# Patient Record
Sex: Female | Born: 1955
Health system: Southern US, Community
[De-identification: ages and names within clinical notes are randomized; demographics above are authoritative.]

## PROBLEM LIST (undated history)

## (undated) DIAGNOSIS — I341 Nonrheumatic mitral (valve) prolapse: Secondary | ICD-10-CM

## (undated) DIAGNOSIS — E079 Disorder of thyroid, unspecified: Secondary | ICD-10-CM

## (undated) DIAGNOSIS — M1990 Unspecified osteoarthritis, unspecified site: Secondary | ICD-10-CM

## (undated) DIAGNOSIS — Z298 Encounter for other specified prophylactic measures: Secondary | ICD-10-CM

## (undated) DIAGNOSIS — I351 Nonrheumatic aortic (valve) insufficiency: Secondary | ICD-10-CM

## (undated) DIAGNOSIS — G4730 Sleep apnea, unspecified: Secondary | ICD-10-CM

## (undated) DIAGNOSIS — R110 Nausea: Secondary | ICD-10-CM

## (undated) DIAGNOSIS — R112 Nausea with vomiting, unspecified: Secondary | ICD-10-CM

## (undated) DIAGNOSIS — G5600 Carpal tunnel syndrome, unspecified upper limb: Secondary | ICD-10-CM

## (undated) DIAGNOSIS — I340 Nonrheumatic mitral (valve) insufficiency: Secondary | ICD-10-CM

## (undated) DIAGNOSIS — L0591 Pilonidal cyst without abscess: Secondary | ICD-10-CM

## (undated) DIAGNOSIS — N189 Chronic kidney disease, unspecified: Secondary | ICD-10-CM

## (undated) DIAGNOSIS — H269 Unspecified cataract: Secondary | ICD-10-CM

## (undated) DIAGNOSIS — Z9885 Transplanted organ removal status: Secondary | ICD-10-CM

## (undated) DIAGNOSIS — G43909 Migraine, unspecified, not intractable, without status migrainosus: Secondary | ICD-10-CM

## (undated) DIAGNOSIS — C801 Malignant (primary) neoplasm, unspecified: Secondary | ICD-10-CM

## (undated) DIAGNOSIS — E785 Hyperlipidemia, unspecified: Secondary | ICD-10-CM

## (undated) DIAGNOSIS — F419 Anxiety disorder, unspecified: Secondary | ICD-10-CM

## (undated) DIAGNOSIS — T7840XA Allergy, unspecified, initial encounter: Secondary | ICD-10-CM

## (undated) DIAGNOSIS — R011 Cardiac murmur, unspecified: Secondary | ICD-10-CM

## (undated) DIAGNOSIS — S83249A Other tear of medial meniscus, current injury, unspecified knee, initial encounter: Secondary | ICD-10-CM

## (undated) DIAGNOSIS — K219 Gastro-esophageal reflux disease without esophagitis: Secondary | ICD-10-CM

## (undated) DIAGNOSIS — Z2989 Encounter for other specified prophylactic measures: Secondary | ICD-10-CM

## (undated) DIAGNOSIS — J45909 Unspecified asthma, uncomplicated: Secondary | ICD-10-CM

## (undated) DIAGNOSIS — E039 Hypothyroidism, unspecified: Secondary | ICD-10-CM

## (undated) DIAGNOSIS — I499 Cardiac arrhythmia, unspecified: Secondary | ICD-10-CM

## (undated) DIAGNOSIS — Z9889 Other specified postprocedural states: Secondary | ICD-10-CM

## (undated) DIAGNOSIS — I10 Essential (primary) hypertension: Secondary | ICD-10-CM

## (undated) DIAGNOSIS — J189 Pneumonia, unspecified organism: Secondary | ICD-10-CM

## (undated) HISTORY — DX: Malignant (primary) neoplasm, unspecified: C80.1

## (undated) HISTORY — DX: Essential (primary) hypertension: I10

## (undated) HISTORY — DX: Unspecified asthma, uncomplicated: J45.909

## (undated) HISTORY — DX: Disorder of thyroid, unspecified: E07.9

## (undated) HISTORY — DX: Pilonidal cyst without abscess: L05.91

## (undated) HISTORY — PX: ABDOMINAL HYSTERECTOMY: SHX81

## (undated) HISTORY — DX: Gastro-esophageal reflux disease without esophagitis: K21.9

## (undated) HISTORY — PX: KNEE SURGERY: SHX244

## (undated) HISTORY — PX: JOINT REPLACEMENT: SHX530

## (undated) HISTORY — DX: Hyperlipidemia, unspecified: E78.5

## (undated) HISTORY — DX: Migraine, unspecified, not intractable, without status migrainosus: G43.909

## (undated) HISTORY — DX: Encounter for other specified prophylactic measures: Z29.8

## (undated) HISTORY — DX: Nonrheumatic mitral (valve) prolapse: I34.1

## (undated) HISTORY — PX: SPINE SURGERY: SHX786

## (undated) HISTORY — PX: CARPAL TUNNEL RELEASE: SHX101

## (undated) HISTORY — DX: Allergy, unspecified, initial encounter: T78.40XA

## (undated) HISTORY — PX: TONSILLECTOMY: SUR1361

## (undated) HISTORY — DX: Unspecified cataract: H26.9

## (undated) HISTORY — DX: Nausea: R11.0

## (undated) HISTORY — DX: Cardiac murmur, unspecified: R01.1

## (undated) HISTORY — PX: NEPHRECTOMY: SHX65

## (undated) HISTORY — DX: Nonrheumatic mitral (valve) insufficiency: I34.0

## (undated) HISTORY — PX: TUBAL LIGATION: SHX77

## (undated) HISTORY — DX: Nonrheumatic aortic (valve) insufficiency: I35.1

## (undated) HISTORY — DX: Encounter for other specified prophylactic measures: Z29.89

## (undated) HISTORY — PX: COLONOSCOPY: SHX174

## (undated) HISTORY — PX: WISDOM TOOTH EXTRACTION: SHX21

---

## 1996-08-10 HISTORY — PX: CARPAL TUNNEL RELEASE: SHX101

## 2005-04-11 ENCOUNTER — Emergency Department (HOSPITAL_COMMUNITY): Admission: EM | Admit: 2005-04-11 | Discharge: 2005-04-11 | Payer: Self-pay | Admitting: Emergency Medicine

## 2005-04-12 ENCOUNTER — Ambulatory Visit (HOSPITAL_COMMUNITY): Admission: RE | Admit: 2005-04-12 | Discharge: 2005-04-12 | Payer: Self-pay | Admitting: Emergency Medicine

## 2005-04-12 ENCOUNTER — Emergency Department (HOSPITAL_COMMUNITY): Admission: EM | Admit: 2005-04-12 | Discharge: 2005-04-12 | Payer: Self-pay | Admitting: Emergency Medicine

## 2005-04-27 ENCOUNTER — Encounter: Admission: RE | Admit: 2005-04-27 | Discharge: 2005-06-23 | Payer: Self-pay | Admitting: Orthopedic Surgery

## 2006-12-23 ENCOUNTER — Emergency Department (HOSPITAL_COMMUNITY): Admission: EM | Admit: 2006-12-23 | Discharge: 2006-12-23 | Payer: Self-pay | Admitting: Emergency Medicine

## 2010-08-10 LAB — HM DEXA SCAN: HM Dexa Scan: NORMAL

## 2011-10-20 ENCOUNTER — Other Ambulatory Visit (HOSPITAL_BASED_OUTPATIENT_CLINIC_OR_DEPARTMENT_OTHER): Payer: Self-pay | Admitting: Family Medicine

## 2011-10-20 ENCOUNTER — Ambulatory Visit (HOSPITAL_BASED_OUTPATIENT_CLINIC_OR_DEPARTMENT_OTHER)
Admission: RE | Admit: 2011-10-20 | Discharge: 2011-10-20 | Disposition: A | Discharge status: Home / Self Care | Source: Ambulatory Visit | Attending: Family Medicine | Admitting: Family Medicine

## 2011-10-20 DIAGNOSIS — M25469 Effusion, unspecified knee: Secondary | ICD-10-CM | POA: Insufficient documentation

## 2011-10-20 DIAGNOSIS — S838X9A Sprain of other specified parts of unspecified knee, initial encounter: Secondary | ICD-10-CM

## 2011-10-20 DIAGNOSIS — M25569 Pain in unspecified knee: Secondary | ICD-10-CM | POA: Insufficient documentation

## 2011-10-20 DIAGNOSIS — M1710 Unilateral primary osteoarthritis, unspecified knee: Secondary | ICD-10-CM

## 2011-10-20 DIAGNOSIS — IMO0002 Reserved for concepts with insufficient information to code with codable children: Secondary | ICD-10-CM

## 2011-10-20 DIAGNOSIS — S86819A Strain of other muscle(s) and tendon(s) at lower leg level, unspecified leg, initial encounter: Secondary | ICD-10-CM

## 2011-10-20 DIAGNOSIS — R52 Pain, unspecified: Secondary | ICD-10-CM

## 2011-10-20 DIAGNOSIS — R937 Abnormal findings on diagnostic imaging of other parts of musculoskeletal system: Secondary | ICD-10-CM | POA: Insufficient documentation

## 2011-10-20 DIAGNOSIS — M25669 Stiffness of unspecified knee, not elsewhere classified: Secondary | ICD-10-CM | POA: Insufficient documentation

## 2012-07-14 ENCOUNTER — Ambulatory Visit (INDEPENDENT_AMBULATORY_CARE_PROVIDER_SITE_OTHER): Payer: 59 | Admitting: Internal Medicine

## 2012-07-14 ENCOUNTER — Encounter: Payer: Self-pay | Admitting: Internal Medicine

## 2012-07-14 VITALS — BP 112/67 | HR 68 | Temp 97.0°F | Resp 18 | Wt 198.0 lb

## 2012-07-14 DIAGNOSIS — J309 Allergic rhinitis, unspecified: Secondary | ICD-10-CM

## 2012-07-14 DIAGNOSIS — I059 Rheumatic mitral valve disease, unspecified: Secondary | ICD-10-CM

## 2012-07-14 DIAGNOSIS — N390 Urinary tract infection, site not specified: Secondary | ICD-10-CM

## 2012-07-14 DIAGNOSIS — Z90710 Acquired absence of both cervix and uterus: Secondary | ICD-10-CM

## 2012-07-14 DIAGNOSIS — I340 Nonrheumatic mitral (valve) insufficiency: Secondary | ICD-10-CM

## 2012-07-14 DIAGNOSIS — K219 Gastro-esophageal reflux disease without esophagitis: Secondary | ICD-10-CM

## 2012-07-14 DIAGNOSIS — E039 Hypothyroidism, unspecified: Secondary | ICD-10-CM

## 2012-07-14 DIAGNOSIS — E785 Hyperlipidemia, unspecified: Secondary | ICD-10-CM

## 2012-07-14 DIAGNOSIS — I341 Nonrheumatic mitral (valve) prolapse: Secondary | ICD-10-CM | POA: Insufficient documentation

## 2012-07-14 LAB — POCT URINALYSIS DIPSTICK
Bilirubin, UA: NEGATIVE
Ketones, UA: NEGATIVE
Protein, UA: NEGATIVE
Spec Grav, UA: 1.02
pH, UA: 5

## 2012-07-14 MED ORDER — CIPROFLOXACIN HCL 500 MG PO TABS: 500.0000 mg | ORAL_TABLET | Freq: Two times a day (BID) | ORAL | Status: DC

## 2012-07-14 MED ORDER — CEFTRIAXONE SODIUM 1 G IJ SOLR
1.0000 g | INTRAMUSCULAR | Status: AC
  Administered 2012-07-14: 1 g via INTRAMUSCULAR

## 2012-07-14 MED ORDER — CEFTRIAXONE SODIUM 1 G IJ SOLR: 1.0000 g | Freq: Once | INTRAMUSCULAR | Status: DC

## 2012-07-14 NOTE — Patient Instructions (Addendum)
Call to see Dr. Alberteen Sam when antibiotics complete    Activate my chart

## 2012-07-14 NOTE — Progress Notes (Signed)
  Subjective:    Patient ID: [name], female    DOB: 1955-09-16, 56 y.o.   MRN: [id number]  HPI New pt here for acute visit.  Mimi Is a pt of Dr. Camie Patience  She has recently been treated for an E.Coli UTI sensitive to Cipro.  Denece had a 7 day course. She now has dysuria and urgency again.  No CVA pain, N/V  or fever.  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  Review of Systems    see HPI Objective:   Physical Exam Physical Exam  Nursing note and vitals reviewed.  Constitutional: She is oriented to person, place, and time. She appears well-developed and well-nourished.  HENT:  Head: Normocephalic and atraumatic.  Cardiovascular: Normal rate and regular rhythm. Exam reveals no gallop and no friction rub.  No murmur heard.  Pulmonary/Chest: Breath sounds normal. She has no wheezes. She has no rales.  Abd  No CVA tenderness over R kidney Neurological: She is alert and oriented to person, place, and time.  Skin: Skin is warm and dry.  Psychiatric: She has a normal mood and affect. Her behavior is normal.              Assessment & Plan:  UTI  Will send for culture today.  Rocephin 1 gm given in office and will give Cipro 500 mg for 2 week course  Advised to get chemistries in am that were previously ordered  Pt counseled to see Dr. Alberteen Sam when she finishes antibiotics.  May need to consider urologic eval if UTI's current.

## 2012-07-18 ENCOUNTER — Other Ambulatory Visit: Payer: Self-pay | Admitting: Internal Medicine

## 2012-07-20 LAB — URINE CULTURE

## 2012-07-21 ENCOUNTER — Telehealth: Payer: Self-pay | Admitting: Internal Medicine

## 2012-07-21 NOTE — Telephone Encounter (Signed)
See sensitivities.  Urine culture 50,000 ecoli resistant to cipro.  She had rocephin in office  [name]   Dr. Camie Patience nurse will call pt to have her come in for another urine culture  I spoke with Dr. Alberteen Sam and she is aware of culture and sensitivity results

## 2012-08-14 DIAGNOSIS — J189 Pneumonia, unspecified organism: Secondary | ICD-10-CM

## 2012-08-14 HISTORY — DX: Pneumonia, unspecified organism: J18.9

## 2012-11-18 ENCOUNTER — Other Ambulatory Visit: Payer: Self-pay | Admitting: Family Medicine

## 2012-11-23 ENCOUNTER — Other Ambulatory Visit: Payer: Self-pay | Admitting: *Deleted

## 2012-11-23 MED ORDER — TRIAMTERENE-HCTZ 37.5-25 MG PO TABS: 0.5000 | ORAL_TABLET | Freq: Every day | ORAL | Status: DC

## 2013-01-04 ENCOUNTER — Emergency Department (HOSPITAL_BASED_OUTPATIENT_CLINIC_OR_DEPARTMENT_OTHER)
Admission: EM | Admit: 2013-01-04 | Discharge: 2013-01-04 | Disposition: A | Discharge status: Home / Self Care | Payer: 59 | Attending: Emergency Medicine | Admitting: Emergency Medicine

## 2013-01-04 ENCOUNTER — Encounter (HOSPITAL_BASED_OUTPATIENT_CLINIC_OR_DEPARTMENT_OTHER): Payer: Self-pay | Admitting: *Deleted

## 2013-01-04 ENCOUNTER — Emergency Department (HOSPITAL_BASED_OUTPATIENT_CLINIC_OR_DEPARTMENT_OTHER): Payer: 59

## 2013-01-04 DIAGNOSIS — Z8679 Personal history of other diseases of the circulatory system: Secondary | ICD-10-CM | POA: Insufficient documentation

## 2013-01-04 DIAGNOSIS — K219 Gastro-esophageal reflux disease without esophagitis: Secondary | ICD-10-CM | POA: Insufficient documentation

## 2013-01-04 DIAGNOSIS — Z8639 Personal history of other endocrine, nutritional and metabolic disease: Secondary | ICD-10-CM | POA: Insufficient documentation

## 2013-01-04 DIAGNOSIS — M19079 Primary osteoarthritis, unspecified ankle and foot: Secondary | ICD-10-CM | POA: Insufficient documentation

## 2013-01-04 DIAGNOSIS — E785 Hyperlipidemia, unspecified: Secondary | ICD-10-CM | POA: Insufficient documentation

## 2013-01-04 DIAGNOSIS — Z79899 Other long term (current) drug therapy: Secondary | ICD-10-CM | POA: Insufficient documentation

## 2013-01-04 DIAGNOSIS — M1990 Unspecified osteoarthritis, unspecified site: Secondary | ICD-10-CM

## 2013-01-04 DIAGNOSIS — E079 Disorder of thyroid, unspecified: Secondary | ICD-10-CM | POA: Insufficient documentation

## 2013-01-04 DIAGNOSIS — Z862 Personal history of diseases of the blood and blood-forming organs and certain disorders involving the immune mechanism: Secondary | ICD-10-CM | POA: Insufficient documentation

## 2013-01-04 NOTE — ED Provider Notes (Signed)
Medical screening examination/treatment/procedure(s) were performed by non-physician practitioner and as supervising physician I was immediately available for consultation/collaboration.   Glynn Octave, MD 01/04/13 [phone]

## 2013-01-04 NOTE — ED Notes (Signed)
Pt c/o right foot pain x 4 days denies injury

## 2013-01-04 NOTE — ED Provider Notes (Signed)
History     CSN: [id number]  Arrival date & time 01/04/13  4098   First MD Initiated Contact with Patient 01/04/13 1950      Chief Complaint  Patient presents with  . Foot Pain    (Consider location/radiation/quality/duration/timing/severity/associated sxs/prior treatment) HPI Comments: Pt states that she has been hurting to the top of her foot between her first and second metatarsals:no injury  Patient is a 57 y.o. female presenting with lower extremity pain. The history is provided by the patient. No language interpreter was used.  Foot Pain This is a new problem. The current episode started in the past 7 days. The problem occurs constantly. The problem has been unchanged. The symptoms are aggravated by walking. She has tried nothing for the symptoms.    Past Medical History  Diagnosis Date  . Thyroid disease   . Hyperlipidemia   . Allergy   . GERD (gastroesophageal reflux disease)   . Mitral valve prolapse   . Graves disease     Past Surgical History  Procedure Laterality Date  . Tonsillectomy    . Nephrectomy    . Abdominal hysterectomy      History reviewed. No pertinent family history.  History  Substance Use Topics  . Smoking status: Never Smoker   . Smokeless tobacco: Not on file  . Alcohol Use:     OB History   Grav Para Term Preterm Abortions TAB SAB Ect Mult Living                  Review of Systems  Constitutional: Negative.   Respiratory: Negative.     Allergies  Morphine and related and Percocet  Home Medications   Current Outpatient Rx  Name  Route  Sig  Dispense  Refill  . albuterol (PROVENTIL HFA;VENTOLIN HFA) 108 (90 BASE) MCG/ACT inhaler   Inhalation   Inhale 2 puffs into the lungs every 6 (six) hours as needed.         Marland Kitchen almotriptan (AXERT) 12.5 MG tablet   Oral   Take 12.5 mg by mouth as needed. may repeat in 2 hours if needed         . ciprofloxacin (CIPRO) 500 MG tablet   Oral   Take 1 tablet (500 mg total) by  mouth 2 (two) times daily.   28 tablet   0   . esomeprazole (NEXIUM) 40 MG capsule   Oral   Take 40 mg by mouth daily before breakfast.         . ezetimibe (ZETIA) 10 MG tablet   Oral   Take 10 mg by mouth daily.         Marland Kitchen levothyroxine (SYNTHROID, LEVOTHROID) 150 MCG tablet   Oral   Take 150 mcg by mouth daily.         Marland Kitchen rOPINIRole (REQUIP) 1 MG tablet   Oral   Take 1 mg by mouth 3 (three) times daily.         . rosuvastatin (CRESTOR) 10 MG tablet   Oral   Take 10 mg by mouth daily.         Marland Kitchen triamterene-hydrochlorothiazide (MAXZIDE-25) 37.5-25 MG per tablet   Oral   Take 0.5 each (0.5 tablets total) by mouth daily.   90 tablet   0   . Vitamin D, Ergocalciferol, (DRISDOL) 50000 UNITS CAPS   Oral   Take 50,000 Units by mouth.         . zolpidem (AMBIEN) 5 MG tablet  Oral   Take 5 mg by mouth at bedtime as needed.           BP 124/75  Pulse 84  Temp(Src) 98.2 F (36.8 C) (Oral)  Resp 16  Ht 5\' 4"  (1.626 m)  Wt 195 lb (88.451 kg)  BMI 33.46 kg/m2  SpO2 100%  Physical Exam  Nursing note and vitals reviewed. Constitutional: She is oriented to person, place, and time. She appears well-developed and well-nourished.  Cardiovascular: Normal rate and regular rhythm.   Pulmonary/Chest: Effort normal and breath sounds normal.  Musculoskeletal: Normal range of motion.  No swelling or deformity noted  Neurological: She is oriented to person, place, and time.  Pt tender between first and second metatarsal  Skin: Skin is warm and dry.    ED Course  Procedures (including critical care time)  Labs Reviewed - No data to display Dg Foot Complete Right  01/04/2013   *RADIOLOGY REPORT*  Clinical Data: Right foot pain  RIGHT FOOT COMPLETE - 3+ VIEW  Comparison: None.  Findings: There is osteophytosis at the base of the first and second metatarsals.  No evidence of fracture or dislocation.  There is a degenerative spurring of the calcaneus at the plantar and  Achilles aspect.  IMPRESSION:  1.  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 2.  Enthesopathic change of the calcaneus.   Original Report Authenticated By: Genevive Bi, M.D.     1. Arthritis       MDM  No splinting necessary:pt refusing medications:likely arthritis related        Teressa Lower, NP 01/04/13 2043

## 2013-01-19 ENCOUNTER — Other Ambulatory Visit: Payer: Self-pay | Admitting: Family Medicine

## 2013-01-19 MED ORDER — TRIAMTERENE-HCTZ 37.5-25 MG PO TABS: 0.5000 | ORAL_TABLET | Freq: Every day | ORAL | Status: DC

## 2013-02-02 ENCOUNTER — Other Ambulatory Visit: Payer: Self-pay | Admitting: Family Medicine

## 2013-02-02 NOTE — Telephone Encounter (Signed)
Jasmin, can you call Vara?  She needs to be scheduled for labs and see Dr Herma Carson.  I've been refilling all her meds but she is going to run out again soon.  Thanks PG

## 2013-02-02 NOTE — Telephone Encounter (Signed)
Spoke with patient and she will call back to schedule the appointments.

## 2013-02-21 ENCOUNTER — Other Ambulatory Visit: Payer: Self-pay

## 2013-02-21 ENCOUNTER — Encounter: Payer: Self-pay | Admitting: Internal Medicine

## 2013-02-21 DIAGNOSIS — Z1231 Encounter for screening mammogram for malignant neoplasm of breast: Secondary | ICD-10-CM

## 2013-03-01 ENCOUNTER — Other Ambulatory Visit: Payer: Self-pay | Admitting: *Deleted

## 2013-03-01 DIAGNOSIS — R5383 Other fatigue: Secondary | ICD-10-CM

## 2013-03-01 DIAGNOSIS — R5381 Other malaise: Secondary | ICD-10-CM

## 2013-03-01 DIAGNOSIS — E785 Hyperlipidemia, unspecified: Secondary | ICD-10-CM

## 2013-03-01 DIAGNOSIS — E039 Hypothyroidism, unspecified: Secondary | ICD-10-CM

## 2013-03-02 ENCOUNTER — Other Ambulatory Visit: Payer: 59

## 2013-03-06 ENCOUNTER — Other Ambulatory Visit: Payer: 59

## 2013-03-06 LAB — CBC WITH DIFFERENTIAL/PLATELET
Basophils Absolute: 0.1 10*3/uL (ref 0.0–0.1)
Basophils Relative: 1 % (ref 0–1)
Eosinophils Absolute: 0.3 10*3/uL (ref 0.0–0.7)
Eosinophils Relative: 5 % (ref 0–5)
HCT: 38 % (ref 36.0–46.0)
Hemoglobin: 13 g/dL (ref 12.0–15.0)
Lymphocytes Relative: 33 % (ref 12–46)
Lymphs Abs: 1.6 10*3/uL (ref 0.7–4.0)
MCH: 28.6 pg (ref 26.0–34.0)
MCHC: 34.2 g/dL (ref 30.0–36.0)
MCV: 83.7 fL (ref 78.0–100.0)
Monocytes Absolute: 0.4 10*3/uL (ref 0.1–1.0)
Monocytes Relative: 9 % (ref 3–12)
Neutro Abs: 2.5 10*3/uL (ref 1.7–7.7)
Neutrophils Relative %: 52 % (ref 43–77)
Platelets: 343 10*3/uL (ref 150–400)
RBC: 4.54 MIL/uL (ref 3.87–5.11)
RDW: 14.8 % (ref 11.5–15.5)
WBC: 4.9 10*3/uL (ref 4.0–10.5)

## 2013-03-06 LAB — COMPLETE METABOLIC PANEL WITH GFR
ALT: 23 U/L (ref 0–35)
AST: 18 U/L (ref 0–37)
Albumin: 4.1 g/dL (ref 3.5–5.2)
Alkaline Phosphatase: 64 U/L (ref 39–117)
BUN: 21 mg/dL (ref 6–23)
CO2: 29 mEq/L (ref 19–32)
Calcium: 9.6 mg/dL (ref 8.4–10.5)
Chloride: 105 mEq/L (ref 96–112)
Creat: 1.31 mg/dL — ABNORMAL HIGH (ref 0.50–1.10)
GFR, Est African American: 52 mL/min — ABNORMAL LOW
GFR, Est Non African American: 45 mL/min — ABNORMAL LOW
Glucose, Bld: 91 mg/dL (ref 70–99)
Potassium: 4.6 mEq/L (ref 3.5–5.3)
Sodium: 142 mEq/L (ref 135–145)
Total Bilirubin: 0.6 mg/dL (ref 0.3–1.2)
Total Protein: 6.4 g/dL (ref 6.0–8.3)

## 2013-03-06 LAB — LIPID PANEL
Cholesterol: 235 mg/dL — ABNORMAL HIGH (ref 0–200)
HDL: 49 mg/dL (ref >39)
LDL Cholesterol: 137 mg/dL — ABNORMAL HIGH (ref 0–99)
Total CHOL/HDL Ratio: 4.8 Ratio
Triglycerides: 243 mg/dL — ABNORMAL HIGH (ref <150)
VLDL: 49 mg/dL — ABNORMAL HIGH (ref 0–40)

## 2013-03-06 LAB — TSH: TSH: 0.977 u[IU]/mL (ref 0.350–4.500)

## 2013-03-06 LAB — T4, FREE: Free T4: 1.48 ng/dL (ref 0.80–1.80)

## 2013-03-07 LAB — VITAMIN D 25 HYDROXY (VIT D DEFICIENCY, FRACTURES): Vit D, 25-Hydroxy: 54 ng/mL (ref 30–89)

## 2013-03-10 ENCOUNTER — Ambulatory Visit (INDEPENDENT_AMBULATORY_CARE_PROVIDER_SITE_OTHER): Payer: 59 | Admitting: Family Medicine

## 2013-03-10 ENCOUNTER — Encounter: Payer: Self-pay | Admitting: Family Medicine

## 2013-03-10 VITALS — BP 111/77 | HR 76 | Wt 199.0 lb

## 2013-03-10 DIAGNOSIS — E039 Hypothyroidism, unspecified: Secondary | ICD-10-CM

## 2013-03-10 DIAGNOSIS — G4700 Insomnia, unspecified: Secondary | ICD-10-CM

## 2013-03-10 DIAGNOSIS — F411 Generalized anxiety disorder: Secondary | ICD-10-CM

## 2013-03-10 DIAGNOSIS — R062 Wheezing: Secondary | ICD-10-CM

## 2013-03-10 DIAGNOSIS — E559 Vitamin D deficiency, unspecified: Secondary | ICD-10-CM

## 2013-03-10 DIAGNOSIS — I10 Essential (primary) hypertension: Secondary | ICD-10-CM

## 2013-03-10 DIAGNOSIS — E785 Hyperlipidemia, unspecified: Secondary | ICD-10-CM

## 2013-03-10 DIAGNOSIS — G43909 Migraine, unspecified, not intractable, without status migrainosus: Secondary | ICD-10-CM

## 2013-03-10 MED ORDER — LEVOTHYROXINE SODIUM 150 MCG PO TABS: 150.0000 ug | ORAL_TABLET | Freq: Every day | ORAL | Status: DC

## 2013-03-10 MED ORDER — EZETIMIBE 10 MG PO TABS: 10.0000 mg | ORAL_TABLET | Freq: Every day | ORAL | Status: DC

## 2013-03-10 MED ORDER — ROSUVASTATIN CALCIUM 20 MG PO TABS: ORAL_TABLET | ORAL | Status: DC

## 2013-03-10 MED ORDER — TRIAMTERENE-HCTZ 37.5-25 MG PO TABS: 0.5000 | ORAL_TABLET | Freq: Every day | ORAL | Status: DC

## 2013-03-10 MED ORDER — CITALOPRAM HYDROBROMIDE 20 MG PO TABS: 20.0000 mg | ORAL_TABLET | Freq: Every day | ORAL | Status: DC

## 2013-03-10 MED ORDER — ALBUTEROL SULFATE HFA 108 (90 BASE) MCG/ACT IN AERS: 2.0000 | INHALATION_SPRAY | Freq: Four times a day (QID) | RESPIRATORY_TRACT | Status: DC | PRN

## 2013-03-10 MED ORDER — ALMOTRIPTAN MALATE 12.5 MG PO TABS: 12.5000 mg | ORAL_TABLET | ORAL | Status: DC | PRN

## 2013-03-10 MED ORDER — PROGESTERONE MICRONIZED 200 MG PO CAPS: 200.0000 mg | ORAL_CAPSULE | Freq: Every day | ORAL | Status: DC

## 2013-03-10 MED ORDER — VITAMIN D (ERGOCALCIFEROL) 1.25 MG (50000 UNIT) PO CAPS: ORAL_CAPSULE | ORAL | Status: DC

## 2013-03-10 NOTE — Patient Instructions (Addendum)
1)  Insomnia - Try the Prometrium 200 mg at night, limit your caffeine, increase your exercise, Herbal Tea (Sweet Dream/Sleepy Time), Hot Bath.     Insomnia Insomnia is frequent trouble falling and/or staying asleep. Insomnia can be a long term problem or a short term problem. Both are common. Insomnia can be a short term problem when the wakefulness is related to a certain stress or worry. Long term insomnia is often related to ongoing stress during waking hours and/or poor sleeping habits. Overtime, sleep deprivation itself can make the problem worse. Every little thing feels more severe because you are overtired and your ability to cope is decreased. CAUSES   Stress, anxiety, and depression.  Poor sleeping habits.  Distractions such as TV in the bedroom.  Naps close to bedtime.  Engaging in emotionally charged conversations before bed.  Technical reading before sleep.  Alcohol and other sedatives. They may make the problem worse. They can hurt normal sleep patterns and normal dream activity.  Stimulants such as caffeine for several hours prior to bedtime.  Pain syndromes and shortness of breath can cause insomnia.  Exercise late at night.  Changing time zones may cause sleeping problems (jet lag). It is sometimes helpful to have someone observe your sleeping patterns. They should look for periods of not breathing during the night (sleep apnea). They should also look to see how long those periods last. If you live alone or observers are uncertain, you can also be observed at a sleep clinic where your sleep patterns will be professionally monitored. Sleep apnea requires a checkup and treatment. Give your caregivers your medical history. Give your caregivers observations your family has made about your sleep.  SYMPTOMS   Not feeling rested in the morning.  Anxiety and restlessness at bedtime.  Difficulty falling and staying asleep. TREATMENT   Your caregiver may prescribe  treatment for an underlying medical disorders. Your caregiver can give advice or help if you are using alcohol or other drugs for self-medication. Treatment of underlying problems will usually eliminate insomnia problems.  Medications can be prescribed for short time use. They are generally not recommended for lengthy use.  Over-the-counter sleep medicines are not recommended for lengthy use. They can be habit forming.  You can promote easier sleeping by making lifestyle changes such as:  Using relaxation techniques that help with breathing and reduce muscle tension.  Exercising earlier in the day.  Changing your diet and the time of your last meal. No night time snacks.  Establish a regular time to go to bed.  Counseling can help with stressful problems and worry.  Soothing music and white noise may be helpful if there are background noises you cannot remove.  Stop tedious detailed work at least one hour before bedtime. HOME CARE INSTRUCTIONS   Keep a diary. Inform your caregiver about your progress. This includes any medication side effects. See your caregiver regularly. Take note of:  Times when you are asleep.  Times when you are awake during the night.  The quality of your sleep.  How you feel the next day. This information will help your caregiver care for you.  Get out of bed if you are still awake after 15 minutes. Read or do some quiet activity. Keep the lights down. Wait until you feel sleepy and go back to bed.  Keep regular sleeping and waking hours. Avoid naps.  Exercise regularly.  Avoid distractions at bedtime. Distractions include watching television or engaging in any intense or detailed  activity like attempting to balance the household checkbook.  Develop a bedtime ritual. Keep a familiar routine of bathing, brushing your teeth, climbing into bed at the same time each night, listening to soothing music. Routines increase the success of falling to sleep  faster.  Use relaxation techniques. This can be using breathing and muscle tension release routines. It can also include visualizing peaceful scenes. You can also help control troubling or intruding thoughts by keeping your mind occupied with boring or repetitive thoughts like the old concept of counting sheep. You can make it more creative like imagining planting one beautiful flower after another in your backyard garden.  During your day, work to eliminate stress. When this is not possible use some of the previous suggestions to help reduce the anxiety that accompanies stressful situations. MAKE SURE YOU:   Understand these instructions.  Will watch your condition.  Will get help right away if you are not doing well or get worse. Document Released: 07/24/2000 Document Revised: 10/19/2011 Document Reviewed: 08/24/2007 [facility] Patient Information 2014 [place], [place].

## 2013-03-10 NOTE — Progress Notes (Signed)
Subjective:    Patient ID: [name], female    DOB: Mar 12, 1956, 57 y.o.   MRN: [id number]  HPI  Aylssa is here today to go over her most recent lab results, get medication refill and to discuss the conditions listed below.     1)  Hyperlipidemia:  She has been taking only Crestor for her elevated cholesterol.  She would like to get back to her Zetia and Lovaza.    2)  Hypertension:  Her blood pressure is well controlled on Triamterene/HCTZ.   3)  Insomina:  She continues to struggle with her sleep.  She has trouble with both falling and staying asleep.  She took Ambien for a short time after her mother's death last year.  She says that she wakes up feeling very fatigued and is very tired during the day.     4)  Migraine Headaches - She needs to have her Axert refilled.   5)  Wheezing:  She needs to also have her inhaler refilled.     Review of Systems  Constitutional: Positive for fatigue.  HENT: Negative.   Eyes: Negative.   Respiratory: Negative.   Cardiovascular: Negative.   Gastrointestinal: Negative.   Endocrine: Negative.   Genitourinary: Negative.   Allergic/Immunologic: Negative.   Neurological: Negative.   Hematological: Negative.   Psychiatric/Behavioral: Positive for sleep disturbance.    Past Medical History  Diagnosis Date  . Thyroid disease   . Hyperlipidemia   . Allergy   . GERD (gastroesophageal reflux disease)   . Mitral valve prolapse   . Graves disease   . Aortic insufficiency     Mild  . Mitral regurgitation   . Need for SBE (subacute bacterial endocarditis) prophylaxis   . Graves disease   . Migraine headache   . Pilonidal cyst without mention of abscess   . Nausea alone     Family History  Problem Relation Age of Onset  . Diabetes Mother   . Hypertension Mother   . Heart disease Father   . Hypertension Father   . Thyroid disease Father   . Cancer Maternal Aunt   . Cancer Maternal Aunt     History   Social History Narrative    Marital Status:  Married Merchant navy officer)   Children: 4   Pets:  Dogs (2)   Living Situation: Lives with spouse, son and his family.     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 Constitutional: She is oriented to person, place, and time.  Eyes: Conjunctivae are normal. No scleral icterus.  Neck: Neck supple. No thyromegaly present.  Cardiovascular: Normal rate, regular rhythm and normal heart sounds.   Pulmonary/Chest: Effort normal and breath sounds normal.  Musculoskeletal: She exhibits no edema and no tenderness.  Lymphadenopathy:    She has no cervical adenopathy.  Neurological: She is alert and oriented to person, place, and time.  Skin: Skin is warm and dry.  Psychiatric: She has a normal mood and affect. Her behavior is normal. Judgment and thought content normal.          Assessment &  Plan:

## 2013-03-20 ENCOUNTER — Ambulatory Visit
Admission: RE | Admit: 2013-03-20 | Discharge: 2013-03-20 | Disposition: A | Discharge status: Home / Self Care | Payer: 59 | Source: Ambulatory Visit

## 2013-03-20 DIAGNOSIS — Z1231 Encounter for screening mammogram for malignant neoplasm of breast: Secondary | ICD-10-CM

## 2013-04-25 ENCOUNTER — Other Ambulatory Visit: Payer: Self-pay | Admitting: Family Medicine

## 2013-04-28 ENCOUNTER — Ambulatory Visit (AMBULATORY_SURGERY_CENTER): Payer: Self-pay | Admitting: *Deleted

## 2013-04-28 VITALS — Ht 65.0 in | Wt 201.2 lb

## 2013-04-28 DIAGNOSIS — Z1211 Encounter for screening for malignant neoplasm of colon: Secondary | ICD-10-CM

## 2013-04-28 MED ORDER — NA SULFATE-K SULFATE-MG SULF 17.5-3.13-1.6 GM/177ML PO SOLN: 1.0000 | Freq: Once | ORAL | Status: DC

## 2013-04-28 NOTE — Progress Notes (Signed)
No egg or soy allergies 

## 2013-04-29 DIAGNOSIS — I10 Essential (primary) hypertension: Secondary | ICD-10-CM | POA: Insufficient documentation

## 2013-04-29 DIAGNOSIS — F411 Generalized anxiety disorder: Secondary | ICD-10-CM | POA: Insufficient documentation

## 2013-04-29 DIAGNOSIS — E039 Hypothyroidism, unspecified: Secondary | ICD-10-CM | POA: Insufficient documentation

## 2013-04-29 DIAGNOSIS — E559 Vitamin D deficiency, unspecified: Secondary | ICD-10-CM | POA: Insufficient documentation

## 2013-04-29 DIAGNOSIS — R062 Wheezing: Secondary | ICD-10-CM | POA: Insufficient documentation

## 2013-04-29 DIAGNOSIS — G43909 Migraine, unspecified, not intractable, without status migrainosus: Secondary | ICD-10-CM | POA: Insufficient documentation

## 2013-04-29 DIAGNOSIS — G4700 Insomnia, unspecified: Secondary | ICD-10-CM | POA: Insufficient documentation

## 2013-04-29 NOTE — Assessment & Plan Note (Signed)
She was given refills for Crestor and Zetia.

## 2013-04-29 NOTE — Assessment & Plan Note (Signed)
Refilled her Axert.

## 2013-04-29 NOTE — Assessment & Plan Note (Addendum)
She will continue on Celexa to help with her mood and sleep.

## 2013-04-29 NOTE — Assessment & Plan Note (Addendum)
She is going to start on some Prometrium in addition to the Celexa to see if this will help her sleeping problem.

## 2013-04-29 NOTE — Assessment & Plan Note (Signed)
Refilled her levothyroxine 150 mcg.

## 2013-04-29 NOTE — Assessment & Plan Note (Signed)
Refilled her Vitamin D.   

## 2013-04-29 NOTE — Assessment & Plan Note (Signed)
She was given a prescription for an albuterol inhaler.

## 2013-04-29 NOTE — Assessment & Plan Note (Signed)
Refilled her Maxzide.  

## 2013-05-12 ENCOUNTER — Encounter: Payer: Self-pay | Admitting: Internal Medicine

## 2013-05-12 ENCOUNTER — Ambulatory Visit (AMBULATORY_SURGERY_CENTER): Payer: 59 | Admitting: Internal Medicine

## 2013-05-12 VITALS — BP 100/57 | HR 61 | Temp 97.6°F | Resp 19 | Ht 65.0 in | Wt 201.0 lb

## 2013-05-12 DIAGNOSIS — D126 Benign neoplasm of colon, unspecified: Secondary | ICD-10-CM

## 2013-05-12 DIAGNOSIS — Z1211 Encounter for screening for malignant neoplasm of colon: Secondary | ICD-10-CM

## 2013-05-12 MED ORDER — SODIUM CHLORIDE 0.9 % IV SOLN: 500.0000 mL | INTRAVENOUS | Status: DC

## 2013-05-12 NOTE — Progress Notes (Signed)
Patient did not have preoperative order for IV antibiotic SSI prophylaxis. (G8918)  Patient did not experience any of the following events: a burn prior to discharge; a fall within the facility; wrong site/side/patient/procedure/implant event; or a hospital transfer or hospital admission upon discharge from the facility. (G8907)  

## 2013-05-12 NOTE — Progress Notes (Signed)
Report to pacu rn, vss, bbs=clear 

## 2013-05-12 NOTE — Patient Instructions (Addendum)
I found and removed one possible polyp - everything else looked normal except for some hemorrhoidal tags. Great prep!  I will let you know pathology results and when to have another routine colonoscopy by mail.  I appreciate the opportunity to care for you. Iva Boop, MD, FACG  YOU HAD AN ENDOSCOPIC PROCEDURE TODAY AT THE [facility]: Refer to the procedure report that was given to you for any specific questions about what was found during the examination.  If the procedure report does not answer your questions, please call your gastroenterologist to clarify.  If you requested that your care partner not be given the details of your procedure findings, then the procedure report has been included in a sealed envelope for you to review at your convenience later.  YOU SHOULD EXPECT: Some feelings of bloating in the abdomen. Passage of more gas than usual.  Walking can help get rid of the air that was put into your GI tract during the procedure and reduce the bloating. If you had a lower endoscopy (such as a colonoscopy or flexible sigmoidoscopy) you may notice spotting of blood in your stool or on the toilet paper. If you underwent a bowel prep for your procedure, then you may not have a normal bowel movement for a few days.  DIET: Your first meal following the procedure should be a light meal and then it is ok to progress to your normal diet.  A half-sandwich or bowl of soup is an example of a good first meal.  Heavy or fried foods are harder to digest and may make you feel nauseous or bloated.  Likewise meals heavy in dairy and vegetables can cause extra gas to form and this can also increase the bloating.  Drink plenty of fluids but you should avoid alcoholic beverages for 24 hours.  ACTIVITY: Your care partner should take you home directly after the procedure.  You should plan to take it easy, moving slowly for the rest of the day.  You can resume normal activity the day after the  procedure however you should NOT DRIVE or use heavy machinery for 24 hours (because of the sedation medicines used during the test).    SYMPTOMS TO REPORT IMMEDIATELY: A gastroenterologist can be reached at any hour.  During normal business hours, 8:30 AM to 5:00 PM Monday through Friday, call [phone].  After hours and on weekends, please call the GI answering service at [phone] who will take a message and have the physician on call contact you.   Following lower endoscopy (colonoscopy or flexible sigmoidoscopy):  Excessive amounts of blood in the stool  Significant tenderness or worsening of abdominal pains  Swelling of the abdomen that is new, acute  Fever of 100F or higher   FOLLOW UP: If any biopsies were taken you will be contacted by phone or by letter within the next 1-3 weeks.  Call your gastroenterologist if you have not heard about the biopsies in 3 weeks.  Our staff will call the home number listed on your records the next business day following your procedure to check on you and address any questions or concerns that you may have at that time regarding the information given to you following your procedure. This is a courtesy call and so if there is no answer at the home number and we have not heard from you through the emergency physician on call, we will assume that you have returned to your regular daily activities  without incident.  SIGNATURES/CONFIDENTIALITY: You and/or your care partner have signed paperwork which will be entered into your electronic medical record.  These signatures attest to the fact that that the information above on your After Visit Summary has been reviewed and is understood.  Full responsibility of the confidentiality of this discharge information lies with you and/or your care-partner.

## 2013-05-12 NOTE — Op Note (Signed)
[facility] [address] [place], [address]   COLONOSCOPY PROCEDURE REPORT  PATIENT: Tanya, Buckley  MR#: [id number] BIRTHDATE: 01/09/1956 , 57  yrs. old GENDER: Female ENDOSCOPIST: Iva Boop, MD, [facility] REFERRED XB:JYNWG Zanard, M.D. PROCEDURE DATE:  05/12/2013 PROCEDURE:   Colonoscopy with snare polypectomy First Screening Colonoscopy - Avg.  risk and is 50 yrs.  old or older Yes.  Prior Negative Screening - Now for repeat screening. N/A  History of Adenoma - Now for follow-up colonoscopy & has been > or = to 3 yrs.  N/A  Polyps Removed Today? Yes. ASA CLASS:   Class II INDICATIONS:average risk screening and first colonoscopy. MEDICATIONS: Propofol (Diprivan) 290 mg IV, MAC sedation, administered by CRNA, and These medications were titrated to patient response per physician's verbal order  DESCRIPTION OF PROCEDURE:   After the risks benefits and alternatives of the procedure were thoroughly explained, informed consent was obtained.  A digital rectal exam revealed several skin tags and A digital rectal exam revealed no rectal mass.   The LB [id number]-[id number]  endoscope was introduced through the anus and advanced to the cecum, which was identified by both the appendix and ileocecal valve. No adverse events experienced.   The quality of the prep was excellent using Suprep  The instrument was then slowly withdrawn as the colon was fully examined.      COLON FINDINGS: A sessile polyp measuring 7 mm in size was found in the descending colon.  A polypectomy was performed with a cold snare.  The resection was complete and the polyp tissue was completely retrieved.   The colon mucosa was otherwise normal.   A right colon retroflexion was performed.  Retroflexed views revealed no abnormalities. The time to cecum=1 minutes 18 seconds. Withdrawal time=9 minutes 59 seconds.  The scope was withdrawn and the procedure completed. COMPLICATIONS: There were no  complications.  ENDOSCOPIC IMPRESSION: 1.   Sessile polyp measuring 7 mm in size was found in the descending colon; polypectomy was performed with a cold snare 2.   The colon mucosa was otherwise normal - excellent - first colonoscopy  RECOMMENDATIONS: Timing of repeat colonoscopy will be determined by pathology findings.   eSigned:  Iva Boop, MD, [facility] 05/12/2013 9:32 AM  cc: Birdena Jubilee MD and The Patient

## 2013-05-12 NOTE — Progress Notes (Signed)
Called to room to assist during endoscopic procedure.  Patient ID and intended procedure confirmed with present staff. Received instructions for my participation in the procedure from the performing physician.  

## 2013-05-15 ENCOUNTER — Telehealth: Payer: Self-pay | Admitting: *Deleted

## 2013-05-15 NOTE — Telephone Encounter (Signed)
  Follow up Call-  Call back number 05/12/2013  Post procedure Call Back phone  # [phone]  Permission to leave phone message Yes     No answer,left message.

## 2013-05-16 ENCOUNTER — Encounter: Payer: Self-pay | Admitting: Internal Medicine

## 2013-05-16 NOTE — Progress Notes (Signed)
Quick Note:  Hyperplastic - next colon 2024 ______

## 2013-06-12 ENCOUNTER — Ambulatory Visit (INDEPENDENT_AMBULATORY_CARE_PROVIDER_SITE_OTHER): Payer: 59 | Admitting: Family Medicine

## 2013-06-12 ENCOUNTER — Encounter: Payer: Self-pay | Admitting: Family Medicine

## 2013-06-12 VITALS — BP 114/74 | HR 81 | Resp 16 | Wt 198.0 lb

## 2013-06-12 DIAGNOSIS — B3731 Acute candidiasis of vulva and vagina: Secondary | ICD-10-CM

## 2013-06-12 DIAGNOSIS — R300 Dysuria: Secondary | ICD-10-CM

## 2013-06-12 DIAGNOSIS — N3090 Cystitis, unspecified without hematuria: Secondary | ICD-10-CM

## 2013-06-12 DIAGNOSIS — B373 Candidiasis of vulva and vagina: Secondary | ICD-10-CM

## 2013-06-12 LAB — POCT URINALYSIS DIPSTICK
Bilirubin, UA: NEGATIVE
Glucose, UA: NEGATIVE
Ketones, UA: NEGATIVE
Leukocytes, UA: NEGATIVE
Nitrite, UA: POSITIVE
Protein, UA: NEGATIVE
Spec Grav, UA: 1.02
Urobilinogen, UA: NEGATIVE
pH, UA: 5

## 2013-06-12 MED ORDER — NITROFURANTOIN MONOHYD MACRO 100 MG PO CAPS: 100.0000 mg | ORAL_CAPSULE | Freq: Two times a day (BID) | ORAL | Status: AC

## 2013-06-12 MED ORDER — FLUCONAZOLE 150 MG PO TABS: 150.0000 mg | ORAL_TABLET | Freq: Once | ORAL | Status: DC

## 2013-06-12 MED ORDER — TERCONAZOLE 0.8 % VA CREA: 1.0000 | TOPICAL_CREAM | Freq: Every day | VAGINAL | Status: DC

## 2013-06-12 NOTE — Progress Notes (Signed)
Subjective:    Patient ID: [name], female    DOB: [birth year], 57 y.o.   MRN: [id number]   [name] is here today with UTI symptoms.    Urinary Tract Infection  This is a new problem. The current episode started today. The problem occurs intermittently. The quality of the pain is described as burning. The pain is at a severity of 5/10. The pain is moderate. There has been no fever. She is sexually active. There is no history of pyelonephritis. Associated symptoms include frequency. She has tried nothing for the symptoms.     Review of Systems  Constitutional: Negative.   HENT: Negative.   Eyes: Negative.   Respiratory: Negative.   Cardiovascular: Negative.   Gastrointestinal: Negative.   Genitourinary: Positive for dysuria and frequency.  Musculoskeletal: Negative.   Skin: Negative.   Allergic/Immunologic: Negative.   Neurological: Positive for tremors.  Hematological: Negative.   Psychiatric/Behavioral: Negative.      Past Medical History  Diagnosis Date  . Thyroid disease   . Hyperlipidemia   . Allergy   . GERD (gastroesophageal reflux disease)   . Mitral valve prolapse   . Graves disease   . Aortic insufficiency     Mild  . Mitral regurgitation   . Need for SBE (subacute bacterial endocarditis) prophylaxis   . Graves disease   . Migraine headache   . Pilonidal cyst without mention of abscess   . Nausea alone      Family History  Problem Relation Age of Onset  . Diabetes Mother   . Hypertension Mother   . Heart disease Father   . Hypertension Father   . Thyroid disease Father   . Cancer Maternal Aunt   . Cancer Maternal Aunt   . Colon cancer Neg Hx   . Esophageal cancer Neg Hx   . Rectal cancer Neg Hx   . Stomach cancer Neg Hx      History   Social History Narrative   Marital Status:  Married Merchant navy officer)   Children: 4   Pets:  Dogs (2)   Living Situation: Lives with spouse, son and his family.     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 Constitutional: She appears well-nourished. No distress.  Cardiovascular: Normal rate, regular rhythm and normal heart sounds.   Pulmonary/Chest: Effort normal and breath sounds normal.  Abdominal: She exhibits no mass. There is tenderness. There is no rebound, no guarding and no CVA tenderness.  Neurological: She is alert.  Skin: Skin is warm and dry. No rash noted.  Psychiatric: She has a normal mood and affect.      Assessment & Plan:    Alin was seen today for urinary tract infection.  Diagnoses and associated orders for this visit:  Dysuria Comments: A U/A was checked for infection.   - Urine Culture - POCT urinalysis dipstick  Cystitis Comments: Her urine appears to be infected.  It was sent for a culture.   - nitrofurantoin, macrocrystal-monohydrate, (MACROBID) 100 MG capsule; Take 1 capsule (100 mg total) by mouth 2 (  two) times daily. - ampicillin (PRINCIPEN) 500 MG capsule; Take 1 capsule (500 mg total) by mouth 4 (four) times daily.  Her urine grew >100,000 colonies of E. Coli. Since she has grown several infections over the past year with several resistant bacteria we decided to switch her from Macrobid to ampicillin.  Candidiasis of female genitalia Comments: She was given meds for yeast just in case.   - fluconazole (DIFLUCAN) 150 MG tablet; Take 1 tablet (150 mg total) by mouth once. - terconazole (TERAZOL 3) 0.8 % vaginal cream; Place 1 applicator vaginally at bedtime.

## 2013-06-14 LAB — URINE CULTURE: Colony Count: >=100000

## 2013-06-15 MED ORDER — AMPICILLIN 500 MG PO CAPS: 500.0000 mg | ORAL_CAPSULE | Freq: Four times a day (QID) | ORAL | Status: AC

## 2013-07-14 ENCOUNTER — Other Ambulatory Visit: Payer: Self-pay | Admitting: Family Medicine

## 2013-07-17 DIAGNOSIS — E669 Obesity, unspecified: Secondary | ICD-10-CM | POA: Insufficient documentation

## 2013-07-25 ENCOUNTER — Other Ambulatory Visit: Payer: Self-pay | Admitting: *Deleted

## 2013-07-25 DIAGNOSIS — I10 Essential (primary) hypertension: Secondary | ICD-10-CM

## 2013-07-25 MED ORDER — TRIAMTERENE-HCTZ 37.5-25 MG PO TABS: 0.5000 | ORAL_TABLET | Freq: Every day | ORAL | Status: DC

## 2013-08-15 ENCOUNTER — Ambulatory Visit (HOSPITAL_BASED_OUTPATIENT_CLINIC_OR_DEPARTMENT_OTHER)
Admission: RE | Admit: 2013-08-15 | Discharge: 2013-08-15 | Disposition: A | Discharge status: Home / Self Care | Payer: 59 | Source: Ambulatory Visit | Attending: Family Medicine | Admitting: Family Medicine

## 2013-08-15 ENCOUNTER — Encounter: Payer: Self-pay | Admitting: Family Medicine

## 2013-08-15 ENCOUNTER — Ambulatory Visit (INDEPENDENT_AMBULATORY_CARE_PROVIDER_SITE_OTHER): Payer: 59 | Admitting: Family Medicine

## 2013-08-15 VITALS — BP 117/76 | HR 70 | Temp 98.0°F | Resp 16 | Wt 197.0 lb

## 2013-08-15 DIAGNOSIS — R05 Cough: Secondary | ICD-10-CM | POA: Insufficient documentation

## 2013-08-15 DIAGNOSIS — J189 Pneumonia, unspecified organism: Secondary | ICD-10-CM

## 2013-08-15 DIAGNOSIS — B3731 Acute candidiasis of vulva and vagina: Secondary | ICD-10-CM

## 2013-08-15 DIAGNOSIS — R062 Wheezing: Secondary | ICD-10-CM | POA: Insufficient documentation

## 2013-08-15 DIAGNOSIS — R070 Pain in throat: Secondary | ICD-10-CM

## 2013-08-15 DIAGNOSIS — R059 Cough, unspecified: Secondary | ICD-10-CM

## 2013-08-15 DIAGNOSIS — B373 Candidiasis of vulva and vagina: Secondary | ICD-10-CM

## 2013-08-15 DIAGNOSIS — R053 Chronic cough: Secondary | ICD-10-CM | POA: Insufficient documentation

## 2013-08-15 DIAGNOSIS — R509 Fever, unspecified: Secondary | ICD-10-CM | POA: Insufficient documentation

## 2013-08-15 DIAGNOSIS — R0602 Shortness of breath: Secondary | ICD-10-CM | POA: Insufficient documentation

## 2013-08-15 LAB — POCT RAPID STREP A (OFFICE): Rapid Strep A Screen: NEGATIVE

## 2013-08-15 MED ORDER — AMOXICILLIN-POT CLAVULANATE 875-125 MG PO TABS: 1.0000 | ORAL_TABLET | Freq: Two times a day (BID) | ORAL | Status: AC

## 2013-08-15 MED ORDER — AZITHROMYCIN 500 MG PO TABS: 500.0000 mg | ORAL_TABLET | Freq: Every day | ORAL | Status: AC

## 2013-08-15 MED ORDER — HYDROCOD POLST-CHLORPHEN POLST 10-8 MG/5ML PO LQCR: 5.0000 mL | Freq: Two times a day (BID) | ORAL | Status: DC | PRN

## 2013-08-15 MED ORDER — BENZONATATE 200 MG PO CAPS: 200.0000 mg | ORAL_CAPSULE | Freq: Three times a day (TID) | ORAL | Status: DC | PRN

## 2013-08-15 MED ORDER — FLUCONAZOLE 200 MG PO TABS: ORAL_TABLET | ORAL | Status: DC

## 2013-08-15 MED ORDER — TERCONAZOLE 0.8 % VA CREA: 1.0000 | TOPICAL_CREAM | Freq: Every day | VAGINAL | Status: DC

## 2013-08-15 NOTE — Progress Notes (Signed)
Subjective:    Patient ID: [name], female    DOB: 1955/09/02, 58 y.o.   MRN: [id number]  [name] is here today complaining of chest congestion and cough.  She describes her symptoms as being moderate- severe and she feels that she is worsening after 2 weeks instead of getting better.       Cough The current episode started 1 to 4 weeks ago. The problem has been gradually worsening. The cough is productive of purulent sputum. Associated symptoms include chest pain, ear congestion, a fever, myalgias, nasal congestion, postnasal drip, rhinorrhea and a sore throat. Nothing aggravates the symptoms. She has tried OTC cough suppressant (Mucinex DM, Albuterol inhaler and Robitussin ) for the symptoms. Her past medical history is significant for environmental allergies.    Review of Systems  Constitutional: Positive for fever.  HENT: Positive for postnasal drip, rhinorrhea and sore throat.   Respiratory: Positive for cough.   Cardiovascular: Positive for chest pain.  Musculoskeletal: Positive for myalgias.  Allergic/Immunologic: Positive for environmental allergies.     Past Medical History  Diagnosis Date  . Thyroid disease   . Hyperlipidemia   . Allergy   . GERD (gastroesophageal reflux disease)   . Mitral valve prolapse   . Graves disease   . Aortic insufficiency     Mild  . Mitral regurgitation   . Need for SBE (subacute bacterial endocarditis) prophylaxis   . Graves disease   . Migraine headache   . Pilonidal cyst without mention of abscess   . Nausea alone      Past Surgical History  Procedure Laterality Date  . Tonsillectomy    . Nephrectomy    . Abdominal hysterectomy    . Knee surgery       History   Social History Narrative   Marital Status:  Married Marine scientist)   Children: 4   Pets:  Dogs (2)   Living Situation: Lives with spouse, son and his family.     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 Diabetes Mother   . Hypertension Mother   . Heart disease Father   . Hypertension Father   . Thyroid disease Father   . Cancer Maternal Aunt   . Cancer Maternal Aunt   . Colon cancer Neg Hx   . Esophageal cancer Neg Hx   . Rectal cancer Neg Hx   . Stomach cancer Neg Hx      Current Outpatient Prescriptions on File Prior to Visit  Medication Sig Dispense Refill  . albuterol (PROVENTIL HFA;VENTOLIN HFA) 108 (90 BASE) MCG/ACT inhaler Inhale 2 puffs into the lungs every 6 (six) hours as needed for wheezing.  1 Inhaler  11  . almotriptan (AXERT) 12.5 MG tablet Take 1 tablet (12.5 mg total) by mouth as needed for migraine. may repeat in 2 hours if needed  10 tablet  11  . citalopram (CELEXA) 20 MG tablet Take 1 tablet (20 mg total) by mouth at bedtime.  90 tablet  1  . esomeprazole (NEXIUM) 40 MG capsule Take 40 mg by mouth daily  before breakfast.      . ezetimibe (ZETIA) 10 MG tablet Take 1 tablet (10 mg total) by mouth daily.  90 tablet  3  . fluconazole (DIFLUCAN) 150 MG tablet Take 1 tablet (150 mg total) by mouth once.  1 tablet  0  . levothyroxine (SYNTHROID, LEVOTHROID) 150 MCG tablet Take 1 tablet (150 mcg total) by mouth daily.  90 tablet  3  . ondansetron (ZOFRAN) 8 MG tablet Take by mouth every 8 (eight) hours as needed for nausea.      . progesterone (PROMETRIUM) 200 MG capsule TAKE 1 CAPSULE (200 MG TOTAL) BY MOUTH AT BEDTIME.  30 capsule  5  . rosuvastatin (CRESTOR) 20 MG tablet Take 1/2 - 1 tab po daily  90 tablet  1  . triamterene-hydrochlorothiazide (MAXZIDE-25) 37.5-25 MG per tablet Take 0.5 tablets by mouth daily.  90 tablet  0  . Vitamin D, Ergocalciferol, (DRISDOL) 50000 UNITS CAPS Take 1 capsule po twice a week for 90 days  24 capsule  3   No  current facility-administered medications on file prior to visit.     Allergies  Allergen Reactions  . Morphine And Related Nausea And Vomiting  . Percocet [Oxycodone-Acetaminophen]     Tachycardia, syncope     Immunization History  Administered Date(s) Administered  . Tdap 08/11/2003  . Zoster 05/24/2012      Objective:   Physical Exam  Constitutional: Vital signs are normal. She appears well-nourished. She appears ill. No distress.  HENT:  Head: Normocephalic.  Mouth/Throat: No oropharyngeal exudate.  Eyes: Conjunctivae are normal. Right eye exhibits no discharge. Left eye exhibits no discharge.  Neck: Neck supple.  Cardiovascular: Normal rate, regular rhythm and normal heart sounds.  Exam reveals no gallop and no friction rub.   No murmur heard. Pulmonary/Chest: Effort normal and breath sounds normal. She has no wheezes. She exhibits no tenderness.  Lymphadenopathy:    She has no cervical adenopathy.  Neurological: She is alert.  Skin: Skin is warm and dry. No rash noted.  Psychiatric: She has a normal mood and affect.      Assessment & Plan:    [name] was seen today for cough.  Diagnoses and associated orders for this visit:  Cough Comments: She was given medications for her cough.   - DG Chest 2 View; ([place]) - POCT rapid strep A - benzonatate (TESSALON) 200 MG capsule; Take 1 capsule (200 mg total) by mouth 3 (three) times daily as needed for cough. - chlorpheniramine-HYDROcodone (TUSSIONEX PENNKINETIC ER) 10-8 MG/5ML LQCR; Take 5 mLs by mouth every 12 (twelve) hours as needed.  Throat pain Comments: Rapid strep was negative.   - POCT rapid strep A  CAP (community acquired pneumonia) Comments: X-ray showed pneumonia; Treating with Augmentin and Zithromax.   - amoxicillin-clavulanate (AUGMENTIN) 875-125 MG per tablet; Take 1 tablet by mouth every 12 (twelve) hours. - azithromycin (ZITHROMAX) 500 MG tablet; Take 1 tablet (500 mg total) by mouth daily.  Take 1 tablet daily for 3 days. - DG Chest 2 View; Future  Candidiasis of female genitalia Comments: She was given meds for yeast just in case.   - fluconazole (DIFLUCAN) 200 MG tablet; Take 1 tab po QOD for 7 days - terconazole (TERAZOL 3) 0.8 % vaginal cream; Place 1 applicator vaginally at bedtime.

## 2013-08-15 NOTE — Patient Instructions (Signed)
1)  Pneumonia - Take the Augmentin for 10 days and the Zithromax for 3 days. F/U with a CXR at [facility] in 6 weeks.   2)  Cough - Take Mucinex DM 1200/60 BID plus Tessalon Perles TID plus Tussionex BID    Pneumonia, Adult Pneumonia is an infection of the lungs.  CAUSES Pneumonia may be caused by bacteria or a virus. Usually, these infections are caused by breathing infectious particles into the lungs (respiratory tract). SYMPTOMS   Cough.  Fever.  Chest pain.  Increased rate of breathing.  Wheezing.  Mucus production. DIAGNOSIS  If you have the common symptoms of pneumonia, your caregiver will typically confirm the diagnosis with a chest X-ray. The X-ray will show an abnormality in the lung (pulmonary infiltrate) if you have pneumonia. Other tests of your blood, urine, or sputum may be done to find the specific cause of your pneumonia. Your caregiver may also do tests (blood gases or pulse oximetry) to see how well your lungs are working. TREATMENT  Some forms of pneumonia may be spread to other people when you cough or sneeze. You may be asked to wear a mask before and during your exam. Pneumonia that is caused by bacteria is treated with antibiotic medicine. Pneumonia that is caused by the influenza virus may be treated with an antiviral medicine. Most other viral infections must run their course. These infections will not respond to antibiotics.  PREVENTION A pneumococcal shot (vaccine) is available to prevent a common bacterial cause of pneumonia. This is usually suggested for:  People over 66 years old.  Patients on chemotherapy.  People with chronic lung problems, such as bronchitis or emphysema.  People with immune system problems. If you are over 65 or have a high risk condition, you may receive the pneumococcal vaccine if you have not received it before. In some countries, a routine influenza vaccine is also recommended. This vaccine can help prevent some cases of  pneumonia.You may be offered the influenza vaccine as part of your care. If you smoke, it is time to quit. You may receive instructions on how to stop smoking. Your caregiver can provide medicines and counseling to help you quit. HOME CARE INSTRUCTIONS   Cough suppressants may be used if you are losing too much rest. However, coughing protects you by clearing your lungs. You should avoid using cough suppressants if you can.  Your caregiver may have prescribed medicine if he or she thinks your pneumonia is caused by a bacteria or influenza. Finish your medicine even if you start to feel better.  Your caregiver may also prescribe an expectorant. This loosens the mucus to be coughed up.  Only take over-the-counter or prescription medicines for pain, discomfort, or fever as directed by your caregiver.  Do not smoke. Smoking is a common cause of bronchitis and can contribute to pneumonia. If you are a smoker and continue to smoke, your cough may last several weeks after your pneumonia has cleared.  A cold steam vaporizer or humidifier in your room or home may help loosen mucus.  Coughing is often worse at night. Sleeping in a semi-upright position in a recliner or using a couple pillows under your head will help with this.  Get rest as you feel it is needed. Your body will usually let you know when you need to rest. SEEK IMMEDIATE MEDICAL CARE IF:   Your illness becomes worse. This is especially true if you are elderly or weakened from any other disease.  You cannot control your cough with suppressants and are losing sleep.  You begin coughing up blood.  You develop pain which is getting worse or is uncontrolled with medicines.  You have a fever.  Any of the symptoms which initially brought you in for treatment are getting worse rather than better.  You develop shortness of breath or chest pain. MAKE SURE YOU:   Understand these instructions.  Will watch your condition.  Will get  help right away if you are not doing well or get worse. Document Released: 07/27/2005 Document Revised: 10/19/2011 Document Reviewed: 10/16/2010 [facility] Patient Information 2014 [place], [place].

## 2013-09-04 ENCOUNTER — Other Ambulatory Visit: Payer: Self-pay | Admitting: Family Medicine

## 2013-09-04 ENCOUNTER — Other Ambulatory Visit: Payer: Self-pay | Admitting: *Deleted

## 2013-09-04 DIAGNOSIS — R5383 Other fatigue: Secondary | ICD-10-CM

## 2013-09-04 DIAGNOSIS — E785 Hyperlipidemia, unspecified: Secondary | ICD-10-CM

## 2013-09-04 DIAGNOSIS — R5381 Other malaise: Secondary | ICD-10-CM

## 2013-09-05 ENCOUNTER — Other Ambulatory Visit: Payer: 59

## 2013-09-06 ENCOUNTER — Other Ambulatory Visit (INDEPENDENT_AMBULATORY_CARE_PROVIDER_SITE_OTHER): Payer: 59

## 2013-09-06 ENCOUNTER — Encounter (HOSPITAL_COMMUNITY): Payer: Self-pay | Admitting: Pharmacy Technician

## 2013-09-06 LAB — COMPLETE METABOLIC PANEL WITH GFR
ALT: 257 U/L — ABNORMAL HIGH (ref 0–35)
AST: 68 U/L — ABNORMAL HIGH (ref 0–37)
Albumin: 4.5 g/dL (ref 3.5–5.2)
Alkaline Phosphatase: 104 U/L (ref 39–117)
BUN: 24 mg/dL — ABNORMAL HIGH (ref 6–23)
CO2: 29 mEq/L (ref 19–32)
Calcium: 10.3 mg/dL (ref 8.4–10.5)
Chloride: 102 mEq/L (ref 96–112)
Creat: 1.24 mg/dL — ABNORMAL HIGH (ref 0.50–1.10)
GFR, Est African American: 55 mL/min — ABNORMAL LOW
GFR, Est Non African American: 48 mL/min — ABNORMAL LOW
Glucose, Bld: 111 mg/dL — ABNORMAL HIGH (ref 70–99)
Potassium: 4.6 mEq/L (ref 3.5–5.3)
Sodium: 138 mEq/L (ref 135–145)
Total Bilirubin: 0.7 mg/dL (ref 0.2–1.2)
Total Protein: 7 g/dL (ref 6.0–8.3)

## 2013-09-06 LAB — CBC WITH DIFFERENTIAL/PLATELET
Basophils Absolute: 0 10*3/uL (ref 0.0–0.1)
Basophils Relative: 0 % (ref 0–1)
Eosinophils Absolute: 0 10*3/uL (ref 0.0–0.7)
Eosinophils Relative: 0 % (ref 0–5)
HCT: 44 % (ref 36.0–46.0)
Hemoglobin: 14.9 g/dL (ref 12.0–15.0)
Lymphocytes Relative: 10 % — ABNORMAL LOW (ref 12–46)
Lymphs Abs: 1.2 10*3/uL (ref 0.7–4.0)
MCH: 27.8 pg (ref 26.0–34.0)
MCHC: 33.9 g/dL (ref 30.0–36.0)
MCV: 82.1 fL (ref 78.0–100.0)
Monocytes Absolute: 0.9 10*3/uL (ref 0.1–1.0)
Monocytes Relative: 7 % (ref 3–12)
Neutro Abs: 10.2 10*3/uL — ABNORMAL HIGH (ref 1.7–7.7)
Neutrophils Relative %: 83 % — ABNORMAL HIGH (ref 43–77)
Platelets: 478 10*3/uL — ABNORMAL HIGH (ref 150–400)
RBC: 5.36 MIL/uL — ABNORMAL HIGH (ref 3.87–5.11)
RDW: 15.9 % — ABNORMAL HIGH (ref 11.5–15.5)
WBC: 12.3 10*3/uL — ABNORMAL HIGH (ref 4.0–10.5)

## 2013-09-06 LAB — LIPID PANEL
Cholesterol: 246 mg/dL — ABNORMAL HIGH (ref 0–200)
HDL: 79 mg/dL (ref >39)
LDL Cholesterol: 146 mg/dL — ABNORMAL HIGH (ref 0–99)
Total CHOL/HDL Ratio: 3.1 Ratio
Triglycerides: 104 mg/dL (ref <150)
VLDL: 21 mg/dL (ref 0–40)

## 2013-09-06 LAB — TSH: TSH: 0.082 u[IU]/mL — ABNORMAL LOW (ref 0.350–4.500)

## 2013-09-07 ENCOUNTER — Ambulatory Visit: Payer: 59 | Admitting: Family Medicine

## 2013-09-07 ENCOUNTER — Encounter (HOSPITAL_COMMUNITY): Payer: Self-pay

## 2013-09-07 ENCOUNTER — Encounter (HOSPITAL_COMMUNITY)
Admission: RE | Admit: 2013-09-07 | Discharge: 2013-09-07 | Disposition: A | Discharge status: Home / Self Care | Payer: 59 | Source: Ambulatory Visit | Attending: Orthopaedic Surgery | Admitting: Orthopaedic Surgery

## 2013-09-07 ENCOUNTER — Encounter (HOSPITAL_COMMUNITY)
Admission: RE | Admit: 2013-09-07 | Discharge: 2013-09-07 | Disposition: A | Discharge status: Home / Self Care | Payer: 59 | Source: Ambulatory Visit | Attending: Anesthesiology | Admitting: Anesthesiology

## 2013-09-07 ENCOUNTER — Other Ambulatory Visit (HOSPITAL_COMMUNITY): Payer: Self-pay | Admitting: Orthopaedic Surgery

## 2013-09-07 ENCOUNTER — Other Ambulatory Visit (HOSPITAL_COMMUNITY): Payer: 59

## 2013-09-07 DIAGNOSIS — Z01812 Encounter for preprocedural laboratory examination: Secondary | ICD-10-CM | POA: Insufficient documentation

## 2013-09-07 DIAGNOSIS — Z01811 Encounter for preprocedural respiratory examination: Secondary | ICD-10-CM | POA: Insufficient documentation

## 2013-09-07 DIAGNOSIS — Z01818 Encounter for other preprocedural examination: Secondary | ICD-10-CM | POA: Insufficient documentation

## 2013-09-07 DIAGNOSIS — Z01810 Encounter for preprocedural cardiovascular examination: Secondary | ICD-10-CM | POA: Insufficient documentation

## 2013-09-07 HISTORY — DX: Pneumonia, unspecified organism: J18.9

## 2013-09-07 HISTORY — DX: Hypothyroidism, unspecified: E03.9

## 2013-09-07 HISTORY — DX: Other specified postprocedural states: Z98.890

## 2013-09-07 HISTORY — DX: Cardiac arrhythmia, unspecified: I49.9

## 2013-09-07 HISTORY — DX: Nausea with vomiting, unspecified: R11.2

## 2013-09-07 HISTORY — DX: Transplanted organ removal status: Z98.85

## 2013-09-07 HISTORY — DX: Anxiety disorder, unspecified: F41.9

## 2013-09-07 HISTORY — DX: Other specified postprocedural states: R11.2

## 2013-09-07 LAB — T4, FREE: Free T4: 1.97 ng/dL — ABNORMAL HIGH (ref 0.80–1.80)

## 2013-09-07 LAB — PROTIME-INR
INR: 0.99 (ref 0.00–1.49)
PROTHROMBIN TIME: 12.9 s (ref 11.6–15.2)

## 2013-09-07 LAB — URINALYSIS, ROUTINE W REFLEX MICROSCOPIC
Bilirubin Urine: NEGATIVE
Glucose, UA: NEGATIVE mg/dL
Hgb urine dipstick: NEGATIVE
Ketones, ur: NEGATIVE mg/dL
Leukocytes, UA: NEGATIVE
NITRITE: NEGATIVE
PROTEIN: NEGATIVE mg/dL
Specific Gravity, Urine: 1.028 (ref 1.005–1.030)
UROBILINOGEN UA: 0.2 mg/dL (ref 0.0–1.0)
pH: 5.5 (ref 5.0–8.0)

## 2013-09-07 LAB — TYPE AND SCREEN
ABO/RH(D): A POS
ANTIBODY SCREEN: NEGATIVE

## 2013-09-07 LAB — CBC
HCT: 44 % (ref 36.0–46.0)
Hemoglobin: 14.6 g/dL (ref 12.0–15.0)
MCH: 28.3 pg (ref 26.0–34.0)
MCHC: 33.2 g/dL (ref 30.0–36.0)
MCV: 85.3 fL (ref 78.0–100.0)
PLATELETS: 450 10*3/uL — AB (ref 150–400)
RBC: 5.16 MIL/uL — ABNORMAL HIGH (ref 3.87–5.11)
RDW: 15.2 % (ref 11.5–15.5)
WBC: 9.3 10*3/uL (ref 4.0–10.5)

## 2013-09-07 LAB — BASIC METABOLIC PANEL
BUN: 27 mg/dL — AB (ref 6–23)
CALCIUM: 9.6 mg/dL (ref 8.4–10.5)
CO2: 26 mEq/L (ref 19–32)
CREATININE: 1.29 mg/dL — AB (ref 0.50–1.10)
Chloride: 101 mEq/L (ref 96–112)
GFR, EST AFRICAN AMERICAN: 52 mL/min — AB (ref >90)
GFR, EST NON AFRICAN AMERICAN: 45 mL/min — AB (ref >90)
Glucose, Bld: 77 mg/dL (ref 70–99)
Potassium: 3.6 mEq/L — ABNORMAL LOW (ref 3.7–5.3)
Sodium: 142 mEq/L (ref 137–147)

## 2013-09-07 LAB — SURGICAL PCR SCREEN
MRSA, PCR: NEGATIVE
Staphylococcus aureus: NEGATIVE

## 2013-09-07 LAB — APTT: APTT: 33 s (ref 24–37)

## 2013-09-07 LAB — ABO/RH: ABO/RH(D): A POS

## 2013-09-07 LAB — T3, FREE: T3, Free: 2.6 pg/mL (ref 2.3–4.2)

## 2013-09-07 NOTE — Progress Notes (Signed)
PCP is Dr Bernadene Bell. [name] seeing a cardiologist. CXR noted in epic repeat today to follow up.

## 2013-09-07 NOTE — Pre-Procedure Instructions (Signed)
[name]  09/07/2013   Your procedure is scheduled on:  Feb 5 at 0730  Report to [facility]  2 * 3 at 0530AM.  Call this number if you have problems the morning of surgery: [phone]   Remember:   Do not eat food or drink liquids after midnight.   Take these medicines the morning of surgery with A SIP OF WATER:  Proventil (albuterol) if needed,  Nexium (Esomeprazole), Norco (hydrocodone) if needed, synthroid (levothyroxine), Zofran if needed  Stop taking Aspirin, Aleve,Ibuprofen,BC's, Goody's, Fish Oil, and Herbal medications   Do not wear jewelry, make-up or nail polish.  Do not wear lotions, powders, or perfumes. You may wear deodorant.  Do not shave 48 hours prior to surgery. Men may shave face and neck.  Do not bring valuables to the hospital.  [facility] is not responsible                  for any belongings or valuables.               Contacts, dentures or bridgework may not be worn into surgery.  Leave suitcase in the car. After surgery it may be brought to your room.  For patients admitted to the hospital, discharge time is determined by your                treatment team.               Patients discharged the day of surgery will not be allowed to drive  home.    Special Instructions: Shower using CHG 2 nights before surgery and the night before surgery.  If you shower the day of surgery use CHG.  Use special wash - you have one bottle of CHG for all showers.  You should use approximately 1/3 of the bottle for each shower.   Please read over the following fact sheets that you were given: Pain Booklet, Coughing and Deep Breathing, Blood Transfusion Information, MRSA Information and Surgical Site Infection Prevention

## 2013-09-08 ENCOUNTER — Encounter (HOSPITAL_COMMUNITY): Payer: Self-pay

## 2013-09-08 ENCOUNTER — Encounter: Payer: Self-pay | Admitting: Family Medicine

## 2013-09-08 ENCOUNTER — Ambulatory Visit (INDEPENDENT_AMBULATORY_CARE_PROVIDER_SITE_OTHER): Payer: 59 | Admitting: Family Medicine

## 2013-09-08 DIAGNOSIS — F411 Generalized anxiety disorder: Secondary | ICD-10-CM

## 2013-09-08 DIAGNOSIS — I10 Essential (primary) hypertension: Secondary | ICD-10-CM

## 2013-09-08 DIAGNOSIS — R748 Abnormal levels of other serum enzymes: Secondary | ICD-10-CM

## 2013-09-08 DIAGNOSIS — K219 Gastro-esophageal reflux disease without esophagitis: Secondary | ICD-10-CM

## 2013-09-08 DIAGNOSIS — E785 Hyperlipidemia, unspecified: Secondary | ICD-10-CM

## 2013-09-08 DIAGNOSIS — R740 Nonspecific elevation of levels of transaminase and lactic acid dehydrogenase [LDH]: Secondary | ICD-10-CM

## 2013-09-08 DIAGNOSIS — R7401 Elevation of levels of liver transaminase levels: Secondary | ICD-10-CM

## 2013-09-08 DIAGNOSIS — E039 Hypothyroidism, unspecified: Secondary | ICD-10-CM

## 2013-09-08 DIAGNOSIS — R110 Nausea: Secondary | ICD-10-CM

## 2013-09-08 MED ORDER — TRIAMTERENE-HCTZ 37.5-25 MG PO TABS: 0.5000 | ORAL_TABLET | Freq: Every day | ORAL | Status: DC

## 2013-09-08 MED ORDER — ESOMEPRAZOLE MAGNESIUM 40 MG PO CPDR: 40.0000 mg | DELAYED_RELEASE_CAPSULE | Freq: Every day | ORAL | Status: DC

## 2013-09-08 MED ORDER — ONDANSETRON HCL 8 MG PO TABS
8.0000 mg | ORAL_TABLET | Freq: Three times a day (TID) | ORAL | Status: DC | PRN
Start: 2013-09-08 — End: 2014-01-25

## 2013-09-08 MED ORDER — CITALOPRAM HYDROBROMIDE 20 MG PO TABS: 20.0000 mg | ORAL_TABLET | Freq: Every day | ORAL | Status: DC

## 2013-09-08 MED ORDER — LEVOTHYROXINE SODIUM 112 MCG PO TABS: 112.0000 ug | ORAL_TABLET | Freq: Every day | ORAL | Status: DC

## 2013-09-08 MED ORDER — ROSUVASTATIN CALCIUM 10 MG PO TABS: 10.0000 mg | ORAL_TABLET | Freq: Every day | ORAL | Status: DC

## 2013-09-08 MED ORDER — LIOTHYRONINE SODIUM 5 MCG PO TABS: ORAL_TABLET | ORAL | Status: DC

## 2013-09-08 NOTE — Progress Notes (Addendum)
Anesthesia Chart Review:  Patient is a 58 year old female scheduled for left TKA by Dr. Jean Rosenthal on 09/14/13.    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 History listed also shows MVP, mild AI, MR, and need for SBE prophylaxis. 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  PCP is Dr. Ival Bible.  Preoperative EKG, CXR and labs noted.  I anticipate that she can proceed as planned.  [facility] Stay [facility]/Anesthesiology Phone [phone] 09/08/2013 4:38 PM

## 2013-09-08 NOTE — Progress Notes (Signed)
Subjective:    Patient ID: [name], female    DOB: 22-Jun-1956, 58 y.o.   MRN: [id number]  HPI  [name] is here today to go over her most recent lab results and to discuss the conditions listed below:  1)  Mood -  Her mood is stable on Celexa.  She needs a refill on it.    2)  Hyperlipidemia - She does fine on the combination of Zetia and Crestor but does not take them as consistently as she should.  She needs both medications refilled.   3)  Hypertension - Her blood pressure is controlled with her Triam/HCTZ.  She needs a refill on it.    4)  GERD - Her reflux symptoms are controlled best with Nexium and she would like to remain on it.    5)  Nausea - She needs to also have her Zofran refilled.     Review of Systems  Constitutional: Negative for activity change, fatigue and unexpected weight change.  HENT: Negative.   Eyes: Negative.   Respiratory: Negative for shortness of breath.   Cardiovascular: Negative for chest pain, palpitations and leg swelling.  Gastrointestinal: Negative for diarrhea and constipation.  Endocrine: Negative.   Genitourinary: Negative for difficulty urinating.  Musculoskeletal: Negative.   Skin: Negative.   Neurological: Negative.   Hematological: Negative for adenopathy. Does not bruise/bleed easily.  Psychiatric/Behavioral: Negative for sleep disturbance and dysphoric mood. The patient is not nervous/anxious.       Past Medical History  Diagnosis Date  . Thyroid disease   . Hyperlipidemia   . Allergy   . GERD (gastroesophageal reflux disease)   . Mitral valve prolapse   . Aortic insufficiency     Mild  . Mitral regurgitation   . Need for SBE (subacute bacterial endocarditis) prophylaxis   . Migraine headache   . Pilonidal cyst without mention of abscess   . Nausea alone   . PONV (postoperative nausea and vomiting)   . Hypothyroidism   . Dysrhythmia     pvc's at times  . Anxiety   . Pneumonia Aug 14 2012  . Transplanted kidney  removed     donated kidney     Past Surgical History  Procedure Laterality Date  . Tonsillectomy    . Nephrectomy    . Abdominal hysterectomy    . Knee surgery    . Carpal tunnel release Right   . Total knee arthroplasty Left 09/14/2013    Procedure: LEFT TOTAL KNEE ARTHROPLASTY;  Surgeon: [name], MD;  Location: [facility];  Service: Orthopedics;  Laterality: Left;     History   Social History Narrative   Marital Status:  Married Marine scientist)   Children: 4   Pets:  Dogs (2)   Living Situation: Lives with spouse, son and his family.     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 Diabetes Mother   . Hypertension Mother   . Heart disease Father   . Hypertension Father   . Thyroid disease Father   . Cancer Maternal Aunt   .  Cancer Maternal Aunt   . Colon cancer Neg Hx   . Esophageal cancer Neg Hx   . Rectal cancer Neg Hx   . Stomach cancer Neg Hx      Current Outpatient Prescriptions on File Prior to Visit  Medication Sig Dispense Refill  . albuterol (PROVENTIL HFA;VENTOLIN HFA) 108 (90 BASE) MCG/ACT inhaler Inhale 2 puffs into the lungs every 6 (six) hours as needed for wheezing.  1 Inhaler  11  . ezetimibe (ZETIA) 10 MG tablet Take 1 tablet (10 mg total) by mouth daily.  90 tablet  3  . methylcellulose (ARTIFICIAL TEARS) 1 % ophthalmic solution Place 2 drops into both eyes daily as needed (dry eyes).      . Vitamin D, Ergocalciferol, (DRISDOL) 50000 UNITS CAPS Take 1 capsule po twice a week for 90 days  24 capsule  3  . almotriptan (AXERT) 12.5 MG tablet Take 1 tablet (12.5 mg total) by mouth as needed for migraine. may repeat in 2 hours if needed  10 tablet  11  . aspirin EC 325 MG EC  tablet Take 1 tablet (325 mg total) by mouth 2 (two) times daily after a meal.  30 tablet  0  . Nutritional Supplements (JUICE PLUS FIBRE PO) Take 2 tablets by mouth daily.      . traMADol (ULTRAM) 50 MG tablet Take 2 tablets (100 mg total) by mouth every 6 (six) hours as needed for moderate pain.  60 tablet  0   No current facility-administered medications on file prior to visit.     Allergies  Allergen Reactions  . Morphine And Related Nausea And Vomiting  . Percocet [Oxycodone-Acetaminophen]     Tachycardia, syncope     Immunization History  Administered Date(s) Administered  . Tdap 08/11/2003  . Zoster 05/24/2012       Objective:   Physical Exam  Vitals reviewed. Constitutional: She is oriented to person, place, and time.  Eyes: Conjunctivae are normal. No scleral icterus.  Neck: Neck supple. No thyromegaly present.  Cardiovascular: Normal rate, regular rhythm and normal heart sounds.   Pulmonary/Chest: Effort normal and breath sounds normal.  Musculoskeletal: She exhibits no edema and no tenderness.  Lymphadenopathy:    She has no cervical adenopathy.  Neurological: She is alert and oriented to person, place, and time.  Skin: Skin is warm and dry.  Psychiatric: She has a normal mood and affect. Her behavior is normal. Judgment and thought content normal.      Assessment & Plan:    [name] was seen today for medication management.  Diagnoses and associated orders for this visit:  Anxiety state, unspecified Comments: Her mood remains good on citalopram so she will continue on her current dosage.   - citalopram (CELEXA) 20 MG tablet; Take 1 tablet (20 mg total) by mouth at bedtime.  Unspecified hypothyroidism Comments: Her TSH is low, her Free T4 is high and her Free T3 is not optimal so we will lower her levothyroxine to 112 mcg and add Cytomel 5-10 mcg.   - levothyroxine (SYNTHROID, LEVOTHROID) 112 MCG tablet; Take 1 tablet (112 mcg total) by mouth  daily. - liothyronine (CYTOMEL) 5 MCG tablet; Take 1-2 tabs po q am  Essential hypertension, benign - triamterene-hydrochlorothiazide (MAXZIDE-25) 37.5-25 MG per tablet; Take 0.5 tablets by mouth daily.  Other and unspecified hyperlipidemia Comments: She is to hold Crestor until her LFTs are back to normal.   - rosuvastatin (CRESTOR) 10 MG tablet; Take 1 tablet (10 mg total)  by mouth daily.  GERD (gastroesophageal reflux disease) - esomeprazole (NEXIUM) 40 MG capsule; Take 1 capsule (40 mg total) by mouth daily before breakfast.  Nausea alone - ondansetron (ZOFRAN) 8 MG tablet; Take 1 tablet (8 mg total) by mouth every 8 (eight) hours as needed for nausea.  Abnormal transaminases Comments: Her LFTs are high.  She is to decrease her intake of fatty foods and take Vitamin E.     TIME SPENT "FACE TO FACE" WITH PATIENT -  45 MINS

## 2013-09-13 MED ORDER — CHLORHEXIDINE GLUCONATE 4 % EX LIQD: 60.0000 mL | Freq: Once | CUTANEOUS | Status: DC

## 2013-09-13 MED ORDER — CEFAZOLIN SODIUM-DEXTROSE 2-3 GM-% IV SOLR
2.0000 g | INTRAVENOUS | Status: AC
  Administered 2013-09-14: 2 g via INTRAVENOUS
  Filled 2013-09-13: qty 50

## 2013-09-14 ENCOUNTER — Encounter (HOSPITAL_COMMUNITY): Payer: 59 | Admitting: Vascular Surgery

## 2013-09-14 ENCOUNTER — Inpatient Hospital Stay (HOSPITAL_COMMUNITY): Payer: 59

## 2013-09-14 ENCOUNTER — Encounter (HOSPITAL_COMMUNITY): Payer: Self-pay | Admitting: Surgery

## 2013-09-14 ENCOUNTER — Encounter (HOSPITAL_COMMUNITY)
Admission: RE | Disposition: A | Discharge status: Home / Self Care | Payer: Self-pay | Source: Ambulatory Visit | Attending: Orthopaedic Surgery

## 2013-09-14 ENCOUNTER — Inpatient Hospital Stay (HOSPITAL_COMMUNITY)
Admission: RE | Admit: 2013-09-14 | Discharge: 2013-09-15 | DRG: 470 | Disposition: A | Discharge status: Home / Self Care | Payer: 59 | Source: Ambulatory Visit | Attending: Orthopaedic Surgery | Admitting: Orthopaedic Surgery

## 2013-09-14 ENCOUNTER — Inpatient Hospital Stay (HOSPITAL_COMMUNITY): Payer: 59 | Admitting: Anesthesiology

## 2013-09-14 DIAGNOSIS — I059 Rheumatic mitral valve disease, unspecified: Secondary | ICD-10-CM | POA: Diagnosis present

## 2013-09-14 DIAGNOSIS — Z87891 Personal history of nicotine dependence: Secondary | ICD-10-CM

## 2013-09-14 DIAGNOSIS — E039 Hypothyroidism, unspecified: Secondary | ICD-10-CM | POA: Diagnosis present

## 2013-09-14 DIAGNOSIS — I10 Essential (primary) hypertension: Secondary | ICD-10-CM | POA: Diagnosis present

## 2013-09-14 DIAGNOSIS — K219 Gastro-esophageal reflux disease without esophagitis: Secondary | ICD-10-CM | POA: Diagnosis present

## 2013-09-14 DIAGNOSIS — E785 Hyperlipidemia, unspecified: Secondary | ICD-10-CM | POA: Diagnosis present

## 2013-09-14 DIAGNOSIS — I359 Nonrheumatic aortic valve disorder, unspecified: Secondary | ICD-10-CM | POA: Diagnosis present

## 2013-09-14 DIAGNOSIS — M25569 Pain in unspecified knee: Secondary | ICD-10-CM | POA: Diagnosis present

## 2013-09-14 DIAGNOSIS — E559 Vitamin D deficiency, unspecified: Secondary | ICD-10-CM | POA: Diagnosis present

## 2013-09-14 DIAGNOSIS — F411 Generalized anxiety disorder: Secondary | ICD-10-CM | POA: Diagnosis present

## 2013-09-14 DIAGNOSIS — J309 Allergic rhinitis, unspecified: Secondary | ICD-10-CM | POA: Diagnosis present

## 2013-09-14 DIAGNOSIS — Z96659 Presence of unspecified artificial knee joint: Secondary | ICD-10-CM

## 2013-09-14 DIAGNOSIS — D62 Acute posthemorrhagic anemia: Secondary | ICD-10-CM | POA: Diagnosis not present

## 2013-09-14 DIAGNOSIS — E669 Obesity, unspecified: Secondary | ICD-10-CM | POA: Diagnosis present

## 2013-09-14 DIAGNOSIS — M1710 Unilateral primary osteoarthritis, unspecified knee: Principal | ICD-10-CM | POA: Diagnosis present

## 2013-09-14 DIAGNOSIS — M1712 Unilateral primary osteoarthritis, left knee: Secondary | ICD-10-CM

## 2013-09-14 HISTORY — PX: TOTAL KNEE ARTHROPLASTY: SHX125

## 2013-09-14 SURGERY — ARTHROPLASTY, KNEE, TOTAL
Anesthesia: General | Site: Knee | Laterality: Left

## 2013-09-14 MED ORDER — PROPOFOL 10 MG/ML IV BOLUS
INTRAVENOUS | Status: AC
  Filled 2013-09-14: qty 20

## 2013-09-14 MED ORDER — MENTHOL 3 MG MT LOZG: 1.0000 | LOZENGE | OROMUCOSAL | Status: DC | PRN

## 2013-09-14 MED ORDER — POLYVINYL ALCOHOL 1.4 % OP SOLN
2.0000 [drp] | Freq: Every day | OPHTHALMIC | Status: DC | PRN
  Filled 2013-09-14: qty 15

## 2013-09-14 MED ORDER — EZETIMIBE 10 MG PO TABS
10.0000 mg | ORAL_TABLET | Freq: Every day | ORAL | Status: DC
  Administered 2013-09-15: 10 mg via ORAL
  Filled 2013-09-14 (×2): qty 1

## 2013-09-14 MED ORDER — DEXAMETHASONE SODIUM PHOSPHATE 10 MG/ML IJ SOLN
INTRAMUSCULAR | Status: DC | PRN
  Administered 2013-09-14: 6 mg

## 2013-09-14 MED ORDER — ALBUTEROL SULFATE HFA 108 (90 BASE) MCG/ACT IN AERS: 2.0000 | INHALATION_SPRAY | Freq: Four times a day (QID) | RESPIRATORY_TRACT | Status: DC | PRN

## 2013-09-14 MED ORDER — PROPOFOL 10 MG/ML IV BOLUS
INTRAVENOUS | Status: DC | PRN
  Administered 2013-09-14: 170 mg via INTRAVENOUS

## 2013-09-14 MED ORDER — ROPIVACAINE HCL 5 MG/ML IJ SOLN
INTRAMUSCULAR | Status: DC | PRN
  Administered 2013-09-14: 100 mg via PERINEURAL

## 2013-09-14 MED ORDER — ATORVASTATIN CALCIUM 40 MG PO TABS
40.0000 mg | ORAL_TABLET | Freq: Every day | ORAL | Status: DC
  Filled 2013-09-14 (×3): qty 1

## 2013-09-14 MED ORDER — ALUM & MAG HYDROXIDE-SIMETH 200-200-20 MG/5ML PO SUSP: 30.0000 mL | ORAL | Status: DC | PRN

## 2013-09-14 MED ORDER — METHOCARBAMOL 500 MG PO TABS
500.0000 mg | ORAL_TABLET | Freq: Four times a day (QID) | ORAL | Status: DC | PRN
  Administered 2013-09-15: 500 mg via ORAL
  Filled 2013-09-14: qty 1

## 2013-09-14 MED ORDER — DEXAMETHASONE SODIUM PHOSPHATE 10 MG/ML IJ SOLN
10.0000 mg | Freq: Three times a day (TID) | INTRAMUSCULAR | Status: AC
  Administered 2013-09-14 – 2013-09-15 (×3): 10 mg via INTRAVENOUS
  Filled 2013-09-14 (×3): qty 1

## 2013-09-14 MED ORDER — EPHEDRINE SULFATE 50 MG/ML IJ SOLN
INTRAMUSCULAR | Status: DC | PRN
  Administered 2013-09-14: 5 mg via INTRAVENOUS
  Administered 2013-09-14: 10 mg via INTRAVENOUS

## 2013-09-14 MED ORDER — KETOROLAC TROMETHAMINE 15 MG/ML IJ SOLN
INTRAMUSCULAR | Status: AC
  Filled 2013-09-14: qty 1

## 2013-09-14 MED ORDER — METOCLOPRAMIDE HCL 5 MG/ML IJ SOLN: 5.0000 mg | Freq: Three times a day (TID) | INTRAMUSCULAR | Status: DC | PRN

## 2013-09-14 MED ORDER — POLYETHYLENE GLYCOL 3350 17 G PO PACK: 17.0000 g | PACK | Freq: Every day | ORAL | Status: DC | PRN

## 2013-09-14 MED ORDER — OXYCODONE HCL 5 MG/5ML PO SOLN: 5.0000 mg | Freq: Once | ORAL | Status: DC | PRN

## 2013-09-14 MED ORDER — ASPIRIN EC 325 MG PO TBEC
325.0000 mg | DELAYED_RELEASE_TABLET | Freq: Two times a day (BID) | ORAL | Status: DC
  Administered 2013-09-14 – 2013-09-15 (×2): 325 mg via ORAL
  Filled 2013-09-14 (×5): qty 1

## 2013-09-14 MED ORDER — SODIUM CHLORIDE 0.9 % IJ SOLN
INTRAMUSCULAR | Status: DC | PRN
  Administered 2013-09-14: 10:00:00

## 2013-09-14 MED ORDER — CITALOPRAM HYDROBROMIDE 20 MG PO TABS
20.0000 mg | ORAL_TABLET | Freq: Every day | ORAL | Status: DC
  Administered 2013-09-14: 20 mg via ORAL
  Filled 2013-09-14 (×2): qty 1

## 2013-09-14 MED ORDER — FENTANYL CITRATE 0.05 MG/ML IJ SOLN
INTRAMUSCULAR | Status: DC | PRN
Start: 2013-09-14 — End: 2013-09-14
  Administered 2013-09-14: 100 ug via INTRAVENOUS
  Administered 2013-09-14 (×4): 50 ug via INTRAVENOUS

## 2013-09-14 MED ORDER — DOCUSATE SODIUM 100 MG PO CAPS
100.0000 mg | ORAL_CAPSULE | Freq: Two times a day (BID) | ORAL | Status: DC
  Administered 2013-09-14 – 2013-09-15 (×3): 100 mg via ORAL
  Filled 2013-09-14 (×4): qty 1

## 2013-09-14 MED ORDER — ACETAMINOPHEN 325 MG PO TABS
650.0000 mg | ORAL_TABLET | Freq: Four times a day (QID) | ORAL | Status: DC | PRN
Start: 2013-09-14 — End: 2013-09-15

## 2013-09-14 MED ORDER — FENTANYL CITRATE 0.05 MG/ML IJ SOLN
INTRAMUSCULAR | Status: AC
  Filled 2013-09-14: qty 5

## 2013-09-14 MED ORDER — ACETAMINOPHEN 650 MG RE SUPP: 650.0000 mg | Freq: Four times a day (QID) | RECTAL | Status: DC | PRN

## 2013-09-14 MED ORDER — SCOPOLAMINE 1 MG/3DAYS TD PT72
MEDICATED_PATCH | TRANSDERMAL | Status: DC | PRN
  Administered 2013-09-14: 1 via TRANSDERMAL

## 2013-09-14 MED ORDER — ONDANSETRON HCL 4 MG PO TABS: 4.0000 mg | ORAL_TABLET | Freq: Four times a day (QID) | ORAL | Status: DC | PRN

## 2013-09-14 MED ORDER — SODIUM CHLORIDE 0.9 % IJ SOLN
INTRAMUSCULAR | Status: AC
  Filled 2013-09-14: qty 10

## 2013-09-14 MED ORDER — CEFAZOLIN SODIUM 1-5 GM-% IV SOLN
1.0000 g | Freq: Four times a day (QID) | INTRAVENOUS | Status: AC
  Administered 2013-09-14 (×2): 1 g via INTRAVENOUS
  Filled 2013-09-14 (×2): qty 50

## 2013-09-14 MED ORDER — KETOROLAC TROMETHAMINE 15 MG/ML IJ SOLN
15.0000 mg | Freq: Four times a day (QID) | INTRAMUSCULAR | Status: AC
  Administered 2013-09-14 – 2013-09-15 (×4): 15 mg via INTRAVENOUS
  Filled 2013-09-14 (×3): qty 1

## 2013-09-14 MED ORDER — SCOPOLAMINE 1 MG/3DAYS TD PT72
MEDICATED_PATCH | TRANSDERMAL | Status: AC
  Filled 2013-09-14: qty 1

## 2013-09-14 MED ORDER — PROMETHAZINE HCL 25 MG/ML IJ SOLN
6.2500 mg | INTRAMUSCULAR | Status: DC | PRN
  Administered 2013-09-14: 6.25 mg via INTRAVENOUS

## 2013-09-14 MED ORDER — LACTATED RINGERS IV SOLN
INTRAVENOUS | Status: DC | PRN
  Administered 2013-09-14 (×2): via INTRAVENOUS

## 2013-09-14 MED ORDER — METHYLCELLULOSE 1 % OP SOLN: 2.0000 [drp] | Freq: Every day | OPHTHALMIC | Status: DC | PRN

## 2013-09-14 MED ORDER — DEXTROSE 5 % IV SOLN
500.0000 mg | Freq: Four times a day (QID) | INTRAVENOUS | Status: DC | PRN
  Filled 2013-09-14: qty 5

## 2013-09-14 MED ORDER — MIDAZOLAM HCL 2 MG/2ML IJ SOLN
INTRAMUSCULAR | Status: AC
  Filled 2013-09-14: qty 2

## 2013-09-14 MED ORDER — BUPIVACAINE HCL (PF) 0.25 % IJ SOLN
INTRAMUSCULAR | Status: AC
  Filled 2013-09-14: qty 30

## 2013-09-14 MED ORDER — ONDANSETRON HCL 4 MG/2ML IJ SOLN
INTRAMUSCULAR | Status: DC | PRN
  Administered 2013-09-14 (×2): 4 mg via INTRAVENOUS

## 2013-09-14 MED ORDER — DEXAMETHASONE SODIUM PHOSPHATE 4 MG/ML IJ SOLN
INTRAMUSCULAR | Status: DC | PRN
  Administered 2013-09-14: 4 mg via INTRAVENOUS

## 2013-09-14 MED ORDER — METOCLOPRAMIDE HCL 10 MG PO TABS
5.0000 mg | ORAL_TABLET | Freq: Three times a day (TID) | ORAL | Status: DC | PRN
Start: 2013-09-14 — End: 2013-09-15

## 2013-09-14 MED ORDER — HYDROMORPHONE HCL PF 1 MG/ML IJ SOLN
0.2500 mg | INTRAMUSCULAR | Status: DC | PRN
  Administered 2013-09-14: 0.25 mg via INTRAVENOUS

## 2013-09-14 MED ORDER — SODIUM CHLORIDE 0.9 % IR SOLN
Status: DC | PRN
  Administered 2013-09-14 (×2): 1000 mL

## 2013-09-14 MED ORDER — PROMETHAZINE HCL 25 MG/ML IJ SOLN
INTRAMUSCULAR | Status: AC
  Administered 2013-09-14: 6.25 mg via INTRAVENOUS
  Filled 2013-09-14: qty 1

## 2013-09-14 MED ORDER — 0.9 % SODIUM CHLORIDE (POUR BTL) OPTIME
TOPICAL | Status: DC | PRN
  Administered 2013-09-14: 1000 mL

## 2013-09-14 MED ORDER — LIDOCAINE HCL (CARDIAC) 20 MG/ML IV SOLN
INTRAVENOUS | Status: AC
  Filled 2013-09-14: qty 5

## 2013-09-14 MED ORDER — ONDANSETRON HCL 4 MG/2ML IJ SOLN: 4.0000 mg | Freq: Four times a day (QID) | INTRAMUSCULAR | Status: DC | PRN

## 2013-09-14 MED ORDER — EPHEDRINE SULFATE 50 MG/ML IJ SOLN
INTRAMUSCULAR | Status: AC
  Filled 2013-09-14: qty 1

## 2013-09-14 MED ORDER — ZOLPIDEM TARTRATE 5 MG PO TABS
5.0000 mg | ORAL_TABLET | Freq: Every evening | ORAL | Status: DC | PRN
Start: 2013-09-14 — End: 2013-09-15

## 2013-09-14 MED ORDER — LIOTHYRONINE SODIUM 5 MCG PO TABS
5.0000 ug | ORAL_TABLET | Freq: Every day | ORAL | Status: DC
  Administered 2013-09-15: 5 ug via ORAL
  Filled 2013-09-14 (×3): qty 1

## 2013-09-14 MED ORDER — TRIAMTERENE-HCTZ 37.5-25 MG PO TABS
0.5000 | ORAL_TABLET | Freq: Every day | ORAL | Status: DC
  Filled 2013-09-14 (×2): qty 0.5

## 2013-09-14 MED ORDER — BUPIVACAINE LIPOSOME 1.3 % IJ SUSP
20.0000 mL | Freq: Once | INTRAMUSCULAR | Status: DC
  Filled 2013-09-14: qty 20

## 2013-09-14 MED ORDER — PANTOPRAZOLE SODIUM 40 MG PO TBEC
80.0000 mg | DELAYED_RELEASE_TABLET | Freq: Every day | ORAL | Status: DC
  Administered 2013-09-15: 80 mg via ORAL
  Filled 2013-09-14: qty 2

## 2013-09-14 MED ORDER — HYDROMORPHONE HCL PF 1 MG/ML IJ SOLN: 1.0000 mg | INTRAMUSCULAR | Status: DC | PRN

## 2013-09-14 MED ORDER — ALBUTEROL SULFATE (2.5 MG/3ML) 0.083% IN NEBU: 2.5000 mg | INHALATION_SOLUTION | Freq: Four times a day (QID) | RESPIRATORY_TRACT | Status: DC | PRN

## 2013-09-14 MED ORDER — LEVOTHYROXINE SODIUM 112 MCG PO TABS
112.0000 ug | ORAL_TABLET | Freq: Every day | ORAL | Status: DC
  Administered 2013-09-15: 112 ug via ORAL
  Filled 2013-09-14 (×2): qty 1

## 2013-09-14 MED ORDER — DEXAMETHASONE SODIUM PHOSPHATE 4 MG/ML IJ SOLN
INTRAMUSCULAR | Status: AC
  Filled 2013-09-14: qty 1

## 2013-09-14 MED ORDER — OXYCODONE HCL 5 MG PO TABS
5.0000 mg | ORAL_TABLET | Freq: Once | ORAL | Status: DC | PRN
Start: 2013-09-14 — End: 2013-09-14

## 2013-09-14 MED ORDER — HYDROCODONE-ACETAMINOPHEN 5-325 MG PO TABS: 1.0000 | ORAL_TABLET | ORAL | Status: DC | PRN

## 2013-09-14 MED ORDER — HYDROMORPHONE HCL PF 1 MG/ML IJ SOLN
INTRAMUSCULAR | Status: AC
  Filled 2013-09-14: qty 1

## 2013-09-14 MED ORDER — DEXAMETHASONE 6 MG PO TABS
10.0000 mg | ORAL_TABLET | Freq: Three times a day (TID) | ORAL | Status: AC
  Filled 2013-09-14 (×3): qty 1

## 2013-09-14 MED ORDER — MIDAZOLAM HCL 5 MG/5ML IJ SOLN
INTRAMUSCULAR | Status: DC | PRN
  Administered 2013-09-14: 2 mg via INTRAVENOUS

## 2013-09-14 MED ORDER — TRAMADOL HCL 50 MG PO TABS
50.0000 mg | ORAL_TABLET | Freq: Four times a day (QID) | ORAL | Status: DC | PRN
  Administered 2013-09-14 – 2013-09-15 (×2): 100 mg via ORAL
  Filled 2013-09-14 (×2): qty 2

## 2013-09-14 MED ORDER — SODIUM CHLORIDE 0.9 % IV SOLN
INTRAVENOUS | Status: DC
  Administered 2013-09-14 – 2013-09-15 (×2): via INTRAVENOUS

## 2013-09-14 MED ORDER — TRANEXAMIC ACID 100 MG/ML IV SOLN
1000.0000 mg | INTRAVENOUS | Status: AC
  Administered 2013-09-14: 1000 mg via INTRAVENOUS
  Filled 2013-09-14: qty 10

## 2013-09-14 MED ORDER — ONDANSETRON HCL 4 MG/2ML IJ SOLN
INTRAMUSCULAR | Status: AC
  Filled 2013-09-14: qty 2

## 2013-09-14 MED ORDER — PHENOL 1.4 % MT LIQD: 1.0000 | OROMUCOSAL | Status: DC | PRN

## 2013-09-14 MED ORDER — DIPHENHYDRAMINE HCL 12.5 MG/5ML PO ELIX: 12.5000 mg | ORAL_SOLUTION | ORAL | Status: DC | PRN

## 2013-09-14 SURGICAL SUPPLY — 71 items
BANDAGE ELASTIC 6 VELCRO ST LF (GAUZE/BANDAGES/DRESSINGS) ×4 IMPLANT
BANDAGE ESMARK 6X9 LF (GAUZE/BANDAGES/DRESSINGS) ×1 IMPLANT
BENZOIN TINCTURE PRP APPL 2/3 (GAUZE/BANDAGES/DRESSINGS) ×2 IMPLANT
BLADE SAG 18X100X1.27 (BLADE) ×4 IMPLANT
BLADE SAW SGTL 13.0X1.19X90.0M (BLADE) ×2 IMPLANT
BNDG ESMARK 6X9 LF (GAUZE/BANDAGES/DRESSINGS) ×2
BOWL SMART MIX CTS (DISPOSABLE) ×2 IMPLANT
CEMENT BONE SIMPLEX SPEEDSET (Cement) ×4 IMPLANT
CLOTH BEACON ORANGE TIMEOUT ST (SAFETY) IMPLANT
COVER SURGICAL LIGHT HANDLE (MISCELLANEOUS) ×2 IMPLANT
CUFF TOURNIQUET SINGLE 34IN LL (TOURNIQUET CUFF) ×2 IMPLANT
CUFF TOURNIQUET SINGLE 44IN (TOURNIQUET CUFF) IMPLANT
DRAPE INCISE IOBAN 66X45 STRL (DRAPES) ×2 IMPLANT
DRAPE ORTHO SPLIT 77X108 STRL (DRAPES) ×2
DRAPE PROXIMA HALF (DRAPES) ×2 IMPLANT
DRAPE SURG ORHT 6 SPLT 77X108 (DRAPES) ×2 IMPLANT
DRAPE U-SHAPE 47X51 STRL (DRAPES) ×2 IMPLANT
DURAPREP 26ML APPLICATOR (WOUND CARE) ×2 IMPLANT
ELECT REM PT RETURN 9FT ADLT (ELECTROSURGICAL) ×2
ELECTRODE REM PT RTRN 9FT ADLT (ELECTROSURGICAL) ×1 IMPLANT
EVACUATOR 1/8 PVC DRAIN (DRAIN) ×2 IMPLANT
FACESHIELD LNG OPTICON STERILE (SAFETY) ×6 IMPLANT
GAUZE XEROFORM 1X8 LF (GAUZE/BANDAGES/DRESSINGS) IMPLANT
GLOVE BIO SURGEON STRL SZ8 (GLOVE) IMPLANT
GLOVE BIOGEL PI IND STRL 6.5 (GLOVE) ×1 IMPLANT
GLOVE BIOGEL PI IND STRL 8 (GLOVE) ×2 IMPLANT
GLOVE BIOGEL PI INDICATOR 6.5 (GLOVE) ×1
GLOVE BIOGEL PI INDICATOR 8 (GLOVE) ×2
GLOVE ORTHO TXT STRL SZ7.5 (GLOVE) ×4 IMPLANT
GLOVE SURG ORTHO 8.0 STRL STRW (GLOVE) ×4 IMPLANT
GLOVE SURG SS PI 6.5 STRL IVOR (GLOVE) ×2 IMPLANT
GLOVE SURG SS PI 7.5 STRL IVOR (GLOVE) ×4 IMPLANT
GOWN STRL REUS W/ TWL LRG LVL3 (GOWN DISPOSABLE) ×3 IMPLANT
GOWN STRL REUS W/ TWL XL LVL3 (GOWN DISPOSABLE) ×1 IMPLANT
GOWN STRL REUS W/TWL LRG LVL3 (GOWN DISPOSABLE) ×3
GOWN STRL REUS W/TWL XL LVL3 (GOWN DISPOSABLE) ×1
HANDPIECE INTERPULSE COAX TIP (DISPOSABLE) ×1
IMMOBILIZER KNEE 22 (SOFTGOODS) ×2 IMPLANT
IMMOBILIZER KNEE 22 UNIV (SOFTGOODS) IMPLANT
KIT BASIN OR (CUSTOM PROCEDURE TRAY) ×2 IMPLANT
KIT ROOM TURNOVER OR (KITS) ×2 IMPLANT
KNEE/VIT E POLY LINER LEVEL 1B ×2 IMPLANT
MANIFOLD NEPTUNE II (INSTRUMENTS) ×2 IMPLANT
NEEDLE 18GX1X1/2 (RX/OR ONLY) (NEEDLE) ×2 IMPLANT
NEEDLE SPNL 18GX3.5 QUINCKE PK (NEEDLE) IMPLANT
NS IRRIG 1000ML POUR BTL (IV SOLUTION) ×2 IMPLANT
PACK TOTAL JOINT (CUSTOM PROCEDURE TRAY) ×2 IMPLANT
PAD ABD 8X10 STRL (GAUZE/BANDAGES/DRESSINGS) ×4 IMPLANT
PAD ARMBOARD 7.5X6 YLW CONV (MISCELLANEOUS) ×4 IMPLANT
PADDING CAST COTTON 6X4 STRL (CAST SUPPLIES) ×2 IMPLANT
SET HNDPC FAN SPRY TIP SCT (DISPOSABLE) ×1 IMPLANT
SET PAD KNEE POSITIONER (MISCELLANEOUS) ×2 IMPLANT
SPONGE GAUZE 4X4 12PLY (GAUZE/BANDAGES/DRESSINGS) ×2 IMPLANT
STAPLER VISISTAT 35W (STAPLE) ×2 IMPLANT
STRIP CLOSURE SKIN 1/2X4 (GAUZE/BANDAGES/DRESSINGS) ×2 IMPLANT
SUCTION FRAZIER TIP 10 FR DISP (SUCTIONS) ×2 IMPLANT
SUT MNCRL AB 4-0 PS2 18 (SUTURE) ×4 IMPLANT
SUT VIC AB 0 CT1 27 (SUTURE) ×1
SUT VIC AB 0 CT1 27XBRD ANBCTR (SUTURE) ×1 IMPLANT
SUT VIC AB 1 CT1 27 (SUTURE) ×3
SUT VIC AB 1 CT1 27XBRD ANBCTR (SUTURE) ×3 IMPLANT
SUT VIC AB 2-0 CT1 27 (SUTURE) ×2
SUT VIC AB 2-0 CT1 TAPERPNT 27 (SUTURE) ×2 IMPLANT
SUT VIC AB 4-0 PS2 27 (SUTURE) ×2 IMPLANT
SYR 50ML LL SCALE MARK (SYRINGE) ×2 IMPLANT
TOWEL OR 17X24 6PK STRL BLUE (TOWEL DISPOSABLE) ×2 IMPLANT
TOWEL OR 17X26 10 PK STRL BLUE (TOWEL DISPOSABLE) ×2 IMPLANT
TRAY FOLEY CATH 16FRSI W/METER (SET/KITS/TRAYS/PACK) ×2 IMPLANT
WATER STERILE IRR 1000ML POUR (IV SOLUTION) ×2 IMPLANT
WRAP KNEE MAXI GEL POST OP (GAUZE/BANDAGES/DRESSINGS) ×2 IMPLANT
YANKAUER SUCT BULB TIP NO VENT (SUCTIONS) ×2 IMPLANT

## 2013-09-14 NOTE — Brief Op Note (Signed)
09/14/2013  9:39 AM  PATIENT:  [name]  58 y.o. female  PRE-OPERATIVE DIAGNOSIS:  LEFT KNEE OSTEOARTHRITIS  POST-OPERATIVE DIAGNOSIS:  LEFT KNEE OSTEOARTHRITIS  PROCEDURE:  Procedure(s): LEFT TOTAL KNEE ARTHROPLASTY (Left)  SURGEON:  Surgeon(s) and Role:    * [name], MD - Primary  PHYSICIAN ASSISTANT: Benita Stabile, PA-C  ANESTHESIA:   regional and general  EBL:  Total I/O In: 1000 [I.V.:1000] Out: 130 [Urine:80; Blood:50]  BLOOD ADMINISTERED:none  DRAINS: (medium) Hemovact drain(s) in the knee joint with  Suction Open   LOCAL MEDICATIONS USED:  OTHER Experil  SPECIMEN:  No Specimen  DISPOSITION OF SPECIMEN:  N/A  COUNTS:  YES  TOURNIQUET:   Total Tourniquet Time Documented: Thigh (Left) - 66 minutes Total: Thigh (Left) - 66 minutes   DICTATION: .Other Dictation: Dictation Number [id number]  PLAN OF CARE: Admit to inpatient   PATIENT DISPOSITION:  PACU - hemodynamically stable.   Delay start of Pharmacological VTE agent (>24hrs) due to surgical blood loss or risk of bleeding: no

## 2013-09-14 NOTE — Anesthesia Procedure Notes (Addendum)
Anesthesia Regional Block:  Femoral nerve block  Pre-Anesthetic Checklist: ,, timeout performed, Correct Patient, Correct Site, Correct Laterality, Correct Procedure, Correct Position, site marked, Risks and benefits discussed,  Surgical consent,  Pre-op evaluation,  At surgeon's request and post-op pain management  Laterality: Left  Prep: chloraprep       Needles:  Injection technique: Single-shot  Needle Type: Echogenic Stimulator Needle     Needle Length: 5cm 5 cm Needle Gauge: 22 and 22 G    Additional Needles:  Procedures: ultrasound guided (picture in chart) and nerve stimulator Femoral nerve block  Nerve Stimulator or Paresthesia:  Response: quadraceps contraction, 0.45 mA,   Additional Responses:   Narrative:  Start time: 09/14/2013 7:02 AM End time: 09/14/2013 7:12 AM Injection made incrementally with aspirations every 5 mL.  Performed by: Personally  Anesthesiologist: Earnest Bailey, MD  Additional Notes: Functioning IV was confirmed and monitors were applied.  A 58mm 22ga Arrow echogenic stimulator needle was used. Sterile prep and drape,hand hygiene and sterile gloves were used. Ultrasound guidance: relevant anatomy identified, needle position confirmed, local anesthetic spread visualized around nerve(s)., vascular puncture avoided.  Image printed for medical record. Negative aspiration and negative test dose prior to incremental administration of local anesthetic. The patient tolerated the procedure well.     Procedure Name: LMA Insertion Date/Time: 09/14/2013 7:33 AM Performed by: Rush Farmer E Pre-anesthesia Checklist: Patient identified, Emergency Drugs available, Suction available, Patient being monitored and Timeout performed Patient Re-evaluated:Patient Re-evaluated prior to inductionOxygen Delivery Method: Circle system utilized Preoxygenation: Pre-oxygenation with 100% oxygen Intubation Type: IV induction LMA: LMA inserted LMA Size: 4.0 Number of  attempts: 1 Placement Confirmation: positive ETCO2 and breath sounds checked- equal and bilateral Tube secured with: Tape Dental Injury: Teeth and Oropharynx as per pre-operative assessment

## 2013-09-14 NOTE — Transfer of Care (Signed)
Immediate Anesthesia Transfer of Care Note  Patient: [name]  Procedure(s) Performed: Procedure(s): LEFT TOTAL KNEE ARTHROPLASTY (Left)  Patient Location: PACU  Anesthesia Type:General  Level of Consciousness: awake, alert  and oriented  Airway & Oxygen Therapy: Patient Spontanous Breathing and Patient connected to nasal cannula oxygen  Post-op Assessment: Report given to PACU RN, Post -op Vital signs reviewed and stable and Patient moving all extremities X 4  Post vital signs: Reviewed and stable  Complications: No apparent anesthesia complications

## 2013-09-14 NOTE — Progress Notes (Signed)
Orthopedic Tech Progress Note Patient Details:  [name] 10-23-1955 [id number]  Patient ID: [name], female   DOB: 1956-07-08, 58 y.o.   MRN: [id number] Placed pt's lle in cpm@ 0-60 degrees @ 2150  [name] 09/14/2013, 9:51 PM

## 2013-09-14 NOTE — Progress Notes (Signed)
Orthopedic Tech Progress Note Patient Details:  [name] 04-24-1956 [id number] CPM applied to Left LE with appropriate settings. OHF applied to bed.  CPM Left Knee CPM Left Knee: On Left Knee Flexion (Degrees): 60 Left Knee Extension (Degrees): 0   Asia R Thompson 09/14/2013, 10:34 AM

## 2013-09-14 NOTE — Anesthesia Preprocedure Evaluation (Addendum)
Anesthesia Evaluation  Patient identified by MRN, date of birth, ID band Patient awake    Reviewed: Allergy & Precautions, H&P , NPO status , Patient's Chart, lab work & pertinent test results  History of Anesthesia Complications (+) PONV and history of anesthetic complications  Airway Mallampati: II TM Distance: >3 FB Neck ROM: Full    Dental  (+) Partial Upper, Teeth Intact and Dental Advisory Given   Pulmonary neg pulmonary ROS, former smoker,    Pulmonary exam normal       Cardiovascular hypertension,     Neuro/Psych  Headaches, PSYCHIATRIC DISORDERS Anxiety    GI/Hepatic Neg liver ROS, GERD-  Medicated,  Endo/Other  Hypothyroidism   Renal/GU Renal InsufficiencyRenal diseasenegative Renal ROS     Musculoskeletal   Abdominal   Peds  Hematology   Anesthesia Other Findings   Reproductive/Obstetrics                          Anesthesia Physical Anesthesia Plan  ASA: II  Anesthesia Plan: General   Post-op Pain Management:    Induction: Intravenous  Airway Management Planned: LMA  Additional Equipment:   Intra-op Plan:   Post-operative Plan: Extubation in OR  Informed Consent: I have reviewed the patients History and Physical, chart, labs and discussed the procedure including the risks, benefits and alternatives for the proposed anesthesia with the patient or authorized representative who has indicated his/her understanding and acceptance.   Dental advisory given  Plan Discussed with: CRNA, Anesthesiologist and Surgeon  Anesthesia Plan Comments:        Anesthesia Quick Evaluation

## 2013-09-14 NOTE — Preoperative (Signed)
Beta Blockers   Reason not to administer Beta Blockers:Not Applicable 

## 2013-09-14 NOTE — Progress Notes (Signed)
Orthopedic Tech Progress Note Patient Details:  [name] 05/20/1956 [id number] Patient already in [facility] for 1500 rounds Patient ID: ELLYCE LAFEVERS, female   DOB: [birth year], 58 y.o.   MRN: [id number]   Fenton Foy 09/14/2013, 2:40 PM

## 2013-09-14 NOTE — Progress Notes (Signed)
Utilization review completed.  

## 2013-09-14 NOTE — Anesthesia Postprocedure Evaluation (Signed)
Anesthesia Post Note  Patient: [name]  Procedure(s) Performed: Procedure(s) (LRB): LEFT TOTAL KNEE ARTHROPLASTY (Left)  Anesthesia type: general  Patient location: PACU  Post pain: Pain level controlled  Post assessment: Patient's Cardiovascular Status Stable  Last Vitals:  Filed Vitals:   09/14/13 1215  BP: 129/69  Pulse: 106  Temp:   Resp: 17    Post vital signs: Reviewed and stable  Level of consciousness: sedated  Complications: No apparent anesthesia complications

## 2013-09-14 NOTE — H&P (Signed)
TOTAL KNEE ADMISSION H&P  Patient is being admitted for left total knee arthroplasty.  Subjective:  Chief Complaint:left knee pain.  HPI: [name], 58 y.o. female, has a history of pain and functional disability in the left knee due to arthritis and has failed non-surgical conservative treatments for greater than 12 weeks to includeNSAID's and/or analgesics, corticosteriod injections, flexibility and strengthening excercises and activity modification.  Onset of symptoms was gradual, starting 5 years ago with gradually worsening course since that time. The patient noted prior procedures on the knee to include  arthroscopy on the left knee(s).  Patient currently rates pain in the left knee(s) at 8 out of 10 with activity. Patient has night pain, worsening of pain with activity and weight bearing, pain that interferes with activities of daily living, pain with passive range of motion, crepitus and joint swelling.  Patient has evidence of subchondral sclerosis, periarticular osteophytes and joint space narrowing by imaging studies. There is no active infection.  Patient Active Problem List   Diagnosis Date Noted  . Arthritis of left knee 09/14/2013  . Cough 08/15/2013  . Obesity (BMI 30-39.9) 07/17/2013  . Essential hypertension, benign 04/29/2013  . Wheezing 04/29/2013  . Insomnia 04/29/2013  . Unspecified vitamin D deficiency 04/29/2013  . Migraine, unspecified, without mention of intractable migraine without mention of status migrainosus 04/29/2013  . Anxiety state, unspecified 04/29/2013  . Unspecified hypothyroidism 04/29/2013  . UTI (urinary tract infection) 07/14/2012  . Other and unspecified hyperlipidemia 07/14/2012  . MVP (mitral valve prolapse) 07/14/2012  . GERD (gastroesophageal reflux disease) 07/14/2012  . Allergic rhinitis 07/14/2012  . Mitral regurgitation 07/14/2012  . S/P hysterectomy 07/14/2012   Past Medical History  Diagnosis Date  . Thyroid disease   .  Hyperlipidemia   . Allergy   . GERD (gastroesophageal reflux disease)   . Mitral valve prolapse   . Aortic insufficiency     Mild  . Mitral regurgitation   . Need for SBE (subacute bacterial endocarditis) prophylaxis   . Migraine headache   . Pilonidal cyst without mention of abscess   . Nausea alone   . PONV (postoperative nausea and vomiting)   . Hypothyroidism   . Dysrhythmia     pvc's at times  . Anxiety   . Pneumonia Aug 14 2012  . Transplanted kidney removed     donated kidney    Past Surgical History  Procedure Laterality Date  . Tonsillectomy    . Nephrectomy    . Abdominal hysterectomy    . Knee surgery    . Carpal tunnel release Right     Prescriptions prior to admission  Medication Sig Dispense Refill  . albuterol (PROVENTIL HFA;VENTOLIN HFA) 108 (90 BASE) MCG/ACT inhaler Inhale 2 puffs into the lungs every 6 (six) hours as needed for wheezing.  1 Inhaler  11  . almotriptan (AXERT) 12.5 MG tablet Take 1 tablet (12.5 mg total) by mouth as needed for migraine. may repeat in 2 hours if needed  10 tablet  11  . citalopram (CELEXA) 20 MG tablet Take 1 tablet (20 mg total) by mouth at bedtime.  90 tablet  1  . esomeprazole (NEXIUM) 40 MG capsule Take 1 capsule (40 mg total) by mouth daily before breakfast.  90 capsule  3  . ezetimibe (ZETIA) 10 MG tablet Take 1 tablet (10 mg total) by mouth daily.  90 tablet  3  . levothyroxine (SYNTHROID, LEVOTHROID) 112 MCG tablet Take 1 tablet (112 mcg total) by mouth  daily.  90 tablet  3  . methylcellulose (ARTIFICIAL TEARS) 1 % ophthalmic solution Place 2 drops into both eyes daily as needed (dry eyes).      . naproxen sodium (ANAPROX) 220 MG tablet Take 220 mg by mouth daily as needed (pain).      . Nutritional Supplements (JUICE PLUS FIBRE PO) Take 2 tablets by mouth daily.      . ondansetron (ZOFRAN) 8 MG tablet Take 1 tablet (8 mg total) by mouth every 8 (eight) hours as needed for nausea.  60 tablet  2  . rosuvastatin (CRESTOR)  10 MG tablet Take 1 tablet (10 mg total) by mouth daily.  90 tablet  3  . traMADol (ULTRAM) 50 MG tablet Take 50-100 mg by mouth every 6 (six) hours as needed for moderate pain.      Marland Kitchen triamterene-hydrochlorothiazide (MAXZIDE-25) 37.5-25 MG per tablet Take 0.5 tablets by mouth daily.  90 tablet  3  . Vitamin D, Ergocalciferol, (DRISDOL) 50000 UNITS CAPS Take 1 capsule po twice a week for 90 days  24 capsule  3  . liothyronine (CYTOMEL) 5 MCG tablet Take 1-2 tabs po q am  180 tablet  0   Allergies  Allergen Reactions  . Morphine And Related Nausea And Vomiting  . Percocet [Oxycodone-Acetaminophen]     Tachycardia, syncope    History  Substance Use Topics  . Smoking status: Former Research scientist (life sciences)  . Smokeless tobacco: Never Used  . Alcohol Use: 0.0 oz/week    1-2 Glasses of wine per week     Comment: occasional    Family History  Problem Relation Age of Onset  . Diabetes Mother   . Hypertension Mother   . Heart disease Father   . Hypertension Father   . Thyroid disease Father   . Cancer Maternal Aunt   . Cancer Maternal Aunt   . Colon cancer Neg Hx   . Esophageal cancer Neg Hx   . Rectal cancer Neg Hx   . Stomach cancer Neg Hx      Review of Systems  Musculoskeletal: Positive for joint pain.  All other systems reviewed and are negative.    Objective:  Physical Exam  Constitutional: She is oriented to person, place, and time. She appears well-developed and well-nourished.  HENT:  Head: Normocephalic and atraumatic.  Eyes: EOM are normal. Pupils are equal, round, and reactive to light.  Neck: Normal range of motion. Neck supple.  Cardiovascular: Normal rate and regular rhythm.   Respiratory: Effort normal and breath sounds normal.  GI: Soft. Bowel sounds are normal.  Musculoskeletal:       Left knee: She exhibits decreased range of motion, effusion and abnormal alignment. Tenderness found. Medial joint line and lateral joint line tenderness noted.  Neurological: She is alert  and oriented to person, place, and time.  Skin: Skin is warm and dry.  Psychiatric: She has a normal mood and affect.    Vital signs in last 24 hours: Temp:  [98 F (36.7 C)] 98 F (36.7 C) (02/05 0612) Pulse Rate:  [82] 82 (02/05 0612) Resp:  [20] 20 (02/05 0612) BP: (152)/(90) 152/90 mmHg (02/05 0612) SpO2:  [99 %] 99 % (02/05 0612)  Labs:   Estimated body mass index is 33.31 kg/(m^2) as calculated from the following:   Height as of 09/07/13: 5' 4.5" (1.638 m).   Weight as of 08/15/13: 89.359 kg (197 lb).   Imaging Review Plain radiographs demonstrate severe degenerative joint disease of the left  knee(s). The overall alignment ismild varus. The bone quality appears to be excellent for age and reported activity level.  Assessment/Plan:  End stage arthritis, left knee   The patient history, physical examination, clinical judgment of the provider and imaging studies are consistent with end stage degenerative joint disease of the left knee(s) and total knee arthroplasty is deemed medically necessary. The treatment options including medical management, injection therapy arthroscopy and arthroplasty were discussed at length. The risks and benefits of total knee arthroplasty were presented and reviewed. The risks due to aseptic loosening, infection, stiffness, patella tracking problems, thromboembolic complications and other imponderables were discussed. The patient acknowledged the explanation, agreed to proceed with the plan and consent was signed. Patient is being admitted for inpatient treatment for surgery, pain control, PT, OT, prophylactic antibiotics, VTE prophylaxis, progressive ambulation and ADL's and discharge planning. The patient is planning to be discharged home with home health services

## 2013-09-15 ENCOUNTER — Encounter (HOSPITAL_COMMUNITY): Payer: Self-pay | Admitting: Orthopaedic Surgery

## 2013-09-15 DIAGNOSIS — M25569 Pain in unspecified knee: Secondary | ICD-10-CM | POA: Diagnosis not present

## 2013-09-15 DIAGNOSIS — M1710 Unilateral primary osteoarthritis, unspecified knee: Secondary | ICD-10-CM | POA: Diagnosis not present

## 2013-09-15 LAB — BASIC METABOLIC PANEL
BUN: 15 mg/dL (ref 6–23)
CALCIUM: 8.6 mg/dL (ref 8.4–10.5)
CO2: 24 mEq/L (ref 19–32)
Chloride: 105 mEq/L (ref 96–112)
Creatinine, Ser: 0.96 mg/dL (ref 0.50–1.10)
GFR calc Af Amer: 74 mL/min — ABNORMAL LOW (ref >90)
GFR, EST NON AFRICAN AMERICAN: 64 mL/min — AB (ref >90)
GLUCOSE: 131 mg/dL — AB (ref 70–99)
Potassium: 4.2 mEq/L (ref 3.7–5.3)
Sodium: 142 mEq/L (ref 137–147)

## 2013-09-15 LAB — CBC
HCT: 34.9 % — ABNORMAL LOW (ref 36.0–46.0)
HEMOGLOBIN: 11.4 g/dL — AB (ref 12.0–15.0)
MCH: 27.9 pg (ref 26.0–34.0)
MCHC: 32.7 g/dL (ref 30.0–36.0)
MCV: 85.3 fL (ref 78.0–100.0)
Platelets: 301 10*3/uL (ref 150–400)
RBC: 4.09 MIL/uL (ref 3.87–5.11)
RDW: 15 % (ref 11.5–15.5)
WBC: 9.1 10*3/uL (ref 4.0–10.5)

## 2013-09-15 MED ORDER — TRAMADOL HCL 50 MG PO TABS: 100.0000 mg | ORAL_TABLET | Freq: Four times a day (QID) | ORAL | Status: DC | PRN

## 2013-09-15 MED ORDER — METHOCARBAMOL 500 MG PO TABS: 500.0000 mg | ORAL_TABLET | Freq: Four times a day (QID) | ORAL | Status: DC | PRN

## 2013-09-15 MED ORDER — ASPIRIN 325 MG PO TBEC: 325.0000 mg | DELAYED_RELEASE_TABLET | Freq: Two times a day (BID) | ORAL | Status: DC

## 2013-09-15 NOTE — Progress Notes (Signed)
Subjective: 1 Day Post-Op Procedure(s) (LRB): LEFT TOTAL KNEE ARTHROPLASTY (Left) Patient reports pain as moderate.  Did well overnight.  Minimal drop in H/H very minimal acute blood loss anemia from surgery.  Objective: Vital signs in last 24 hours: Temp:  [97.6 F (36.4 C)-98.5 F (36.9 C)] 98.2 F (36.8 C) (02/06 0530) Pulse Rate:  [82-108] 87 (02/06 0530) Resp:  [12-18] 18 (02/06 0530) BP: (102-140)/(52-75) 102/52 mmHg (02/06 0530) SpO2:  [93 %-100 %] 96 % (02/06 0530)  Intake/Output from previous day: 02/05 0701 - 02/06 0700 In: 1940 [P.O.:240; I.V.:1650; IV Piggyback:50] Out: 2094 [BSJGG:8366; Drains:350; Blood:50] Intake/Output this shift:     Recent Labs  09/15/13 0326  HGB 11.4*    Recent Labs  09/15/13 0326  WBC 9.1  RBC 4.09  HCT 34.9*  PLT 301    Recent Labs  09/15/13 0326  NA 142  K 4.2  CL 105  CO2 24  BUN 15  CREATININE 0.96  GLUCOSE 131*  CALCIUM 8.6   No results found for this basename: LABPT, INR,  in the last 72 hours  Sensation intact distally Intact pulses distally Dorsiflexion/Plantar flexion intact Incision: dressing C/D/I Compartment soft  Assessment/Plan: 1 Day Post-Op Procedure(s) (LRB): LEFT TOTAL KNEE ARTHROPLASTY (Left) Up with therapy D/C hemovac  Kariann Wecker Y 09/15/2013, 7:36 AM

## 2013-09-15 NOTE — Progress Notes (Signed)
Orthopedic Tech Progress Note Patient Details:  [name] 03-25-1956 [id number] Placed patient in [facility] for 1500 Ortho rounds CPM Left Knee CPM Left Knee: On Left Knee Flexion (Degrees): 70 Left Knee Extension (Degrees): 0   Asia R Thompson 09/15/2013, 2:59 PM

## 2013-09-15 NOTE — Progress Notes (Signed)
Pt is being discharged home. Pt husband is at bedside to transport pt home. Pt has been provided with discharge instructions. RN went over instructions with the pt and answered all questions that the patient has.

## 2013-09-15 NOTE — Discharge Summary (Signed)
Patient ID: [name] MRN: [id number] DOB/AGE: [birth year] 58 y.o.  Admit date: 09/14/2013 Discharge date: 09/15/2013  Admission Diagnoses:  Principal Problem:   Arthritis of left knee Active Problems:   Status post total knee replacement   Discharge Diagnoses:  Same  Past Medical History  Diagnosis Date  . Thyroid disease   . Hyperlipidemia   . Allergy   . GERD (gastroesophageal reflux disease)   . Mitral valve prolapse   . Aortic insufficiency     Mild  . Mitral regurgitation   . Need for SBE (subacute bacterial endocarditis) prophylaxis   . Migraine headache   . Pilonidal cyst without mention of abscess   . Nausea alone   . PONV (postoperative nausea and vomiting)   . Hypothyroidism   . Dysrhythmia     pvc's at times  . Anxiety   . Pneumonia Aug 14 2012  . Transplanted kidney removed     donated kidney    Surgeries: Procedure(s): LEFT TOTAL KNEE ARTHROPLASTY on 09/14/2013   Consultants:    Discharged Condition: Improved  Hospital Course: [name] is an 58 y.o. female who was admitted 09/14/2013 for operative treatment ofArthritis of left knee. Patient has severe unremitting pain that affects sleep, daily activities, and work/hobbies. After pre-op clearance the patient was taken to the operating room on 09/14/2013 and underwent  Procedure(s): LEFT TOTAL KNEE ARTHROPLASTY.    Patient was given perioperative antibiotics: Anti-infectives   Start     Dose/Rate Route Frequency Ordered Stop   09/14/13 1400  ceFAZolin (ANCEF) IVPB 1 g/50 mL premix     1 g 100 mL/hr over 30 Minutes Intravenous Every 6 hours 09/14/13 1249 09/14/13 2045   09/14/13 0600  ceFAZolin (ANCEF) IVPB 2 g/50 mL premix     2 g 100 mL/hr over 30 Minutes Intravenous On call to O.R. 09/13/13 1436 09/14/13 0740       Patient was given sequential compression devices, early ambulation, and chemoprophylaxis to prevent DVT.  Patient benefited maximally from hospital stay and there were no  complications.    Recent vital signs: Patient Vitals for the past 24 hrs:  BP Temp Pulse Resp SpO2  09/15/13 1446 90/48 mmHg 98.7 F (37.1 C) 74 20 98 %  09/15/13 0925 106/60 mmHg - - - -  09/15/13 0530 102/52 mmHg 98.2 F (36.8 C) 87 18 96 %  09/15/13 0130 121/69 mmHg 98.1 F (36.7 C) 83 18 98 %  09/14/13 2109 106/65 mmHg 98.5 F (36.9 C) 98 18 93 %  09/14/13 2000 - - - 18 93 %     Recent laboratory studies:  Recent Labs  09/15/13 0326  WBC 9.1  HGB 11.4*  HCT 34.9*  PLT 301  NA 142  K 4.2  CL 105  CO2 24  BUN 15  CREATININE 0.96  GLUCOSE 131*  CALCIUM 8.6     Discharge Medications:     Medication List    STOP taking these medications       naproxen sodium 220 MG tablet  Commonly known as:  ANAPROX      TAKE these medications       albuterol 108 (90 BASE) MCG/ACT inhaler  Commonly known as:  PROVENTIL HFA;VENTOLIN HFA  Inhale 2 puffs into the lungs every 6 (six) hours as needed for wheezing.     almotriptan 12.5 MG tablet  Commonly known as:  AXERT  Take 1 tablet (12.5 mg total) by mouth as needed for migraine. may  repeat in 2 hours if needed     aspirin 325 MG EC tablet  Take 1 tablet (325 mg total) by mouth 2 (two) times daily after a meal.     citalopram 20 MG tablet  Commonly known as:  CELEXA  Take 1 tablet (20 mg total) by mouth at bedtime.     esomeprazole 40 MG capsule  Commonly known as:  NEXIUM  Take 1 capsule (40 mg total) by mouth daily before breakfast.     ezetimibe 10 MG tablet  Commonly known as:  ZETIA  Take 1 tablet (10 mg total) by mouth daily.     JUICE PLUS FIBRE PO  Take 2 tablets by mouth daily.     levothyroxine 112 MCG tablet  Commonly known as:  SYNTHROID, LEVOTHROID  Take 1 tablet (112 mcg total) by mouth daily.     liothyronine 5 MCG tablet  Commonly known as:  CYTOMEL  Take 1-2 tabs po q am     methocarbamol 500 MG tablet  Commonly known as:  ROBAXIN  Take 1 tablet (500 mg total) by mouth every 6 (six)  hours as needed for muscle spasms.     methylcellulose 1 % ophthalmic solution  Commonly known as:  ARTIFICIAL TEARS  Place 2 drops into both eyes daily as needed (dry eyes).     ondansetron 8 MG tablet  Commonly known as:  ZOFRAN  Take 1 tablet (8 mg total) by mouth every 8 (eight) hours as needed for nausea.     rosuvastatin 10 MG tablet  Commonly known as:  CRESTOR  Take 1 tablet (10 mg total) by mouth daily.     traMADol 50 MG tablet  Commonly known as:  ULTRAM  Take 2 tablets (100 mg total) by mouth every 6 (six) hours as needed for moderate pain.     triamterene-hydrochlorothiazide 37.5-25 MG per tablet  Commonly known as:  MAXZIDE-25  Take 0.5 tablets by mouth daily.     Vitamin D (Ergocalciferol) 50000 UNITS Caps capsule  Commonly known as:  DRISDOL  Take 1 capsule po twice a week for 90 days        Diagnostic Studies: Dg Chest 2 View  09/07/2013   CLINICAL DATA:  Preop for left knee arthroplasty  EXAM: CHEST  2 VIEW  COMPARISON:  08/15/2013  FINDINGS: Cardiomediastinal silhouette is stable. No acute infiltrate or pleural effusion. No pulmonary edema. Stable degenerative changes thoracic spine.  IMPRESSION: No active cardiopulmonary disease.   Electronically Signed   By: Lahoma Crocker M.D.   On: 09/07/2013 15:50   Dg Knee Left Port  09/14/2013   CLINICAL DATA:  Postop, left knee prosthesis.  EXAM: PORTABLE LEFT KNEE - 1-2 VIEW  COMPARISON:  None.  FINDINGS: The prosthetic components are well-seated and aligned. There is no acute fracture or evidence of an operative complication.  IMPRESSION: Well-positioned left knee prosthesis.   Electronically Signed   By: Lajean Manes M.D.   On: 09/14/2013 10:24    Disposition: 01-Home or Self Care      Discharge Orders   Future Appointments Provider Department Dept Phone   09/26/2013 9:45 AM [facility] (Chest) [facility] [phone]   Future Orders Complete By Expires   Call MD / Call 911  As  directed    Comments:     If you experience chest pain or shortness of breath, CALL 911 and be transported to the hospital emergency room.  If you develope a fever  above 101 F, pus (white drainage) or increased drainage or redness at the wound, or calf pain, call your surgeon's office.   Constipation Prevention  As directed    Comments:     Drink plenty of fluids.  Prune juice may be helpful.  You may use a stool softener, such as Colace (over the counter) 100 mg twice a day.  Use MiraLax (over the counter) for constipation as needed.   Diet - low sodium heart healthy  As directed    Discharge instructions  As directed    Comments:     Increase activities as comfort allows. Full weight as tolerated. Ice and elevation for swelling. Pump your feet several times daily for circulation. You can get your current dressing wet in the shower. Wait 6 days before getting your actual incision wet. Leave the steri strips in place until they fall off.   Discharge patient  As directed    Increase activity slowly as tolerated  As directed       Follow-up Information   Follow up with [facility]. (home physical therapy, they will contact you)    Contact information:   [address] [place] [address] [phone]       Schedule an appointment as soon as possible for a visit with [name], MD.   Specialty:  Orthopedic Surgery   Contact information:   [place] [address] [phone]        Signed: [name] 09/15/2013, 3:34 PM

## 2013-09-15 NOTE — Progress Notes (Signed)
09/15/13 Spoke with patient about HHC, she chose [facility]. Contacted Benard Minturn with [facility] and set up HHPT. Patient has rolling walker, 3N1 and CPM from [facility] [place]. No other d/c needs identified. Fuller Plan RN, BSN, CCM

## 2013-09-15 NOTE — Op Note (Signed)
NAMEARIHANNA, ESTABROOK NO.:  [id number]  MEDICAL RECORD NO.:  [id number]  LOCATION:  [id number]                        FACILITY:  [facility]  PHYSICIAN:  Lind Guest. Ninfa Linden, M.D.DATE OF BIRTH:  [birth year]  DATE OF PROCEDURE:  09/14/2013 DATE OF DISCHARGE:                              OPERATIVE REPORT   PREOPERATIVE DIAGNOSES:  Severe end-stage arthritis and degenerative joint disease, left knee.  POSTOPERATIVE DIAGNOSES:  Severe end-stage arthritis and degenerative joint disease, left knee.  PROCEDURE:  Left total knee arthroplasty.  IMPLANTS:  Stryker Triathlon knee with size 3 femur, size 2 tibial tray, 9 mm polyethylene insert, size 29 patellar button.  SURGEON:  Lind Guest. Ninfa Linden, M.D.  ASSISTING:  Erskine Emery, PA-C  ANESTHESIA: 1. Left leg femoral nerve block. 2. General. 3. Exparel.  ANTIBIOTICS:  2 g IV Ancef.  BLOOD LOSS:  50 mL.  TOURNIQUET TIME:  Just over 1 hour.  COMPLICATIONS:  None.  INDICATIONS:  [name] is a 58 year old female well known to me.  She has had several years history of worsening left knee pain.  Recent MRI showed complete loss of the articular cartilage and the patellofemoral joint as well as the medial compartment of her knee.  There was significant cartilage loss on the lateral aspect of her knee as well. There was chronic meniscal tearing.  The knee has gotten worse locking on her quite a bit and burning.  At this point, she wishes to proceed with a total knee arthroplasty given the very conservative treatment. She understands the risks of acute blood loss anemia, nerve or vessel injury, fracture, infection, DVT.  She understands the goals are decreased pain, improved mobility, and overall improved quality of life.  PROCEDURE DESCRIPTION:  After informed consent was obtained and appropriate left knee was marked.  Anesthesia obtained with nerve block __________ operating room, placed supine on the operating  table. General anesthesia was then obtained.  A Foley catheter was placed. Nonsterile tourniquet was placed around her upper left leg.  The left leg was then prepped and draped from the thigh down the ankle with DuraPrep and sterile drapes including sterile stockinette.  A time-out was called, and she was identified as correct patient and correct left knee.  We then used an Esmarch to wrap out the leg and tourniquet was inflated to 300 mm of pressure.  We made a midline incision directly over the patella and carried this proximally distally and dissected down the medial to the knee joint where we performed a medial parapatellar arthrotomy.  Surprisingly, we did not find a large effusion, but we did find the knee with significant cartilage loss throughout as well as periarticular osteophytes and a varus deformity.  I cleaned the remnants of ACL, PCL, medial and lateral meniscus and clean osteophytes from the knee.  Then with the knee in flexed position, we used the extramedullary cutting guide for the tibia, to take 9 mm off the high side.  We corrected her varus and valgus in a neutral slope and then made our tibial cut.  We then went to the femur and placed intramedullary guide __________ femur through the intercondylar notch area.  We set our rotation based  off the epicondylar axis and then made our distal femoral cut at 10 mm.  We brought the knee back down extension, with her extension block in place, she was fully extended.  I released tissue medially and so she also had equal extension space from medial to lateral.  We then went back to the femur side and put a femoral sizing guide.  We chose a size 3 femur.  We then put our 4 in 1 cutting block, made our anterior and posterior cuts followed by our chamfer cuts.  We then made our femoral box cut based on the size 3 femur and drilled our lug for this.  Attention was then turned back to the tibia based on using a size 2 trial tibial  base plate.  We placed this just medial to the tibial tubercle to set our rotation.  It did cover the tibial plateau nicely, so we put our __________ post for this.  With the trial tibia and trial femur in place, we placed 9 mm trial polyethylene insert.  This brought her to full extension.  Of note, she did have a slight flexion contracture preoperative.  This also improved alignment, and the flexion-extension gaps were equal.  We then made our patellar cut, cutting from a size 21 down to a size 14 and drilled holes for patella button with all trial components in place.  I used towel clips to bring her __________.  I was pleased with her alignment and range of motion as well as stability.  We then removed all trial components.  I copiously irrigated the knee with normal saline solution using pulsatile lavage.  We placed X parole around the joint capsule itself.  We then cemented the real size 2 tibial tray from [employer]ker and the real size 3 femur.  We placed the real 9 Hulen Shouts fix bearing polyethylene insert and cemented the 29 patellar button.  Once the cement had dried, we let the tourniquet down.  Hemostasis was obtained with electrocautery.  We placed a medium Hemovac in the arthrotomy and then irrigated the knee one more time with normal saline solution.  We closed the arthrotomy with an interrupted #1 Vicryl suture to close the arthrotomy, then 0 Vicryl in the deep tissue, 2-0 Vicryl in subcutaneous tissue, 4-0 Monocryl subcuticular stitch and Steri-Strips on the skin. Well-padded sterile dressing was applied.  She was awakened, extubated, and taken to recovery room in stable condition.  All final counts correct.  There were no complications noted.  Of note, Carney Bern, PA-C, was instrumental in assisting in this case from opening to closure and with retracting as well.     Lind Guest. Ninfa Linden, M.D.     CYB/MEDQ  D:  09/14/2013  T:  09/15/2013  Job:  [id number]

## 2013-09-15 NOTE — Progress Notes (Signed)
Physical Therapy Treatment Patient Details Name: [name] MRN: [id number] DOB: 21-Aug-1955 Today's Date: 09/15/2013 Time: 2297-9892 PT Time Calculation (min): 23 min  PT Assessment / Plan / Recommendation  History of Present Illness Adm for L TKR;  PMHx anxiety, mitral valve prolapse, aortic insufficiency   PT Comments   Pt able to negotiate stairs with min guard this session, cues for sequencing.  Pt's husband present and states he feels comfortable assisting with stairs at home.  Pt with improved gait distance and mobility this afternoon.  Follow Up Recommendations  Home health PT     Does the patient have the potential to tolerate intense rehabilitation     Barriers to Discharge        Equipment Recommendations  None recommended by PT    Recommendations for Other Services    Frequency 7X/week   Progress towards PT Goals Progress towards PT goals: Progressing toward goals  Plan Current plan remains appropriate    Precautions / Restrictions Precautions Precautions: Knee Precaution Booklet Issued: No Precaution Comments: educated on no twisting on Lt knee Restrictions LLE Weight Bearing: Weight bearing as tolerated   Pertinent Vitals/Pain No c/o pain, pt wearing ice pack when PT arrived    Mobility  Bed Mobility Overal bed mobility: Needs Assistance Bed Mobility: Sidelying to Sit Sidelying to sit: Supervision General bed mobility comments: vc for sequencing Transfers Overall transfer level: Needs assistance Equipment used: Rolling walker (2 wheeled) Transfers: Sit to/from Stand Sit to Stand: Modified independent (Device/Increase time) General transfer comment: pt recalls hand placement with RW without cuing Ambulation/Gait Ambulation/Gait assistance: Supervision Ambulation Distance (Feet): 100 Feet (pt gait 100' x 2) Assistive device: Rolling walker (2 wheeled) Gait Pattern/deviations: Step-to pattern;Decreased stride length General Gait Details: cues for  step through pattern with RW, cues to use UEs to support weakness in L knee Stairs: Yes Stairs assistance: Min guard Stair Management: One rail Right Number of Stairs: 6 General stair comments: pt able to negotiate stairs with cues for sequencing.  pt with no LOB or buckling.  Pt's husband present and agrees he feels safe assisting with stair negotiation at home    Exercises Total Joint Exercises Ankle Circles/Pumps: AROM;Both;10 reps;Supine Quad Sets: AROM;Both;10 reps;Supine Long Arc Quad: AAROM;Left;Other reps (comment);Seated Knee Flexion: AROM;Left;Other reps (comment);Seated   PT Diagnosis:    PT Problem List:   PT Treatment Interventions:     PT Goals (current goals can now be found in the care plan section) Acute Rehab PT Goals Patient Stated Goal: go home this evening PT Goal Formulation: With patient Time For Goal Achievement: 09/17/13 Potential to Achieve Goals: Good  Visit Information  Last PT Received On: 09/15/13 Assistance Needed: +1 History of Present Illness: Adm for L TKR;  PMHx anxiety, mitral valve prolapse, aortic insufficiency    Subjective Data  Patient Stated Goal: go home this evening   Cognition  Cognition Arousal/Alertness: Awake/alert Behavior During Therapy: WFL for tasks assessed/performed Overall Cognitive Status: Within Functional Limits for tasks assessed    Balance  General Comments General comments (skin integrity, edema, etc.): ice pack removed for mobility; reapplied at end of session  End of Session PT - End of Session Equipment Utilized During Treatment: Gait belt Activity Tolerance: Patient tolerated treatment well Patient left: in bed;with call bell/phone within reach;with family/visitor present Nurse Communication: Mobility status CPM Left Knee CPM Left Knee: Off   GP     Annica Marinello,[place] 09/15/2013, 1:43 PM

## 2013-09-15 NOTE — Progress Notes (Signed)
Patient ID: [name], female   DOB: 1956-02-01, 58 y.o.   MRN: [id number] Doing so well can go home today.

## 2013-09-15 NOTE — Evaluation (Signed)
Physical Therapy Evaluation Patient Details Name: [name] MRN: [id number] DOB: 10/07/1955 Today's Date: 09/15/2013 Time: 1324-4010 PT Time Calculation (min): 22 min  PT Assessment / Plan / Recommendation History of Present Illness  Adm for L TKR;  PMHx anxiety, mitral valve prolapse, aortic insufficiency  Clinical Impression  Pt is s/p TKA resulting in the deficits listed below (see PT Problem List).  Pt will benefit from skilled PT to increase their independence and safety with mobility to allow discharge to the venue listed below.      PT Assessment       Follow Up Recommendations  Home health PT;Supervision for mobility/OOB    Does the patient have the potential to tolerate intense rehabilitation      Barriers to Discharge        Equipment Recommendations  None recommended by PT    Recommendations for Other Services     Frequency      Precautions / Restrictions Precautions Precautions: Knee Precaution Booklet Issued: No Precaution Comments: educated on no twisting on Lt knee Restrictions LLE Weight Bearing: Weight bearing as tolerated   Pertinent Vitals/Pain 0/10 lt knee; reports still numb from surgery SaO2 94% on RA      Mobility  Bed Mobility Overal bed mobility: Needs Assistance Bed Mobility: Sidelying to Sit Sidelying to sit: Supervision General bed mobility comments: vc for sequencing Transfers Overall transfer level: Needs assistance Equipment used: Rolling walker (2 wheeled) Transfers: Sit to/from Stand Sit to Stand: Min guard General transfer comment: vc for safe use of RW; guarding for safety due to lack of knee control Ambulation/Gait Ambulation/Gait assistance: Min assist Ambulation Distance (Feet): 60 Feet Assistive device: Rolling walker (2 wheeled) Gait Pattern/deviations: Step-to pattern;Decreased stride length General Gait Details: Lt knee buckles, however pt can control via UE support on RW; vc for pushing down through her heel  and extending tall over her LLE    Exercises Total Joint Exercises Ankle Circles/Pumps: AROM;Both;10 reps;Supine Quad Sets: AROM;Both;10 reps;Supine Long Arc Quad: AAROM;Left;Other reps (comment);Seated Knee Flexion: AROM;Left;Other reps (comment);Seated   PT Diagnosis:    PT Problem List:   PT Treatment Interventions:       PT Goals(Current goals can be found in the care plan section) Acute Rehab PT Goals Patient Stated Goal: go home this evening PT Goal Formulation: With patient Time For Goal Achievement: 09/17/13 Potential to Achieve Goals: Good  Visit Information  Last PT Received On: 09/15/13 Assistance Needed: +1 History of Present Illness: Adm for L TKR;  PMHx anxiety, mitral valve prolapse, aortic insufficiency       Prior Functioning  Home Living Family/patient expects to be discharged to:: Private residence Living Arrangements: Spouse/significant other Available Help at Discharge: Family;Available 24 hours/day (husband taking 2 weeks off work) Type of Home: House Home Access: Stairs to enter Technical brewer of Steps: 4 Entrance Stairs-Rails: Right;Left Home Layout: Two level;Bed/bath upstairs (sleeper sofa downstairs) Alternate Level Stairs-Number of Steps: 12 Alternate Level Stairs-Rails: Right;Left (1/2 way has bil rails, then one) Home Equipment: Bedside commode;Walker - 2 wheels Prior Function Level of Independence: Independent with assistive device(s) Comments: crutches at times when pain severe Communication Communication: No difficulties    Cognition  Cognition Arousal/Alertness: Awake/alert Behavior During Therapy: WFL for tasks assessed/performed Overall Cognitive Status: Within Functional Limits for tasks assessed    Extremity/Trunk Assessment Upper Extremity Assessment Upper Extremity Assessment: Overall WFL for tasks assessed Lower Extremity Assessment Lower Extremity Assessment: LLE deficits/detail LLE Deficits / Details: continued  weakness from anesthesia;  quads 1+, dorsiflexion 5/5 LLE Sensation: decreased light touch (pt reports under bandage feels numb (at knee) distal intact) Cervical / Trunk Assessment Cervical / Trunk Assessment: Normal   Balance General Comments General comments (skin integrity, edema, etc.): ice pack removed for mobility; reapplied at end of session  End of Session PT - End of Session Equipment Utilized During Treatment: Gait belt Activity Tolerance: Patient tolerated treatment well Patient left: in chair;with call bell/phone within reach;with family/visitor present Nurse Communication: Mobility status CPM Left Knee CPM Left Knee: Off  GP     Jeffrey Graefe 09/15/2013, 10:13 AM Pager [phone]

## 2013-09-15 NOTE — Plan of Care (Signed)
Problem: Consults Goal: Diagnosis- Total Joint Replacement Outcome: Completed/Met Date Met:  09/15/13 Primary Total Knee

## 2013-09-26 ENCOUNTER — Ambulatory Visit (HOSPITAL_COMMUNITY): Admission: RE | Admit: 2013-09-26 | Payer: 59 | Source: Ambulatory Visit

## 2013-10-03 ENCOUNTER — Ambulatory Visit: Payer: 59 | Attending: Orthopaedic Surgery | Admitting: Physical Therapy

## 2013-10-03 DIAGNOSIS — R609 Edema, unspecified: Secondary | ICD-10-CM | POA: Diagnosis not present

## 2013-10-03 DIAGNOSIS — M25569 Pain in unspecified knee: Secondary | ICD-10-CM | POA: Diagnosis not present

## 2013-10-03 DIAGNOSIS — K219 Gastro-esophageal reflux disease without esophagitis: Secondary | ICD-10-CM | POA: Diagnosis not present

## 2013-10-03 DIAGNOSIS — IMO0001 Reserved for inherently not codable concepts without codable children: Secondary | ICD-10-CM | POA: Insufficient documentation

## 2013-10-06 ENCOUNTER — Ambulatory Visit: Payer: 59 | Admitting: Physical Therapy

## 2013-10-06 DIAGNOSIS — IMO0001 Reserved for inherently not codable concepts without codable children: Secondary | ICD-10-CM | POA: Diagnosis not present

## 2013-10-09 ENCOUNTER — Ambulatory Visit: Payer: 59 | Attending: Orthopaedic Surgery | Admitting: Physical Therapy

## 2013-10-09 DIAGNOSIS — IMO0001 Reserved for inherently not codable concepts without codable children: Secondary | ICD-10-CM | POA: Insufficient documentation

## 2013-10-09 DIAGNOSIS — K219 Gastro-esophageal reflux disease without esophagitis: Secondary | ICD-10-CM | POA: Insufficient documentation

## 2013-10-09 DIAGNOSIS — M25569 Pain in unspecified knee: Secondary | ICD-10-CM | POA: Insufficient documentation

## 2013-10-09 DIAGNOSIS — R609 Edema, unspecified: Secondary | ICD-10-CM | POA: Insufficient documentation

## 2013-10-11 ENCOUNTER — Ambulatory Visit: Payer: 59 | Admitting: Physical Therapy

## 2013-10-13 ENCOUNTER — Ambulatory Visit: Payer: 59 | Admitting: Physical Therapy

## 2013-10-17 ENCOUNTER — Ambulatory Visit: Payer: 59 | Admitting: Physical Therapy

## 2013-10-19 ENCOUNTER — Ambulatory Visit: Payer: 59 | Admitting: Physical Therapy

## 2013-10-23 ENCOUNTER — Ambulatory Visit: Payer: 59 | Admitting: Physical Therapy

## 2013-10-25 ENCOUNTER — Ambulatory Visit: Payer: 59 | Admitting: Physical Therapy

## 2013-10-31 ENCOUNTER — Ambulatory Visit: Payer: 59 | Admitting: Physical Therapy

## 2013-11-02 ENCOUNTER — Ambulatory Visit: Payer: 59 | Admitting: Physical Therapy

## 2013-11-06 ENCOUNTER — Ambulatory Visit: Payer: 59 | Admitting: Physical Therapy

## 2013-11-08 ENCOUNTER — Ambulatory Visit: Payer: 59 | Attending: Orthopaedic Surgery | Admitting: Physical Therapy

## 2013-11-08 DIAGNOSIS — M25569 Pain in unspecified knee: Secondary | ICD-10-CM | POA: Insufficient documentation

## 2013-11-08 DIAGNOSIS — R609 Edema, unspecified: Secondary | ICD-10-CM | POA: Insufficient documentation

## 2013-11-08 DIAGNOSIS — K219 Gastro-esophageal reflux disease without esophagitis: Secondary | ICD-10-CM | POA: Insufficient documentation

## 2013-11-08 DIAGNOSIS — IMO0001 Reserved for inherently not codable concepts without codable children: Secondary | ICD-10-CM | POA: Insufficient documentation

## 2013-11-16 ENCOUNTER — Encounter: Payer: Self-pay | Admitting: Family Medicine

## 2013-11-16 ENCOUNTER — Other Ambulatory Visit: Payer: Self-pay | Admitting: Family Medicine

## 2013-11-16 ENCOUNTER — Ambulatory Visit (INDEPENDENT_AMBULATORY_CARE_PROVIDER_SITE_OTHER): Payer: 59 | Admitting: Family Medicine

## 2013-11-16 ENCOUNTER — Ambulatory Visit: Payer: 59 | Admitting: Physical Therapy

## 2013-11-16 VITALS — BP 131/71 | HR 94 | Resp 16 | Ht 65.0 in | Wt 196.0 lb

## 2013-11-16 DIAGNOSIS — R1011 Right upper quadrant pain: Secondary | ICD-10-CM

## 2013-11-16 DIAGNOSIS — B373 Candidiasis of vulva and vagina: Secondary | ICD-10-CM | POA: Insufficient documentation

## 2013-11-16 DIAGNOSIS — R748 Abnormal levels of other serum enzymes: Secondary | ICD-10-CM

## 2013-11-16 DIAGNOSIS — L739 Follicular disorder, unspecified: Secondary | ICD-10-CM | POA: Insufficient documentation

## 2013-11-16 DIAGNOSIS — B3731 Acute candidiasis of vulva and vagina: Secondary | ICD-10-CM

## 2013-11-16 DIAGNOSIS — L738 Other specified follicular disorders: Secondary | ICD-10-CM

## 2013-11-16 DIAGNOSIS — G8929 Other chronic pain: Secondary | ICD-10-CM

## 2013-11-16 LAB — COMPLETE METABOLIC PANEL WITH GFR
ALT: 18 U/L (ref 0–35)
AST: 15 U/L (ref 0–37)
Albumin: 4.2 g/dL (ref 3.5–5.2)
Alkaline Phosphatase: 76 U/L (ref 39–117)
BUN: 20 mg/dL (ref 6–23)
CO2: 28 mEq/L (ref 19–32)
Calcium: 9.6 mg/dL (ref 8.4–10.5)
Chloride: 102 mEq/L (ref 96–112)
Creat: 1.12 mg/dL — ABNORMAL HIGH (ref 0.50–1.10)
GFR, Est African American: 63 mL/min
GFR, Est Non African American: 54 mL/min — ABNORMAL LOW
Glucose, Bld: 88 mg/dL (ref 70–99)
Potassium: 4.8 mEq/L (ref 3.5–5.3)
Sodium: 138 mEq/L (ref 135–145)
Total Bilirubin: 0.4 mg/dL (ref 0.2–1.2)
Total Protein: 6.4 g/dL (ref 6.0–8.3)

## 2013-11-16 MED ORDER — MUPIROCIN 2 % EX OINT: 1.0000 "application " | TOPICAL_OINTMENT | Freq: Three times a day (TID) | CUTANEOUS | Status: AC

## 2013-11-16 MED ORDER — RIFAMPIN 300 MG PO CAPS: 600.0000 mg | ORAL_CAPSULE | Freq: Every day | ORAL | Status: AC

## 2013-11-16 MED ORDER — TERCONAZOLE 0.8 % VA CREA: 1.0000 | TOPICAL_CREAM | Freq: Every day | VAGINAL | Status: DC

## 2013-11-16 MED ORDER — DOXYCYCLINE HYCLATE 100 MG PO CAPS: 100.0000 mg | ORAL_CAPSULE | Freq: Two times a day (BID) | ORAL | Status: AC

## 2013-11-16 MED ORDER — FLUCONAZOLE 200 MG PO TABS: ORAL_TABLET | ORAL | Status: DC

## 2013-11-16 NOTE — Patient Instructions (Signed)
1)  Boil/Folliculitis - Start on the Doxy for 5 days then add the Rifampin for the last 10 days.  Apply the Bactroban 2-3 x per day.  You have meds for yeast if you develop an infection.    2)  RUQ Pain - An order was put in for a Hida Scan.    Folliculitis  Folliculitis is redness, soreness, and swelling (inflammation) of the hair follicles. This condition can occur anywhere on the body. People with weakened immune systems, diabetes, or obesity have a greater risk of getting folliculitis. CAUSES  Bacterial infection. This is the most common cause.  Fungal infection.  Viral infection.  Contact with certain chemicals, especially oils and tars. Long-term folliculitis can result from bacteria that live in the nostrils. The bacteria may trigger multiple outbreaks of folliculitis over time. SYMPTOMS Folliculitis most commonly occurs on the scalp, thighs, legs, back, buttocks, and areas where hair is shaved frequently. An early sign of folliculitis is a small, white or yellow, pus-filled, itchy lesion (pustule). These lesions appear on a red, inflamed follicle. They are usually less than 0.2 inches (5 mm) wide. When there is an infection of the follicle that goes deeper, it becomes a boil or furuncle. A group of closely packed boils creates a larger lesion (carbuncle). Carbuncles tend to occur in hairy, sweaty areas of the body. DIAGNOSIS  Your caregiver can usually tell what is wrong by doing a physical exam. A sample may be taken from one of the lesions and tested in a lab. This can help determine what is causing your folliculitis. TREATMENT  Treatment may include:  Applying warm compresses to the affected areas.  Taking antibiotic medicines orally or applying them to the skin.  Draining the lesions if they contain a large amount of pus or fluid.  Laser hair removal for cases of long-lasting folliculitis. This helps to prevent regrowth of the hair. HOME CARE INSTRUCTIONS  Apply warm  compresses to the affected areas as directed by your caregiver.  If antibiotics are prescribed, take them as directed. Finish them even if you start to feel better.  You may take over-the-counter medicines to relieve itching.  Do not shave irritated skin.  Follow up with your caregiver as directed. SEEK IMMEDIATE MEDICAL CARE IF:   You have increasing redness, swelling, or pain in the affected area.  You have a fever. MAKE SURE YOU:  Understand these instructions.  Will watch your condition.  Will get help right away if you are not doing well or get worse. Document Released: 10/05/2001 Document Revised: 01/26/2012 Document Reviewed: 10/27/2011 [facility] Patient Information 2014 [place], [place].

## 2013-11-16 NOTE — Progress Notes (Signed)
Subjective:    Patient ID: [name], female    DOB: Jan 02, 1956, 58 y.o.   MRN: [id number]  HPI  [name] is here today concerned about a boil located in her right groin.  She thinks that it came from shaving this area.  She has applied an antibiotic cream which she feels has not helped very much.  It did drain some last night and is a little less painful than it was.      Review of Systems  Constitutional: Negative for activity change, appetite change and fatigue.  Genitourinary:       Knot in groin area  All other systems reviewed and are negative.    Past Medical History  Diagnosis Date  . Thyroid disease   . Hyperlipidemia   . Allergy   . GERD (gastroesophageal reflux disease)   . Mitral valve prolapse   . Aortic insufficiency     Mild  . Mitral regurgitation   . Need for SBE (subacute bacterial endocarditis) prophylaxis   . Migraine headache   . Pilonidal cyst without mention of abscess   . Nausea alone   . PONV (postoperative nausea and vomiting)   . Hypothyroidism   . Dysrhythmia     pvc's at times  . Anxiety   . Pneumonia Aug 14 2012  . Transplanted kidney removed     donated kidney     Past Surgical History  Procedure Laterality Date  . Tonsillectomy    . Nephrectomy    . Abdominal hysterectomy    . Knee surgery    . Carpal tunnel release Right   . Total knee arthroplasty Left 09/14/2013    Procedure: LEFT TOTAL KNEE ARTHROPLASTY;  Surgeon: [name], MD;  Location: [facility];  Service: Orthopedics;  Laterality: Left;     History   Social History Narrative   Marital Status:  Married Marine scientist)   Children: 4   Pets:  Dogs (2)   Living Situation: Lives with spouse, son and his family.     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 Diabetes Mother   . Hypertension Mother   . Heart disease Father   . Hypertension Father   . Thyroid disease Father   . Cancer Maternal Aunt   . Cancer Maternal Aunt   . Colon cancer Neg Hx   . Esophageal cancer Neg Hx   . Rectal cancer Neg Hx   . Stomach cancer Neg Hx      Current Outpatient Prescriptions on File Prior to Visit  Medication Sig Dispense Refill  . albuterol (PROVENTIL HFA;VENTOLIN HFA) 108 (90 BASE) MCG/ACT inhaler Inhale 2 puffs into the lungs every 6 (six) hours as needed for wheezing.  1 Inhaler  11  . almotriptan (AXERT) 12.5 MG tablet Take 1 tablet (12.5 mg total) by mouth as needed for migraine. may repeat in 2 hours if needed  10 tablet  11  . aspirin EC 325 MG EC tablet Take 1 tablet (325 mg total) by mouth 2 (two) times daily after a meal.  30 tablet  0  . citalopram (CELEXA) 20 MG tablet Take 1 tablet (20 mg total) by mouth at bedtime.  90 tablet  1  . esomeprazole (NEXIUM) 40 MG capsule Take 1 capsule (40 mg total) by mouth daily before breakfast.  90 capsule  3  . ezetimibe (ZETIA) 10 MG tablet Take 1 tablet (10 mg total) by mouth daily.  90 tablet  3  . levothyroxine (SYNTHROID, LEVOTHROID) 112 MCG tablet Take 1 tablet (112 mcg total) by mouth daily.  90 tablet  3  . liothyronine (CYTOMEL) 5 MCG tablet Take 1-2 tabs po q am  180 tablet  0  . methylcellulose (ARTIFICIAL TEARS) 1 % ophthalmic solution Place 2 drops into both eyes daily as needed (dry eyes).      . Nutritional Supplements (JUICE PLUS FIBRE PO) Take 2 tablets by mouth daily.      . ondansetron (ZOFRAN) 8 MG tablet Take 1 tablet (8 mg total) by mouth every 8 (eight) hours as needed for nausea.  60 tablet  2  . rosuvastatin (CRESTOR) 10 MG tablet Take 1 tablet (10 mg total) by mouth daily.  90 tablet  3  . traMADol (ULTRAM) 50 MG tablet Take 2 tablets (100 mg total) by mouth every 6 (six) hours as needed for moderate pain.  60  tablet  0  . triamterene-hydrochlorothiazide (MAXZIDE-25) 37.5-25 MG per tablet Take 0.5 tablets by mouth daily.  90 tablet  3  . Vitamin D, Ergocalciferol, (DRISDOL) 50000 UNITS CAPS Take 1 capsule po twice a week for 90 days  24 capsule  3   No current facility-administered medications on file prior to visit.     Allergies  Allergen Reactions  . Morphine And Related Nausea And Vomiting  . Percocet [Oxycodone-Acetaminophen]     Tachycardia, syncope     Immunization History  Administered Date(s) Administered  . Tdap 08/11/2003  . Zoster 05/24/2012       Objective:   Physical Exam  Nursing note and vitals reviewed. Constitutional: She is oriented to person, place, and time.  Eyes: Conjunctivae are normal. No scleral icterus.  Neck: Neck supple. No thyromegaly present.  Cardiovascular: Normal rate, regular rhythm and normal heart sounds.   Pulmonary/Chest: Effort normal and breath sounds normal.  Musculoskeletal: She exhibits no edema and no tenderness.  Lymphadenopathy:    She has no cervical adenopathy.  Neurological: She is alert and oriented to person, place, and time.  Skin: Skin is warm and dry.     Psychiatric: She has a normal mood and affect. Her behavior is normal. Judgment and thought content normal.      Assessment & Plan:    Luv was seen today for boil (right groin).  Diagnoses and associated orders for this visit:  Folliculitis - doxycycline (VIBRAMYCIN) 100 MG capsule; Take 1 capsule (100 mg total) by mouth 2 (two) times daily. Take for 10 days - rifampin (RIFADIN) 300 MG capsule; Take 2 capsules (600 mg total) by mouth daily. Take for 5 days - mupirocin ointment (BACTROBAN) 2 %; Apply 1 application topically 3 (three) times daily.  Elevated liver enzymes - COMPLETE METABOLIC PANEL WITH GFR  Candidiasis of female genitalia - fluconazole (DIFLUCAN) 200 MG tablet; Take 1 tab po QOD for 7 days - terconazole (TERAZOL 3) 0.8 % vaginal cream; Place 1  applicator vaginally at bedtime. Use for 3 days  Abdominal pain, chronic, right upper quadrant - NM Hepatobiliary; Future

## 2013-11-18 DIAGNOSIS — R110 Nausea: Secondary | ICD-10-CM | POA: Insufficient documentation

## 2013-11-18 DIAGNOSIS — R748 Abnormal levels of other serum enzymes: Secondary | ICD-10-CM | POA: Insufficient documentation

## 2013-11-19 LAB — T4, FREE: Free T4: 0.88 ng/dL (ref 0.80–1.80)

## 2013-11-19 LAB — TSH: TSH: 5.333 u[IU]/mL — ABNORMAL HIGH (ref 0.350–4.500)

## 2013-11-19 LAB — T3, FREE: T3, Free: 1.9 pg/mL — ABNORMAL LOW (ref 2.3–4.2)

## 2013-11-20 ENCOUNTER — Other Ambulatory Visit: Payer: Self-pay | Admitting: Family Medicine

## 2013-11-20 ENCOUNTER — Ambulatory Visit: Payer: 59 | Admitting: Physical Therapy

## 2013-11-20 DIAGNOSIS — E039 Hypothyroidism, unspecified: Secondary | ICD-10-CM

## 2013-11-20 MED ORDER — LEVOTHYROXINE SODIUM 125 MCG PO TABS: 125.0000 ug | ORAL_TABLET | Freq: Every day | ORAL | Status: DC

## 2013-11-21 ENCOUNTER — Ambulatory Visit: Payer: 59 | Admitting: Physical Therapy

## 2013-11-21 ENCOUNTER — Encounter: Payer: 59 | Admitting: Physical Therapy

## 2013-11-22 ENCOUNTER — Ambulatory Visit: Payer: 59 | Admitting: Physical Therapy

## 2014-01-03 ENCOUNTER — Other Ambulatory Visit: Payer: Self-pay | Admitting: *Deleted

## 2014-01-03 DIAGNOSIS — R1011 Right upper quadrant pain: Secondary | ICD-10-CM

## 2014-01-03 DIAGNOSIS — E038 Other specified hypothyroidism: Secondary | ICD-10-CM

## 2014-01-03 DIAGNOSIS — G8929 Other chronic pain: Secondary | ICD-10-CM

## 2014-01-04 ENCOUNTER — Other Ambulatory Visit: Payer: 59

## 2014-01-05 LAB — TSH: TSH: 0.763 u[IU]/mL (ref 0.350–4.500)

## 2014-01-05 LAB — T3, FREE: T3, Free: 3.6 pg/mL (ref 2.3–4.2)

## 2014-01-05 LAB — T4, FREE: Free T4: 1.33 ng/dL (ref 0.80–1.80)

## 2014-01-25 ENCOUNTER — Encounter: Payer: Self-pay | Admitting: Family Medicine

## 2014-01-25 ENCOUNTER — Ambulatory Visit (INDEPENDENT_AMBULATORY_CARE_PROVIDER_SITE_OTHER): Payer: 59 | Admitting: Family Medicine

## 2014-01-25 VITALS — BP 127/80 | HR 87 | Resp 16 | Ht 65.0 in | Wt 200.0 lb

## 2014-01-25 DIAGNOSIS — I10 Essential (primary) hypertension: Secondary | ICD-10-CM

## 2014-01-25 DIAGNOSIS — J302 Other seasonal allergic rhinitis: Secondary | ICD-10-CM

## 2014-01-25 DIAGNOSIS — G43909 Migraine, unspecified, not intractable, without status migrainosus: Secondary | ICD-10-CM

## 2014-01-25 DIAGNOSIS — R110 Nausea: Secondary | ICD-10-CM

## 2014-01-25 DIAGNOSIS — J309 Allergic rhinitis, unspecified: Secondary | ICD-10-CM

## 2014-01-25 DIAGNOSIS — F411 Generalized anxiety disorder: Secondary | ICD-10-CM

## 2014-01-25 DIAGNOSIS — E039 Hypothyroidism, unspecified: Secondary | ICD-10-CM

## 2014-01-25 DIAGNOSIS — E559 Vitamin D deficiency, unspecified: Secondary | ICD-10-CM

## 2014-01-25 DIAGNOSIS — Z23 Encounter for immunization: Secondary | ICD-10-CM

## 2014-01-25 DIAGNOSIS — E785 Hyperlipidemia, unspecified: Secondary | ICD-10-CM

## 2014-01-25 DIAGNOSIS — R062 Wheezing: Secondary | ICD-10-CM

## 2014-01-25 MED ORDER — CETIRIZINE HCL 10 MG PO TABS: 10.0000 mg | ORAL_TABLET | Freq: Every day | ORAL | Status: DC

## 2014-01-25 MED ORDER — TRIAMTERENE-HCTZ 37.5-25 MG PO TABS: 0.5000 | ORAL_TABLET | Freq: Every day | ORAL | Status: DC

## 2014-01-25 MED ORDER — VITAMIN D (ERGOCALCIFEROL) 1.25 MG (50000 UNIT) PO CAPS: ORAL_CAPSULE | ORAL | Status: AC

## 2014-01-25 MED ORDER — ALMOTRIPTAN MALATE 12.5 MG PO TABS: 12.5000 mg | ORAL_TABLET | ORAL | Status: DC | PRN

## 2014-01-25 MED ORDER — ONDANSETRON HCL 8 MG PO TABS: 8.0000 mg | ORAL_TABLET | Freq: Three times a day (TID) | ORAL | Status: DC | PRN

## 2014-01-25 MED ORDER — CITALOPRAM HYDROBROMIDE 20 MG PO TABS: 20.0000 mg | ORAL_TABLET | Freq: Every day | ORAL | Status: DC

## 2014-01-25 MED ORDER — LIOTHYRONINE SODIUM 5 MCG PO TABS: 5.0000 ug | ORAL_TABLET | Freq: Every day | ORAL | Status: DC

## 2014-01-25 MED ORDER — ROSUVASTATIN CALCIUM 10 MG PO TABS: 10.0000 mg | ORAL_TABLET | Freq: Every day | ORAL | Status: DC

## 2014-01-25 MED ORDER — ALBUTEROL SULFATE HFA 108 (90 BASE) MCG/ACT IN AERS: 2.0000 | INHALATION_SPRAY | Freq: Four times a day (QID) | RESPIRATORY_TRACT | Status: DC | PRN

## 2014-01-25 MED ORDER — EZETIMIBE 10 MG PO TABS: 10.0000 mg | ORAL_TABLET | Freq: Every day | ORAL | Status: DC

## 2014-01-25 NOTE — Progress Notes (Signed)
Subjective:    Patient ID: [name], female    DOB: Mar 27, 1956, 58 y.o.   MRN: [id number]  HPI  [name] is here today to follow up on the following conditions:  1)  Hypothyroidism:  She is doing well on the combination of levothyroxine 125 mcg and Cytomel 5 mcg.    2)  Mood:  Her mood is controlled on Celexa 20 mg.   3)  Migraines:  She needs refill on the Axert and Zofran.  4)  Hyperlipidemia:  She needs a refill on her Zetia.  5)  Vitamin D Defenancy: She needs a refill on the Vit D3.    Review of Systems  Constitutional: Negative for activity change, appetite change and fatigue.  Cardiovascular: Negative for chest pain, palpitations and leg swelling.  Psychiatric/Behavioral: Negative for behavioral problems and sleep disturbance. The patient is not nervous/anxious.   All other systems reviewed and are negative.    Past Medical History  Diagnosis Date  . Thyroid disease   . Hyperlipidemia   . Allergy   . GERD (gastroesophageal reflux disease)   . Mitral valve prolapse   . Aortic insufficiency     Mild  . Mitral regurgitation   . Need for SBE (subacute bacterial endocarditis) prophylaxis   . Migraine headache   . Pilonidal cyst without mention of abscess   . Nausea alone   . PONV (postoperative nausea and vomiting)   . Hypothyroidism   . Dysrhythmia     pvc's at times  . Anxiety   . Pneumonia Aug 14 2012  . Transplanted kidney removed     donated kidney     Past Surgical History  Procedure Laterality Date  . Tonsillectomy    . Nephrectomy    . Abdominal hysterectomy    . Knee surgery    . Carpal tunnel release Right   . Total knee arthroplasty Left 09/14/2013    Procedure: LEFT TOTAL KNEE ARTHROPLASTY;  Surgeon: [name], MD;  Location: [facility];  Service: Orthopedics;  Laterality: Left;     History   Social History Narrative   Marital Status:  Married Marine scientist)   Children: 4   Pets:  Dogs (2)   Living Situation: Lives with spouse, son  and his family.     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 Diabetes Mother   . Hypertension Mother   . Heart disease Father   . Hypertension Father   . Thyroid disease Father   . Cancer Maternal Aunt   . Cancer Maternal Aunt   . Colon cancer Neg Hx   . Esophageal cancer Neg Hx   . Rectal cancer Neg Hx   . Stomach cancer Neg Hx      Current Outpatient Prescriptions on File Prior to Visit  Medication Sig Dispense Refill  . aspirin EC 325 MG EC tablet Take 1 tablet (325 mg total) by mouth 2 (two) times daily after a meal.  30 tablet  0  . esomeprazole (NEXIUM) 40 MG capsule Take 1 capsule (40 mg total) by mouth daily before  breakfast.  90 capsule  3  . levothyroxine (SYNTHROID, LEVOTHROID) 125 MCG tablet Take 1 tablet (125 mcg total) by mouth daily.  90 tablet  3  . methylcellulose (ARTIFICIAL TEARS) 1 % ophthalmic solution Place 2 drops into both eyes daily as needed (dry eyes).      . mupirocin ointment (BACTROBAN) 2 % Apply 1 application topically 3 (three) times daily.  22 g  3  . Nutritional Supplements (JUICE PLUS FIBRE PO) Take 2 tablets by mouth daily.      . traMADol (ULTRAM) 50 MG tablet Take 2 tablets (100 mg total) by mouth every 6 (six) hours as needed for moderate pain.  60 tablet  0   No current facility-administered medications on file prior to visit.     Allergies  Allergen Reactions  . Morphine And Related Nausea And Vomiting  . Percocet [Oxycodone-Acetaminophen]     Tachycardia, syncope     Immunization History  Administered Date(s) Administered  . Pneumococcal Conjugate-13 01/25/2014  . Tdap 08/11/2003  . Zoster 05/24/2012       Objective:   Physical Exam  Vitals  reviewed. Constitutional: She is oriented to person, place, and time.  Eyes: Conjunctivae are normal. No scleral icterus.  Neck: Neck supple. No thyromegaly present.  Cardiovascular: Normal rate, regular rhythm and normal heart sounds.   Pulmonary/Chest: Effort normal and breath sounds normal.  Musculoskeletal: She exhibits no edema and no tenderness.  Lymphadenopathy:    She has no cervical adenopathy.  Neurological: She is alert and oriented to person, place, and time.  Skin: Skin is warm and dry.  Psychiatric: She has a normal mood and affect. Her behavior is normal. Judgment and thought content normal.      Assessment & Plan:    [name] was seen today for medication management.  She is doing well on her current regimen.    Diagnoses and associated orders for this visit:  Other and unspecified hyperlipidemia - rosuvastatin (CRESTOR) 10 MG tablet; Take 1 tablet (10 mg total) by mouth daily. - ezetimibe (ZETIA) 10 MG tablet; Take 1 tablet (10 mg total) by mouth daily.  Anxiety state, unspecified - citalopram (CELEXA) 20 MG tablet; Take 1 tablet (20 mg total) by mouth at bedtime.  Unspecified vitamin D deficiency - Vitamin D, Ergocalciferol, (DRISDOL) 50000 UNITS CAPS capsule; Take 1 capsule po twice a week for 90 days  Essential hypertension, benign - triamterene-hydrochlorothiazide (MAXZIDE-25) 37.5-25 MG per tablet; Take 0.5 tablets by mouth daily.  Nausea alone - ondansetron (ZOFRAN) 8 MG tablet; Take 1 tablet (8 mg total) by mouth every 8 (eight) hours as needed for nausea.  Unspecified hypothyroidism Comments: Her thyroid level is perfect on her current dosage.   - liothyronine (CYTOMEL) 5 MCG tablet; Take 1 tablet (5 mcg total) by mouth daily.  Migraine, unspecified, without mention of intractable migraine without mention of status migrainosus - almotriptan (AXERT) 12.5 MG tablet; Take 1 tablet (12.5 mg total) by mouth as needed for migraine. may repeat in 2 hours if  needed  Wheezing - albuterol (PROVENTIL HFA;VENTOLIN HFA) 108 (90 BASE) MCG/ACT inhaler; Inhale 2 puffs into the lungs every 6 (six) hours as needed for wheezing.  Seasonal allergies - cetirizine (ZYRTEC) 10 MG tablet; Take 1 tablet (10 mg total) by mouth at bedtime.  Need for prophylactic vaccination against Streptococcus pneumoniae (pneumococcus) - Pneumococcal conjugate vaccine 13-valent   TIME SPENT "FACE TO FACE" WITH PATIENT -  62 MINS

## 2014-03-27 NOTE — Addendum Note (Signed)
Addended by: Ival Bible on: 03/27/2014 09:44 PM   Modules accepted: Level of Service

## 2014-05-23 ENCOUNTER — Ambulatory Visit (HOSPITAL_BASED_OUTPATIENT_CLINIC_OR_DEPARTMENT_OTHER)
Admission: RE | Admit: 2014-05-23 | Discharge: 2014-05-23 | Disposition: A | Discharge status: Home / Self Care | Payer: 59 | Source: Ambulatory Visit | Attending: Family Medicine | Admitting: Family Medicine

## 2014-05-23 ENCOUNTER — Other Ambulatory Visit (HOSPITAL_BASED_OUTPATIENT_CLINIC_OR_DEPARTMENT_OTHER): Payer: Self-pay | Admitting: Family Medicine

## 2014-05-23 DIAGNOSIS — R51 Headache: Secondary | ICD-10-CM | POA: Diagnosis not present

## 2014-05-23 DIAGNOSIS — R112 Nausea with vomiting, unspecified: Secondary | ICD-10-CM | POA: Diagnosis not present

## 2014-05-23 DIAGNOSIS — R42 Dizziness and giddiness: Secondary | ICD-10-CM | POA: Diagnosis not present

## 2014-05-23 DIAGNOSIS — R519 Headache, unspecified: Secondary | ICD-10-CM

## 2014-06-03 DIAGNOSIS — F32A Depression, unspecified: Secondary | ICD-10-CM | POA: Insufficient documentation

## 2014-06-03 DIAGNOSIS — M159 Polyosteoarthritis, unspecified: Secondary | ICD-10-CM | POA: Insufficient documentation

## 2014-06-03 DIAGNOSIS — M8949 Other hypertrophic osteoarthropathy, multiple sites: Secondary | ICD-10-CM | POA: Insufficient documentation

## 2014-06-03 DIAGNOSIS — F329 Major depressive disorder, single episode, unspecified: Secondary | ICD-10-CM | POA: Insufficient documentation

## 2014-06-03 DIAGNOSIS — F419 Anxiety disorder, unspecified: Secondary | ICD-10-CM | POA: Insufficient documentation

## 2014-11-22 ENCOUNTER — Ambulatory Visit (INDEPENDENT_AMBULATORY_CARE_PROVIDER_SITE_OTHER): Payer: 59 | Admitting: Family Medicine

## 2014-11-22 ENCOUNTER — Encounter: Payer: Self-pay | Admitting: Family Medicine

## 2014-11-22 VITALS — BP 142/89 | HR 67 | Ht 65.0 in | Wt 207.0 lb

## 2014-11-22 DIAGNOSIS — I10 Essential (primary) hypertension: Secondary | ICD-10-CM

## 2014-11-22 DIAGNOSIS — E785 Hyperlipidemia, unspecified: Secondary | ICD-10-CM | POA: Diagnosis not present

## 2014-11-22 DIAGNOSIS — E032 Hypothyroidism due to medicaments and other exogenous substances: Secondary | ICD-10-CM

## 2014-11-22 DIAGNOSIS — F411 Generalized anxiety disorder: Secondary | ICD-10-CM

## 2014-11-22 DIAGNOSIS — E031 Congenital hypothyroidism without goiter: Secondary | ICD-10-CM | POA: Diagnosis not present

## 2014-11-22 DIAGNOSIS — E039 Hypothyroidism, unspecified: Secondary | ICD-10-CM | POA: Insufficient documentation

## 2014-11-22 DIAGNOSIS — Z905 Acquired absence of kidney: Secondary | ICD-10-CM

## 2014-11-22 DIAGNOSIS — K219 Gastro-esophageal reflux disease without esophagitis: Secondary | ICD-10-CM

## 2014-11-22 DIAGNOSIS — I340 Nonrheumatic mitral (valve) insufficiency: Secondary | ICD-10-CM

## 2014-11-22 DIAGNOSIS — R110 Nausea: Secondary | ICD-10-CM

## 2014-11-22 MED ORDER — LEVOTHYROXINE SODIUM 125 MCG PO TABS: 125.0000 ug | ORAL_TABLET | Freq: Every day | ORAL | Status: DC

## 2014-11-22 MED ORDER — LISINOPRIL 20 MG PO TABS: 20.0000 mg | ORAL_TABLET | Freq: Every day | ORAL | Status: DC

## 2014-11-22 MED ORDER — LIOTHYRONINE SODIUM 5 MCG PO TABS: 5.0000 ug | ORAL_TABLET | Freq: Every day | ORAL | Status: DC

## 2014-11-22 MED ORDER — ONDANSETRON HCL 8 MG PO TABS
8.0000 mg | ORAL_TABLET | Freq: Three times a day (TID) | ORAL | Status: AC | PRN
Start: 2014-11-22 — End: 2015-11-22

## 2014-11-22 MED ORDER — CITALOPRAM HYDROBROMIDE 20 MG PO TABS: 20.0000 mg | ORAL_TABLET | Freq: Every day | ORAL | Status: DC

## 2014-11-22 MED ORDER — OMEGA-3-ACID ETHYL ESTERS 1 G PO CAPS: 2.0000 g | ORAL_CAPSULE | Freq: Two times a day (BID) | ORAL | Status: DC

## 2014-11-22 MED ORDER — ESOMEPRAZOLE MAGNESIUM 40 MG PO CPDR: 40.0000 mg | DELAYED_RELEASE_CAPSULE | Freq: Every day | ORAL | Status: DC

## 2014-11-22 NOTE — Progress Notes (Signed)
CC: [name] is a 59 y.o. 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 TSH and T4 were checked last in May. She denies any unintentional weight loss but some gradual unintentional weight gain. No skin or hair changes nor gastrointestinal complaints today. She takes levothyroxine along with liothyronine.  History of hyperlipidemia: She tells me she's been taking Zetia and Lovaza on a daily basis and stopped taking Crestor one month ago because it was causing intolerable ear pain. She had an identical response with taking Lipitor which caused her to stop it after a month. She was started on statins over 10 years ago when zetia was not beneficial for cholesterol and triglycerides. She has never had her ten-year AHA risk calculated. She has no known cardiovascular disease other than mitral regurgitation.  She tells me she has a single kidney, her left kidney was donated to her daughter a little under 2 decades ago. Her baseline creatinine ranges from 1.0-1.1.  She was once on lisinopril but this was changed to hydrochlorothiazide by former PCP that was concerned about some mild lower extremity edema. Patient denies any intolerance to lisinopril  Reports a history of anxiety that began around the time of menopause. She ran out of citalopram few months ago within a few weeks noticed that she was much more irritable towards others. She and her husband decided that she would restart citalopram and since then she is back to her normal state of health from a psychiatric standpoint denying anxiety or irritability or depression  She says that she has a history of nausea that has caused vasovagal syncope with vomiting. Provided she takes Zofran  the first few minutes after she feels nauseous this prevents her from vomiting or having syncope. This only occurs a few times a month. Nothing seems to make it better or worse other than Zofran  Many years ago she had a ultrasound of the heart due to PVCs and was found to have mild mitral regurgitation. She's requesting a referral to cardiology to follow-up on this. She denies any new palpitations or chest pain.  Review of Systems - General ROS: negative for - chills, fever, night sweats, weight loss Ophthalmic ROS: negative for - decreased vision Psychological ROS: negative for - anxiety or depression ENT ROS: negative for - hearing change, nasal congestion, tinnitus or allergies Hematological and Lymphatic ROS: negative for - bleeding problems, bruising or swollen lymph nodes Breast ROS: negative Respiratory ROS: no cough, shortness of breath, or wheezing Cardiovascular ROS: no chest pain or dyspnea on exertion Gastrointestinal ROS: no abdominal pain, change in bowel habits, or black or bloody stools Genito-Urinary ROS: negative for - genital discharge, genital ulcers, incontinence or abnormal bleeding from genitals Musculoskeletal ROS: negative for - joint pain or muscle pain Neurological ROS: negative for - headaches or memory loss Dermatological ROS: negative for lumps, mole changes, rash and skin lesion changes  Past Medical History  Diagnosis Date  . Thyroid disease   . Hyperlipidemia   . Allergy   . GERD (gastroesophageal reflux disease)   . Mitral valve prolapse   . Aortic insufficiency     Mild  . Mitral regurgitation   . Need for SBE (subacute bacterial endocarditis) prophylaxis   . Migraine headache   .  Pilonidal cyst without mention of abscess   . Nausea alone   . PONV (postoperative nausea and vomiting)   . Hypothyroidism   . Dysrhythmia     pvc's at times  . Anxiety   . Pneumonia Aug 14 2012  . Transplanted kidney removed     donated kidney    Past Surgical  History  Procedure Laterality Date  . Tonsillectomy    . Nephrectomy    . Abdominal hysterectomy    . Knee surgery    . Carpal tunnel release Right   . Total knee arthroplasty Left 09/14/2013    Procedure: LEFT TOTAL KNEE ARTHROPLASTY;  Surgeon: [name], MD;  Location: [facility];  Service: Orthopedics;  Laterality: Left;   Family History  Problem Relation Age of Onset  . Diabetes Mother   . Hypertension Mother   . Heart disease Father   . Hypertension Father   . Thyroid disease Father   . Cancer Maternal Aunt   . Cancer Maternal Aunt   . Colon cancer Neg Hx   . Esophageal cancer Neg Hx   . Rectal cancer Neg Hx   . Stomach cancer Neg Hx     History   Social History  . Marital Status: Married    Spouse Name: [name]  . Number of Children: 4  . Years of Education: 14+   Occupational History  .  [employer]   Social History Main Topics  . Smoking status: Former Smoker    Quit date: 11/22/1983  . Smokeless tobacco: Never Used  . Alcohol Use: Yes     Comment: occasional  . Drug Use: No  . Sexual Activity:    Partners: Male   Other Topics Concern  . Not on file   Social History Narrative   Marital Status:  Married Marine scientist)   Children: 4   Pets:  Dogs (2)   Living Situation: Lives with spouse, son and his family.     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 Conjunctivae clear.  Moist mucous membranes Lungs: Clear to auscultation bilaterally, no wheezing/ronchi/rales.  Comfortable work of breathing. Good air movement. Cardiac: Regular rate  and rhythm. Normal S1/S2.  No murmurs, rubs, nor gallops.   Abdomen: Obese soft nontender, no palpable masses Extremities: No peripheral edema.  Strong peripheral pulses.  Mental Status: No depression, anxiety, nor agitation. Skin: Warm and dry.  Assessment & Plan: Roanne was seen today for establish care.  Diagnoses and all orders for this visit:  Congenital hypothyroidism without goiter Orders: -     levothyroxine (SYNTHROID, LEVOTHROID) 125 MCG tablet; Take 1 tablet (125 mcg total) by mouth daily. -     liothyronine (CYTOMEL) 5 MCG tablet; Take 1 tablet (5 mcg total) by mouth daily.  Hyperlipidemia  Single kidney  Gastroesophageal reflux disease without esophagitis Orders: -     esomeprazole (NEXIUM) 40 MG capsule; Take 1 capsule (40 mg  total) by mouth daily before breakfast.  Anxiety state Orders: -     citalopram (CELEXA) 20 MG tablet; Take 1 tablet (20 mg total) by mouth at bedtime.  Nausea without vomiting Orders: -     ondansetron (ZOFRAN) 8 MG tablet; Take 1 tablet (8 mg total) by mouth every 8 (eight) hours as needed for nausea.  Mitral regurgitation Orders: -     Ambulatory referral to Cardiology  Essential hypertension, benign  Other orders -     lisinopril (PRINIVIL,ZESTRIL) 20 MG tablet; Take 1 tablet (20 mg total) by mouth daily. -     omega-3 acid ethyl esters (LOVAZA) 1 G capsule; Take 2 capsules (2 g total) by mouth 2 (two) times daily.   Hypothyroidism: Due for repeat TSH free T4, joint decision to wait until her complete physical exam next month. Clinically controlled continue current thyroid supplementation. Hyperlipidemia: I like her to wait 1 more month until we do a cholesterol panel. This will give her 2 months off of Crestor and we will see  How well zetia and Lovaza are controlling her cholesterol whether or not she needs to restart on statin. Single kidney with history of hypertension: Hypertension is uncontrolled at this time, I recommended  that she go back to lisinopril for renal protection, she is in agreement and will stop triamterene hydrochlorothiazide. Checking renal function one month after the switch. GERD: Refills for Nexium per her request Anxiety: Controlled continue citalopram Nausea: Controlled continue Zofran as needed     Return in about 4 weeks (around 12/20/2014) for CPE.

## 2014-12-10 ENCOUNTER — Ambulatory Visit: Payer: 59 | Admitting: Cardiology

## 2014-12-20 ENCOUNTER — Encounter: Payer: Self-pay | Admitting: Family Medicine

## 2014-12-20 ENCOUNTER — Ambulatory Visit (INDEPENDENT_AMBULATORY_CARE_PROVIDER_SITE_OTHER): Payer: 59 | Admitting: Family Medicine

## 2014-12-20 VITALS — BP 135/80 | HR 66 | Ht 65.0 in | Wt 207.0 lb

## 2014-12-20 DIAGNOSIS — E032 Hypothyroidism due to medicaments and other exogenous substances: Secondary | ICD-10-CM | POA: Diagnosis not present

## 2014-12-20 DIAGNOSIS — R1011 Right upper quadrant pain: Secondary | ICD-10-CM

## 2014-12-20 DIAGNOSIS — Z0000 Encounter for general adult medical examination without abnormal findings: Secondary | ICD-10-CM | POA: Diagnosis not present

## 2014-12-20 LAB — LIPID PANEL
CHOLESTEROL: 218 mg/dL — AB (ref 0–200)
HDL: 65 mg/dL (ref >=46)
LDL Cholesterol: 131 mg/dL — ABNORMAL HIGH (ref 0–99)
TRIGLYCERIDES: 112 mg/dL (ref <150)
Total CHOL/HDL Ratio: 3.4 Ratio
VLDL: 22 mg/dL (ref 0–40)

## 2014-12-20 LAB — CBC
HEMATOCRIT: 43.5 % (ref 36.0–46.0)
Hemoglobin: 14.7 g/dL (ref 12.0–15.0)
MCH: 27.9 pg (ref 26.0–34.0)
MCHC: 33.8 g/dL (ref 30.0–36.0)
MCV: 82.7 fL (ref 78.0–100.0)
MPV: 9.6 fL (ref 8.6–12.4)
Platelets: 368 10*3/uL (ref 150–400)
RBC: 5.26 MIL/uL — ABNORMAL HIGH (ref 3.87–5.11)
RDW: 15.3 % (ref 11.5–15.5)
WBC: 5.1 10*3/uL (ref 4.0–10.5)

## 2014-12-20 LAB — COMPREHENSIVE METABOLIC PANEL
ALT: 21 U/L (ref 0–35)
AST: 15 U/L (ref 0–37)
Albumin: 4.1 g/dL (ref 3.5–5.2)
Alkaline Phosphatase: 72 U/L (ref 39–117)
BILIRUBIN TOTAL: 0.6 mg/dL (ref 0.2–1.2)
BUN: 20 mg/dL (ref 6–23)
CALCIUM: 9.8 mg/dL (ref 8.4–10.5)
CO2: 24 mEq/L (ref 19–32)
CREATININE: 1.13 mg/dL — AB (ref 0.50–1.10)
Chloride: 105 mEq/L (ref 96–112)
Glucose, Bld: 92 mg/dL (ref 70–99)
Potassium: 5.2 mEq/L (ref 3.5–5.3)
Sodium: 139 mEq/L (ref 135–145)
Total Protein: 6.5 g/dL (ref 6.0–8.3)

## 2014-12-20 LAB — T4, FREE: Free T4: 1.24 ng/dL (ref 0.80–1.80)

## 2014-12-20 LAB — TSH: TSH: 1.006 u[IU]/mL (ref 0.350–4.500)

## 2014-12-20 LAB — T3, FREE: T3, Free: 2.6 pg/mL (ref 2.3–4.2)

## 2014-12-20 NOTE — Addendum Note (Signed)
Addended by: Marcial Pacas on: 12/20/2014 01:03 PM   Modules accepted: Orders

## 2014-12-20 NOTE — Progress Notes (Signed)
CC: [name] is a 59 y.o. female is here for Annual Exam   Subjective: HPI:  Colonoscopy: Up-to-date as of 2014 Papsmear: For benign reasons decades ago with normal Pap smear following this procedure. No longer needs Pap smears Mammogram: Overdue for mammogram as a fall 2014. Referral has been placed she would prefer to have it at the breast center in [facility]  Influenza Vaccine: Out of season up-to-date Pneumovax: Up-to-date Td/Tdap: UTD 20 Zoster: Up-to-date receive this a few years ago  No acute complaints today requesting complete physical exam today  Review of Systems - General ROS: negative for - chills, fever, night sweats, weight gain or weight loss Ophthalmic ROS: negative for - decreased vision Psychological ROS: negative for - anxiety or depression ENT ROS: negative for - hearing change, nasal congestion, tinnitus or allergies Hematological and Lymphatic ROS: negative for - bleeding problems, bruising or swollen lymph nodes Breast ROS: negative Respiratory ROS: no cough, shortness of breath, or wheezing Cardiovascular ROS: no chest pain or dyspnea on exertion Gastrointestinal ROS: no abdominal pain, change in bowel habits, or black or bloody stools Genito-Urinary ROS: negative for - genital discharge, genital ulcers, incontinence or abnormal bleeding from genitals Musculoskeletal ROS: negative for - joint pain or muscle pain Neurological ROS: negative for - headaches or memory loss Dermatological ROS: negative for lumps, mole changes, rash and skin lesion changes  Past Medical History  Diagnosis Date  . Thyroid disease   . Hyperlipidemia   . Allergy   . GERD (gastroesophageal reflux disease)   . Mitral valve prolapse   . Aortic insufficiency     Mild  . Mitral regurgitation   . Need for SBE (subacute bacterial endocarditis) prophylaxis   . Migraine headache   . Pilonidal cyst without mention of abscess   . Nausea alone   . PONV (postoperative nausea  and vomiting)   . Hypothyroidism   . Dysrhythmia     pvc's at times  . Anxiety   . Pneumonia Aug 14 2012  . Transplanted kidney removed     donated kidney    Past Surgical History  Procedure Laterality Date  . Tonsillectomy    . Nephrectomy    . Abdominal hysterectomy    . Knee surgery    . Carpal tunnel release Right   . Total knee arthroplasty Left 09/14/2013    Procedure: LEFT TOTAL KNEE ARTHROPLASTY;  Surgeon: [name], MD;  Location: [facility];  Service: Orthopedics;  Laterality: Left;   Family History  Problem Relation Age of Onset  . Diabetes Mother   . Hypertension Mother   . Heart disease Father   . Hypertension Father   . Thyroid disease Father   . Cancer Maternal Aunt   . Cancer Maternal Aunt   . Colon cancer Neg Hx   . Esophageal cancer Neg Hx   . Rectal cancer Neg Hx   . Stomach cancer Neg Hx     History   Social History  . Marital Status: Married    Spouse Name: [name]  . Number of Children: 4  . Years of Education: 14+   Occupational History  .  [employer]   Social History Main Topics  . Smoking status: Former Smoker    Quit date: 11/22/1983  . Smokeless tobacco: Never Used  . Alcohol Use: Yes     Comment: occasional  . Drug Use: No  . Sexual Activity:    Partners: Male   Other Topics Concern  .  Not on file   Social History Narrative   Marital Status:  Married Marine scientist)   Children: 4   Pets:  Dogs (2)   Living Situation: Lives with spouse, son and his family.     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  No nasal discharge, hearing grossly intact, TMs with good landmarks bilaterally with no middle ear abnormalities, posterior pharynx clear without oral lesions. Neck: Supple, trachea midline, no cervical nor supraclavicular adenopathy. Pulmonary: Clear to auscultation bilaterally without wheezing, rhonchi, nor rales. Cardiac: Regular rate and rhythm. Grade 1/6 systolic murmur. No rubs, nor gallops. No peripheral edema.  2+ peripheral pulses bilaterally. Abdomen: Bowel sounds normal.  No masses.  No guarding or rigidity. Right upper quadrant pain with deep palpation of the rib cage.  MSK: Grossly intact, no signs of weakness.  Full strength throughout upper and lower extremities.  Full ROM in upper and lower extremities.  No midline spinal tenderness. Neuro: Gait unremarkable, CN II-XII grossly intact.  C5-C6 Reflex 2/4 Bilaterally, L4 Reflex 2/4 Bilaterally.  Cerebellar function intact. Skin: No rashes. Psych: Alert and oriented to person/place/time.  Thought process normal. No anxiety/depression.   Assessment & Plan: [name] was seen today for annual exam.  Diagnoses and all orders for this visit:  Annual physical exam Orders: -     Lipid panel -     Comp Met (CMET) -     CBC -     MM DIGITAL SCREENING BILATERAL; Future -     TSH -     T4, free -     T3, free  Hypothyroidism due to medication Orders: -     Lipid panel -     Comp Met (CMET) -     CBC -     MM DIGITAL SCREENING BILATERAL; Future -     TSH -     T4, free -     T3, free   Healthy lifestyle interventions including but not limited to regular exercise, a healthy low fat diet, moderation of salt intake, the dangers of tobacco/alcohol/recreational drug use, nutrition supplementation, and accident avoidance were discussed with the patient and a handout was provided for future reference.  She tells me she started aware of her murmur, it was diagnosed as mitral regurgitation and mild many years ago.  She tells me  her right upper quadrant pain has been worked up with an ultrasound which was normal and a CT scan which was normal. She was planning on getting a HIDA scan but never got around to scheduling this. I will help schedule her with this not emergently.  Return in about 3 months (around 03/22/2015) for Thyroid .

## 2014-12-20 NOTE — Patient Instructions (Signed)
Dr. Willy Pinkerton's General Advice Following Your Complete Physical Exam  The Benefits of Regular Exercise: Unless you suffer from an uncontrolled cardiovascular condition, studies strongly suggest that regular exercise and physical activity will add to both the quality and length of your life.  The World Health Organization recommends 150 minutes of moderate intensity aerobic activity every week.  This is best split over 3-4 days a week, and can be as simple as a brisk walk for just over 35 minutes "most days of the week".  This type of exercise has been shown to lower LDL-Cholesterol, lower average blood sugars, lower blood pressure, lower cardiovascular disease risk, improve memory, and increase one's overall sense of wellbeing.  The addition of anaerobic (or "strength training") exercises offers additional benefits including but not limited to increased metabolism, prevention of osteoporosis, and improved overall cholesterol levels.  How Can I Strive For A Low-Fat Diet?: Current guidelines recommend that 25-35 percent of your daily energy (food) intake should come from fats.  One might ask how can this be achieved without having to dissect each meal on a daily basis?  Switch to skim or 1% milk instead of whole milk.  Focus on lean meats such as ground turkey, fresh fish, baked chicken, and lean cuts of beef as your source of dietary protein.  Limit saturated fat consumption to less than 10% of your daily caloric intake.  Limit trans fatty acid consumption primarily by limiting synthetic trans fats such as partially hydrogenated oils (Ex: fried fast foods).  Substitute olive or vegetable oil for solid fats where possible.  Moderation of Salt Intake: Provided you don't carry a diagnosis of congestive heart failure nor renal failure, I recommend a daily allowance of no more than 2300 mg of salt (sodium).  Keeping under this daily goal is associated with a decreased risk of cardiovascular events, creeping  above it can lead to elevated blood pressures and increases your risk of cardiovascular events.  Milligrams (mg) of salt is listed on all nutrition labels, and your daily intake can add up faster than you think.  Most canned and frozen dinners can pack in over half your daily salt allowance in one meal.    Lifestyle Health Risks: Certain lifestyle choices carry specific health risks.  As you may already know, tobacco use has been associated with increasing one's risk of cardiovascular disease, pulmonary disease, numerous cancers, among many other issues.  What you may not know is that there are medications and nicotine replacement strategies that can more than double your chances of successfully quitting.  I would be thrilled to help manage your quitting strategy if you currently use tobacco products.  When it comes to alcohol use, I've yet to find an "ideal" daily allowance.  Provided an individual does not have a medical condition that is exacerbated by alcohol consumption, general guidelines determine "safe drinking" as no more than two standard drinks for a man or no more than one standard drink for a female per day.  However, much debate still exists on whether any amount of alcohol consumption is technically "safe".  My general advice, keep alcohol consumption to a minimum for general health promotion.  If you or others believe that alcohol, tobacco, or recreational drug use is interfering with your life, I would be happy to provide confidential counseling regarding treatment options.  General "Over The Counter" Nutrition Advice: Postmenopausal women should aim for a daily calcium intake of 1200 mg, however a significant portion of this might already be   provided by diets including milk, yogurt, cheese, and other dairy products.  Vitamin D has been shown to help preserve bone density, prevent fatigue, and has even been shown to help reduce falls in the elderly.  Ensuring a daily intake of 800 Units of  Vitamin D is a good place to start to enjoy the above benefits, we can easily check your Vitamin D level to see if you'd potentially benefit from supplementation beyond 800 Units a day.  Folic Acid intake should be of particular concern to women of childbearing age.  Daily consumption of 400-800 mcg of Folic Acid is recommended to minimize the chance of spinal cord defects in a fetus should pregnancy occur.    For many adults, accidents still remain one of the most common culprits when it comes to cause of death.  Some of the simplest but most effective preventitive habits you can adopt include regular seatbelt use, proper helmet use, securing firearms, and regularly testing your smoke and carbon monoxide detectors.  [name] B. Mcgwire Dasaro DO [facility] [address], [address] [place], [place] [address] Phone: [phone]  

## 2015-01-03 ENCOUNTER — Ambulatory Visit (HOSPITAL_COMMUNITY)
Admission: RE | Admit: 2015-01-03 | Discharge: 2015-01-03 | Disposition: A | Discharge status: Home / Self Care | Payer: 59 | Source: Ambulatory Visit | Attending: Family Medicine | Admitting: Family Medicine

## 2015-01-03 DIAGNOSIS — R1011 Right upper quadrant pain: Secondary | ICD-10-CM | POA: Diagnosis not present

## 2015-01-03 MED ORDER — SINCALIDE 5 MCG IJ SOLR
INTRAMUSCULAR | Status: AC
  Administered 2015-01-03: 1.9 ug via INTRAVENOUS
  Filled 2015-01-03: qty 5

## 2015-01-03 MED ORDER — SINCALIDE 5 MCG IJ SOLR
0.0200 ug/kg | Freq: Once | INTRAMUSCULAR | Status: AC
  Administered 2015-01-03: 1.9 ug via INTRAVENOUS

## 2015-01-03 MED ORDER — TECHNETIUM TC 99M MEBROFENIN IV KIT
5.0000 | PACK | Freq: Once | INTRAVENOUS | Status: AC | PRN
Start: 2015-01-03 — End: 2015-01-03
  Administered 2015-01-03: 5 via INTRAVENOUS

## 2015-01-03 MED ORDER — STERILE WATER FOR INJECTION IJ SOLN
5.0000 mL | Freq: Once | INTRAMUSCULAR | Status: AC
  Administered 2015-01-03: 5 mL via INTRAMUSCULAR

## 2015-01-03 MED ORDER — STERILE WATER FOR INJECTION IJ SOLN
INTRAMUSCULAR | Status: AC
  Administered 2015-01-03: 5 mL via INTRAMUSCULAR
  Filled 2015-01-03: qty 10

## 2015-01-04 ENCOUNTER — Telehealth: Payer: Self-pay | Admitting: Family Medicine

## 2015-01-04 DIAGNOSIS — R1011 Right upper quadrant pain: Secondary | ICD-10-CM

## 2015-01-04 NOTE — Telephone Encounter (Signed)
Seth Bake, Will you please let patient know that her HIDA scan was suspicious only for the report of mild symptoms when CCK was administered to cause her gallbladder to contract.  Because of this I would recommend she meet with a general surgeon to get his or her opinion on whether or not to remove the gallbladder.  A referral has been placed.

## 2015-01-04 NOTE — Telephone Encounter (Signed)
Pt.notified

## 2015-01-09 ENCOUNTER — Ambulatory Visit (INDEPENDENT_AMBULATORY_CARE_PROVIDER_SITE_OTHER): Payer: 59 | Admitting: Cardiology

## 2015-01-09 ENCOUNTER — Encounter: Payer: Self-pay | Admitting: Cardiology

## 2015-01-09 VITALS — BP 124/80 | HR 76 | Ht 65.0 in | Wt 202.4 lb

## 2015-01-09 DIAGNOSIS — E785 Hyperlipidemia, unspecified: Secondary | ICD-10-CM | POA: Diagnosis not present

## 2015-01-09 DIAGNOSIS — I351 Nonrheumatic aortic (valve) insufficiency: Secondary | ICD-10-CM

## 2015-01-09 DIAGNOSIS — I340 Nonrheumatic mitral (valve) insufficiency: Secondary | ICD-10-CM

## 2015-01-09 DIAGNOSIS — R011 Cardiac murmur, unspecified: Secondary | ICD-10-CM

## 2015-01-09 DIAGNOSIS — R002 Palpitations: Secondary | ICD-10-CM

## 2015-01-09 NOTE — Progress Notes (Signed)
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 She was told about this murmur several years ago, mitral regurgitation. Had left knee replacement with no complication.  Denies shortness of breath, chest pain, syncope, fevers. She is trying a diet. She is had vagal experiences in the past when heaving, blood pressure can get quite low. She has had syncope from this in the past.  She is also had palpitations, PVCs. A previous doctor doubted that she could actually detect when she had her PVCs and they helped her up to a monitor and she was able to show the PVCs directly correlating with her symptoms. Both were quite astonished.  She is with hyperlipidemia, has been on Crestor and tolerated this well. She currently is only on Zetia however.    Past Medical History  Diagnosis Date  . Thyroid disease   . Hyperlipidemia   . Allergy   . GERD (gastroesophageal reflux disease)   . Mitral valve prolapse   . Aortic insufficiency     Mild  . Mitral regurgitation   . Need for SBE (subacute bacterial endocarditis) prophylaxis   . Migraine headache   . Pilonidal cyst without mention of abscess   . Nausea alone   . PONV (postoperative nausea and vomiting)   . Hypothyroidism   . Dysrhythmia     pvc's at times  . Anxiety   . Pneumonia Aug 14 2012  . Transplanted kidney removed     donated kidney    Past Surgical History  Procedure Laterality Date  . Tonsillectomy    . Nephrectomy    . Abdominal hysterectomy    . Knee surgery    . Carpal tunnel release Right   . Total knee arthroplasty Left 09/14/2013    Procedure: LEFT TOTAL KNEE ARTHROPLASTY;  Surgeon: [name], MD;  Location: [facility];  Service:  Orthopedics;  Laterality: Left;     Current Outpatient Prescriptions  Medication Sig Dispense Refill  . cetirizine (ZYRTEC) 10 MG tablet Take 1 tablet (10 mg total) by mouth at bedtime. 90 tablet 3  . citalopram (CELEXA) 20 MG tablet Take 1 tablet (20 mg total) by mouth at bedtime. 90 tablet 1  . esomeprazole (NEXIUM) 40 MG capsule Take 1 capsule (40 mg total) by mouth daily before breakfast. 90 capsule 3  . ezetimibe (ZETIA) 10 MG tablet Take 1 tablet (10 mg total) by mouth daily. 90 tablet 3  . Folic Acid-Vit J0-KXF G18 0.5-5-0.2 MG TABS Take 1 tablet by mouth daily.    Marland Kitchen levothyroxine (SYNTHROID, LEVOTHROID) 125 MCG tablet Take 1 tablet (125 mcg total) by mouth daily. 90 tablet 3  . liothyronine (CYTOMEL) 5 MCG tablet Take 1 tablet (5 mcg total) by mouth daily. 90 tablet 3  . Misc Natural Products (SUPER ENERGY HERBAL COMPLEX PO) Take by mouth 4 (four) times daily. Pt. Not sure of dose    . ondansetron (ZOFRAN) 8 MG tablet Take 1 tablet (8 mg total) by mouth every 8 (eight) hours as needed for nausea. 60 tablet 2  . Wheat Dextrin (BENEFIBER) POWD Take 2 Doses by mouth daily.    Marland Kitchen lisinopril (PRINIVIL,ZESTRIL) 20 MG tablet Take  1 tablet (20 mg total) by mouth daily. (Patient not taking: Reported on 01/09/2015) 90 tablet 3  . omega-3 acid ethyl esters (LOVAZA) 1 G capsule Take 2 capsules (2 g total) by mouth 2 (two) times daily. (Patient not taking: Reported on 01/09/2015) 180 capsule 3  . Vitamin D, Ergocalciferol, (DRISDOL) 50000 UNITS CAPS capsule Take 1 capsule po twice a week for 90 days (Patient not taking: Reported on 01/09/2015) 24 capsule 3   No current facility-administered medications for this visit.    Allergies:   Hydrocodone-acetaminophen; Lipitor; Morphine and related; and Percocet    Social History:  The patient  reports that she quit smoking about 31 years ago. She has never used smokeless tobacco. She reports that she drinks alcohol. She reports that she does not use illicit  drugs.   Family History:  The patient's family history includes Cancer in her maternal aunt and maternal aunt; Diabetes in her mother; Heart disease in her father; Hypertension in her father and mother; Thyroid disease in her father. There is no history of Colon cancer, Esophageal cancer, Rectal cancer, or Stomach cancer.  Her father had a triple a rupture, they were monitoring him at 5 cm. He also had a coronary stent.  ROS:  Please see the history of present illness.   Otherwise, review of systems are positive for snoring, occasional PVCs.   All other systems are reviewed and negative.    PHYSICAL EXAM: VS:  BP 124/80 mmHg  Pulse 76  Ht 5\' 5"  (1.651 m)  Wt 202 lb 6.4 oz (91.808 kg)  BMI 33.68 kg/m2 , BMI Body mass index is 33.68 kg/(m^2). GEN: Well nourished, well developed, in no acute distress HEENT: normal Neck: no JVD, carotid bruits, or masses Cardiac: RRR; 1/6 systolic murmur right upper sternal border as well as left lower sternal border, no rubs, or gallops,no edema  Respiratory:  clear to auscultation bilaterally, normal work of breathing GI: soft, nontender, nondistended, + BS MS: no deformity or atrophy Skin: warm and dry, no rash Neuro:  Strength and sensation are intact Psych: euthymic mood, full affect   EKG:  EKG is ordered today. The ekg ordered today demonstrates today 01/09/2015 normal sinus rhythm, no other abnormalities. Previously she states that she had had a bundle branch block.  Labs: LDL 131, HDL 65, creatinine 1.13  Recent Labs: 12/20/2014: ALT 21; BUN 20; Creatinine 1.13*; Hemoglobin 14.7; Platelets 368; Potassium 5.2; Sodium 139; TSH 1.006    Lipid Panel    Component Value Date/Time   CHOL 218* 12/20/2014 0922   TRIG 112 12/20/2014 0922   HDL 65 12/20/2014 0922   CHOLHDL 3.4 12/20/2014 0922   VLDL 22 12/20/2014 0922   LDLCALC 131* 12/20/2014 0922      Wt Readings from Last 3 Encounters:  01/09/15 202 lb 6.4 oz (91.808 kg)  12/20/14 207 lb  (93.895 kg)  11/22/14 207 lb (93.895 kg)      Other studies Reviewed: Additional studies/ records that were reviewed today include: Prior office records reviewed, lab work, lipid panel, EKG.. Review of the above records demonstrates: As above   ASSESSMENT AND PLAN:  1.  Mitral regurgitation as well as aortic regurgitation-we will check echocardiogram to check status of valvular abnormalities. If remains mild, certainly can see her back on as-needed basis or if clinical symptoms change. No need to repeat echocardiogram on a yearly basis, could consider repeating in approximately 5 years. Also, no dental antibodies needed. No prior bacterial endocarditis.  2.  PVCs/palpitations-she previously was on a beta blocker and this made her feel very fatigued. She does not have worsening symptoms with this. Continue with conservative management. Sleep. Caffeine reduction.  3. Hyperlipidemia-current lipids are reassuring. She reports that at one point she was in a cholesterol study and her small LDL particle I believe was abnormal. I would suggest that she once again go back on the Crestor. She stated that she did not have any difficulties with this statin. If this were the case, I would then stop Zetia.   Current medicines are reviewed at length with the patient today.  The patient does not have concerns regarding medicines.  The following changes have been made:  no change  Labs/ tests ordered today include:   Orders Placed This Encounter  Procedures  . EKG 12-Lead  . Echocardiogram     Disposition:   FU with [name] (ECHO results) and PRN    Signed, Candee Furbish, MD  01/09/2015 12:26 PM    [address], [place], [place]  [address] Phone: [phone]; Fax: [phone]

## 2015-01-09 NOTE — Patient Instructions (Addendum)
Medication Instructions:  Your physician recommends that you continue on your current medications as directed. Please refer to the Current Medication list given to you today.  Please discuss with your PCP about changing from Zetia to Crestor.  Testing/Procedures: Your physician has requested that you have an echocardiogram. Echocardiography is a painless test that uses sound waves to create images of your heart. It provides your doctor with information about the size and shape of your heart and how well your heart's chambers and valves are working. This procedure takes approximately one hour. There are no restrictions for this procedure.  Follow-Up: As needed or once a year if symptoms.  Thank you for choosing [facility]!!

## 2015-01-18 ENCOUNTER — Other Ambulatory Visit: Payer: Self-pay

## 2015-01-18 ENCOUNTER — Ambulatory Visit (HOSPITAL_COMMUNITY): Payer: 59 | Attending: Internal Medicine

## 2015-01-18 DIAGNOSIS — R002 Palpitations: Secondary | ICD-10-CM

## 2015-01-18 DIAGNOSIS — I358 Other nonrheumatic aortic valve disorders: Secondary | ICD-10-CM | POA: Insufficient documentation

## 2015-01-18 DIAGNOSIS — R011 Cardiac murmur, unspecified: Secondary | ICD-10-CM | POA: Diagnosis not present

## 2015-01-24 ENCOUNTER — Telehealth: Payer: Self-pay | Admitting: Cardiology

## 2015-01-24 NOTE — Telephone Encounter (Signed)
Left message for pt to call back  °

## 2015-01-24 NOTE — Telephone Encounter (Signed)
Pt aware of results of echo 

## 2015-01-24 NOTE — Telephone Encounter (Signed)
Follow Up        Pt returning Pam's phone call.

## 2015-04-03 ENCOUNTER — Telehealth: Payer: Self-pay | Admitting: Family Medicine

## 2015-04-03 MED ORDER — VITAMIN D (ERGOCALCIFEROL) 1.25 MG (50000 UNIT) PO CAPS: 50000.0000 [IU] | ORAL_CAPSULE | ORAL | Status: DC

## 2015-04-03 NOTE — Telephone Encounter (Signed)
Refill req 

## 2015-05-24 ENCOUNTER — Other Ambulatory Visit: Payer: Self-pay | Admitting: Family Medicine

## 2015-06-14 ENCOUNTER — Ambulatory Visit (INDEPENDENT_AMBULATORY_CARE_PROVIDER_SITE_OTHER): Payer: 59 | Admitting: Family Medicine

## 2015-06-14 ENCOUNTER — Encounter: Payer: Self-pay | Admitting: Family Medicine

## 2015-06-14 VITALS — BP 118/70 | HR 73 | Wt 206.0 lb

## 2015-06-14 DIAGNOSIS — H6981 Other specified disorders of Eustachian tube, right ear: Secondary | ICD-10-CM | POA: Diagnosis not present

## 2015-06-14 DIAGNOSIS — F411 Generalized anxiety disorder: Secondary | ICD-10-CM | POA: Diagnosis not present

## 2015-06-14 MED ORDER — PREDNISONE 20 MG PO TABS: ORAL_TABLET | ORAL | Status: AC

## 2015-06-14 MED ORDER — CITALOPRAM HYDROBROMIDE 20 MG PO TABS: 20.0000 mg | ORAL_TABLET | Freq: Every day | ORAL | Status: DC

## 2015-06-14 NOTE — Progress Notes (Signed)
CC: [name] is a 59 y.o. female is here for Right Ear Pain   Subjective: HPI:  Right-sided ear pain described as a pressure sensation with occasional sharp stabbing sensation. Symptoms are persistent the stabbing only occurs a few times a day and lasts a matter of seconds. Nothing makes symptoms better or worse. She's never had this before. It's been present for 1-1/2 days. No interventions as of yet. She denies hearing loss, headache, fevers, chills, nasal congestion or drainage from the ears.  She is requesting a refill of Celexa. She believes it's helping with anxiety, no known side effects. Denies thoughts of going to harm self or others nor depression.  Review Of Systems Outlined In HPI  Past Medical History  Diagnosis Date  . Thyroid disease   . Hyperlipidemia   . Allergy   . GERD (gastroesophageal reflux disease)   . Mitral valve prolapse   . Aortic insufficiency     Mild  . Mitral regurgitation   . Need for SBE (subacute bacterial endocarditis) prophylaxis   . Migraine headache   . Pilonidal cyst without mention of abscess   . Nausea alone   . PONV (postoperative nausea and vomiting)   . Hypothyroidism   . Dysrhythmia     pvc's at times  . Anxiety   . Pneumonia Aug 14 2012  . Transplanted kidney removed     donated kidney    Past Surgical History  Procedure Laterality Date  . Tonsillectomy    . Nephrectomy    . Abdominal hysterectomy    . Knee surgery    . Carpal tunnel release Right   . Total knee arthroplasty Left 09/14/2013    Procedure: LEFT TOTAL KNEE ARTHROPLASTY;  Surgeon: [name], MD;  Location: [facility];  Service: Orthopedics;  Laterality: Left;   Family History  Problem Relation Age of Onset  . Diabetes Mother   . Hypertension Mother   . Heart disease Father   . Hypertension Father   . Thyroid disease Father   . Cancer Maternal Aunt   . Cancer Maternal Aunt   . Colon cancer Neg Hx   . Esophageal cancer Neg Hx   . Rectal cancer  Neg Hx   . Stomach cancer Neg Hx     Social History   Social History  . Marital Status: Married    Spouse Name: [name]  . Number of Children: 4  . Years of Education: 14+   Occupational History  .  [employer]   Social History Main Topics  . Smoking status: Former Smoker    Quit date: 11/22/1983  . Smokeless tobacco: Never Used  . Alcohol Use: Yes     Comment: occasional  . Drug Use: No  . Sexual Activity:    Partners: Male   Other Topics Concern  . Not on file   Social History Narrative   Marital Status:  Married Marine scientist)   Children: 4   Pets:  Dogs (2)   Living Situation: Lives with spouse, son and his family.     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 Conjunctivae clear.  External ears unremarkable, canals clear with intact TMs with appropriate landmarks.  Middle ear appears open without effusion. Pink inferior turbinates.  Moist mucous membranes, pharynx without inflammation nor lesions.  Neck supple without palpable lymphadenopathy nor abnormal masses. Lungs: Clear comfortable work of breathing Extremities: No peripheral edema.  Strong peripheral pulses.  Mental Status: No depression, anxiety, nor agitation. Skin: Warm and dry.  Assessment & Plan: [name] was seen today for right ear pain.  Diagnoses and all orders for this visit:  Eustachian tube dysfunction, right -     predniSONE (DELTASONE) 20 MG tablet; Three tabs at once daily for five days.  Anxiety state  Other orders -     citalopram (CELEXA) 20 MG tablet; Take 1 tablet (20 mg total) by mouth daily.   Tympanogram  suggest eustachian tube dysfunction, normal ear canal volume. Start prednisone. Anxiety: Controlled continue daily citalopram.  Return if symptoms worsen or fail to improve.

## 2015-08-27 DIAGNOSIS — H524 Presbyopia: Secondary | ICD-10-CM | POA: Diagnosis not present

## 2015-08-27 DIAGNOSIS — H52223 Regular astigmatism, bilateral: Secondary | ICD-10-CM | POA: Diagnosis not present

## 2015-09-13 MED FILL — LISINOPRIL 20 MG TABLET: 20 | 90 days supply | Qty: 90 | Fill #3

## 2015-09-13 MED FILL — CITALOPRAM HBR 20 MG TABLET: 20 | 90 days supply | Qty: 90 | Fill #1

## 2015-10-02 ENCOUNTER — Other Ambulatory Visit: Payer: Self-pay

## 2015-10-02 DIAGNOSIS — Z1231 Encounter for screening mammogram for malignant neoplasm of breast: Secondary | ICD-10-CM

## 2015-10-04 ENCOUNTER — Encounter: Payer: Self-pay | Admitting: Family Medicine

## 2015-10-04 ENCOUNTER — Ambulatory Visit (INDEPENDENT_AMBULATORY_CARE_PROVIDER_SITE_OTHER): Payer: 59 | Admitting: Family Medicine

## 2015-10-04 VITALS — BP 137/74 | HR 91 | Wt 205.0 lb

## 2015-10-04 DIAGNOSIS — R51 Headache: Secondary | ICD-10-CM

## 2015-10-04 DIAGNOSIS — Z638 Other specified problems related to primary support group: Secondary | ICD-10-CM

## 2015-10-04 DIAGNOSIS — Z639 Problem related to primary support group, unspecified: Secondary | ICD-10-CM | POA: Diagnosis not present

## 2015-10-04 DIAGNOSIS — R519 Headache, unspecified: Secondary | ICD-10-CM

## 2015-10-04 MED ORDER — METHYLPREDNISOLONE ACETATE 80 MG/ML IJ SUSP
80.0000 mg | Freq: Once | INTRAMUSCULAR | Status: AC
  Administered 2015-10-04: 80 mg via INTRAMUSCULAR

## 2015-10-04 MED ORDER — VITAMIN D (ERGOCALCIFEROL) 1.25 MG (50000 UNIT) PO CAPS: 50000.0000 [IU] | ORAL_CAPSULE | ORAL | Status: DC

## 2015-10-04 MED ORDER — KETOROLAC TROMETHAMINE 60 MG/2ML IM SOLN
60.0000 mg | Freq: Once | INTRAMUSCULAR | Status: AC
  Administered 2015-10-04: 60 mg via INTRAMUSCULAR

## 2015-10-04 MED ORDER — ROSUVASTATIN CALCIUM 10 MG PO TABS
10.0000 mg | ORAL_TABLET | Freq: Every day | ORAL | Status: DC
Start: 2015-10-04 — End: 2017-10-19

## 2015-10-04 MED FILL — ROSUVASTATIN CALCIUM 10 MG: 10 | 90 days supply | Qty: 90 | Fill #0

## 2015-10-04 MED FILL — VIT D2 1.25 MG (50,000 UNIT: 1.25 MG | 84 days supply | Qty: 12 | Fill #0

## 2015-10-04 NOTE — Progress Notes (Signed)
CC: [name] is a 60 y.o. female is here for Headache   Subjective: HPI:   right-sided persistent headache for the last 2 weeks on a daily basis. It fluctuates from mild to moderate in severity. It's worse as the day goes on. Slightly improved temporarily if she takes  Aleve or Tylenol. It's constant without pulsatile quality. It also hurts if she moves her eyes all the way to the right. Nothing else seems to make better or worse. She denies fevers, chills, vision disturbance, dizziness, no coordination difficulty. She denies any nasal congestion, sinus pressure or postnasal drip. She woke up  With the headache once but she is not certain if the headache actually woke her up. She denies work headache of her life but does not feel like migraines that she still within the past   The family is getting stressed due to  Patient's mother's worsening dementia.   Review Of Systems Outlined In HPI  Past Medical History  Diagnosis Date  . Thyroid disease   . Hyperlipidemia   . Allergy   . GERD (gastroesophageal reflux disease)   . Mitral valve prolapse   . Aortic insufficiency     Mild  . Mitral regurgitation   . Need for SBE (subacute bacterial endocarditis) prophylaxis   . Migraine headache   . Pilonidal cyst without mention of abscess   . Nausea alone   . PONV (postoperative nausea and vomiting)   . Hypothyroidism   . Dysrhythmia     pvc's at times  . Anxiety   . Pneumonia Aug 14 2012  . Transplanted kidney removed     donated kidney    Past Surgical History  Procedure Laterality Date  . Tonsillectomy    . Nephrectomy    . Abdominal hysterectomy    . Knee surgery    . Carpal tunnel release Right   . Total knee arthroplasty Left 09/14/2013    Procedure: LEFT TOTAL KNEE ARTHROPLASTY;  Surgeon: [name], MD;  Location: [facility];  Service: Orthopedics;  Laterality: Left;   Family History  Problem Relation Age of Onset  . Diabetes Mother   . Hypertension Mother    . Heart disease Father   . Hypertension Father   . Thyroid disease Father   . Cancer Maternal Aunt   . Cancer Maternal Aunt   . Colon cancer Neg Hx   . Esophageal cancer Neg Hx   . Rectal cancer Neg Hx   . Stomach cancer Neg Hx     Social History   Social History  . Marital Status: Married    Spouse Name: [name]  . Number of Children: 4  . Years of Education: 14+   Occupational History  .  [employer]   Social History Main Topics  . Smoking status: Former Smoker    Quit date: 11/22/1983  . Smokeless tobacco: Never Used  . Alcohol Use: Yes     Comment: occasional  . Drug Use: No  . Sexual Activity:    Partners: Male   Other Topics Concern  . Not on file   Social History Narrative   Marital Status:  Married Marine scientist)   Children: 4   Pets:  Dogs (2)   Living Situation: Lives with spouse, son and his family.     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 Conjunctivae clear.  External ears unremarkable, canals clear with intact TMs with appropriate landmarks.  Middle ear appears open without effusion. Pink inferior turbinates.  Moist mucous membranes, pharynx without inflammation nor lesions.  Neck supple without palpable lymphadenopathy nor abnormal masses.  lungs: Clear comfortable work of breathing  Neuro: Cranial nerves II through XII grossly intact Extremities: No peripheral edema.  Strong peripheral pulses.  Mental Status: No depression, anxiety, nor agitation. Skin: Warm and dry.  Assessment & Plan: [name] was seen today for headache.  Diagnoses and all orders for this  visit:  Intractable headache, unspecified chronicity pattern, unspecified headache type -     ketorolac (TORADOL) injection 60 mg; Inject 2 mLs (60 mg total) into the muscle once. -     methylPREDNISolone acetate (DEPO-MEDROL) injection 80 mg; Inject 1 mL (80 mg total) into the muscle once.  Family conflict  Other orders -     Vitamin D, Ergocalciferol, (DRISDOL) 50000 units CAPS capsule; Take 1 capsule (50,000 Units total) by mouth every 7 (seven) days. Recheck Vitamin D in 3 Months -     rosuvastatin (CRESTOR) 10 MG tablet; Take 1 tablet (10 mg total) by mouth daily.    Toradol and methylprednisone here today, if not better over the weekend start 300 mg of supplemental magnesium daily. If no better by early next week next step would be neuroimaging.   Family conflict: Time was taken to discuss  Ways of helping her mother's dementia  Keep from causing patient's stress, I'd like her to bring her mom and at her convenience.  25 minutes spent face-to-face during visit today of which at least 50% was counseling or coordinating care regarding: 1. Intractable headache, unspecified chronicity pattern, unspecified headache type   2. Family conflict       Return if symptoms worsen or fail to improve.

## 2015-10-04 NOTE — Patient Instructions (Signed)
Call me on Monday if not significantly improved.

## 2015-10-10 MED FILL — ONDANSETRON HCL 8 MG TABLET: 8 | 20 days supply | Qty: 60 | Fill #1

## 2015-10-10 MED FILL — LEVOTHYROXINE 125 MCG TAB: 125 | 90 days supply | Qty: 90 | Fill #3

## 2015-10-23 ENCOUNTER — Ambulatory Visit
Admission: RE | Admit: 2015-10-23 | Discharge: 2015-10-23 | Disposition: A | Discharge status: Home / Self Care | Payer: 59 | Source: Ambulatory Visit

## 2015-10-23 DIAGNOSIS — Z1231 Encounter for screening mammogram for malignant neoplasm of breast: Secondary | ICD-10-CM | POA: Diagnosis not present

## 2015-10-25 MED FILL — ESOMEPRAZOLE MAG DR 40 MG C: 40 | 90 days supply | Qty: 90 | Fill #0

## 2015-11-20 MED FILL — LIOTHYRONINE SOD 5 MCG TAB: 5 | 90 days supply | Qty: 90 | Fill #3

## 2015-12-03 ENCOUNTER — Ambulatory Visit (INDEPENDENT_AMBULATORY_CARE_PROVIDER_SITE_OTHER): Payer: 59 | Admitting: Family Medicine

## 2015-12-03 ENCOUNTER — Encounter: Payer: Self-pay | Admitting: Family Medicine

## 2015-12-03 VITALS — BP 109/74 | HR 70 | Wt 206.0 lb

## 2015-12-03 DIAGNOSIS — R0683 Snoring: Secondary | ICD-10-CM | POA: Diagnosis not present

## 2015-12-03 DIAGNOSIS — E031 Congenital hypothyroidism without goiter: Secondary | ICD-10-CM | POA: Diagnosis not present

## 2015-12-03 DIAGNOSIS — G478 Other sleep disorders: Secondary | ICD-10-CM | POA: Diagnosis not present

## 2015-12-03 DIAGNOSIS — R062 Wheezing: Secondary | ICD-10-CM

## 2015-12-03 MED ORDER — PREDNISONE 20 MG PO TABS: ORAL_TABLET | ORAL | Status: AC

## 2015-12-03 MED ORDER — METHYLPREDNISOLONE SODIUM SUCC 125 MG IJ SOLR
125.0000 mg | Freq: Once | INTRAMUSCULAR | Status: AC
  Administered 2015-12-03: 125 mg via INTRAMUSCULAR

## 2015-12-03 MED ORDER — LIOTHYRONINE SODIUM 5 MCG PO TABS: 5.0000 ug | ORAL_TABLET | Freq: Every day | ORAL | Status: DC

## 2015-12-03 MED ORDER — CITALOPRAM HYDROBROMIDE 20 MG PO TABS: 20.0000 mg | ORAL_TABLET | Freq: Every day | ORAL | Status: DC

## 2015-12-03 MED ORDER — LEVOTHYROXINE SODIUM 125 MCG PO TABS: 125.0000 ug | ORAL_TABLET | Freq: Every day | ORAL | Status: DC

## 2015-12-03 MED FILL — predniSONE 20 MG TABS: 20 | 13 days supply | Qty: 20 | Fill #0

## 2015-12-03 MED FILL — CITALOPRAM HBR 20 MG TABLET: 20 | 90 days supply | Qty: 90 | Fill #0

## 2015-12-03 NOTE — Progress Notes (Signed)
CC: [name] is a 60 y.o. female is here for Wheezing   Subjective: HPI:  Cough and wheezing: Symptoms seem to be worse at night, they've been present for at least a week now on a daily basis. No benefit from albuterol. Nothing else seems to make symptoms better or worse. No benefit from over-the-counter Robitussin. She denies fevers or chills but does just feel "run down". Cough is productive no blood in sputum.  Even in her regular state of health her husband complaining that she snores every night and she admits that she feels fatigued most days out of the week even when not affected by the above complaints. Symptoms have been present for a few months now. Slowly getting worse. She is aware of makes symptoms better or worse.  She denies sore throat, ear pain, rash, nor headache.   Review Of Systems Outlined In HPI  Past Medical History  Diagnosis Date  . Thyroid disease   . Hyperlipidemia   . Allergy   . GERD (gastroesophageal reflux disease)   . Mitral valve prolapse   . Aortic insufficiency     Mild  . Mitral regurgitation   . Need for SBE (subacute bacterial endocarditis) prophylaxis   . Migraine headache   . Pilonidal cyst without mention of abscess   . Nausea alone   . PONV (postoperative nausea and vomiting)   . Hypothyroidism   . Dysrhythmia     pvc's at times  . Anxiety   . Pneumonia Aug 14 2012  . Transplanted kidney removed     donated kidney    Past Surgical History  Procedure Laterality Date  . Tonsillectomy    . Nephrectomy    . Abdominal hysterectomy    . Knee surgery    . Carpal tunnel release Right   . Total knee arthroplasty Left 09/14/2013    Procedure: LEFT TOTAL KNEE ARTHROPLASTY;  Surgeon: [name], MD;  Location: [facility];  Service: Orthopedics;  Laterality: Left;   Family History  Problem Relation Age of Onset  . Diabetes Mother   . Hypertension Mother   . Heart disease Father   . Hypertension Father   . Thyroid disease  Father   . Cancer Maternal Aunt   . Cancer Maternal Aunt   . Colon cancer Neg Hx   . Esophageal cancer Neg Hx   . Rectal cancer Neg Hx   . Stomach cancer Neg Hx     Social History   Social History  . Marital Status: Married    Spouse Name: [name]  . Number of Children: 4  . Years of Education: 14+   Occupational History  .  [employer]   Social History Main Topics  . Smoking status: Former Smoker    Quit date: 11/22/1983  . Smokeless tobacco: Never Used  . Alcohol Use: Yes     Comment: occasional  . Drug Use: No  . Sexual Activity:    Partners: Male   Other Topics Concern  . Not on file   Social History Narrative   Marital Status:  Married Marine scientist)   Children: 4   Pets:  Dogs (2)   Living Situation: Lives with spouse, son and his family.     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 Conjunctivae clear.  External ears unremarkable, canals clear with intact TMs with appropriate landmarks.  Middle ear appears open without effusion. Pink inferior turbinates.  Moist mucous membranes, pharynx without inflammation nor lesions.  Neck supple without palpable lymphadenopathy nor abnormal masses. Lungs:Expiratory wheezing in all lung fields to a mild degree without rhonchi or rales. Comfortable work of breathing Cardiac: Regular rate and rhythm. Normal S1/S2.  No murmurs, rubs, nor gallops.   Extremities: No peripheral edema.  Strong peripheral pulses.  Mental Status: No depression, anxiety, nor agitation. Skin: Warm and dry.  Assessment & Plan: Nakayah was seen today for  wheezing.  Diagnoses and all orders for this visit:  Congenital hypothyroidism without goiter -     levothyroxine (SYNTHROID, LEVOTHROID) 125 MCG tablet; Take 1 tablet (125 mcg total) by mouth daily. -     liothyronine (CYTOMEL) 5 MCG tablet; Take 1 tablet (5 mcg total) by mouth daily.  Snoring -     Home sleep test  Non-restorative sleep -     Home sleep test  Wheezing  Other orders -     citalopram (CELEXA) 20 MG tablet; Take 1 tablet (20 mg total) by mouth daily. -     predniSONE (DELTASONE) 20 MG tablet; Three tabs daily days 1-3, two tabs daily days 4-6, one tab daily days 7-9, half tab daily days 10-13.   She requested refills on her thyroid medication this was refilled along with Celexa.  wheezing to be addressed with prednisone taper and Solu-Medrol injection today I encouraged her to call me on Thursday if not feeling better the next step would be adding azithromycin for 5 days.   She preferred home sleep test versus sleep lab, orders have been Return if symptoms worsen or fail to improve.

## 2015-12-03 NOTE — Addendum Note (Signed)
Addended by: Delrae Alfred on: 12/03/2015 11:45 AM   Modules accepted: Orders

## 2015-12-05 ENCOUNTER — Telehealth: Payer: Self-pay

## 2015-12-05 MED ORDER — AZITHROMYCIN 250 MG PO TABS: ORAL_TABLET | ORAL | Status: AC

## 2015-12-05 MED FILL — AZITHROMYCIN 250 MG TABLET: 250 | 5 days supply | Qty: 6 | Fill #0

## 2015-12-05 NOTE — Telephone Encounter (Signed)
Azithromycin sent to [facility].  I'd offer her hycodan cough syrup but i know she's experienced nausea with hydrocodone in the past.

## 2015-12-05 NOTE — Telephone Encounter (Signed)
Pt reports that her cough isn't any better. Please advise.

## 2015-12-05 NOTE — Telephone Encounter (Signed)
Pt notified.  She stated that she have taken hycodan in the past without any problems.

## 2015-12-06 MED ORDER — HYDROCODONE-HOMATROPINE 5-1.5 MG/5ML PO SYRP: 5.0000 mL | ORAL_SOLUTION | Freq: Three times a day (TID) | ORAL | Status: DC | PRN

## 2015-12-06 MED FILL — HYDROCODONE-HOMATROPINE SYR: 5-1.5 | 8 days supply | Qty: 120 | Fill #0

## 2015-12-06 NOTE — Telephone Encounter (Signed)
[name], Rx placed in in-box ready for pickup/faxing.  

## 2015-12-06 NOTE — Telephone Encounter (Signed)
Pt.notified

## 2015-12-11 ENCOUNTER — Other Ambulatory Visit: Payer: Self-pay | Admitting: Family Medicine

## 2015-12-11 ENCOUNTER — Ambulatory Visit (HOSPITAL_BASED_OUTPATIENT_CLINIC_OR_DEPARTMENT_OTHER): Payer: 59 | Attending: Family Medicine | Admitting: Internal Medicine

## 2015-12-11 VITALS — Ht 65.0 in | Wt 200.0 lb

## 2015-12-11 DIAGNOSIS — G4733 Obstructive sleep apnea (adult) (pediatric): Secondary | ICD-10-CM | POA: Insufficient documentation

## 2015-12-11 DIAGNOSIS — R0683 Snoring: Secondary | ICD-10-CM | POA: Insufficient documentation

## 2015-12-11 MED FILL — LISINOPRIL 20 MG TABLET: 20 | 90 days supply | Qty: 90 | Fill #0

## 2015-12-21 DIAGNOSIS — R0683 Snoring: Secondary | ICD-10-CM

## 2015-12-21 DIAGNOSIS — G4733 Obstructive sleep apnea (adult) (pediatric): Secondary | ICD-10-CM | POA: Diagnosis not present

## 2015-12-21 NOTE — Procedures (Signed)
   Patient Name: [name], [name] Date: 12/10/2015 Gender: Female D.O.B: Apr 23, 1956 Age (years): 59 Referring Provider: Marcial Pacas Height (inches): 33 Interpreting Physician: Baird Lyons MD, ABSM Weight (lbs): 200 RPSGT: Jacolyn Reedy BMI: 33 MRN: [id number] Neck Size: 14.00 CLINICAL INFORMATION Sleep Study Type: unattended HST   Indication for sleep study: Non-restorative sleep, Snoring   Epworth Sleepiness Score: 16 SLEEP STUDY TECHNIQUE A multi-channel overnight portable sleep study was performed. The channels recorded were: nasal airflow, thoracic respiratory movement, and oxygen saturation with a pulse oximetry. Snoring was also monitored.  MEDICATIONS Patient self administered medications include: none reported during study.  SLEEP ARCHITECTURE Patient was studied for 420.1 minutes. The sleep efficiency was 99.2 % and the patient was supine for 87.9%. The arousal index was 0.0 per hour.  RESPIRATORY PARAMETERS The overall AHI was 51.0 per hour, with a central apnea index of 0.0 per hour. The oxygen nadir was 88% during sleep.  CARDIAC DATA Mean heart rate during sleep was 66.5 bpm.  IMPRESSIONS - Severe obstructive sleep apnea occurred during this study (AHI = 51.0/h). - No significant central sleep apnea occurred during this study (CAI = 0.0/h). - Mild oxygen desaturation was noted during this study (Min O2 = 88%). - Patient snored   DIAGNOSIS - Obstructive Sleep Apnea (327.23 [G47.33 ICD-10])  RECOMMENDATIONS - CPAP titration would be first choice for scores in this range. - Avoid alcohol, sedatives and other CNS depressants that may worsen sleep apnea and disrupt normal sleep architecture. - Sleep hygiene should be reviewed to assess factors that may improve sleep quality. - Weight management and regular exercise should be initiated or continued.  Deneise Lever Diplomate, American Board of Sleep Medicine  ELECTRONICALLY SIGNED ON:   12/21/2015, 11:52 AM [place] PH: [phone]   FX: [phone] [place]

## 2015-12-23 ENCOUNTER — Telehealth: Payer: Self-pay | Admitting: Family Medicine

## 2015-12-23 DIAGNOSIS — G4733 Obstructive sleep apnea (adult) (pediatric): Secondary | ICD-10-CM | POA: Insufficient documentation

## 2015-12-23 MED FILL — AMOXICILLIN 500 MG CAPSULE: 500 | 10 days supply | Qty: 12 | Fill #0

## 2015-12-23 NOTE — Telephone Encounter (Signed)
Will you please let patient know that her sleep test revealed that she has obstructive sleep apnea and I'd recommend that she have a cpap titration to help relieve this. Please let me know if not contacted about scheduling this by the end of the week.

## 2015-12-23 NOTE — Telephone Encounter (Signed)
Pt.notified

## 2016-01-01 ENCOUNTER — Ambulatory Visit (INDEPENDENT_AMBULATORY_CARE_PROVIDER_SITE_OTHER): Payer: 59 | Admitting: Family Medicine

## 2016-01-01 ENCOUNTER — Encounter: Payer: Self-pay | Admitting: Family Medicine

## 2016-01-01 VITALS — BP 123/81 | HR 76 | Wt 211.0 lb

## 2016-01-01 DIAGNOSIS — L723 Sebaceous cyst: Secondary | ICD-10-CM

## 2016-01-01 DIAGNOSIS — G4733 Obstructive sleep apnea (adult) (pediatric): Secondary | ICD-10-CM | POA: Diagnosis not present

## 2016-01-01 DIAGNOSIS — S161XXS Strain of muscle, fascia and tendon at neck level, sequela: Secondary | ICD-10-CM | POA: Diagnosis not present

## 2016-01-01 MED ORDER — DIAZEPAM 5 MG PO TABS: 2.5000 mg | ORAL_TABLET | Freq: Two times a day (BID) | ORAL | Status: DC | PRN

## 2016-01-01 MED ORDER — MELOXICAM 15 MG PO TABS: 15.0000 mg | ORAL_TABLET | Freq: Every day | ORAL | Status: DC

## 2016-01-01 MED ORDER — KETOROLAC TROMETHAMINE 60 MG/2ML IM SOLN
60.0000 mg | Freq: Once | INTRAMUSCULAR | Status: AC
  Administered 2016-01-01: 60 mg via INTRAMUSCULAR

## 2016-01-01 MED FILL — diazePAM 5 MG TABS: 5 | 5 days supply | Qty: 5 | Fill #0

## 2016-01-01 MED FILL — MELOXICAM 15 MG TABLET: 15 | 30 days supply | Qty: 30 | Fill #0

## 2016-01-01 NOTE — Progress Notes (Addendum)
CC: [name] is a 60 y.o. female is here for Neck Pain   Subjective: HPI:  Growth on the back of the left neck that's been excised in the past but slowly growing back. Tender to touch but otherwise not bothering her. She denies any midline neck pain.  Her major complaint today is right sided neck stiffness. Source turning her head to the left and the right but not bothering her she flexes or extends the cervical spine. She woke up 3 days ago with this pain but denies any abnormal behavior or overdoing it nor injury the day before. She denies any swelling that she feels stiff on the back right neck. Pain is nonradiating. Slight benefit from Aleve. No fever, chills, headache or photophobia.  Follow-up OSA: She will stop about her sleep study. She had an AHI of 51. She gets little benefit using her husband's CPAP machine early in the morning but she is not sure of his settings. She reports nonrestorative sleep on a daily basis. She has trouble staying awake when in quiet meetings. Seems be getting worse on a monthly basis she has a sleep study for CPAP titration next week.   Review Of Systems Outlined In HPI  Past Medical History  Diagnosis Date  . Thyroid disease   . Hyperlipidemia   . Allergy   . GERD (gastroesophageal reflux disease)   . Mitral valve prolapse   . Aortic insufficiency     Mild  . Mitral regurgitation   . Need for SBE (subacute bacterial endocarditis) prophylaxis   . Migraine headache   . Pilonidal cyst without mention of abscess   . Nausea alone   . PONV (postoperative nausea and vomiting)   . Hypothyroidism   . Dysrhythmia     pvc's at times  . Anxiety   . Pneumonia Aug 14 2012  . Transplanted kidney removed     donated kidney    Past Surgical History  Procedure Laterality Date  . Tonsillectomy    . Nephrectomy    . Abdominal hysterectomy    . Knee surgery    . Carpal tunnel release Right   . Total knee arthroplasty Left 09/14/2013    Procedure: LEFT  TOTAL KNEE ARTHROPLASTY;  Surgeon: [name], MD;  Location: [facility];  Service: Orthopedics;  Laterality: Left;   Family History  Problem Relation Age of Onset  . Diabetes Mother   . Hypertension Mother   . Heart disease Father   . Hypertension Father   . Thyroid disease Father   . Cancer Maternal Aunt   . Cancer Maternal Aunt   . Colon cancer Neg Hx   . Esophageal cancer Neg Hx   . Rectal cancer Neg Hx   . Stomach cancer Neg Hx     Social History   Social History  . Marital Status: Married    Spouse Name: [name]  . Number of Children: 4  . Years of Education: 14+   Occupational History  .     Social History Main Topics  . Smoking status: Former Smoker    Quit date: 11/22/1983  . Smokeless tobacco: Never Used  . Alcohol Use: Yes     Comment: occasional  . Drug Use: No  . Sexual Activity:    Partners: Male   Other Topics Concern  . Not on file   Social History Narrative   Marital Status:  Married Marine scientist)   Children: 4   Pets:  Dogs (2)  Living Situation: Lives with spouse, son and his family.     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 Conjunctivae clear.  Moist mucous membranes Lungs: Clear to auscultation bilaterally, no wheezing/ronchi/rales.  Comfortable work of breathing. Good air movement. Cardiac: Regular rate and rhythm. Normal S1/S2.  No murmurs, rubs, nor gallops.   Neck: No midline spinous process tenderness. There is a slightly tender sebaceous cyst to the left of C7. Right-sided superior trapezius muscles are mildly hypertonic and tender to  touch. Extremities: No peripheral edema.  Strong peripheral pulses.  Mental Status: No depression, anxiety, nor agitation. Skin: Warm and dry.  Assessment & Plan: Abel was seen today for neck pain.  Diagnoses and all orders for this visit:  Sebaceous cyst -     Ambulatory referral to Dermatology  Neck strain, sequela -     meloxicam (MOBIC) 15 MG tablet; Take 1 tablet (15 mg total) by mouth daily. -     diazepam (VALIUM) 5 MG tablet; Take 0.5-1 tablets (2.5-5 mg total) by mouth every 12 (twelve) hours as needed for anxiety.  OSA (obstructive sleep apnea)   Sebaceous cyst: Referral to dermatology for removal Neck strain: Start meloxicam and Valium at night, additionally start range of motion exercises OSA: Severe in severity, expressed my optimism that when she gets her CPAP titration and has a CPAP machine at home she'll feel much more rested every day.  25 minutes spent face-to-face during visit today of which at least 50% was counseling or coordinating care regarding: 1. Sebaceous cyst   2. Neck strain, sequela   3. OSA (obstructive sleep apnea)      Return if symptoms worsen or fail to improve.

## 2016-01-01 NOTE — Addendum Note (Signed)
Addended by: Terance Hart on: 01/01/2016 11:36 AM   Modules accepted: Orders

## 2016-01-07 ENCOUNTER — Ambulatory Visit (HOSPITAL_BASED_OUTPATIENT_CLINIC_OR_DEPARTMENT_OTHER): Payer: 59 | Attending: Family Medicine | Admitting: Internal Medicine

## 2016-01-07 ENCOUNTER — Other Ambulatory Visit: Payer: Self-pay | Admitting: Family Medicine

## 2016-01-07 VITALS — Ht 65.0 in | Wt 200.0 lb

## 2016-01-07 DIAGNOSIS — R0683 Snoring: Secondary | ICD-10-CM | POA: Diagnosis not present

## 2016-01-07 DIAGNOSIS — G4733 Obstructive sleep apnea (adult) (pediatric): Secondary | ICD-10-CM | POA: Insufficient documentation

## 2016-01-07 DIAGNOSIS — G4730 Sleep apnea, unspecified: Secondary | ICD-10-CM | POA: Diagnosis present

## 2016-01-07 MED FILL — ROSUVASTATIN CALCIUM 10 MG: 10 | 90 days supply | Qty: 90 | Fill #1

## 2016-01-07 MED FILL — LEVOTHYROXINE 125 MCG TAB: 125 | 90 days supply | Qty: 90 | Fill #0

## 2016-01-12 DIAGNOSIS — G4733 Obstructive sleep apnea (adult) (pediatric): Secondary | ICD-10-CM

## 2016-01-12 NOTE — Procedures (Signed)
   Patient Name: Tanya, Buckley Date: 01/07/2016 Gender: Female D.O.B: [birth year] Age (years): 59 Referring Provider: Marcial Pacas Height (inches): 65 Interpreting Physician: Baird Lyons MD, ABSM Weight (lbs): 200 RPSGT: Gerhard Perches BMI: 33 MRN: [id number] Neck Size: 14.00 CLINICAL INFORMATION The patient is referred for a CPAP titration to treat sleep apnea.   Date of NPSG, Split Night or HST:  Diagnostic unattended Home Sleep Test 12/29/15 AHI 51/ hr, desaturation to 88%, body weight 200 lbs  SLEEP STUDY TECHNIQUE As per the AASM Manual for the Scoring of Sleep and Associated Events v2.3 (April 2016) with a hypopnea requiring 4% desaturations. The channels recorded and monitored were frontal, central and occipital EEG, electrooculogram (EOG), submentalis EMG (chin), nasal and oral airflow, thoracic and abdominal wall motion, anterior tibialis EMG, snore microphone, electrocardiogram, and pulse oximetry. Continuous positive airway pressure (CPAP) was initiated at the beginning of the study and titrated to treat sleep-disordered breathing.  MEDICATIONS Medications taken by the patient : charted for review Medications administered by patient during sleep study : No sleep medicine administered.  TECHNICIAN COMMENTS Comments added by technician: none  Comments added by scorer: N/A RESPIRATORY PARAMETERS Optimal PAP Pressure (cm): 15 AHI at Optimal Pressure (/hr): 3.7 Overall Minimal O2 (%): 90.00 Supine % at Optimal Pressure (%): 100 Minimal O2 at Optimal Pressure (%): 91.0    SLEEP ARCHITECTURE The study was initiated at 9:48:10 PM and ended at 5:19:43 AM. Sleep onset time was 39.2 minutes and the sleep efficiency was 81.8%. The total sleep time was 369.5 minutes. The patient spent 2.44% of the night in stage N1 sleep, 38.43% in stage N2 sleep, 22.87% in stage N3 and 36.27% in REM.Stage REM latency was 90.0 minutes Wake after sleep onset was 42.9. Alpha intrusion  was absent. Supine sleep was 38.43%.  CARDIAC DATA The 2 lead EKG demonstrated sinus rhythm. The mean heart rate was 58.37 beats per minute. Other EKG findings include: None.  LEG MOVEMENT DATA The total Periodic Limb Movements of Sleep (PLMS) were 5. The PLMS index was 0.81. A PLMS index of <15 is considered normal in adults.  IMPRESSIONS - The optimal PAP pressure was 15 cm of water. - Central sleep apnea was not noted during this titration (CAI = 1.3/h). - Significant oxygen desaturations were not observed during this titration (min O2 = 90.00%). - The patient snored with Moderate snoring volume during this titration study. - No cardiac abnormalities were observed during this study. - Clinically significant periodic limb movements were not noted during this study. Arousals associated with PLMs were rare.  DIAGNOSIS - Obstructive Sleep Apnea (327.23 [G47.33 ICD-10])  RECOMMENDATIONS - Trial of CPAP therapy on 15 cm H2O with a Medium size Resmed Nasal Pillow Mask AirFit P10 mask and heated humidification. - Avoid alcohol, sedatives and other CNS depressants that may worsen sleep apnea and disrupt normal sleep architecture. - Sleep hygiene should be reviewed to assess factors that may improve sleep quality. - Weight management and regular exercise should be initiated or continued.   Deneise Lever Diplomate, American Board of Sleep Medicine  ELECTRONICALLY SIGNED ON:  01/12/2016, 8:53 AM [place] PH: [phone]   FX: [phone] [place]

## 2016-01-13 ENCOUNTER — Telehealth: Payer: Self-pay | Admitting: Family Medicine

## 2016-01-13 DIAGNOSIS — G4733 Obstructive sleep apnea (adult) (pediatric): Secondary | ICD-10-CM

## 2016-01-13 MED ORDER — AMBULATORY NON FORMULARY MEDICATION: Status: DC

## 2016-01-13 NOTE — Telephone Encounter (Signed)
Will you please let patient know that her sleep apnea was resolved with a pressure of 15cm H2O, will you please see if [facility] can help supply her with a CPAP device?

## 2016-01-13 NOTE — Telephone Encounter (Signed)
Order faxed pt notifeid

## 2016-01-16 DIAGNOSIS — D239 Other benign neoplasm of skin, unspecified: Secondary | ICD-10-CM | POA: Diagnosis not present

## 2016-01-16 DIAGNOSIS — L720 Epidermal cyst: Secondary | ICD-10-CM | POA: Diagnosis not present

## 2016-01-17 DIAGNOSIS — G4733 Obstructive sleep apnea (adult) (pediatric): Secondary | ICD-10-CM | POA: Diagnosis not present

## 2016-01-27 ENCOUNTER — Other Ambulatory Visit: Payer: Self-pay | Admitting: Family Medicine

## 2016-01-27 MED FILL — ESOMEPRAZOLE MAG DR 40 MG C: 40 | 90 days supply | Qty: 90 | Fill #0

## 2016-01-31 ENCOUNTER — Other Ambulatory Visit: Payer: Self-pay | Admitting: Physician Assistant

## 2016-01-31 DIAGNOSIS — L720 Epidermal cyst: Secondary | ICD-10-CM | POA: Diagnosis not present

## 2016-02-16 DIAGNOSIS — G4733 Obstructive sleep apnea (adult) (pediatric): Secondary | ICD-10-CM | POA: Diagnosis not present

## 2016-02-19 MED FILL — LIOTHYRONINE SOD 5 MCG TAB: 5 | 90 days supply | Qty: 90 | Fill #0

## 2016-02-28 ENCOUNTER — Ambulatory Visit (INDEPENDENT_AMBULATORY_CARE_PROVIDER_SITE_OTHER): Payer: 59 | Admitting: Family Medicine

## 2016-02-28 ENCOUNTER — Encounter: Payer: Self-pay | Admitting: Family Medicine

## 2016-02-28 VITALS — BP 107/73 | HR 99 | Wt 202.0 lb

## 2016-02-28 DIAGNOSIS — I10 Essential (primary) hypertension: Secondary | ICD-10-CM | POA: Diagnosis not present

## 2016-02-28 DIAGNOSIS — G4733 Obstructive sleep apnea (adult) (pediatric): Secondary | ICD-10-CM

## 2016-02-28 MED ORDER — LISINOPRIL 10 MG PO TABS: ORAL_TABLET | ORAL | Status: DC

## 2016-02-28 MED FILL — LISINOPRIL 10 MG TABLET: 10 | 90 days supply | Qty: 90 | Fill #0

## 2016-02-28 NOTE — Progress Notes (Signed)
CC: [name] is a 60 y.o. female is here for discuss cpap and Medication Problem   Subjective: HPI:  Follow-up OSA: Patient is compliant to consistent home use of the PAP device for four or more hours per 24 hour period on at least 70% of nights.  Additionally, I have performed a face to face clinical re-evaluation which confirmed that the patient has been benefiting from the PAP device since beginning this therapy, she confirms it is making a great deal of difference with no longer feeling fatigued during the day and has resolved her nonrestorative sleep.   Follow essential hypertension: She found that taking a full dose of 20 mg of lisinopril caused hypotension and lightheadedness. She's been taking half a tab daily which has totally resolved the symptoms. She denies chest pain shortness of breath orthopnea nor peripheral edema    Review Of Systems Outlined In HPI  Past Medical History  Diagnosis Date  . Thyroid disease   . Hyperlipidemia   . Allergy   . GERD (gastroesophageal reflux disease)   . Mitral valve prolapse   . Aortic insufficiency     Mild  . Mitral regurgitation   . Need for SBE (subacute bacterial endocarditis) prophylaxis   . Migraine headache   . Pilonidal cyst without mention of abscess   . Nausea alone   . PONV (postoperative nausea and vomiting)   . Hypothyroidism   . Dysrhythmia     pvc's at times  . Anxiety   . Pneumonia Aug 14 2012  . Transplanted kidney removed     donated kidney    Past Surgical History  Procedure Laterality Date  . Tonsillectomy    . Nephrectomy    . Abdominal hysterectomy    . Knee surgery    . Carpal tunnel release Right   . Total knee arthroplasty Left 09/14/2013    Procedure: LEFT TOTAL KNEE ARTHROPLASTY;  Surgeon: [name], MD;  Location: [facility];  Service: Orthopedics;  Laterality: Left;   Family History  Problem Relation Age of Onset  . Diabetes Mother   . Hypertension Mother   . Heart disease Father    . Hypertension Father   . Thyroid disease Father   . Cancer Maternal Aunt   . Cancer Maternal Aunt   . Colon cancer Neg Hx   . Esophageal cancer Neg Hx   . Rectal cancer Neg Hx   . Stomach cancer Neg Hx     Social History   Social History  . Marital Status: Married    Spouse Name: [name]  . Number of Children: 4  . Years of Education: 14+   Occupational History  .  [employer]   Social History Main Topics  . Smoking status: Former Smoker    Quit date: 11/22/1983  . Smokeless tobacco: Never Used  . Alcohol Use: Yes     Comment: occasional  . Drug Use: No  . Sexual Activity:    Partners: Male   Other Topics Concern  . Not on file   Social History Narrative   Marital Status:  Married Marine scientist)   Children: 4   Pets:  Dogs (2)   Living Situation: Lives with spouse, son and his family.     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 General: Alert and Oriented, No Acute Distress HEENT: Pupils equal, round, reactive to light. Conjunctivae clear.  External ears unremarkable.  Moist mucous membranes. Lungs: Clear and comfortable work of breathing, speaking in full sentences without accessory muscle use. Cardiac: Regular rate and rhythm.  Neuro: CN II-XII grossly intact, gait normal. Extremities: No peripheral edema.  Strong peripheral pulses.  Mental Status: No depression, anxiety, nor agitation. Logical though process. Skin: Warm and dry.  Assessment & Plan: Chaille was seen today for discuss cpap and medication problem.  Diagnoses and all orders for this visit:  OSA (obstructive sleep apnea)  Essential hypertension, benign  Other orders -     lisinopril  (PRINIVIL,ZESTRIL) 10 MG tablet; TAKE 1 TABLET (10 MG TOTAL) BY MOUTH DAILY.    OSA: Controlled with nightly CPAP. Essential hypertension: Controlled on 10 mg of lisinopril daily  Discussed with this patient that I will be resigning from my position here with [facility] [facility] in September in order to stay with my family who will be moving to [facility]. I let him know about the providers that are still accepting patients and I feel that this individual will be under great care if he/she stays here with [facility].  Return in about 3 months (around 05/30/2016) for [facility] Visit.

## 2016-03-18 DIAGNOSIS — G4733 Obstructive sleep apnea (adult) (pediatric): Secondary | ICD-10-CM | POA: Diagnosis not present

## 2016-03-20 MED FILL — CITALOPRAM HBR 20 MG TABLET: 20 | 90 days supply | Qty: 90 | Fill #1

## 2016-04-14 MED FILL — LEVOTHYROXINE 125 MCG TAB: 125 | 90 days supply | Qty: 90 | Fill #1

## 2016-04-14 MED FILL — ROSUVASTATIN CALCIUM 10 MG: 10 | 90 days supply | Qty: 90 | Fill #2

## 2016-04-18 DIAGNOSIS — G4733 Obstructive sleep apnea (adult) (pediatric): Secondary | ICD-10-CM | POA: Diagnosis not present

## 2016-04-28 MED FILL — ESOMEPRAZOLE MAG DR 40 MG C: 40 | 90 days supply | Qty: 90 | Fill #1

## 2016-05-22 MED FILL — LIOTHYRONINE SOD 5 MCG TAB: 5 | 90 days supply | Qty: 90 | Fill #1

## 2016-05-22 MED FILL — LISINOPRIL 10 MG TABLET: 10 | 90 days supply | Qty: 90 | Fill #1

## 2016-06-22 MED FILL — CITALOPRAM HBR 20 MG TABLET: 20 | 90 days supply | Qty: 90 | Fill #2

## 2016-07-14 MED FILL — LEVOTHYROXINE 125 MCG TAB: 125 | 90 days supply | Qty: 90 | Fill #2

## 2016-07-17 DIAGNOSIS — J029 Acute pharyngitis, unspecified: Secondary | ICD-10-CM | POA: Diagnosis not present

## 2016-07-20 DIAGNOSIS — R5383 Other fatigue: Secondary | ICD-10-CM | POA: Diagnosis not present

## 2016-07-20 DIAGNOSIS — Z1159 Encounter for screening for other viral diseases: Secondary | ICD-10-CM | POA: Diagnosis not present

## 2016-07-20 DIAGNOSIS — E039 Hypothyroidism, unspecified: Secondary | ICD-10-CM | POA: Diagnosis not present

## 2016-07-20 DIAGNOSIS — E785 Hyperlipidemia, unspecified: Secondary | ICD-10-CM | POA: Diagnosis not present

## 2016-07-23 MED FILL — ROSUVASTATIN CALCIUM 10 MG: 10 | 90 days supply | Qty: 90 | Fill #3

## 2016-07-27 MED FILL — ESOMEPRAZOLE MAG DR 40 MG C: 40 | 90 days supply | Qty: 90 | Fill #2

## 2016-08-14 MED FILL — ERYTHROMYCIN EYE OINTMENT: 5 | 10 days supply | Qty: 4 | Fill #0

## 2016-08-24 MED FILL — LIOTHYRONINE SOD 5 MCG TAB: 5 | 90 days supply | Qty: 90 | Fill #2

## 2016-09-10 DIAGNOSIS — K219 Gastro-esophageal reflux disease without esophagitis: Secondary | ICD-10-CM | POA: Diagnosis not present

## 2016-09-10 DIAGNOSIS — E039 Hypothyroidism, unspecified: Secondary | ICD-10-CM | POA: Diagnosis not present

## 2016-09-10 DIAGNOSIS — F419 Anxiety disorder, unspecified: Secondary | ICD-10-CM | POA: Diagnosis not present

## 2016-09-10 DIAGNOSIS — R112 Nausea with vomiting, unspecified: Secondary | ICD-10-CM | POA: Diagnosis not present

## 2016-09-10 DIAGNOSIS — E781 Pure hyperglyceridemia: Secondary | ICD-10-CM | POA: Diagnosis not present

## 2016-09-10 DIAGNOSIS — I10 Essential (primary) hypertension: Secondary | ICD-10-CM | POA: Diagnosis not present

## 2016-09-10 DIAGNOSIS — E785 Hyperlipidemia, unspecified: Secondary | ICD-10-CM | POA: Diagnosis not present

## 2016-09-10 DIAGNOSIS — G43909 Migraine, unspecified, not intractable, without status migrainosus: Secondary | ICD-10-CM | POA: Diagnosis not present

## 2016-09-10 DIAGNOSIS — R062 Wheezing: Secondary | ICD-10-CM | POA: Diagnosis not present

## 2016-09-15 DIAGNOSIS — H524 Presbyopia: Secondary | ICD-10-CM | POA: Diagnosis not present

## 2016-09-15 DIAGNOSIS — H52223 Regular astigmatism, bilateral: Secondary | ICD-10-CM | POA: Diagnosis not present

## 2016-10-07 MED FILL — ARMODAFINIL 150 MG TABLET: 150 | 90 days supply | Qty: 90 | Fill #0

## 2017-01-22 ENCOUNTER — Ambulatory Visit (INDEPENDENT_AMBULATORY_CARE_PROVIDER_SITE_OTHER): Payer: 59 | Admitting: Physician Assistant

## 2017-01-22 ENCOUNTER — Encounter: Payer: Self-pay | Admitting: Physician Assistant

## 2017-01-22 VITALS — BP 127/77 | HR 94 | Ht 65.0 in | Wt 215.0 lb

## 2017-01-22 DIAGNOSIS — K219 Gastro-esophageal reflux disease without esophagitis: Secondary | ICD-10-CM

## 2017-01-22 DIAGNOSIS — R400 Somnolence: Secondary | ICD-10-CM | POA: Diagnosis not present

## 2017-01-22 DIAGNOSIS — I341 Nonrheumatic mitral (valve) prolapse: Secondary | ICD-10-CM

## 2017-01-22 DIAGNOSIS — E7800 Pure hypercholesterolemia, unspecified: Secondary | ICD-10-CM

## 2017-01-22 DIAGNOSIS — E669 Obesity, unspecified: Secondary | ICD-10-CM | POA: Diagnosis not present

## 2017-01-22 DIAGNOSIS — G4733 Obstructive sleep apnea (adult) (pediatric): Secondary | ICD-10-CM | POA: Diagnosis not present

## 2017-01-22 DIAGNOSIS — G43009 Migraine without aura, not intractable, without status migrainosus: Secondary | ICD-10-CM | POA: Diagnosis not present

## 2017-01-22 DIAGNOSIS — R51 Headache: Secondary | ICD-10-CM

## 2017-01-22 DIAGNOSIS — E039 Hypothyroidism, unspecified: Secondary | ICD-10-CM | POA: Diagnosis not present

## 2017-01-22 DIAGNOSIS — I10 Essential (primary) hypertension: Secondary | ICD-10-CM

## 2017-01-22 DIAGNOSIS — R519 Headache, unspecified: Secondary | ICD-10-CM

## 2017-01-22 LAB — CBC WITH DIFFERENTIAL/PLATELET
Basophils Absolute: 48 cells/uL (ref 0–200)
Basophils Relative: 1 %
Eosinophils Absolute: 240 cells/uL (ref 15–500)
Eosinophils Relative: 5 %
HCT: 41.5 % (ref 35.0–45.0)
Hemoglobin: 13.8 g/dL (ref 11.7–15.5)
LYMPHS PCT: 32 %
Lymphs Abs: 1536 cells/uL (ref 850–3900)
MCH: 27.5 pg (ref 27.0–33.0)
MCHC: 33.3 g/dL (ref 32.0–36.0)
MCV: 82.8 fL (ref 80.0–100.0)
MONOS PCT: 8 %
MPV: 9.6 fL (ref 7.5–12.5)
Monocytes Absolute: 384 cells/uL (ref 200–950)
NEUTROS PCT: 54 %
Neutro Abs: 2592 cells/uL (ref 1500–7800)
PLATELETS: 340 10*3/uL (ref 140–400)
RBC: 5.01 MIL/uL (ref 3.80–5.10)
RDW: 15.4 % — AB (ref 11.0–15.0)
WBC: 4.8 10*3/uL (ref 3.8–10.8)

## 2017-01-22 LAB — SEDIMENTATION RATE: SED RATE: 1 mm/h (ref 0–30)

## 2017-01-22 MED ORDER — ARMODAFINIL 150 MG PO TABS: 150.0000 mg | ORAL_TABLET | Freq: Every day | ORAL | 5 refills | Status: DC

## 2017-01-22 MED ORDER — LIRAGLUTIDE -WEIGHT MANAGEMENT 18 MG/3ML ~~LOC~~ SOPN: 0.6000 mg | PEN_INJECTOR | Freq: Every day | SUBCUTANEOUS | 1 refills | Status: DC

## 2017-01-22 MED ORDER — TOPIRAMATE ER 50 MG PO CAP24: 1.0000 | ORAL_CAPSULE | Freq: Every day | ORAL | 0 refills | Status: DC

## 2017-01-22 NOTE — Progress Notes (Signed)
Call pt: ESR 1 not concerning for temporal arteritis. Good news. Hopefully trokendi will help with this. WBC looks good.

## 2017-01-22 NOTE — Progress Notes (Signed)
Subjective:    Patient ID: [name], female    DOB: 1955-11-02, 61 y.o.   MRN: [id number]  HPI  Pt is a 61 yo female who presents to the clinic for medication refills.   Pt has OSA and using CPAP daily. She has no problems sleeping but continues to wake up tired and has excessive sleepiness throughout the day. She uses nuvigil and helps. She needs refill.   Migraines- she continues to have headaches a few days a week. She does not take a preventative or prescription rescue. OtC NSAID or tylenol with rest helps. She has had a different headache for last month that seems to reside in right temporal area and noticed today that she was tender in this area. Denies any vision changes but does feel some pressure behind right eye.   She is very frustrated with her weight. She lost weight with phentermine, hCG shots and diet changes but gained it all back. She walks a lot at work. She does not do any exercise. She admits her diet is not healthy. She drinks a lot of sodas.   GERD-controlled with medication.   . Active Ambulatory Problems    Diagnosis Date Noted  . UTI (urinary tract infection) 07/14/2012  . Other and unspecified hyperlipidemia 07/14/2012  . MVP (mitral valve prolapse) 07/14/2012  . GERD (gastroesophageal reflux disease) 07/14/2012  . Allergic rhinitis 07/14/2012  . Mitral regurgitation 07/14/2012  . S/P hysterectomy 07/14/2012  . Essential hypertension, benign 04/29/2013  . Wheezing 04/29/2013  . Insomnia 04/29/2013  . Unspecified vitamin D deficiency 04/29/2013  . Migraine headache 04/29/2013  . Anxiety state 04/29/2013  . Unspecified hypothyroidism 04/29/2013  . Obesity (BMI 30-39.9) 07/17/2013  . Cough 08/15/2013  . Arthritis of left knee 09/14/2013  . Status post total knee replacement 09/14/2013  . Abdominal pain, chronic, right upper quadrant 11/16/2013  . Candidiasis of female genitalia 11/16/2013  . Elevated liver enzymes 11/16/2013  . Folliculitis  05/16/1218  . Nausea alone 11/18/2013  . Abnormal transaminases 11/18/2013  . Hyperlipidemia 11/22/2014  . Single kidney 11/22/2014  . Hypothyroidism 11/22/2014  . OSA (obstructive sleep apnea) 12/23/2015  . Daytime sleepiness 01/24/2017   Resolved Ambulatory Problems    Diagnosis Date Noted  . Hypothyroidism 07/14/2012   Past Medical History:  Diagnosis Date  . Allergy   . Anxiety   . Aortic insufficiency   . Dysrhythmia   . GERD (gastroesophageal reflux disease)   . Hyperlipidemia   . Hypothyroidism   . Migraine headache   . Mitral regurgitation   . Mitral valve prolapse   . Nausea alone   . Need for SBE (subacute bacterial endocarditis) prophylaxis   . Pilonidal cyst without mention of abscess   . Pneumonia Aug 14 2012  . PONV (postoperative nausea and vomiting)   . Thyroid disease   . Transplanted kidney removed        Review of Systems  All other systems reviewed and are negative.      Objective:   Physical Exam  Constitutional: She is oriented to person, place, and time. She appears well-developed and well-nourished.  HENT:  Head: Normocephalic and atraumatic.  Right Ear: External ear normal.  Left Ear: External ear normal.  Nose: Nose normal.  Mouth/Throat: Oropharynx is clear and moist.  Tenderness of right temple.    Eyes: Conjunctivae and EOM are normal. Pupils are equal, round, and reactive to light. Right eye exhibits no discharge. Left eye exhibits no discharge.  Neck:  Normal range of motion. Neck supple. No thyromegaly present.  Cardiovascular: Normal rate, regular rhythm and normal heart sounds.   Pulmonary/Chest: Effort normal and breath sounds normal.  Lymphadenopathy:    She has no cervical adenopathy.  Neurological: She is alert and oriented to person, place, and time.  Psychiatric: She has a normal mood and affect. Her behavior is normal.          Assessment & Plan:  Marland KitchenMarland KitchenDiagnoses and all orders for this visit:  Right sided  temporal headache -     CBC with Differential/Platelet -     Sed Rate (ESR) -     C-reactive protein  Migraine without aura and without status migrainosus, not intractable -     Topiramate ER 50 MG CP24; Take 1 tablet by mouth daily.  Essential hypertension, benign  MVP (mitral valve prolapse)  OSA (obstructive sleep apnea)  Hypothyroidism, unspecified type  Pure hypercholesterolemia  Daytime sleepiness -     Armodafinil 150 MG tablet; Take 1 tablet (150 mg total) by mouth daily.  Gastroesophageal reflux disease, esophagitis presence not specified  Obesity (BMI 30-39.9) -     Liraglutide -Weight Management (SAXENDA) 18 MG/3ML SOPN; Inject 0.6 mg into the skin daily. For one week then increase by .60m weekly until reaches 360mdaily.  Please include ultra fine needles 47m1m Will get work up for temporal arteritis due to tenderness over right temple and headache. HO given due to emergent nature.  Start Trokendi for migraine. Discussed side effects and gave coupon card. Follow up in 2 months.  Start saxenda for weight loss. Discussed titration up to 3mg48miscussed side effects. Encouraged 1500 calorie diet and 150 minutes of exercise a week.

## 2017-01-22 NOTE — Patient Instructions (Signed)
Temporal Arteritis  Temporal arteritis, also called giant cell arteritis, is a condition that causes arteries to become swollen (inflamed). It usually affects arteries in your head and face, but arteries in any part of the body can become inflamed. Temporal arteritis can cause serious problems, such as bone loss, diabetes, and blindness. What are the causes? The cause is unknown. What increases the risk?  Being older than 50.  Being a woman.  Being Caucasian.  Being of Danish, Swedish, Finnish, Norwegian, or Icelandic ancestry.  Having polymyalgia rheumatica (PMR). What are the signs or symptoms? Some people with temporal arteritis have just one symptom, while other have several symptoms. Most signs and symptoms are related to the head and face. Signs and symptoms may include:  Hard or swollen temples (common). Your temples are the flattened area on either side of your forehead. If your temples are swollen, it may hurt to touch them.  Pain when combing your hair or when laying your head down.  Pain in the jaw when chewing.  Pain in the throat or tongue.  Problems with your vision, such as sudden loss of vision in one eye, or seeing double.  Fever.  Fatigue.  A dry cough.  Pain in the hips and shoulders.  Pain in the arms during exercise.  Depression.  Weight loss.  How is this diagnosed? Your health care provider will ask about your symptoms and do a physical exam. He or she may also perform an eye exam and tests, such as:  A complete blood count.  An erythrocyte sedimentation rate test, also called the sed rate test.  A C-reactive protein (CRP) test.  A tissue sample (biopsy) test.  How is this treated? Temporal arteritis is treated with a type of medicine called a corticosteroid. Vision problems may be treated with additional medicines. You will need to see your health care provider while you are being treated. During follow-up visits, your health care  provider will check for problems by:  Performing blood tests and bone density tests.  Checking your blood pressure and blood sugar.  Follow these instructions at home:  Take medicines only as directed by your health care provider.  Take any vitamins or supplements that your health care provider suggests. These may include vitamin D and calcium, which help keep your bones from becoming weak.  Exercise. Talk with your health care provider about what exercises are okay for you to do. Usually exercises that increase your heart rate (aerobic exercise), such as walking, are recommended. Aerobic exercise helps control your blood pressure and prevent bone loss.  Follow a healthy diet. Include healthy sources of protein, fruits, vegetables, and whole grains in your diet. Following a healthy diet helps prevent bone damage and diabetes. Contact a health care provider if:  Your symptoms get worse.  Your fever, fatigue, headache, weight loss, or pain in your jaw gets worse.  You develop signs of infection, such as fever, swelling, redness, warmth, and tenderness. Get help right away if:  Your vision gets worse.  Your pain does not go away, even after you take pain medicine.  You have chest pain.  You have trouble breathing.  One side of your face or body suddenly becomes weak or numb. This information is not intended to replace advice given to you by your health care provider. Make sure you discuss any questions you have with your health care provider. Document Released: 05/24/2009 Document Revised: 03/26/2016 Document Reviewed: 09/20/2013 Elsevier Interactive Patient Education  2018 Elsevier   Inc.  

## 2017-01-23 LAB — C-REACTIVE PROTEIN: CRP: 1.2 mg/L (ref <8.0)

## 2017-01-24 DIAGNOSIS — R400 Somnolence: Secondary | ICD-10-CM | POA: Insufficient documentation

## 2017-01-26 ENCOUNTER — Telehealth: Payer: Self-pay | Admitting: *Deleted

## 2017-01-26 MED FILL — ARMODAFINIL 150 MG TABLET: 150 | 90 days supply | Qty: 90 | Fill #1

## 2017-01-26 NOTE — Telephone Encounter (Signed)
Pre Authorization sent to cover my meds. (Key: [id number]

## 2017-01-27 ENCOUNTER — Encounter: Payer: Self-pay | Admitting: Physician Assistant

## 2017-01-27 ENCOUNTER — Ambulatory Visit (INDEPENDENT_AMBULATORY_CARE_PROVIDER_SITE_OTHER): Payer: 59 | Admitting: Physician Assistant

## 2017-01-27 VITALS — BP 120/71 | HR 88 | Ht 65.0 in

## 2017-01-27 DIAGNOSIS — M25551 Pain in right hip: Secondary | ICD-10-CM | POA: Diagnosis not present

## 2017-01-27 MED ORDER — MELOXICAM 15 MG PO TABS: 15.0000 mg | ORAL_TABLET | Freq: Every day | ORAL | 1 refills | Status: DC

## 2017-01-27 MED FILL — MELOXICAM 15 MG TABLET: 15 | 30 days supply | Qty: 30 | Fill #0

## 2017-01-27 NOTE — Patient Instructions (Signed)
Hip Bursitis Hip bursitis is swelling of a fluid-filled sac (bursa) in your hip. This swelling (inflammation) can be painful. This condition may come and go over time. Follow these instructions at home: Medicines  Take over-the-counter and prescription medicines only as told by your doctor.  Do not drive or use heavy machinery while taking prescription pain medicine, or as told by your doctor.  If you were prescribed an antibiotic medicine, take it as told by your doctor. Do not stop taking the antibiotic even if you start to feel better. Activity  Return to your normal activities as told by your doctor. Ask your doctor what activities are safe for you.  Rest and protect your hip until you feel better. General instructions  Wear wraps that put pressure on your hip (compression wraps) only as told by your doctor.  Raise (elevate) your hip above the level of your heart as much as you can. To do this, try putting a pillow under your hips while you lie down. Stop if this causes pain.  Do not use your hip to support your body weight until your doctor says that you can.  Use crutches as told by your doctor.  Gently rub and stretch your injured area as often as is comfortable.  Keep all follow-up visits as told by your doctor. This is important. How is this prevented?  Exercise regularly, as told by your doctor.  Warm up and stretch before being active.  Cool down and stretch after being active.  Avoid activities that bother your hip or cause pain.  Avoid sitting down for long periods at a time. Contact a doctor if:  You have a fever.  You get new symptoms.  You have trouble walking.  You have trouble doing everyday activities.  You have pain that gets worse.  You have pain that does not get better with medicine.  You get red skin on your hip area.  You get a feeling of warmth in your hip area. Get help right away if:  You cannot move your hip.  You have very bad  pain. This information is not intended to replace advice given to you by your health care provider. Make sure you discuss any questions you have with your health care provider. Document Released: 08/29/2010 Document Revised: 01/02/2016 Document Reviewed: 02/26/2015 Elsevier Interactive Patient Education  2018 Elsevier Inc.  

## 2017-01-27 NOTE — Progress Notes (Signed)
Subjective:    Patient ID: [name], female    DOB: August 13, 1955, 61 y.o.   MRN: [id number]  HPI  Pt is a 61 yo female who presents to the clinic with sudden right hip pain that she woke up with yesterday. She denies any injury or change in activity level. She has taken aleve with little relief. She has taken a muscle relaxer and tramadol with minimal relief. He hurts to bear weight, walk, and move right leg. No pain at rest. Never had anything like this before.   .. Active Ambulatory Problems    Diagnosis Date Noted  . UTI (urinary tract infection) 07/14/2012  . Other and unspecified hyperlipidemia 07/14/2012  . MVP (mitral valve prolapse) 07/14/2012  . GERD (gastroesophageal reflux disease) 07/14/2012  . Allergic rhinitis 07/14/2012  . Mitral regurgitation 07/14/2012  . S/P hysterectomy 07/14/2012  . Essential hypertension, benign 04/29/2013  . Wheezing 04/29/2013  . Insomnia 04/29/2013  . Unspecified vitamin D deficiency 04/29/2013  . Migraine headache 04/29/2013  . Anxiety state 04/29/2013  . Unspecified hypothyroidism 04/29/2013  . Obesity (BMI 30-39.9) 07/17/2013  . Cough 08/15/2013  . Arthritis of left knee 09/14/2013  . Status post total knee replacement 09/14/2013  . Abdominal pain, chronic, right upper quadrant 11/16/2013  . Candidiasis of female genitalia 11/16/2013  . Elevated liver enzymes 11/16/2013  . Folliculitis 61/95/0932  . Nausea alone 11/18/2013  . Abnormal transaminases 11/18/2013  . Hyperlipidemia 11/22/2014  . Single kidney 11/22/2014  . Hypothyroidism 11/22/2014  . OSA (obstructive sleep apnea) 12/23/2015  . Daytime sleepiness 01/24/2017   Resolved Ambulatory Problems    Diagnosis Date Noted  . Hypothyroidism 07/14/2012   Past Medical History:  Diagnosis Date  . Allergy   . Anxiety   . Aortic insufficiency   . Dysrhythmia   . GERD (gastroesophageal reflux disease)   . Hyperlipidemia   . Hypothyroidism   . Migraine headache   .  Mitral regurgitation   . Mitral valve prolapse   . Nausea alone   . Need for SBE (subacute bacterial endocarditis) prophylaxis   . Pilonidal cyst without mention of abscess   . Pneumonia Aug 14 2012  . PONV (postoperative nausea and vomiting)   . Thyroid disease   . Transplanted kidney removed       Review of Systems  All other systems reviewed and are negative.      Objective:   Physical Exam  Constitutional: She appears well-developed and well-nourished.  HENT:  Head: Normocephalic and atraumatic.  Musculoskeletal:  Right hip: Pain with external ROM.  Tenderness to palpation over lateral hip at the greater trochanteric bursa.  Strength 5/5.  Negative straight leg test.             Assessment & Plan:  Marland KitchenMarland KitchenDiagnoses and all orders for this visit:  Right hip pain -     meloxicam (MOBIC) 15 MG tablet; Take 1 tablet (15 mg total) by mouth daily.   Suspect right hip bursitis.  Discussed xray. Pt declined if continues to have current level of pain needs to call back to have ordered.  Given exercises to start.  Ice are as needed.  Injection given today.  Start [employer].  Pt has appt with orthopedic next week for follow up.   Injection Procedure Note KASARAH SITTS [id number] 1956/06/08  Procedure: Injection Indications: pain  Procedure Details Consent: Risks of procedure as well as the alternatives and risks of each were explained to the (patient/caregiver).  Consent  for procedure obtained. Time Out: Verified patient identification, verified procedure, site/side was marked, verified correct patient position, special equipment/implants available, medications/allergies/relevent history reviewed, required imaging and test results available.  Performed   Local Anesthesia Used:Ethyl Chloride Spray 33mL of lidocaine 1 percent no epi and 61mL of depo medrol 40mg  injected into right greater trochanteric bursa. A sterile dressing was applied.  Patient did tolerate  procedure well.  [name] 01/27/2017,

## 2017-02-01 NOTE — Telephone Encounter (Signed)
Received fax for more information I answered all questions and faxed back. - CF

## 2017-02-03 ENCOUNTER — Ambulatory Visit (INDEPENDENT_AMBULATORY_CARE_PROVIDER_SITE_OTHER): Payer: 59 | Admitting: Orthopaedic Surgery

## 2017-02-03 DIAGNOSIS — G5602 Carpal tunnel syndrome, left upper limb: Secondary | ICD-10-CM | POA: Diagnosis not present

## 2017-02-03 NOTE — Progress Notes (Signed)
Office Visit Note   Patient: [name]           Date of Birth: [birth year]           MRN: [id number] Visit Date: 02/03/2017              Requested by: Donella Stade, PA-C [address] [place], [place] [address] PCP: Donella Stade, PA-C   Assessment & Plan: Visit Diagnoses:  1. Carpal tunnel syndrome, left upper limb     Plan: At this point she has failed 2 injections in her left hand. Her pain is daily but her numbness is quite severe. I do feel that a carpal tunnel releases warranted at this time based on her clinical exam findings. The injections used to help but at this point her numbness is gotten quite severe. She has had a carpal tunnel release on the right side remotely. She understands fully the risks and benefits of this surgery we talked in detail about the anatomy of the transverse carpal ligament and what surgery involves. All questions were encouraged and answered. We'll see her back in 2 weeks postoperative for suture removal.  Follow-Up Instructions: Return for 2 weeks post-op.   Orders:  No orders of the defined types were placed in this encounter.  No orders of the defined types were placed in this encounter.     Procedures: No procedures performed   Clinical Data: No additional findings.   Subjective: No chief complaint on file. [name] is well-known to me. She's been dealing with carpal tunnel syndrome is worsening in her left hand for some time now. We've injected her twice and now her numbness is affecting her daily work and is getting worse.  HPI  Review of Systems She currently denies any headache, chest pain, short of breath, fever, chills, nausea, vomiting  Objective: Vital Signs: There were no vitals taken for this visit.  Physical Exam She is alert and 3 in no acute distress Ortho Exam Examination of her left hand shows weakness strength. She has positive Phalen's and Tinel sign but no muscle atrophy yet. Her  hand is well-perfused. Specialty Comments:  No specialty comments available.  Imaging: No results found.   PMFS History: Patient Active Problem List   Diagnosis Date Noted  . Daytime sleepiness 01/24/2017  . OSA (obstructive sleep apnea) 12/23/2015  . Hyperlipidemia 11/22/2014  . Single kidney 11/22/2014  . Hypothyroidism 11/22/2014  . Nausea alone 11/18/2013  . Abnormal transaminases 11/18/2013  . Abdominal pain, chronic, right upper quadrant 11/16/2013  . Candidiasis of female genitalia 11/16/2013  . Elevated liver enzymes 11/16/2013  . Folliculitis 16/38/4665  . Arthritis of left knee 09/14/2013  . Status post total knee replacement 09/14/2013  . Cough 08/15/2013  . Obesity (BMI 30-39.9) 07/17/2013  . Essential hypertension, benign 04/29/2013  . Wheezing 04/29/2013  . Insomnia 04/29/2013  . Unspecified vitamin D deficiency 04/29/2013  . Migraine headache 04/29/2013  . Anxiety state 04/29/2013  . Unspecified hypothyroidism 04/29/2013  . UTI (urinary tract infection) 07/14/2012  . Other and unspecified hyperlipidemia 07/14/2012  . MVP (mitral valve prolapse) 07/14/2012  . GERD (gastroesophageal reflux disease) 07/14/2012  . Allergic rhinitis 07/14/2012  . Mitral regurgitation 07/14/2012  . S/P hysterectomy 07/14/2012   Past Medical History:  Diagnosis Date  . Allergy   . Anxiety   . Aortic insufficiency    Mild  . Dysrhythmia    pvc's at times  . GERD (gastroesophageal reflux disease)   .  Hyperlipidemia   . Hypothyroidism   . Migraine headache   . Mitral regurgitation   . Mitral valve prolapse   . Nausea alone   . Need for SBE (subacute bacterial endocarditis) prophylaxis   . Pilonidal cyst without mention of abscess   . Pneumonia Aug 14 2012  . PONV (postoperative nausea and vomiting)   . Thyroid disease   . Transplanted kidney removed    donated kidney    Family History  Problem Relation Age of Onset  . Diabetes Mother   . Hypertension Mother   .  Heart disease Father   . Hypertension Father   . Thyroid disease Father   . Cancer Maternal Aunt   . Cancer Maternal Aunt   . Colon cancer Neg Hx   . Esophageal cancer Neg Hx   . Rectal cancer Neg Hx   . Stomach cancer Neg Hx     Past Surgical History:  Procedure Laterality Date  . ABDOMINAL HYSTERECTOMY    . CARPAL TUNNEL RELEASE Right   . KNEE SURGERY    . NEPHRECTOMY    . TONSILLECTOMY    . TOTAL KNEE ARTHROPLASTY Left 09/14/2013   Procedure: LEFT TOTAL KNEE ARTHROPLASTY;  Surgeon: [name], MD;  Location: [facility];  Service: Orthopedics;  Laterality: Left;   Social History   Occupational History  .  [employer]   Social History Main Topics  . Smoking status: Former Smoker    Quit date: 11/22/1983  . Smokeless tobacco: Never Used  . Alcohol use Yes     Comment: occasional  . Drug use: No  . Sexual activity: Yes    Partners: Male

## 2017-02-05 NOTE — Telephone Encounter (Signed)
Received fax from [facility] and they denied coverage on Topiramate ER due to patient did not meet step therapy rule for the requested drug. - CF  PA Reference # [id number]

## 2017-02-08 ENCOUNTER — Telehealth: Payer: Self-pay

## 2017-02-08 NOTE — Telephone Encounter (Signed)
PA submitted through cover my meds.  Awaiting response. -EH/RMA

## 2017-02-12 DIAGNOSIS — G4733 Obstructive sleep apnea (adult) (pediatric): Secondary | ICD-10-CM | POA: Diagnosis not present

## 2017-02-16 ENCOUNTER — Other Ambulatory Visit (INDEPENDENT_AMBULATORY_CARE_PROVIDER_SITE_OTHER): Payer: Self-pay | Admitting: Physician Assistant

## 2017-02-16 NOTE — Telephone Encounter (Signed)
Response to questions sent.

## 2017-02-18 ENCOUNTER — Telehealth: Payer: Self-pay

## 2017-02-18 DIAGNOSIS — G441 Vascular headache, not elsewhere classified: Secondary | ICD-10-CM

## 2017-02-18 DIAGNOSIS — I10 Essential (primary) hypertension: Secondary | ICD-10-CM

## 2017-02-18 NOTE — Telephone Encounter (Signed)
Appeal tracking # [id number]

## 2017-02-18 NOTE — Telephone Encounter (Signed)
Received a fax fro [facility] stating pt wants to use [facility] pharmacy for approval of her Topiramate. Attempted to call [facility] and message stated that any medication written after 08/09/2016 is to be directed to [employer] because they no longer cover [employer]. Called [employer] for an appeal to be started on pt's Topiramate ER and an urgent appeal will be submitted. Called pt to inform her of the status and her line was breaking up. Pt did state that she will call back.

## 2017-02-19 NOTE — Telephone Encounter (Signed)
Medication approved PA reference # [id number]

## 2017-02-23 ENCOUNTER — Encounter (HOSPITAL_COMMUNITY): Payer: Self-pay | Admitting: *Deleted

## 2017-02-23 MED FILL — SAXENDA 18 MG/3 ML PEN: 18 | 30 days supply | Qty: 15 | Fill #0

## 2017-02-24 ENCOUNTER — Encounter (HOSPITAL_COMMUNITY): Payer: Self-pay | Admitting: *Deleted

## 2017-02-24 NOTE — Progress Notes (Signed)
Anesthesia Chart Review: SAME DAY WORK-UP.  Patient is a 61 year old female scheduled for left open carpal tunnel release on 02/25/17 by Dr. Jean Rosenthal. Procedure is posted for MAC anesthesia.   History includes former smoker (quit '85), nephrectomy (donated to daughter), HLD, GERD, migraines, occasional PVCs, hypothyroidism, anxiety, tonsillectomy, hysterectomy, post-operative N/V, left TKA 09/14/13. History listed also shows MVP, mild AI, MR, and need for SBE prophylaxis. (Last echo 2016 and showed no significant MR, mild AI. She saw Dr. Marlou Porch then who documented, "No need to repeat echocardiogram on a yearly basis, could consider repeating in approximately 5 years. Also, no dental antibodies needed. 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  PCP is listed as Iran Planas, PA-C. She is not followed by cardiology on a regular basis, but saw Dr. Candee Furbish for evaluation of MR on 6/0/16 with echo repeated (see below). PRN follow-up recommended with consideration of follow-up echo in 5 years.   Meds include albuterol, Armodafinal, Tums, Zyrtec, Celexa, levothyroxine, Saxenda (not started yet), lisinopril-HCTZ, Aciphex, Crestor, Topiramate ER.  Echo 01/18/15: Study Conclusions - Left ventricle: The cavity size was normal. Wall thickness was   increased in a pattern of mild LVH. Systolic function was normal.   The estimated ejection fraction was in the range of 60% to 65%.   Wall motion was normal; there were no regional wall motion   abnormalities. The study is not technically sufficient to allow   evaluation of LV diastolic function. - Aortic valve: Sclerosis without stenosis. There was mild   regurgitation. - Mitral valve: Poorly visualized. There was no significant   regurgitation. - Left atrium: The atrium was mildly dilated at 36 ml/m2. Impressions: - LVEF 60-65%, mild LVH, normal wall motion, cannot estimate   diastolic  function, mild AI, poorly visualized mitral valve,   mildly dialted LA.  She is a same day work-up, so additional testing as needed to be done on arrival. Based on currently available information, I anticipate that she can proceed as planned. Anesthesiologist to evaluation on arrival.  George Hugh [facility] Short Stay Center/Anesthesiology Phone [phone] 02/24/2017 10:41 AM

## 2017-02-24 NOTE — Anesthesia Preprocedure Evaluation (Addendum)
Anesthesia Evaluation  Patient identified by MRN, date of birth, ID band Patient awake    Reviewed: Allergy & Precautions, H&P , NPO status , Patient's Chart, lab work & pertinent test results  History of Anesthesia Complications (+) PONV  Airway Mallampati: III  TM Distance: >3 FB Neck ROM: Full    Dental no notable dental hx. (+) Partial Upper, Dental Advisory Given   Pulmonary sleep apnea and Continuous Positive Airway Pressure Ventilation , former smoker,    Pulmonary exam normal breath sounds clear to auscultation       Cardiovascular Exercise Tolerance: Good hypertension, Pt. on medications + dysrhythmias  Rhythm:Regular Rate:Normal     Neuro/Psych  Headaches, Anxiety negative psych ROS   GI/Hepatic Neg liver ROS, GERD  Medicated and Controlled,  Endo/Other  Hypothyroidism   Renal/GU negative Renal ROS  negative genitourinary   Musculoskeletal  (+) Arthritis , Osteoarthritis,    Abdominal   Peds  Hematology negative hematology ROS (+)   Anesthesia Other Findings   Reproductive/Obstetrics negative OB ROS                            Anesthesia Physical Anesthesia Plan  ASA: III  Anesthesia Plan: MAC   Post-op Pain Management:    Induction: Intravenous  PONV Risk Score and Plan: 4 or greater and Ondansetron, Dexamethasone, Propofol, Midazolam and Scopolamine patch - Pre-op  Airway Management Planned: Simple Face Mask  Additional Equipment:   Intra-op Plan:   Post-operative Plan:   Informed Consent: I have reviewed the patients History and Physical, chart, labs and discussed the procedure including the risks, benefits and alternatives for the proposed anesthesia with the patient or authorized representative who has indicated his/her understanding and acceptance.   Dental advisory given  Plan Discussed with: CRNA  Anesthesia Plan Comments:        Anesthesia Quick  Evaluation

## 2017-02-24 NOTE — Pre-Procedure Instructions (Signed)
    [name]  02/24/2017      [place] [place] [address] Phone: [phone] Fax: [place], [place] [address] [place] [address] Phone: [phone] Fax: [phone]    Your procedure is scheduled on Thursday, February 25, 2017  Report to [facility] Admitting at 5:30 A.M.  Call this number if you have problems the morning of surgery:  [phone]   Remember:  Do not eat food or drink liquids after midnight.  Take these medicines the morning of surgery with A SIP OF WATER: Synthroid,Topiramate, RABEprazole (ACIPHEX), PRN: Tylenol, eye drops, Albuterol inhaler ( bring in with you ) Stop taking Aspirin, vitamins, fish oil and herbal medications. Do not take any NSAIDs ie: Ibuprofen, Advil, Naproxen ( Anaprox), Mobic, Motrin, BC and Goody Powder or any medication containing Aspirin; stop now.  Do not wear jewelry, make-up or nail polish.  Do not wear lotions, powders, or perfumes, or deoderant.  Do not shave 48 hours prior to surgery.    Do not bring valuables to the hospital.  [facility] is not responsible for any belongings or valuables.  Contacts, dentures or bridgework may not be worn into surgery.  Leave your suitcase in the car.  After surgery it may be brought to your room.  For patients admitted to the hospital, discharge time will be determined by your treatment team.  Patients discharged the day of surgery will not be allowed to drive home.   Please read over the following fact sheets that you were given.

## 2017-02-24 NOTE — Progress Notes (Signed)
Pt denies SOB and chest pain. Pt under the care of Dr. Marlou Porch, Cardiology. Pt denies having a stress test and cardiac cath. Pt denies having an EKG and chest x ray within the last year. Pre-op instructions printed and given to pt. Pt verbalized understanding of all pre-op instructions. Anesthesia asked to review pt history ( see note).

## 2017-02-25 ENCOUNTER — Ambulatory Visit (HOSPITAL_COMMUNITY): Payer: 59 | Admitting: Emergency Medicine

## 2017-02-25 ENCOUNTER — Ambulatory Visit (HOSPITAL_COMMUNITY)
Admission: RE | Admit: 2017-02-25 | Discharge: 2017-02-25 | Disposition: A | Discharge status: Home / Self Care | Payer: 59 | Source: Ambulatory Visit | Attending: Orthopaedic Surgery | Admitting: Orthopaedic Surgery

## 2017-02-25 ENCOUNTER — Encounter (HOSPITAL_COMMUNITY): Payer: Self-pay

## 2017-02-25 ENCOUNTER — Encounter (HOSPITAL_COMMUNITY)
Admission: RE | Disposition: A | Discharge status: Home / Self Care | Payer: Self-pay | Source: Ambulatory Visit | Attending: Orthopaedic Surgery

## 2017-02-25 DIAGNOSIS — G5602 Carpal tunnel syndrome, left upper limb: Secondary | ICD-10-CM

## 2017-02-25 DIAGNOSIS — I10 Essential (primary) hypertension: Secondary | ICD-10-CM | POA: Diagnosis not present

## 2017-02-25 DIAGNOSIS — I340 Nonrheumatic mitral (valve) insufficiency: Secondary | ICD-10-CM | POA: Insufficient documentation

## 2017-02-25 DIAGNOSIS — Z79899 Other long term (current) drug therapy: Secondary | ICD-10-CM | POA: Diagnosis not present

## 2017-02-25 DIAGNOSIS — Z96652 Presence of left artificial knee joint: Secondary | ICD-10-CM | POA: Insufficient documentation

## 2017-02-25 DIAGNOSIS — K219 Gastro-esophageal reflux disease without esophagitis: Secondary | ICD-10-CM | POA: Insufficient documentation

## 2017-02-25 DIAGNOSIS — Z940 Kidney transplant status: Secondary | ICD-10-CM | POA: Diagnosis not present

## 2017-02-25 DIAGNOSIS — E785 Hyperlipidemia, unspecified: Secondary | ICD-10-CM | POA: Diagnosis not present

## 2017-02-25 DIAGNOSIS — I341 Nonrheumatic mitral (valve) prolapse: Secondary | ICD-10-CM | POA: Insufficient documentation

## 2017-02-25 DIAGNOSIS — Z885 Allergy status to narcotic agent status: Secondary | ICD-10-CM | POA: Diagnosis not present

## 2017-02-25 DIAGNOSIS — E039 Hypothyroidism, unspecified: Secondary | ICD-10-CM | POA: Insufficient documentation

## 2017-02-25 DIAGNOSIS — F419 Anxiety disorder, unspecified: Secondary | ICD-10-CM | POA: Insufficient documentation

## 2017-02-25 DIAGNOSIS — Z87891 Personal history of nicotine dependence: Secondary | ICD-10-CM | POA: Diagnosis not present

## 2017-02-25 DIAGNOSIS — G4730 Sleep apnea, unspecified: Secondary | ICD-10-CM | POA: Insufficient documentation

## 2017-02-25 HISTORY — DX: Carpal tunnel syndrome, unspecified upper limb: G56.00

## 2017-02-25 HISTORY — DX: Unspecified osteoarthritis, unspecified site: M19.90

## 2017-02-25 HISTORY — DX: Sleep apnea, unspecified: G47.30

## 2017-02-25 HISTORY — PX: CARPAL TUNNEL RELEASE: SHX101

## 2017-02-25 LAB — BASIC METABOLIC PANEL
ANION GAP: 6 (ref 5–15)
BUN: 21 mg/dL — AB (ref 6–20)
CALCIUM: 9.6 mg/dL (ref 8.9–10.3)
CO2: 27 mmol/L (ref 22–32)
CREATININE: 1.23 mg/dL — AB (ref 0.44–1.00)
Chloride: 105 mmol/L (ref 101–111)
GFR calc Af Amer: 53 mL/min — ABNORMAL LOW (ref >60)
GFR, EST NON AFRICAN AMERICAN: 46 mL/min — AB (ref >60)
GLUCOSE: 107 mg/dL — AB (ref 65–99)
Potassium: 4.4 mmol/L (ref 3.5–5.1)
Sodium: 138 mmol/L (ref 135–145)

## 2017-02-25 LAB — CBC
HEMATOCRIT: 42.7 % (ref 36.0–46.0)
Hemoglobin: 14 g/dL (ref 12.0–15.0)
MCH: 27.8 pg (ref 26.0–34.0)
MCHC: 32.8 g/dL (ref 30.0–36.0)
MCV: 84.7 fL (ref 78.0–100.0)
PLATELETS: 329 10*3/uL (ref 150–400)
RBC: 5.04 MIL/uL (ref 3.87–5.11)
RDW: 14.3 % (ref 11.5–15.5)
WBC: 4.9 10*3/uL (ref 4.0–10.5)

## 2017-02-25 SURGERY — CARPAL TUNNEL RELEASE
Anesthesia: Monitor Anesthesia Care | Laterality: Left

## 2017-02-25 MED ORDER — ONDANSETRON HCL 4 MG/2ML IJ SOLN
INTRAMUSCULAR | Status: AC
  Filled 2017-02-25: qty 2

## 2017-02-25 MED ORDER — DEXAMETHASONE SODIUM PHOSPHATE 10 MG/ML IJ SOLN
INTRAMUSCULAR | Status: AC
  Filled 2017-02-25: qty 1

## 2017-02-25 MED ORDER — TRAMADOL HCL 50 MG PO TABS: 100.0000 mg | ORAL_TABLET | Freq: Four times a day (QID) | ORAL | 0 refills | Status: DC | PRN

## 2017-02-25 MED ORDER — MIDAZOLAM HCL 5 MG/5ML IJ SOLN
INTRAMUSCULAR | Status: DC | PRN
  Administered 2017-02-25: 2 mg via INTRAVENOUS

## 2017-02-25 MED ORDER — PROPOFOL 500 MG/50ML IV EMUL
INTRAVENOUS | Status: DC | PRN
  Administered 2017-02-25: 100 ug/kg/min via INTRAVENOUS

## 2017-02-25 MED ORDER — BUPIVACAINE HCL (PF) 0.25 % IJ SOLN
INTRAMUSCULAR | Status: AC
  Filled 2017-02-25: qty 30

## 2017-02-25 MED ORDER — LACTATED RINGERS IV SOLN
INTRAVENOUS | Status: DC | PRN
  Administered 2017-02-25: 07:00:00 via INTRAVENOUS

## 2017-02-25 MED ORDER — PROPOFOL 10 MG/ML IV BOLUS
INTRAVENOUS | Status: AC
Start: 2017-02-25 — End: ?
  Filled 2017-02-25: qty 20

## 2017-02-25 MED ORDER — ONDANSETRON HCL 4 MG/2ML IJ SOLN
INTRAMUSCULAR | Status: DC | PRN
  Administered 2017-02-25: 4 mg via INTRAVENOUS

## 2017-02-25 MED ORDER — LIDOCAINE HCL 1 % IJ SOLN
INTRAMUSCULAR | Status: DC | PRN
  Administered 2017-02-25: 30 mL

## 2017-02-25 MED ORDER — FENTANYL CITRATE (PF) 100 MCG/2ML IJ SOLN
INTRAMUSCULAR | Status: DC | PRN
Start: 2017-02-25 — End: 2017-02-25
  Administered 2017-02-25: 50 ug via INTRAVENOUS

## 2017-02-25 MED ORDER — MIDAZOLAM HCL 2 MG/2ML IJ SOLN
INTRAMUSCULAR | Status: AC
  Filled 2017-02-25: qty 2

## 2017-02-25 MED ORDER — FENTANYL CITRATE (PF) 250 MCG/5ML IJ SOLN
INTRAMUSCULAR | Status: AC
  Filled 2017-02-25: qty 5

## 2017-02-25 MED ORDER — CEFAZOLIN SODIUM-DEXTROSE 2-4 GM/100ML-% IV SOLN
2.0000 g | INTRAVENOUS | Status: AC
  Administered 2017-02-25: 2 g via INTRAVENOUS
  Filled 2017-02-25: qty 100

## 2017-02-25 MED ORDER — BUPIVACAINE HCL 0.25 % IJ SOLN
INTRAMUSCULAR | Status: DC | PRN
  Administered 2017-02-25: 30 mL

## 2017-02-25 MED ORDER — 0.9 % SODIUM CHLORIDE (POUR BTL) OPTIME
TOPICAL | Status: DC | PRN
  Administered 2017-02-25: 1000 mL

## 2017-02-25 MED ORDER — FENTANYL CITRATE (PF) 100 MCG/2ML IJ SOLN: 25.0000 ug | INTRAMUSCULAR | Status: DC | PRN

## 2017-02-25 MED ORDER — CHLORHEXIDINE GLUCONATE 4 % EX LIQD: 60.0000 mL | Freq: Once | CUTANEOUS | Status: DC

## 2017-02-25 MED ORDER — SCOPOLAMINE 1 MG/3DAYS TD PT72
MEDICATED_PATCH | TRANSDERMAL | Status: AC
  Filled 2017-02-25: qty 1

## 2017-02-25 MED ORDER — LIDOCAINE HCL (PF) 1 % IJ SOLN
INTRAMUSCULAR | Status: AC
  Filled 2017-02-25: qty 30

## 2017-02-25 MED ORDER — DEXAMETHASONE SODIUM PHOSPHATE 10 MG/ML IJ SOLN
INTRAMUSCULAR | Status: DC | PRN
  Administered 2017-02-25: 10 mg via INTRAVENOUS

## 2017-02-25 SURGICAL SUPPLY — 40 items
BANDAGE ACE 3X5.8 VEL STRL LF (GAUZE/BANDAGES/DRESSINGS) ×2 IMPLANT
BANDAGE ACE 4X5 VEL STRL LF (GAUZE/BANDAGES/DRESSINGS) IMPLANT
BANDAGE ELASTIC 4 VELCRO ST LF (GAUZE/BANDAGES/DRESSINGS) ×2 IMPLANT
BNDG ESMARK 4X9 LF (GAUZE/BANDAGES/DRESSINGS) ×2 IMPLANT
BNDG GAUZE ELAST 4 BULKY (GAUZE/BANDAGES/DRESSINGS) IMPLANT
CORDS BIPOLAR (ELECTRODE) ×2 IMPLANT
COVER SURGICAL LIGHT HANDLE (MISCELLANEOUS) ×2 IMPLANT
CUFF TOURNIQUET SINGLE 18IN (TOURNIQUET CUFF) ×2 IMPLANT
CUFF TOURNIQUET SINGLE 24IN (TOURNIQUET CUFF) IMPLANT
DRAPE SURG 17X23 STRL (DRAPES) IMPLANT
DRAPE U-SHAPE 47X51 STRL (DRAPES) ×2 IMPLANT
DURAPREP 26ML APPLICATOR (WOUND CARE) ×2 IMPLANT
GAUZE SPONGE 4X4 12PLY STRL (GAUZE/BANDAGES/DRESSINGS) ×2 IMPLANT
GAUZE XEROFORM 1X8 LF (GAUZE/BANDAGES/DRESSINGS) ×2 IMPLANT
GLOVE BIO SURGEON STRL SZ8 (GLOVE) ×2 IMPLANT
GLOVE BIOGEL PI IND STRL 8 (GLOVE) ×2 IMPLANT
GLOVE BIOGEL PI INDICATOR 8 (GLOVE) ×2
GLOVE ORTHO TXT STRL SZ7.5 (GLOVE) ×2 IMPLANT
GOWN STRL REUS W/ TWL LRG LVL3 (GOWN DISPOSABLE) ×2 IMPLANT
GOWN STRL REUS W/ TWL XL LVL3 (GOWN DISPOSABLE) ×1 IMPLANT
GOWN STRL REUS W/TWL LRG LVL3 (GOWN DISPOSABLE) ×2
GOWN STRL REUS W/TWL XL LVL3 (GOWN DISPOSABLE) ×1
KIT BASIN OR (CUSTOM PROCEDURE TRAY) ×2 IMPLANT
KIT ROOM TURNOVER OR (KITS) ×2 IMPLANT
NEEDLE 22X1 1/2 (OR ONLY) (NEEDLE) IMPLANT
NEEDLE HYPO 25GX1X1/2 BEV (NEEDLE) ×4 IMPLANT
NS IRRIG 1000ML POUR BTL (IV SOLUTION) ×2 IMPLANT
PACK ORTHO EXTREMITY (CUSTOM PROCEDURE TRAY) ×2 IMPLANT
PAD ARMBOARD 7.5X6 YLW CONV (MISCELLANEOUS) ×4 IMPLANT
PAD CAST 4YDX4 CTTN HI CHSV (CAST SUPPLIES) ×1 IMPLANT
PADDING CAST COTTON 4X4 STRL (CAST SUPPLIES) ×1
SUCTION FRAZIER HANDLE 10FR (MISCELLANEOUS) ×1
SUCTION TUBE FRAZIER 10FR DISP (MISCELLANEOUS) ×1 IMPLANT
SUT ETHILON 3 0 PS 1 (SUTURE) ×2 IMPLANT
SUT ETHILON 4 0 PS 2 18 (SUTURE) IMPLANT
SYR CONTROL 10ML LL (SYRINGE) ×4 IMPLANT
TOWEL OR 17X26 10 PK STRL BLUE (TOWEL DISPOSABLE) ×2 IMPLANT
TUBE CONNECTING 12X1/4 (SUCTIONS) IMPLANT
UNDERPAD 30X30 (UNDERPADS AND DIAPERS) ×2 IMPLANT
WATER STERILE IRR 1000ML POUR (IV SOLUTION) ×2 IMPLANT

## 2017-02-25 NOTE — Transfer of Care (Signed)
Immediate Anesthesia Transfer of Care Note  Patient: [name]  Procedure(s) Performed: Procedure(s): LEFT OPEN CARPAL TUNNEL RELEASE (Left)  Patient Location: PACU  Anesthesia Type:MAC  Level of Consciousness: awake, alert  and oriented  Airway & Oxygen Therapy: Patient Spontanous Breathing and Patient connected to nasal cannula oxygen  Post-op Assessment: Report given to RN, Post -op Vital signs reviewed and stable and Patient moving all extremities X 4  Post vital signs: Reviewed and stable  Last Vitals:  Vitals:   02/25/17 0625 02/25/17 0800  BP: 136/64 (P) 115/68  Pulse: 67   Resp: 18 (P) 13  Temp: 36.6 C 36.7 C    Last Pain:  Vitals:   02/25/17 0625  TempSrc: Oral         Complications: No apparent anesthesia complications

## 2017-02-25 NOTE — Anesthesia Procedure Notes (Signed)
Procedure Name: MAC Date/Time: 02/25/2017 7:34 AM Performed by: Kyung Rudd Pre-anesthesia Checklist: Patient identified, Emergency Drugs available, Suction available and Patient being monitored Patient Re-evaluated:Patient Re-evaluated prior to induction Oxygen Delivery Method: Simple face mask Induction Type: IV induction Placement Confirmation: positive ETCO2

## 2017-02-25 NOTE — Op Note (Signed)
NAME:  [name], [name]                    ACCOUNT NO.:  MEDICAL RECORD NO.:  [id number]  LOCATION:                                 FACILITY:  PHYSICIAN:  Lind Guest. Ninfa Linden, M.D.DATE OF BIRTH:  DATE OF PROCEDURE:  02/25/2017 DATE OF DISCHARGE:                              OPERATIVE REPORT   PREOPERATIVE DIAGNOSIS:  Severe carpal tunnel syndrome, left upper extremity.  POSTOPERATIVE DIAGNOSIS:  Severe carpal tunnel syndrome, left upper extremity.  PROCEDURE:  Left open carpal tunnel release.  SURGEON:  Lind Guest. Ninfa Linden, M.D.  ASSISTANT:  Gean Maidens, P.A. student.  ANESTHESIA: 1. Mask ventilation IV sedation. 2. Local in the left palm with 1% plain lidocaine followed by 0.25%     plain Marcaine.  ANTIBIOTICS:  IV Ancef 2 g.  TOURNIQUET TIME:  Less than 15 minutes.  BLOOD LOSS:  Minimal.  COMPLICATIONS:  None.  INDICATIONS:  [name] is a 61 year old, well known to me.  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  We followed her for a long period of time with carpal tunnel syndrome.  Her hand gets numb daily.  We have injected this several times with steroid.  At this point, it is becoming frustrating enough, therefore she does wish to proceed with a carpal tunnel release.  She understands risks of acute blood loss anemia and mainly injury to the nerve as well as the risks of incomplete release.  She understands our goals are decreasing her symptoms from severe carpal tunnel syndrome.  PROCEDURE DESCRIPTION:  After informed consent was obtained, appropriate left hand and wrist were marked.  She was brought to the operating room, placed supine on the operating table.  A nonsterile tourniquet was placed around her upper left arm and her left hand and wrist were prepped and draped with DuraPrep and sterile drapes.  A time-out was called and she was identified as correct patient, correct left upper extremity.  She was then given some  propofol, some light sedation, and we were able to anesthetize in the palm with first 1% plain lidocaine followed by 0.25% plain Sensorcaine.  We then made incision starting at the distal edge of the transverse carpal ligament and carried this proximally in line with the fourth ray.  We were able to dissect down and protect the median nerve at the distal edge.  We then slowly and meticulously divided the transverse carpal ligament, assessing the median nerve and finding it to be intact, but flattened as well as its motor branch intact.  We then irrigated the soft tissue with normal saline solution.  We reapproximated the skin with interrupted nylon suture.  Xeroform and well-padded sterile dressing were applied. With the tourniquet was let down, the fingers pinked nicely.  She was taken to the recovery room in stable condition.  All final counts were correct.  There were no complications noted.     Lind Guest. Ninfa Linden, M.D.     CYB/MEDQ  D:  02/25/2017  T:  02/25/2017  Job:  [id number]

## 2017-02-25 NOTE — Discharge Instructions (Signed)
Use your left hand as comfort allows. Expect swelling - ice and elevation as needed. Leave your current dressing on until Monday 7/23. Starting Monday, you can remove your dressing and get your incision wet in the shower. Large band-aid daily starting Monday.

## 2017-02-25 NOTE — H&P (Signed)
[name] is an 61 y.o. female.   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  Has tried and failed conservative treatment.  At this point, she wishes to proceed with a left open carpal tunnel release.  Past Medical History:  Diagnosis Date  . Allergy   . Anxiety   . Aortic insufficiency    Mild  . Arthritis   . Carpal tunnel syndrome   . Dysrhythmia    pvc's at times  . GERD (gastroesophageal reflux disease)   . Hyperlipidemia   . Hypothyroidism   . Migraine headache   . Mitral regurgitation   . Mitral valve prolapse   . Nausea alone   . Need for SBE (subacute bacterial endocarditis) prophylaxis   . Pilonidal cyst without mention of abscess   . Pneumonia Aug 14 2012  . PONV (postoperative nausea and vomiting)   . Sleep apnea    wears CPAP  . Thyroid disease   . Transplanted kidney removed    donated kidney    Past Surgical History:  Procedure Laterality Date  . ABDOMINAL HYSTERECTOMY    . CARPAL TUNNEL RELEASE Right   . COLONOSCOPY    . KNEE SURGERY    . NEPHRECTOMY    . TONSILLECTOMY    . TOTAL KNEE ARTHROPLASTY Left 09/14/2013   Procedure: LEFT TOTAL KNEE ARTHROPLASTY;  Surgeon: [name], MD;  Location: [facility];  Service: Orthopedics;  Laterality: Left;  . TUBAL LIGATION    . WISDOM TOOTH EXTRACTION      Family History  Problem Relation Age of Onset  . Diabetes Mother   . Hypertension Mother   . Heart disease Father   . Hypertension Father   . Thyroid disease Father   . Cancer Maternal Aunt   . Cancer Maternal Aunt   . Colon cancer Neg Hx   . Esophageal cancer Neg Hx   . Rectal cancer Neg Hx   . Stomach cancer Neg Hx    Social History:  reports that she quit smoking about 33 years ago. She has never used smokeless tobacco. She reports that she drinks alcohol. She reports that she does not use drugs.  Allergies:   Allergies  Allergen Reactions  . Hydrocodone-Acetaminophen Nausea And Vomiting    dizziness  . Lipitor [Atorvastatin]     Ear pain  . Morphine And Related Nausea And Vomiting  . Percocet [Oxycodone-Acetaminophen]     Tachycardia, syncope  . Shrimp [Shellfish Allergy] Itching, Nausea And Vomiting and Swelling    Medications Prior to Admission  Medication Sig Dispense Refill  . acetaminophen (TYLENOL) 500 MG tablet Take 1,000 mg by mouth every 6 (six) hours as needed for moderate pain.    . Armodafinil 150 MG tablet Take 1 tablet (150 mg total) by mouth daily. (Patient taking differently: Take 75 mg by mouth daily. Monday through Friday) 30 tablet 5  . calcium carbonate (TUMS - DOSED IN MG ELEMENTAL CALCIUM) 500 MG chewable tablet Chew 1 tablet by mouth as needed for indigestion or heartburn.    . Carboxymethylcellul-Glycerin (LUBRICATING EYE DROPS OP) Apply 1 drop to eye daily as needed (dry eyes).    . cetirizine (ZYRTEC) 10 MG tablet Take 1 tablet (10 mg total) by mouth at bedtime. 90 tablet 3  . Cholecalciferol (VITAMIN D3) 3000 units TABS Take 3,000 Units by  mouth. Daily on Wed and Sat    . citalopram (CELEXA) 20 MG tablet Take 1 tablet (20 mg total) by mouth daily. (Patient taking differently: Take 20 mg by mouth at bedtime. ) 90 tablet 2  . levothyroxine (SYNTHROID, LEVOTHROID) 137 MCG tablet Take 137 mcg by mouth daily before breakfast.     . lisinopril-hydrochlorothiazide (PRINZIDE,ZESTORETIC) 10-12.5 MG tablet Take 1 tablet by mouth daily.     . meloxicam (MOBIC) 15 MG tablet Take 1 tablet (15 mg total) by mouth daily. (Patient taking differently: Take 15 mg by mouth daily as needed for pain. ) 30 tablet 1  . naproxen sodium (ANAPROX) 220 MG tablet Take 220-440 mg by mouth daily as needed (pain).    . RABEprazole (ACIPHEX) 20 MG tablet Take 20 mg by mouth daily.     . rosuvastatin (CRESTOR) 10 MG tablet Take 1 tablet (10 mg total) by mouth daily. (Patient taking differently: Take  10 mg by mouth every evening. ) 90 tablet 3  . albuterol (PROVENTIL HFA;VENTOLIN HFA) 108 (90 Base) MCG/ACT inhaler Inhale 2 puffs into the lungs every 6 (six) hours as needed for wheezing or shortness of breath.    . esomeprazole (NEXIUM) 40 MG capsule TAKE 1 CAPSULE (40 MG TOTAL) BY MOUTH DAILY BEFORE BREAKFAST. (Patient not taking: Reported on 02/23/2017) 90 capsule 3  . Liraglutide -Weight Management (SAXENDA) 18 MG/3ML SOPN Inject 0.6 mg into the skin daily. For one week then increase by .58m weekly until reaches 340mdaily.  Please include ultra fine needles 66m55m pen 1  . Topiramate ER 50 MG CP24 Take 1 tablet by mouth daily. 90 capsule 0    Results for orders placed or performed during the hospital encounter of 02/25/17 (from the past 48 hour(s))  CBC     Status: None   Collection Time: 02/25/17  6:18 AM  Result Value Ref Range   WBC 4.9 4.0 - 10.5 K/uL   RBC 5.04 3.87 - 5.11 MIL/uL   Hemoglobin 14.0 12.0 - 15.0 g/dL   HCT 42.7 36.0 - 46.0 %   MCV 84.7 78.0 - 100.0 fL   MCH 27.8 26.0 - 34.0 pg   MCHC 32.8 30.0 - 36.0 g/dL   RDW 14.3 11.5 - 15.5 %   Platelets 329 150 - 400 K/uL  Basic metabolic panel     Status: Abnormal   Collection Time: 02/25/17  6:18 AM  Result Value Ref Range   Sodium 138 135 - 145 mmol/L   Potassium 4.4 3.5 - 5.1 mmol/L    Comment: SLIGHT HEMOLYSIS   Chloride 105 101 - 111 mmol/L   CO2 27 22 - 32 mmol/L   Glucose, Bld 107 (H) 65 - 99 mg/dL   BUN 21 (H) 6 - 20 mg/dL   Creatinine, Ser 1.23 (H) 0.44 - 1.00 mg/dL   Calcium 9.6 8.9 - 10.3 mg/dL   GFR calc non Af Amer 46 (L) >60 mL/min   GFR calc Af Amer 53 (L) >60 mL/min    Comment: (NOTE) The eGFR has been calculated using the CKD EPI equation. This calculation has not been validated in all clinical situations. eGFR's persistently <60 mL/min signify possible Chronic Kidney Disease.    Anion gap 6 5 - 15   No results found.  Review of Systems  All other systems reviewed and are negative.   Blood  pressure 136/64, pulse 67, temperature 97.8 F (36.6 C), temperature source Oral, resp. rate 18, height 5' 5"  (1.651 m),  weight 214 lb 8 oz (97.3 kg), SpO2 99 %. Physical Exam  Constitutional: She is oriented to person, place, and time. She appears well-developed and well-nourished.  HENT:  Head: Normocephalic and atraumatic.  Eyes: Pupils are equal, round, and reactive to light. EOM are normal.  Neck: Normal range of motion. Neck supple.  Cardiovascular: Normal rate and regular rhythm.   Respiratory: Effort normal and breath sounds normal.  GI: Soft. Bowel sounds are normal.  Musculoskeletal:       Left hand: Decreased sensation noted. Decreased sensation is present in the medial distribution.  Neurological: She is alert and oriented to person, place, and time.  Skin: Skin is warm and dry.  Psychiatric: She has a normal mood and affect.     Assessment/Plan Significant carpal tunnel syndrome left upper extremity  1)  To the OR today as an outpatient for a left carpal tunnel release.  Informed consent is obtained.  [name], MD 02/25/2017, 7:19 AM

## 2017-02-25 NOTE — Brief Op Note (Signed)
02/25/2017  8:01 AM  PATIENT:  [name]  61 y.o. female  PRE-OPERATIVE DIAGNOSIS:  left carpal tunnel syndrome  POST-OPERATIVE DIAGNOSIS:  left carpal tunnel syndrome  PROCEDURE:  Procedure(s): LEFT OPEN CARPAL TUNNEL RELEASE (Left)  SURGEON:  Surgeon(s) and Role:    [name], MD - Primary  PHYSICIAN ASSISTANT: Royal Hawthorn, [place] student  ANESTHESIA:   local and IV sedation  EBL:  No intake/output data recorded.  COUNTS:  YES  TOURNIQUET:   Total Tourniquet Time Documented: Upper Arm (Left) - 12 minutes Total: Upper Arm (Left) - 12 minutes   DICTATION: .Other Dictation: Dictation Number [id number]  PLAN OF CARE: Discharge to home after PACU  PATIENT DISPOSITION:  PACU - hemodynamically stable.   Delay start of Pharmacological VTE agent (>24hrs) due to surgical blood loss or risk of bleeding: not applicable

## 2017-02-25 NOTE — Anesthesia Postprocedure Evaluation (Signed)
Anesthesia Post Note  Patient: [name]  Procedure(s) Performed: Procedure(s) (LRB): LEFT OPEN CARPAL TUNNEL RELEASE (Left)     Patient location during evaluation: PACU Anesthesia Type: MAC Level of consciousness: awake and alert Pain management: pain level controlled Vital Signs Assessment: post-procedure vital signs reviewed and stable Respiratory status: spontaneous breathing, nonlabored ventilation and respiratory function stable Cardiovascular status: stable and blood pressure returned to baseline Anesthetic complications: no    Last Vitals:  Vitals:   02/25/17 0815 02/25/17 0819  BP: (!) 106/46 (!) 106/46  Pulse: 68 66  Resp: 20 17  Temp:      Last Pain:  Vitals:   02/25/17 0800  TempSrc:   PainSc: 0-No pain                 Shanna Un,W. EDMOND

## 2017-02-26 ENCOUNTER — Encounter (HOSPITAL_COMMUNITY): Payer: Self-pay | Admitting: Orthopaedic Surgery

## 2017-03-10 MED ORDER — VERAPAMIL HCL ER 100 MG PO CP24: 100.0000 mg | ORAL_CAPSULE | Freq: Every day | ORAL | 3 refills | Status: DC

## 2017-03-10 NOTE — Addendum Note (Signed)
Addended by: Nelson Chimes E on: 03/10/2017 05:05 PM   Modules accepted: Orders

## 2017-03-10 NOTE — Telephone Encounter (Signed)
Recommend switching blood pressure medication to Verapamil. This medication can treat both hypertension and chronic headaches/migraine.  Discontinue Lisinopril-HCTZ Start Verapamil, take 1 capsule at bedtime daily Monitor blood pressures at home Follow-up in 4 weeks with Iran Planas PA-C

## 2017-03-10 NOTE — Telephone Encounter (Signed)
[employer] did not approve PA for topiramate. Please advise.

## 2017-03-11 ENCOUNTER — Ambulatory Visit (INDEPENDENT_AMBULATORY_CARE_PROVIDER_SITE_OTHER): Payer: 59 | Admitting: Orthopaedic Surgery

## 2017-03-11 DIAGNOSIS — Z9889 Other specified postprocedural states: Secondary | ICD-10-CM

## 2017-03-11 NOTE — Progress Notes (Signed)
[name] is 2 weeks status post an open carpal tunnel release of her left side. She got immediate relief she reports from a lot of her pain after surgery. She is doing well. Interval examination I removed all sutures. There is a small area in the central aspect of her incision with slight dehiscence but otherwise it looks good. I talked her about local wound care to take care of this and she agreed. Since an oversew well from the hospital she'll follow-up as needed she knows to contact me personally there is any issues.

## 2017-04-07 ENCOUNTER — Encounter: Payer: Self-pay | Admitting: Physician Assistant

## 2017-04-07 ENCOUNTER — Ambulatory Visit (INDEPENDENT_AMBULATORY_CARE_PROVIDER_SITE_OTHER): Payer: 59 | Admitting: Physician Assistant

## 2017-04-07 VITALS — BP 90/68 | HR 79 | Wt 209.0 lb

## 2017-04-07 DIAGNOSIS — I10 Essential (primary) hypertension: Secondary | ICD-10-CM | POA: Diagnosis not present

## 2017-04-07 DIAGNOSIS — E669 Obesity, unspecified: Secondary | ICD-10-CM

## 2017-04-07 DIAGNOSIS — K2100 Gastro-esophageal reflux disease with esophagitis, without bleeding: Secondary | ICD-10-CM

## 2017-04-07 DIAGNOSIS — F411 Generalized anxiety disorder: Secondary | ICD-10-CM | POA: Diagnosis not present

## 2017-04-07 MED ORDER — LISINOPRIL 2.5 MG PO TABS: 2.5000 mg | ORAL_TABLET | Freq: Every day | ORAL | 1 refills | Status: DC

## 2017-04-07 MED ORDER — LIRAGLUTIDE -WEIGHT MANAGEMENT 18 MG/3ML ~~LOC~~ SOPN: 3.0000 mg | PEN_INJECTOR | Freq: Every day | SUBCUTANEOUS | 2 refills | Status: DC

## 2017-04-07 MED ORDER — CITALOPRAM HYDROBROMIDE 20 MG PO TABS: 20.0000 mg | ORAL_TABLET | Freq: Every day | ORAL | 3 refills | Status: DC

## 2017-04-07 MED ORDER — ESOMEPRAZOLE MAGNESIUM 40 MG PO CPDR: DELAYED_RELEASE_CAPSULE | ORAL | 3 refills | Status: DC

## 2017-04-07 MED FILL — LISINOPRIL 2.5 MG TABLET: 2.5 | 90 days supply | Qty: 90 | Fill #0

## 2017-04-07 MED FILL — SAXENDA 18 MG/3 ML PEN: 18 | 30 days supply | Qty: 15 | Fill #0

## 2017-04-07 MED FILL — TECHLITE PEN NDL 32GX1/4": 32G X 6 MM | 90 days supply | Qty: 100 | Fill #0

## 2017-04-07 MED FILL — ESOMEPRAZOLE MAGNESIUM 40 M: 40 | 90 days supply | Qty: 90 | Fill #0

## 2017-04-07 MED FILL — CITALOPRAM HBR 20 MG TABLET: 20 | 90 days supply | Qty: 90 | Fill #0

## 2017-04-07 MED FILL — TECHLITE PEN NDL 32GX1/4: 32G X 6 MM | 90 days supply | Qty: 100 | Fill #0

## 2017-04-07 NOTE — Patient Instructions (Signed)
Decrease celexa to 10mg  and recheck in 2 months.  .Discussed low carb diet with 1500 calories and 80g of protein.  Exercising at least 150 minutes a week.  [employer] could be a Microbiologist.

## 2017-04-07 NOTE — Progress Notes (Signed)
Subjective:    Patient ID: [name], female    DOB: Aug 05, 1956, 61 y.o.   MRN: [id number]  HPI  [name] is a 61 yo female who presents to the clinic for weight follow up and refill.   4 weeks ago she started on saxenda. She just got up to 3mg  dose. She has lost 6lbs. She is feeling really good. She is tolerating the medication well.   Her BP is low today. Despite not being on med list she is taking lisinopril/HCTZ for HTN. She denies any dizziness, weakness, sweating, headaches.   GERD- [name] would like to switch back to nexium from aciphex. She feels like nexium worse better.   Anxiety- she needs refill on celexa. She has been cutting in half because she is about to run out and cannot tell a difference in mood.   .. Active Ambulatory Problems    Diagnosis Date Noted  . UTI (urinary tract infection) 07/14/2012  . Other and unspecified hyperlipidemia 07/14/2012  . MVP (mitral valve prolapse) 07/14/2012  . GERD (gastroesophageal reflux disease) 07/14/2012  . Allergic rhinitis 07/14/2012  . Mitral regurgitation 07/14/2012  . S/P hysterectomy 07/14/2012  . Essential hypertension, benign 04/29/2013  . Wheezing 04/29/2013  . Insomnia 04/29/2013  . Unspecified vitamin D deficiency 04/29/2013  . Migraine headache 04/29/2013  . Anxiety state 04/29/2013  . Unspecified hypothyroidism 04/29/2013  . Obesity (BMI 30-39.9) 07/17/2013  . Cough 08/15/2013  . Arthritis of left knee 09/14/2013  . Status post total knee replacement 09/14/2013  . Abdominal pain, chronic, right upper quadrant 11/16/2013  . Candidiasis of female genitalia 11/16/2013  . Elevated liver enzymes 11/16/2013  . Folliculitis 27/78/2423  . Nausea alone 11/18/2013  . Abnormal transaminases 11/18/2013  . Hyperlipidemia 11/22/2014  . Single kidney 11/22/2014  . Hypothyroidism 11/22/2014  . OSA (obstructive sleep apnea) 12/23/2015  . Daytime sleepiness 01/24/2017  . Carpal tunnel syndrome, left upper limb 02/25/2017  .  Status post carpal tunnel release 03/11/2017   Resolved Ambulatory Problems    Diagnosis Date Noted  . Hypothyroidism 07/14/2012   Past Medical History:  Diagnosis Date  . Allergy   . Anxiety   . Aortic insufficiency   . Arthritis   . Carpal tunnel syndrome   . Dysrhythmia   . GERD (gastroesophageal reflux disease)   . Hyperlipidemia   . Hypothyroidism   . Migraine headache   . Mitral regurgitation   . Mitral valve prolapse   . Nausea alone   . Need for SBE (subacute bacterial endocarditis) prophylaxis   . Pilonidal cyst without mention of abscess   . Pneumonia Aug 14 2012  . PONV (postoperative nausea and vomiting)   . Sleep apnea   . Thyroid disease   . Transplanted kidney removed     Review of Systems  All other systems reviewed and are negative.      Objective:   Physical Exam  Constitutional: She is oriented to person, place, and time. She appears well-developed and well-nourished.  HENT:  Head: Normocephalic and atraumatic.  Cardiovascular: Normal rate, regular rhythm and normal heart sounds.   Pulmonary/Chest: Effort normal and breath sounds normal. She has no wheezes.  Neurological: She is alert and oriented to person, place, and time.  Psychiatric: She has a normal mood and affect. Her behavior is normal.          Assessment & Plan:  Marland KitchenMarland KitchenCelena was seen today for weight check.  Diagnoses and all orders for this visit:  Obesity (  BMI 30-39.9) -     Liraglutide -Weight Management (SAXENDA) 18 MG/3ML SOPN; Inject 3 mg into the skin daily. Please include ultra fine needles 78mm  Essential hypertension, benign -     lisinopril (PRINIVIL,ZESTRIL) 2.5 MG tablet; Take 1 tablet (2.5 mg total) by mouth daily.  Anxiety state -     citalopram (CELEXA) 20 MG tablet; Take 1 tablet (20 mg total) by mouth daily.  Gastroesophageal reflux disease with esophagitis -     esomeprazole (NEXIUM) 40 MG capsule; TAKE 1 CAPSULE (40 MG TOTAL) BY MOUTH DAILY BEFORE  BREAKFAST.      .. Depression screen [facility] 2/9 04/07/2017  Decreased Interest 0  Down, Depressed, Hopeless 0  PHQ - 2 Score 0   .. GAD 7 : Generalized Anxiety Score 04/07/2017  Nervous, Anxious, on Edge 0  Control/stop worrying 1  Worry too much - different things 1  Trouble relaxing 0  Restless 0  Easily annoyed or irritable 0  Afraid - awful might happen 0  Total GAD 7 Score 2  Anxiety Difficulty Not difficult at all    Continue on saxenda. Great job.  Marland Kitchen.Discussed low carb diet with 1500 calories and 80g of protein.  Exercising at least 150 minutes a week.  [employer] could be a Microbiologist.  Follow up in 2 months.   Stop lisinopril/HCTZ and just stay on low dose ACE for kidney protecting. Hopefully BP will come up a little. Confirmed [name] is not taking verapamil for headaches and they are doing find.   Stay on half tablet of celexa for 2 months if still no change in anxiety lets either cut to 5mg  or stop.

## 2017-05-10 MED FILL — SAXENDA 18 MG/3 ML PEN: 18 | 30 days supply | Qty: 15 | Fill #1 | Status: TO

## 2017-05-17 DIAGNOSIS — G4733 Obstructive sleep apnea (adult) (pediatric): Secondary | ICD-10-CM | POA: Diagnosis not present

## 2017-06-16 ENCOUNTER — Encounter: Payer: Self-pay | Admitting: Physician Assistant

## 2017-06-16 ENCOUNTER — Ambulatory Visit (INDEPENDENT_AMBULATORY_CARE_PROVIDER_SITE_OTHER): Payer: 59 | Admitting: Physician Assistant

## 2017-06-16 VITALS — BP 108/57 | HR 85 | Ht 65.0 in | Wt 210.0 lb

## 2017-06-16 DIAGNOSIS — I341 Nonrheumatic mitral (valve) prolapse: Secondary | ICD-10-CM | POA: Diagnosis not present

## 2017-06-16 DIAGNOSIS — Z1159 Encounter for screening for other viral diseases: Secondary | ICD-10-CM | POA: Diagnosis not present

## 2017-06-16 DIAGNOSIS — E6609 Other obesity due to excess calories: Secondary | ICD-10-CM | POA: Diagnosis not present

## 2017-06-16 DIAGNOSIS — E039 Hypothyroidism, unspecified: Secondary | ICD-10-CM | POA: Diagnosis not present

## 2017-06-16 DIAGNOSIS — I10 Essential (primary) hypertension: Secondary | ICD-10-CM | POA: Diagnosis not present

## 2017-06-16 DIAGNOSIS — Z0000 Encounter for general adult medical examination without abnormal findings: Secondary | ICD-10-CM | POA: Diagnosis not present

## 2017-06-16 DIAGNOSIS — E7800 Pure hypercholesterolemia, unspecified: Secondary | ICD-10-CM | POA: Diagnosis not present

## 2017-06-16 DIAGNOSIS — Z114 Encounter for screening for human immunodeficiency virus [HIV]: Secondary | ICD-10-CM | POA: Diagnosis not present

## 2017-06-16 DIAGNOSIS — Z23 Encounter for immunization: Secondary | ICD-10-CM

## 2017-06-16 DIAGNOSIS — Z6834 Body mass index (BMI) 34.0-34.9, adult: Secondary | ICD-10-CM | POA: Diagnosis not present

## 2017-06-16 MED ORDER — HYDROCHLOROTHIAZIDE 12.5 MG PO CAPS: 12.5000 mg | ORAL_CAPSULE | Freq: Every day | ORAL | 1 refills | Status: DC

## 2017-06-16 MED ORDER — LORCASERIN HCL ER 20 MG PO TB24: 1.0000 | ORAL_TABLET | Freq: Every day | ORAL | 1 refills | Status: DC

## 2017-06-16 MED FILL — HYDROCHLOROTHIAZIDE 12.5 MG: 12.5 | 90 days supply | Qty: 90 | Fill #0

## 2017-06-16 NOTE — Progress Notes (Signed)
Subjective:     [name] is a 61 y.o. female and is here for a comprehensive physical exam. The patient reports problems - she had been on saxenda losing weight but then started having site reaction. she would like to know if there is anything else she can use to lose weight. .  Social History   Socioeconomic History  . Marital status: Married    Spouse name: [name]  . Number of children: 4  . Years of education: 14+  . Highest education level: Not on file  Social Needs  . Financial resource strain: Not on file  . Food insecurity - worry: Not on file  . Food insecurity - inability: Not on file  . Transportation needs - medical: Not on file  . Transportation needs - non-medical: Not on file  Occupational History    Employer: [employer]  Tobacco Use  . Smoking status: Former Smoker    Last attempt to quit: 11/22/1983    Years since quitting: 33.5  . Smokeless tobacco: Never Used  Substance and Sexual Activity  . Alcohol use: Yes    Comment: occasional  . Drug use: No  . Sexual activity: Yes    Partners: Male  Other Topics Concern  . Not on file  Social History Narrative   Marital Status:  Married Marine scientist)   Children: 4   Pets:  Dogs (2)   Living Situation: Lives with spouse, son and his family.     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[employer]     Education:  Forensic psychologist; MSN    Tobacco Use/Exposure: None   Alcohol Use: Rarely   Drug Use:  None   Diet:  Low Fat    Exercise:  Limited (Walking Dogs)    Hobbies:  Reading, Movies             Health Maintenance  Topic Date Due  . Hepatitis C Screening  Nov 04, 1955  . HIV Screening  01/20/1970  . PAP SMEAR  01/21/1976  . MAMMOGRAM  10/22/2017  . TETANUS/TDAP  09/27/2021  . COLONOSCOPY  05/13/2023  . INFLUENZA VACCINE  Completed    The following portions of the patient's history were reviewed and updated as appropriate: allergies, current medications, past family history, past medical history,  past social history, past surgical history and problem list.  Review of Systems Pertinent items noted in HPI and remainder of comprehensive ROS otherwise negative.   Objective:    BP (!) 108/57   Pulse 85   Ht 5\' 5"  (1.651 m)   Wt 210 lb (95.3 kg)   BMI 34.95 kg/m  General appearance: alert, cooperative, appears stated age and moderately obese Head: Normocephalic, without obvious abnormality, atraumatic Eyes: conjunctivae/corneas clear. PERRL, EOM's intact. Fundi benign. Ears: normal TM's and external ear canals both ears Nose: Nares normal. Septum midline. Mucosa normal. No drainage or sinus tenderness. Throat: lips, mucosa, and tongue normal; teeth and gums normal Neck: no adenopathy, no carotid bruit, no JVD, supple, symmetrical, trachea midline and thyroid not enlarged, symmetric, no tenderness/mass/nodules Back: symmetric, no curvature. ROM normal. No CVA tenderness. Lungs: clear to auscultation bilaterally Heart: normal apical impulse, regular rate and rhythm, S1, S2 normal and systolic murmur: late systolic 2/6, blowing at 2nd left intercostal space Abdomen: soft, non-tender; bowel sounds normal; no masses,  no organomegaly Extremities: extremities normal, atraumatic, no cyanosis or edema Pulses: 2+ and symmetric Skin: Skin color, texture, turgor normal. No rashes or lesions Lymph nodes: Cervical, supraclavicular, and  axillary nodes normal. Neurologic: Alert and oriented X 3, normal strength and tone. Normal symmetric reflexes. Normal coordination and gait    Assessment:    Healthy female exam.      Plan:    .[name] was seen today for annual exam.  Diagnoses and all orders for this visit:  Routine physical examination -     Lipid Panel w/reflex Direct LDL -     COMPLETE METABOLIC PANEL WITH GFR -     CBC -     TSH -     Hepatitis C Antibody -     HIV antibody (with reflex)  Essential hypertension, benign -     hydrochlorothiazide (MICROZIDE) 12.5 MG capsule;  Take 1 capsule (12.5 mg total) daily by mouth. -     COMPLETE METABOLIC PANEL WITH GFR  MVP (mitral valve prolapse)  Hypothyroidism, unspecified type  Pure hypercholesterolemia -     Lipid Panel w/reflex Direct LDL  Need for hepatitis C screening test -     Hepatitis C Antibody  Encounter for screening for human immunodeficiency virus (HIV) -     HIV antibody (with reflex)  Need for shingles vaccine -     Varicella-zoster vaccine IM (Shingrix)  Class 1 obesity due to excess calories without serious comorbidity with body mass index (BMI) of 34.0 to 34.9 in adult -     Lorcaserin HCl ER (BELVIQ XR) 20 MG TB24; Take 1 tablet daily by mouth.   .. Depression screen [facility] 2/9 06/17/2017 04/07/2017  Decreased Interest 0 0  Down, Depressed, Hopeless 0 0  PHQ - 2 Score 0 0   .Marland Kitchen Discussed 150 minutes of exercise a week.  Encouraged vitamin D 1000 units and Calcium 1300mg  or 4 servings of dairy a day.   .Discussed low carb diet with 1500 calories and 80g of protein.  Exercising at least 150 minutes a week.  My Fitness Pal could be a Microbiologist.   Mammogram/colonoscopy up to date Pt declined pap smear today.   Discussed weight today.  Patient did not tolerate Saxenda.  Will start Belviq.  Discussed side effects.  Follow-up in 2 months.   See After Visit Summary for Counseling Recommendations

## 2017-06-16 NOTE — Patient Instructions (Signed)

## 2017-06-24 ENCOUNTER — Telehealth: Payer: Self-pay | Admitting: Physician Assistant

## 2017-06-24 NOTE — Telephone Encounter (Signed)
Received fax for PA on Belviq sent through cover my meds waiting on determination. - CF

## 2017-07-06 MED FILL — BELVIQ XR 20 MG TABLET: 20 | 30 days supply | Qty: 30 | Fill #0

## 2017-07-06 NOTE — Telephone Encounter (Signed)
belviq has been approved through insurance from 07/02/2017 to 10/01/2017.approval faxed to pharmacy

## 2017-07-12 DIAGNOSIS — Z0000 Encounter for general adult medical examination without abnormal findings: Secondary | ICD-10-CM | POA: Diagnosis not present

## 2017-07-12 DIAGNOSIS — Z1159 Encounter for screening for other viral diseases: Secondary | ICD-10-CM | POA: Diagnosis not present

## 2017-07-12 DIAGNOSIS — E7800 Pure hypercholesterolemia, unspecified: Secondary | ICD-10-CM | POA: Diagnosis not present

## 2017-07-12 DIAGNOSIS — Z114 Encounter for screening for human immunodeficiency virus [HIV]: Secondary | ICD-10-CM | POA: Diagnosis not present

## 2017-07-12 DIAGNOSIS — I10 Essential (primary) hypertension: Secondary | ICD-10-CM | POA: Diagnosis not present

## 2017-07-14 ENCOUNTER — Encounter: Payer: Self-pay | Admitting: Physician Assistant

## 2017-07-14 DIAGNOSIS — R7301 Impaired fasting glucose: Secondary | ICD-10-CM | POA: Insufficient documentation

## 2017-07-14 DIAGNOSIS — E781 Pure hyperglyceridemia: Secondary | ICD-10-CM | POA: Insufficient documentation

## 2017-07-14 NOTE — Progress Notes (Signed)
Call pt: LDL is great. TG are elevated. Need to be on fish oil 4000mg  daily OTC. Watch sugars and carbs.  Thyroid looks good.  Elevated fasting glucose. Add a1c.  Kidney function stable.

## 2017-07-17 LAB — COMPLETE METABOLIC PANEL WITH GFR
AG Ratio: 2 (calc) (ref 1.0–2.5)
ALBUMIN MSPROF: 3.8 g/dL (ref 3.6–5.1)
ALT: 25 U/L (ref 6–29)
AST: 20 U/L (ref 10–35)
Alkaline phosphatase (APISO): 68 U/L (ref 33–130)
BUN / CREAT RATIO: 17 (calc) (ref 6–22)
BUN: 18 mg/dL (ref 7–25)
CALCIUM: 9.5 mg/dL (ref 8.6–10.4)
CO2: 28 mmol/L (ref 20–32)
CREATININE: 1.09 mg/dL — AB (ref 0.50–0.99)
Chloride: 105 mmol/L (ref 98–110)
GFR, EST AFRICAN AMERICAN: 63 mL/min/{1.73_m2} (ref >=60)
GFR, Est Non African American: 54 mL/min/{1.73_m2} — ABNORMAL LOW (ref >=60)
GLUCOSE: 107 mg/dL — AB (ref 65–99)
Globulin: 1.9 g/dL (calc) (ref 1.9–3.7)
Potassium: 4.6 mmol/L (ref 3.5–5.3)
Sodium: 140 mmol/L (ref 135–146)
TOTAL PROTEIN: 5.7 g/dL — AB (ref 6.1–8.1)
Total Bilirubin: 0.6 mg/dL (ref 0.2–1.2)

## 2017-07-17 LAB — CBC
HEMATOCRIT: 40.6 % (ref 35.0–45.0)
HEMOGLOBIN: 13.3 g/dL (ref 11.7–15.5)
MCH: 26.7 pg — AB (ref 27.0–33.0)
MCHC: 32.8 g/dL (ref 32.0–36.0)
MCV: 81.5 fL (ref 80.0–100.0)
MPV: 10.3 fL (ref 7.5–12.5)
Platelets: 340 10*3/uL (ref 140–400)
RBC: 4.98 10*6/uL (ref 3.80–5.10)
RDW: 13.6 % (ref 11.0–15.0)
WBC: 5.3 10*3/uL (ref 3.8–10.8)

## 2017-07-17 LAB — HEPATITIS C ANTIBODY
HEP C AB: NONREACTIVE
SIGNAL TO CUT-OFF: 0.01 (ref <1.00)

## 2017-07-17 LAB — TSH: TSH: 0.56 mIU/L (ref 0.40–4.50)

## 2017-07-17 LAB — LIPID PANEL W/REFLEX DIRECT LDL
Cholesterol: 174 mg/dL (ref <200)
HDL: 53 mg/dL (ref >50)
LDL Cholesterol (Calc): 88 mg/dL (calc)
Non-HDL Cholesterol (Calc): 121 mg/dL (calc) (ref <130)
Total CHOL/HDL Ratio: 3.3 (calc) (ref <5.0)
Triglycerides: 239 mg/dL — ABNORMAL HIGH (ref <150)

## 2017-07-17 LAB — HIV ANTIBODY (ROUTINE TESTING W REFLEX): HIV 1&2 Ab, 4th Generation: NONREACTIVE

## 2017-07-17 LAB — HEMOGLOBIN A1C W/OUT EAG: Hgb A1c MFr Bld: 5.7 % of total Hgb — ABNORMAL HIGH (ref <5.7)

## 2017-07-19 MED FILL — LISINOPRIL 2.5 MG TABLET: 2.5 | 90 days supply | Qty: 90 | Fill #1

## 2017-07-19 MED FILL — CITALOPRAM HBR 20 MG TABLET: 20 | 90 days supply | Qty: 90 | Fill #1

## 2017-07-19 MED FILL — ESOMEPRAZOLE MAG DR 40 MG C: 40 | 90 days supply | Qty: 90 | Fill #1

## 2017-08-10 HISTORY — PX: CARPAL TUNNEL RELEASE: SHX101

## 2017-08-25 ENCOUNTER — Encounter (INDEPENDENT_AMBULATORY_CARE_PROVIDER_SITE_OTHER): Payer: Self-pay | Admitting: Orthopaedic Surgery

## 2017-08-25 ENCOUNTER — Ambulatory Visit (INDEPENDENT_AMBULATORY_CARE_PROVIDER_SITE_OTHER): Payer: 59 | Admitting: Orthopaedic Surgery

## 2017-08-25 ENCOUNTER — Ambulatory Visit (INDEPENDENT_AMBULATORY_CARE_PROVIDER_SITE_OTHER): Payer: 59

## 2017-08-25 DIAGNOSIS — M25561 Pain in right knee: Secondary | ICD-10-CM

## 2017-08-25 DIAGNOSIS — G8929 Other chronic pain: Secondary | ICD-10-CM

## 2017-08-25 MED ORDER — METHYLPREDNISOLONE ACETATE 40 MG/ML IJ SUSP
40.0000 mg | INTRAMUSCULAR | Status: AC | PRN
  Administered 2017-08-25: 40 mg via INTRA_ARTICULAR

## 2017-08-25 MED ORDER — LIDOCAINE HCL 1 % IJ SOLN
3.0000 mL | INTRAMUSCULAR | Status: AC | PRN
  Administered 2017-08-25: 3 mL

## 2017-08-25 NOTE — Progress Notes (Signed)
Office Visit Note   Patient: [name]           Date of Birth: May 31, 1956           MRN: [id number] Visit Date: 08/25/2017              Requested by: Donella Stade, PA-C [address] [place], [place] [id number]  PCP: Donella Stade, PA-C   Assessment & Plan: Visit Diagnoses:  1. Chronic pain of right knee     Plan: Given her mechanical symptoms of locking catching of the right knee combined with medial joint line tenderness and a positive McMurray sign of medial compartment an MRI is warranted to rule out a meniscal tear and to assess the cartilage in general.  In order to temporize her pain for now we will provide a steroid injection in her knee today and she is agreeable to this.  We will see her back in 2 weeks to go over the MRI of her right knee.  All questions concerns were answered and addressed.  Follow-Up Instructions: Return in about 2 weeks (around 09/08/2017).   Orders:  Orders Placed This Encounter  Procedures  . Large Joint Inj: R knee  . XR Knee 1-2 Views Right   No orders of the defined types were placed in this encounter.     Procedures: Large Joint Inj: R knee on 08/25/2017 4:24 PM Indications: diagnostic evaluation and pain Details: 22 G 1.5 in needle, superolateral approach  Arthrogram: No  Medications: 3 mL lidocaine 1 %; 40 mg methylPREDNISolone acetate 40 MG/ML Outcome: tolerated well, no immediate complications Procedure, treatment alternatives, risks and benefits explained, specific risks discussed. Consent was given by the patient. Immediately prior to procedure a time out was called to verify the correct patient, procedure, equipment, support staff and site/side marked as required. Patient was prepped and draped in the usual sterile fashion.       Clinical Data: No additional findings.   Subjective: Chief Complaint  Patient presents with  . Right Knee - Pain  . Left Shoulder - Pain  Veneda comes in today for  evaluation mainly of her right knee.  She has had some history of left shoulder pain but is talked to another physician in town about this.  Mainly it is her right knee.  She actually has had a total knee arthroplasty that we performed about 4 years ago on her left knee and she has no complaints with that knee.  She points to the right knee medial joint line source of her pain.  She has episodes where the knee pops quite a bit and it locks up on her as well.  He can become debilitating at times and stop her from what she is doing.  She occasionally will swell on her as well.    HPI  Review of Systems She currently denies any headache, chest pain, shortness of breath, fever, chills, nausea, vomiting  Objective: Vital Signs: There were no vitals taken for this visit.  Physical Exam She is alert and oriented x3 and in no acute distress Ortho Exam Examination of her left knee appears normal.  Examination of her right knee shows significant patellofemoral crepitation.  She has significant medial joint line tenderness and a positive McMurray sign to the medial compartment of her right knee.  Her Lachman's exam is negative.  She has full range of motion of her right knee. Specialty Comments:  No specialty comments available.  Imaging: Xr Knee 1-2 Views Right  Result Date: 08/25/2017 2 views of the right knee shows slight varus malalignment.  There is significant periarticular osteophytes in all 3 compartments.  Most of the severe arthritic changes are at the patellofemoral joint.    PMFS History: Patient Active Problem List   Diagnosis Date Noted  . Hypertriglyceridemia 07/14/2017  . Elevated fasting glucose 07/14/2017  . Status post carpal tunnel release 03/11/2017  . Carpal tunnel syndrome, left upper limb 02/25/2017  . Daytime sleepiness 01/24/2017  . OSA (obstructive sleep apnea) 12/23/2015  . Hyperlipidemia 11/22/2014  . Single kidney 11/22/2014  . Hypothyroidism 11/22/2014  .  Nausea alone 11/18/2013  . Abnormal transaminases 11/18/2013  . Abdominal pain, chronic, right upper quadrant 11/16/2013  . Candidiasis of female genitalia 11/16/2013  . Elevated liver enzymes 11/16/2013  . Folliculitis 82/50/5397  . Arthritis of left knee 09/14/2013  . Status post total knee replacement 09/14/2013  . Cough 08/15/2013  . Obesity (BMI 30-39.9) 07/17/2013  . Essential hypertension, benign 04/29/2013  . Wheezing 04/29/2013  . Insomnia 04/29/2013  . Unspecified vitamin D deficiency 04/29/2013  . Migraine headache 04/29/2013  . Anxiety state 04/29/2013  . Unspecified hypothyroidism 04/29/2013  . UTI (urinary tract infection) 07/14/2012  . Other and unspecified hyperlipidemia 07/14/2012  . MVP (mitral valve prolapse) 07/14/2012  . GERD (gastroesophageal reflux disease) 07/14/2012  . Allergic rhinitis 07/14/2012  . Mitral regurgitation 07/14/2012  . S/P hysterectomy 07/14/2012   Past Medical History:  Diagnosis Date  . Allergy   . Anxiety   . Aortic insufficiency    Mild  . Arthritis   . Carpal tunnel syndrome   . Dysrhythmia    pvc's at times  . GERD (gastroesophageal reflux disease)   . Hyperlipidemia   . Hypothyroidism   . Migraine headache   . Mitral regurgitation   . Mitral valve prolapse   . Nausea alone   . Need for SBE (subacute bacterial endocarditis) prophylaxis   . Pilonidal cyst without mention of abscess   . Pneumonia Aug 14 2012  . PONV (postoperative nausea and vomiting)   . Sleep apnea    wears CPAP  . Thyroid disease   . Transplanted kidney removed    donated kidney    Family History  Problem Relation Age of Onset  . Diabetes Mother   . Hypertension Mother   . Heart disease Father   . Hypertension Father   . Thyroid disease Father   . Cancer Maternal Aunt   . Cancer Maternal Aunt   . Colon cancer Neg Hx   . Esophageal cancer Neg Hx   . Rectal cancer Neg Hx   . Stomach cancer Neg Hx     Past Surgical History:  Procedure  Laterality Date  . ABDOMINAL HYSTERECTOMY    . CARPAL TUNNEL RELEASE Right   . CARPAL TUNNEL RELEASE Left 02/25/2017   Procedure: LEFT OPEN CARPAL TUNNEL RELEASE;  Surgeon: [name], MD;  Location: [facility];  Service: Orthopedics;  Laterality: Left;  . COLONOSCOPY    . KNEE SURGERY    . NEPHRECTOMY    . TONSILLECTOMY    . TOTAL KNEE ARTHROPLASTY Left 09/14/2013   Procedure: LEFT TOTAL KNEE ARTHROPLASTY;  Surgeon: [name], MD;  Location: [facility];  Service: Orthopedics;  Laterality: Left;  . TUBAL LIGATION    . WISDOM TOOTH EXTRACTION     Social History   Occupational History    Employer: [employer]  Tobacco Use  .  Smoking status: Former Smoker    Last attempt to quit: 11/22/1983    Years since quitting: 33.7  . Smokeless tobacco: Never Used  Substance and Sexual Activity  . Alcohol use: Yes    Comment: occasional  . Drug use: No  . Sexual activity: Yes    Partners: Male

## 2017-08-26 ENCOUNTER — Other Ambulatory Visit (INDEPENDENT_AMBULATORY_CARE_PROVIDER_SITE_OTHER): Payer: Self-pay

## 2017-08-26 DIAGNOSIS — M25561 Pain in right knee: Principal | ICD-10-CM

## 2017-08-26 DIAGNOSIS — G8929 Other chronic pain: Secondary | ICD-10-CM

## 2017-09-05 ENCOUNTER — Ambulatory Visit
Admission: RE | Admit: 2017-09-05 | Discharge: 2017-09-05 | Disposition: A | Discharge status: Home / Self Care | Payer: 59 | Source: Ambulatory Visit | Attending: Orthopaedic Surgery | Admitting: Orthopaedic Surgery

## 2017-09-05 DIAGNOSIS — G8929 Other chronic pain: Secondary | ICD-10-CM

## 2017-09-05 DIAGNOSIS — M23003 Cystic meniscus, unspecified medial meniscus, right knee: Secondary | ICD-10-CM | POA: Diagnosis not present

## 2017-09-05 DIAGNOSIS — M25561 Pain in right knee: Principal | ICD-10-CM

## 2017-09-13 ENCOUNTER — Ambulatory Visit (INDEPENDENT_AMBULATORY_CARE_PROVIDER_SITE_OTHER): Payer: 59 | Admitting: Orthopaedic Surgery

## 2017-09-13 ENCOUNTER — Encounter (INDEPENDENT_AMBULATORY_CARE_PROVIDER_SITE_OTHER): Payer: Self-pay | Admitting: Orthopaedic Surgery

## 2017-09-13 DIAGNOSIS — M25561 Pain in right knee: Secondary | ICD-10-CM

## 2017-09-13 DIAGNOSIS — G8929 Other chronic pain: Secondary | ICD-10-CM | POA: Diagnosis not present

## 2017-09-13 NOTE — Progress Notes (Signed)
[name] is well-known to [place].  She is following up after having an MRI of her right knee.  Placed a steroid injection in that knee about 2 and half weeks ago.  She is also had a history of a left knee replacement.  Said the steroid injection has helped.  Again this was in her right knee.  On examination her right knee there is no significant pain other than a little bit on the medial joint line.  The MRI does show slightly worsening radial tear of the posterior horn the meniscus that is slightly worsened since the previous MRI in 2013.  She does have thinning of the cartilage that her knee but no areas of full-thickness cartilage loss.  At this point I do feel that she is a good candidate for hyaluronic acid for her right knee given just a mild arthritic findings.  I would not pursue an arthroscopic intervention unless her medial meniscal tear became symptomatic in terms of locking and catching which is not.  We will see her back in 2 weeks and place hyaluronic acid injection into her right knee.  I talked her about this in length and she agrees with this route of treatment.

## 2017-09-14 ENCOUNTER — Telehealth (INDEPENDENT_AMBULATORY_CARE_PROVIDER_SITE_OTHER): Payer: Self-pay

## 2017-09-14 NOTE — Telephone Encounter (Signed)
Submitted Immunologist.  Awaiting Response.

## 2017-09-16 ENCOUNTER — Telehealth (INDEPENDENT_AMBULATORY_CARE_PROVIDER_SITE_OTHER): Payer: Self-pay

## 2017-09-16 NOTE — Telephone Encounter (Signed)
Called and left VM advising patient that Monovisc has been approved for right knee.  Patient will have one co-pay of $60.00 and we can buy and bill.

## 2017-09-17 DIAGNOSIS — H52223 Regular astigmatism, bilateral: Secondary | ICD-10-CM | POA: Diagnosis not present

## 2017-09-17 DIAGNOSIS — H524 Presbyopia: Secondary | ICD-10-CM | POA: Diagnosis not present

## 2017-09-27 ENCOUNTER — Encounter (INDEPENDENT_AMBULATORY_CARE_PROVIDER_SITE_OTHER): Payer: Self-pay | Admitting: Orthopaedic Surgery

## 2017-09-27 ENCOUNTER — Ambulatory Visit (INDEPENDENT_AMBULATORY_CARE_PROVIDER_SITE_OTHER): Payer: 59 | Admitting: Orthopaedic Surgery

## 2017-09-27 DIAGNOSIS — M25561 Pain in right knee: Principal | ICD-10-CM

## 2017-09-27 DIAGNOSIS — G8929 Other chronic pain: Secondary | ICD-10-CM

## 2017-09-27 DIAGNOSIS — M1711 Unilateral primary osteoarthritis, right knee: Secondary | ICD-10-CM | POA: Insufficient documentation

## 2017-09-27 MED ORDER — HYALURONAN 88 MG/4ML IX SOSY
88.0000 mg | PREFILLED_SYRINGE | INTRA_ARTICULAR | Status: AC | PRN
  Administered 2017-09-27: 88 mg via INTRA_ARTICULAR

## 2017-09-27 MED FILL — HYDROCHLOROTHIAZIDE 12.5 MG: 12.5 | 90 days supply | Qty: 90 | Fill #1

## 2017-09-27 NOTE — Progress Notes (Signed)
   Procedure Note  Patient: ASHELYNN MARKS             Date of Birth: 12-08-1955           MRN: [id number]             Visit Date: 09/27/2017  Procedures: Visit Diagnoses: Chronic pain of right knee  Unilateral primary osteoarthritis, right knee  Large Joint Inj: R knee on 09/27/2017 2:20 PM Indications: pain and diagnostic evaluation Details: 22 G 1.5 in needle, superolateral approach  Arthrogram: No  Medications: 88 mg Hyaluronan 88 MG/4ML Outcome: tolerated well, no immediate complications Procedure, treatment alternatives, risks and benefits explained, specific risks discussed. Consent was given by the patient. Immediately prior to procedure a time out was called to verify the correct patient, procedure, equipment, support staff and site/side marked as required. Patient was prepped and draped in the usual sterile fashion.    Farrell is here for scheduled hyaluronic acid injection in her right knee to treat pain and moderate arthritis.  She has had a left total knee arthroplasty before.  Her right knee is not a severe in terms of arthritis.  She tolerated steroid injection well and now is time to have a Monovisc injection.  She understands the reasoning behind this and the risk and benefits involved.  She tolerated injection well.  She will follow-up as needed.  She understands that we can always place a steroid injection down the road again if needed.  All questions concerns were answered and addressed.

## 2017-10-19 ENCOUNTER — Ambulatory Visit (INDEPENDENT_AMBULATORY_CARE_PROVIDER_SITE_OTHER): Payer: 59 | Admitting: Physician Assistant

## 2017-10-19 ENCOUNTER — Encounter: Payer: Self-pay | Admitting: Physician Assistant

## 2017-10-19 VITALS — BP 120/49 | HR 62 | Ht 65.0 in | Wt 221.0 lb

## 2017-10-19 DIAGNOSIS — E782 Mixed hyperlipidemia: Secondary | ICD-10-CM

## 2017-10-19 DIAGNOSIS — M25541 Pain in joints of right hand: Secondary | ICD-10-CM

## 2017-10-19 DIAGNOSIS — E039 Hypothyroidism, unspecified: Secondary | ICD-10-CM

## 2017-10-19 DIAGNOSIS — E6609 Other obesity due to excess calories: Secondary | ICD-10-CM | POA: Insufficient documentation

## 2017-10-19 DIAGNOSIS — Z6836 Body mass index (BMI) 36.0-36.9, adult: Secondary | ICD-10-CM

## 2017-10-19 DIAGNOSIS — R400 Somnolence: Secondary | ICD-10-CM | POA: Diagnosis not present

## 2017-10-19 DIAGNOSIS — I10 Essential (primary) hypertension: Secondary | ICD-10-CM | POA: Diagnosis not present

## 2017-10-19 DIAGNOSIS — R7303 Prediabetes: Secondary | ICD-10-CM | POA: Diagnosis not present

## 2017-10-19 DIAGNOSIS — E781 Pure hyperglyceridemia: Secondary | ICD-10-CM

## 2017-10-19 DIAGNOSIS — Z6837 Body mass index (BMI) 37.0-37.9, adult: Secondary | ICD-10-CM

## 2017-10-19 DIAGNOSIS — F411 Generalized anxiety disorder: Secondary | ICD-10-CM | POA: Diagnosis not present

## 2017-10-19 DIAGNOSIS — M79644 Pain in right finger(s): Secondary | ICD-10-CM | POA: Diagnosis not present

## 2017-10-19 DIAGNOSIS — K2100 Gastro-esophageal reflux disease with esophagitis, without bleeding: Secondary | ICD-10-CM

## 2017-10-19 MED ORDER — ARMODAFINIL 150 MG PO TABS: 150.0000 mg | ORAL_TABLET | Freq: Every day | ORAL | 1 refills | Status: DC

## 2017-10-19 MED ORDER — ESOMEPRAZOLE MAGNESIUM 40 MG PO CPDR: DELAYED_RELEASE_CAPSULE | ORAL | 3 refills | Status: DC

## 2017-10-19 MED ORDER — LEVOTHYROXINE SODIUM 137 MCG PO TABS: 137.0000 ug | ORAL_TABLET | Freq: Every day | ORAL | 11 refills | Status: DC

## 2017-10-19 MED ORDER — CITALOPRAM HYDROBROMIDE 20 MG PO TABS: 20.0000 mg | ORAL_TABLET | Freq: Every day | ORAL | 3 refills | Status: DC

## 2017-10-19 MED ORDER — HYDROCHLOROTHIAZIDE 12.5 MG PO CAPS: 12.5000 mg | ORAL_CAPSULE | Freq: Every day | ORAL | 3 refills | Status: DC

## 2017-10-19 MED ORDER — LISINOPRIL 2.5 MG PO TABS: 2.5000 mg | ORAL_TABLET | Freq: Every day | ORAL | 3 refills | Status: DC

## 2017-10-19 MED ORDER — ROSUVASTATIN CALCIUM 10 MG PO TABS: 10.0000 mg | ORAL_TABLET | Freq: Every day | ORAL | 3 refills | Status: DC

## 2017-10-19 MED FILL — ROSUVASTATIN CALCIUM 10 MG: 10 | 90 days supply | Qty: 90 | Fill #0

## 2017-10-19 MED FILL — LEVOTHYROXINE 137 MCG TAB: 137 | 30 days supply | Qty: 30 | Fill #0

## 2017-10-19 MED FILL — CITALOPRAM HBR 20 MG TABLET: 20 | 90 days supply | Qty: 90 | Fill #0

## 2017-10-19 MED FILL — ESOMEPRAZOLE MAG DR 40 MG C: 40 | 90 days supply | Qty: 90 | Fill #0

## 2017-10-19 MED FILL — LISINOPRIL 2.5 MG TABLET: 2.5 | 90 days supply | Qty: 90 | Fill #0

## 2017-10-19 NOTE — Progress Notes (Signed)
Subjective:     Patient ID: [name], female   DOB: 1955-10-08, 62 y.o.   MRN: [id number]  HPI   Ms. Briel is a 62 yo female with multiple medical history including obesity, anxiety, GERD, aortic insufficiency, arthritis, hyperlipidemia presents to the office for some of her medication refills.   Patient also wants to discuss about right thumb pain during this visit. Her symptoms started about a month ago. She point her pain at right first metacarpophalangeal joint.  Initially she felt sore at the joint which gradually worsen in last two weeks. She also started popping feeling at the joint in the morning. Her pain is stable 1-2/10 but can be up to 8-9/10 during addition and hyperextension of the thumb. She also feels pain at the joint with flexion of first DIP. Patient denies any injury or repetitive movement contributing to her symptoms. Patient denies numbness, tingling of the thumb.   Patient tried belviq for 4 months after her last visit. Patient states that she was not able to loose any weight and stopped taking it. Patient is on weight watchers program for last 3 weeks. Patient reports loosing about 10 lbs in 2 weeks. She feels motivated and optimistic about the program. She states that she feels different by loosing just 10 lbs, and will continue the program. Patient walks about 3 miles at work every day, uses stationary bike few times a week, and walk her dog daily.   Patient denies fever, chills, night sweats. Patient feels occasional palpitation, but denies chest pain, SOB, cough, wheezing, increased mucus production, hemoptysis, nausea, vomiting, diarrhea, constipation. No numbness, tingling, weakness, syncope. Patient denies feeling down, depression, suicidal ideation, anxious, irritable. Reports stable mood.  Active Ambulatory Problems    Diagnosis Date Noted  . UTI (urinary tract infection) 07/14/2012  . Other and unspecified hyperlipidemia 07/14/2012  . MVP (mitral valve  prolapse) 07/14/2012  . GERD (gastroesophageal reflux disease) 07/14/2012  . Allergic rhinitis 07/14/2012  . Mitral regurgitation 07/14/2012  . S/P hysterectomy 07/14/2012  . Essential hypertension, benign 04/29/2013  . Wheezing 04/29/2013  . Insomnia 04/29/2013  . Unspecified vitamin D deficiency 04/29/2013  . Migraine headache 04/29/2013  . Anxiety state 04/29/2013  . Unspecified hypothyroidism 04/29/2013  . Obesity (BMI 30-39.9) 07/17/2013  . Cough 08/15/2013  . Arthritis of left knee 09/14/2013  . Status post total knee replacement 09/14/2013  . Abdominal pain, chronic, right upper quadrant 11/16/2013  . Candidiasis of female genitalia 11/16/2013  . Elevated liver enzymes 11/16/2013  . Folliculitis 32/20/2542  . Nausea alone 11/18/2013  . Abnormal transaminases 11/18/2013  . Hyperlipidemia 11/22/2014  . Single kidney 11/22/2014  . Hypothyroidism 11/22/2014  . OSA (obstructive sleep apnea) 12/23/2015  . Daytime sleepiness 01/24/2017  . Carpal tunnel syndrome, left upper limb 02/25/2017  . Status post carpal tunnel release 03/11/2017  . Hypertriglyceridemia 07/14/2017  . Elevated fasting glucose 07/14/2017  . Chronic pain of right knee 09/13/2017  . Unilateral primary osteoarthritis, right knee 09/27/2017  . Pre-diabetes 10/19/2017  . Pain in thumb joint with movement of right hand 10/19/2017  . Class 2 obesity due to excess calories without serious comorbidity with body mass index (BMI) of 36.0 to 36.9 in adult 10/19/2017   Resolved Ambulatory Problems    Diagnosis Date Noted  . Hypothyroidism 07/14/2012   Past Medical History:  Diagnosis Date  . Allergy   . Anxiety   . Aortic insufficiency   . Arthritis   . Carpal tunnel syndrome   .  Dysrhythmia   . GERD (gastroesophageal reflux disease)   . Hyperlipidemia   . Hypothyroidism   . Migraine headache   . Mitral regurgitation   . Mitral valve prolapse   . Nausea alone   . Need for SBE (subacute bacterial  endocarditis) prophylaxis   . Pilonidal cyst without mention of abscess   . Pneumonia Aug 14 2012  . PONV (postoperative nausea and vomiting)   . Sleep apnea   . Thyroid disease   . Transplanted kidney removed     Review of Systems   As stated in HPI     Objective:   Physical Exam  Constitutional: She appears well-developed and well-nourished. No distress.  HENT:  Head: Normocephalic and atraumatic.  Nose: Nose normal.  Eyes: Conjunctivae and EOM are normal. Pupils are equal, round, and reactive to light. Right eye exhibits no discharge. Left eye exhibits no discharge. No scleral icterus.  Neck: Normal range of motion. Neck supple. No tracheal deviation present. No thyromegaly present.  Cardiovascular: Normal rate, regular rhythm, normal heart sounds and intact distal pulses. Exam reveals no gallop and no friction rub.  Grade III holoystolic murmur best heard at left sternal border.   Pulmonary/Chest: Effort normal and breath sounds normal. No respiratory distress. She has no wheezes. She has no rales. She exhibits no tenderness.  Musculoskeletal: She exhibits no edema, tenderness or deformity.  Moderate pain upon at right first metacarpophalangeal joint upon thumb addition and hyperextention. Negative finkelstein test.  Lymphadenopathy:    She has no cervical adenopathy.      Assessment:     Marland KitchenMarland KitchenMamye was seen today for medication refill.  Diagnoses and all orders for this visit:  Pain in thumb joint with movement of right hand  Anxiety state -     citalopram (CELEXA) 20 MG tablet; Take 1 tablet (20 mg total) by mouth daily.  Essential hypertension, benign -     hydrochlorothiazide (MICROZIDE) 12.5 MG capsule; Take 1 capsule (12.5 mg total) by mouth daily. -     lisinopril (PRINIVIL,ZESTRIL) 2.5 MG tablet; Take 1 tablet (2.5 mg total) by mouth daily.  Gastroesophageal reflux disease with esophagitis -     esomeprazole (NEXIUM) 40 MG capsule; TAKE 1 CAPSULE (40 MG TOTAL)  BY MOUTH DAILY BEFORE BREAKFAST.  Daytime sleepiness -     Armodafinil 150 MG tablet; Take 1 tablet (150 mg total) by mouth daily.  Pre-diabetes  Hypothyroidism, unspecified type -     levothyroxine (SYNTHROID, LEVOTHROID) 137 MCG tablet; Take 1 tablet (137 mcg total) by mouth daily before breakfast.  Mixed hyperlipidemia -     rosuvastatin (CRESTOR) 10 MG tablet; Take 1 tablet (10 mg total) by mouth daily.  Hypertriglyceridemia -     rosuvastatin (CRESTOR) 10 MG tablet; Take 1 tablet (10 mg total) by mouth daily.  Class 2 obesity due to excess calories without serious comorbidity with body mass index (BMI) of 36.0 to 36.9 in adult      Plan:     New prescription of requested medications including rosuvastatin, armodafinil, citalopram, esomeprazole, lisinopril, and hydrochlorothiazide was sent to the pharmacy. Labs done 07/2017.  Patient reports loosing 10 lbs in 2 weeks while on a weight watchers program. Patient was encouraged to continue weight watcher program and working on her weight loss goal. She has previously tried saxenda and had side effects.  Marland Kitchen.Discussed low carb diet with 1500 calories and 80g of protein.  Exercising at least 150 minutes a week.  My Fitness Pal could  be a Microbiologist.     Thumb pain-  Likely related to osteoarthritis and/or tendonitis. Patient was suggested to try conservative measures such as activity limitation, ice,  thumb spica, and OTC anti inflammatory agents for symptoms relief. Further testing, referral to sports medicine physician if no relief in symptoms in 2 weeks.   Marland Kitchen.Spent 30 minutes with patient and greater than 50 percent of visit spent counseling patient regarding treatment plan.

## 2017-10-25 ENCOUNTER — Ambulatory Visit: Payer: 59

## 2017-11-15 MED FILL — LEVOTHYROXINE 137 MCG TAB: 137 | 30 days supply | Qty: 30 | Fill #1

## 2017-11-24 ENCOUNTER — Ambulatory Visit (INDEPENDENT_AMBULATORY_CARE_PROVIDER_SITE_OTHER): Payer: 59

## 2017-11-24 ENCOUNTER — Encounter (INDEPENDENT_AMBULATORY_CARE_PROVIDER_SITE_OTHER): Payer: Self-pay | Admitting: Orthopaedic Surgery

## 2017-11-24 ENCOUNTER — Ambulatory Visit (INDEPENDENT_AMBULATORY_CARE_PROVIDER_SITE_OTHER): Payer: 59 | Admitting: Orthopaedic Surgery

## 2017-11-24 VITALS — Ht 65.0 in | Wt 221.0 lb

## 2017-11-24 DIAGNOSIS — M65311 Trigger thumb, right thumb: Secondary | ICD-10-CM | POA: Diagnosis not present

## 2017-11-24 DIAGNOSIS — M79644 Pain in right finger(s): Secondary | ICD-10-CM | POA: Diagnosis not present

## 2017-11-24 DIAGNOSIS — M23321 Other meniscus derangements, posterior horn of medial meniscus, right knee: Secondary | ICD-10-CM | POA: Diagnosis not present

## 2017-11-24 MED ORDER — METHYLPREDNISOLONE ACETATE 40 MG/ML IJ SUSP
40.0000 mg | INTRAMUSCULAR | Status: AC | PRN
  Administered 2017-11-24: 40 mg

## 2017-11-24 MED ORDER — LIDOCAINE HCL 1 % IJ SOLN
1.0000 mL | INTRAMUSCULAR | Status: AC | PRN
  Administered 2017-11-24: 1 mL

## 2017-11-24 NOTE — Progress Notes (Signed)
Office Visit Note   Patient: [name]           Date of Birth: [birth year]           MRN: [id number] Visit Date: 11/24/2017              Requested by: Donella Stade, PA-C [place] [place], [place] [address] PCP: Donella Stade, PA-C   Assessment & Plan: Visit Diagnoses:  1. Pain of right thumb   2. Trigger thumb, right thumb   3. Other meniscus derangements, posterior horn of medial meniscus, right knee     Plan: She understands fully that I am recommending a steroid injection of the right thumb A1 pulley and she agrees with this and tolerated it well.  She understands that this may need a repeat injection in the near future.  As far as her knee goes next step would be an arthroscopic intervention and she agrees with this.  We talked about surgery in detail and explained the risk and benefits of this.  Given the failure of conservative treatment including activity modification as well as anti-inflammatories and quad strengthening exercises as well as steroid and hyaluronic acid injections and arthroscopic intervention will be the next step due to the mechanical symptoms and the known meniscal tear for the MRI.  All questions concerns were answered and addressed.  We will work on getting this scheduled in the near future.  We then see her back in 1 week postoperative.  Follow-Up Instructions: Return for 1 week post-op.   Orders:  Orders Placed This Encounter  Procedures  . Hand/UE Inj  . XR Finger Thumb Right   No orders of the defined types were placed in this encounter.     Procedures: Hand/UE Inj: R thumb A1 for trigger finger on 11/24/2017 5:22 PM Medications: 1 mL lidocaine 1 %; 40 mg methylPREDNISolone acetate 40 MG/ML      Clinical Data: No additional findings.   Subjective: Chief Complaint  Patient presents with  . Right Knee - Pain, Follow-up  . Right Thumb - Pain, Edema  [name] comes in today with acute right thumb pain and  triggering with no known injury.  This is been getting worse over the last several weeks.  The thumb is been triggering and she is been experiencing some swelling as well.  She denies any numbness and tingling.  We have also been seeing her for her right knee.  She is tried a steroid injection in that knee as well as a hyaluronic acid injection.  She still has mechanical symptoms of locking and catching and pain.  An MRI did show a meniscal root tear with thinning of the articular cartilage throughout the knee but no full-thickness cartilage deficits.  She said the knee is still quite bothersome for her.  HPI  Review of Systems She currently denies any headache, chest pain, shortness of breath, fever, chills, nausea, vomiting.  Objective: Vital Signs: Ht 5\' 5"  (1.651 m)   Wt 221 lb (100.2 kg)   BMI 36.78 kg/m   Physical Exam She is alert and oriented x3 and in no acute distress Ortho Exam Examination of right thumb shows significant pain over the A1 pulley and active triggering.  She does have swelling and pain along the IP joint of the thumb with normal sensation and normal perfusion.  There is a prominent osteophyte at the IP joint.  Examination of her right knee does show medial lateral  joint line tenderness with full range of motion in the knee is ligamentously stable.  However she does have significant pain with McMurray's to the medial compartment. Specialty Comments:  No specialty comments available.  Imaging: Xr Finger Thumb Right  Result Date: 11/24/2017 X-rays of the right thumb shows osteoarthritis of the thumb IP joint with periarticular osteophytes.    PMFS History: Patient Active Problem List   Diagnosis Date Noted  . Other meniscus derangements, posterior horn of medial meniscus, right knee 11/24/2017  . Pain of right thumb 11/24/2017  . Trigger thumb, right thumb 11/24/2017  . Pre-diabetes 10/19/2017  . Pain in thumb joint with movement of right hand 10/19/2017  .  Class 2 obesity due to excess calories without serious comorbidity with body mass index (BMI) of 36.0 to 36.9 in adult 10/19/2017  . Unilateral primary osteoarthritis, right knee 09/27/2017  . Chronic pain of right knee 09/13/2017  . Hypertriglyceridemia 07/14/2017  . Elevated fasting glucose 07/14/2017  . Status post carpal tunnel release 03/11/2017  . Carpal tunnel syndrome, left upper limb 02/25/2017  . Daytime sleepiness 01/24/2017  . OSA (obstructive sleep apnea) 12/23/2015  . Hyperlipidemia 11/22/2014  . Single kidney 11/22/2014  . Hypothyroidism 11/22/2014  . Nausea alone 11/18/2013  . Abnormal transaminases 11/18/2013  . Abdominal pain, chronic, right upper quadrant 11/16/2013  . Candidiasis of female genitalia 11/16/2013  . Elevated liver enzymes 11/16/2013  . Folliculitis 16/60/6301  . Arthritis of left knee 09/14/2013  . Status post total knee replacement 09/14/2013  . Cough 08/15/2013  . Obesity (BMI 30-39.9) 07/17/2013  . Essential hypertension, benign 04/29/2013  . Wheezing 04/29/2013  . Insomnia 04/29/2013  . Unspecified vitamin D deficiency 04/29/2013  . Migraine headache 04/29/2013  . Anxiety state 04/29/2013  . Unspecified hypothyroidism 04/29/2013  . UTI (urinary tract infection) 07/14/2012  . Other and unspecified hyperlipidemia 07/14/2012  . MVP (mitral valve prolapse) 07/14/2012  . GERD (gastroesophageal reflux disease) 07/14/2012  . Allergic rhinitis 07/14/2012  . Mitral regurgitation 07/14/2012  . S/P hysterectomy 07/14/2012   Past Medical History:  Diagnosis Date  . Allergy   . Anxiety   . Aortic insufficiency    Mild  . Arthritis   . Carpal tunnel syndrome   . Dysrhythmia    pvc's at times  . GERD (gastroesophageal reflux disease)   . Hyperlipidemia   . Hypothyroidism   . Migraine headache   . Mitral regurgitation   . Mitral valve prolapse   . Nausea alone   . Need for SBE (subacute bacterial endocarditis) prophylaxis   . Pilonidal cyst  without mention of abscess   . Pneumonia Aug 14 2012  . PONV (postoperative nausea and vomiting)   . Sleep apnea    wears CPAP  . Thyroid disease   . Transplanted kidney removed    donated kidney    Family History  Problem Relation Age of Onset  . Diabetes Mother   . Hypertension Mother   . Heart disease Father   . Hypertension Father   . Thyroid disease Father   . Cancer Maternal Aunt   . Cancer Maternal Aunt   . Colon cancer Neg Hx   . Esophageal cancer Neg Hx   . Rectal cancer Neg Hx   . Stomach cancer Neg Hx     Past Surgical History:  Procedure Laterality Date  . ABDOMINAL HYSTERECTOMY    . CARPAL TUNNEL RELEASE Right   . CARPAL TUNNEL RELEASE Left 02/25/2017   Procedure: LEFT OPEN CARPAL  TUNNEL RELEASE;  Surgeon: [name], MD;  Location: [facility];  Service: Orthopedics;  Laterality: Left;  . COLONOSCOPY    . KNEE SURGERY    . NEPHRECTOMY    . TONSILLECTOMY    . TOTAL KNEE ARTHROPLASTY Left 09/14/2013   Procedure: LEFT TOTAL KNEE ARTHROPLASTY;  Surgeon: [name], MD;  Location: [facility];  Service: Orthopedics;  Laterality: Left;  . TUBAL LIGATION    . WISDOM TOOTH EXTRACTION     Social History   Occupational History    Employer: [employer]  Tobacco Use  . Smoking status: Former Smoker    Last attempt to quit: 11/22/1983    Years since quitting: 34.0  . Smokeless tobacco: Never Used  Substance and Sexual Activity  . Alcohol use: Yes    Comment: occasional  . Drug use: No  . Sexual activity: Yes    Partners: Male

## 2017-12-13 ENCOUNTER — Ambulatory Visit (INDEPENDENT_AMBULATORY_CARE_PROVIDER_SITE_OTHER): Payer: 59 | Admitting: Physician Assistant

## 2017-12-13 VITALS — BP 114/56 | HR 70 | Temp 98.5°F | Resp 18

## 2017-12-13 DIAGNOSIS — Z23 Encounter for immunization: Secondary | ICD-10-CM | POA: Diagnosis not present

## 2017-12-13 NOTE — Progress Notes (Signed)
   Subjective:    Patient ID: [name], female    DOB: 29-Dec-1955, 62 y.o.   MRN: [id number]   HPI Patient here for second Shingrix in series of two; states had no adverse reaction to first except some soreness at site afterwards.    Review of Systems     Objective:   Physical Exam        Assessment & Plan:  Patient tolerated injection well without complications.  Agree with above plan. Iran Planas PA-C

## 2017-12-14 MED FILL — LEVOTHYROXINE 137 MCG TAB: 137 | 30 days supply | Qty: 30 | Fill #2

## 2017-12-14 MED FILL — HYDROCHLOROTHIAZIDE 12.5 MG: 12.5 | 90 days supply | Qty: 90 | Fill #0

## 2017-12-27 ENCOUNTER — Other Ambulatory Visit (INDEPENDENT_AMBULATORY_CARE_PROVIDER_SITE_OTHER): Payer: Self-pay | Admitting: Physician Assistant

## 2017-12-27 NOTE — Pre-Procedure Instructions (Signed)
    [name]  12/27/2017      [place], [place] - [place] [address] Phone: [phone] Fax: [phone]    Your procedure is scheduled on Thursday, May 30th   Report to [facility] Admitting at 8:00 AM             (posted surgery time 10a - 10:55a)   Call this number if you have problems the morning of surgery:  [phone]   Remember:              Nothing to eat or drink after midnight Wednesday, the 29th   Take these medicines the morning of surgery with A SIP OF WATER : Nexium, Synthroid, Tramadol.              4-5 days prior to surgery, STOP TAKING any Vitamins, Herbal Supplements, Anti-inflammatories.    Do not wear jewelry, make-up or nail polish.  Do not wear lotions, powders, perfumes, or deodorant.  Do not shave 48 hours prior to surgery.   Do not bring valuables to the hospital.  [facility] is not responsible for any belongings or valuables.  Contacts, dentures or bridgework may not be worn into surgery.  Leave your suitcase in the car.  After surgery it may be brought to your room.  For patients admitted to the hospital, discharge time will be determined by your treatment team.  Patients discharged the day of surgery will not be allowed to drive home, and will need someone to stay with you for the first 24 hrs.  Please read over the following fact sheets that you were given. Pain Booklet, MRSA Information and Surgical Site Infection Prevention

## 2017-12-28 ENCOUNTER — Encounter (HOSPITAL_COMMUNITY): Payer: Self-pay

## 2017-12-28 ENCOUNTER — Encounter (HOSPITAL_COMMUNITY)
Admission: RE | Admit: 2017-12-28 | Discharge: 2017-12-28 | Disposition: A | Discharge status: Home / Self Care | Payer: 59 | Source: Ambulatory Visit | Attending: Orthopaedic Surgery | Admitting: Orthopaedic Surgery

## 2017-12-28 ENCOUNTER — Other Ambulatory Visit: Payer: Self-pay

## 2017-12-28 DIAGNOSIS — Z79890 Hormone replacement therapy (postmenopausal): Secondary | ICD-10-CM | POA: Diagnosis not present

## 2017-12-28 DIAGNOSIS — X58XXXA Exposure to other specified factors, initial encounter: Secondary | ICD-10-CM | POA: Diagnosis not present

## 2017-12-28 DIAGNOSIS — Z87891 Personal history of nicotine dependence: Secondary | ICD-10-CM | POA: Insufficient documentation

## 2017-12-28 DIAGNOSIS — K219 Gastro-esophageal reflux disease without esophagitis: Secondary | ICD-10-CM | POA: Diagnosis not present

## 2017-12-28 DIAGNOSIS — M1990 Unspecified osteoarthritis, unspecified site: Secondary | ICD-10-CM | POA: Insufficient documentation

## 2017-12-28 DIAGNOSIS — S83241A Other tear of medial meniscus, current injury, right knee, initial encounter: Secondary | ICD-10-CM | POA: Diagnosis not present

## 2017-12-28 DIAGNOSIS — Z01812 Encounter for preprocedural laboratory examination: Secondary | ICD-10-CM | POA: Insufficient documentation

## 2017-12-28 DIAGNOSIS — F419 Anxiety disorder, unspecified: Secondary | ICD-10-CM | POA: Diagnosis not present

## 2017-12-28 DIAGNOSIS — Z905 Acquired absence of kidney: Secondary | ICD-10-CM | POA: Insufficient documentation

## 2017-12-28 DIAGNOSIS — Z01818 Encounter for other preprocedural examination: Secondary | ICD-10-CM | POA: Diagnosis not present

## 2017-12-28 DIAGNOSIS — Z96652 Presence of left artificial knee joint: Secondary | ICD-10-CM | POA: Diagnosis not present

## 2017-12-28 DIAGNOSIS — Z90710 Acquired absence of both cervix and uterus: Secondary | ICD-10-CM | POA: Insufficient documentation

## 2017-12-28 DIAGNOSIS — E039 Hypothyroidism, unspecified: Secondary | ICD-10-CM | POA: Insufficient documentation

## 2017-12-28 DIAGNOSIS — G4733 Obstructive sleep apnea (adult) (pediatric): Secondary | ICD-10-CM | POA: Insufficient documentation

## 2017-12-28 DIAGNOSIS — E785 Hyperlipidemia, unspecified: Secondary | ICD-10-CM | POA: Insufficient documentation

## 2017-12-28 DIAGNOSIS — Z79899 Other long term (current) drug therapy: Secondary | ICD-10-CM | POA: Diagnosis not present

## 2017-12-28 HISTORY — DX: Other tear of medial meniscus, current injury, unspecified knee, initial encounter: S83.249A

## 2017-12-28 LAB — CBC
HCT: 43.4 % (ref 36.0–46.0)
Hemoglobin: 13.9 g/dL (ref 12.0–15.0)
MCH: 26.4 pg (ref 26.0–34.0)
MCHC: 32 g/dL (ref 30.0–36.0)
MCV: 82.5 fL (ref 78.0–100.0)
PLATELETS: 380 10*3/uL (ref 150–400)
RBC: 5.26 MIL/uL — ABNORMAL HIGH (ref 3.87–5.11)
RDW: 14.9 % (ref 11.5–15.5)
WBC: 6 10*3/uL (ref 4.0–10.5)

## 2017-12-28 LAB — BASIC METABOLIC PANEL
Anion gap: 10 (ref 5–15)
BUN: 18 mg/dL (ref 6–20)
CALCIUM: 9.8 mg/dL (ref 8.9–10.3)
CO2: 24 mmol/L (ref 22–32)
Chloride: 106 mmol/L (ref 101–111)
Creatinine, Ser: 1.16 mg/dL — ABNORMAL HIGH (ref 0.44–1.00)
GFR calc Af Amer: 57 mL/min — ABNORMAL LOW (ref >60)
GFR, EST NON AFRICAN AMERICAN: 49 mL/min — AB (ref >60)
GLUCOSE: 98 mg/dL (ref 65–99)
POTASSIUM: 4.3 mmol/L (ref 3.5–5.1)
Sodium: 140 mmol/L (ref 135–145)

## 2017-12-28 NOTE — Progress Notes (Signed)
PCP - [facility]  Cardiologist - Dr. Marlou Porch  Chest x-ray - Denies  EKG - 02/25/17 (E)  Stress Test - Denies  ECHO - 01/18/15 (E)  Cardiac Cath - Denies  Sleep Study - Yes- Positive CPAP - Yes  LABS- 12/28/17: CBC, BMP  ASA- Denies  Anesthesia- Yes- Cardiac history/ EKG  Pt denies having chest pain, sob, or fever at this time. All instructions explained to the pt, with a verbal understanding of the material. Pt agrees to go over the instructions while at home for a better understanding. The opportunity to ask questions was provided.

## 2017-12-28 NOTE — Progress Notes (Signed)
[facility]er            12/27/2017                          [place], [place] - [place] [address] Phone: [phone] Fax: [phone]              Your procedure is scheduled on Thursday, May 30th             Report to [facility] Admitting at 8:00 AM             (posted surgery time 10a - 10:55a)             Call this number if you have problems the morning of surgery:            [phone]             Remember:              Nothing to eat or drink after midnight Wednesday, the 29th             Take these medicines the morning of surgery with A SIP OF WATER : Nexium, Synthroid, Tramadol.              4-5 days prior to surgery, STOP TAKING any All Other Aspirin Products, Vitamins, Herbal Supplements, Anti-inflammatories, Fish oils                        Do not wear jewelry, make-up or nail polish.            Do not wear lotions, powders, perfumes, or deodorant.            Do not shave 48 hours prior to surgery.             Do not bring valuables to the hospital.            [facility] is not responsible for any belongings or valuables.  Contacts, dentures or bridgework may not be worn into surgery.  Leave your suitcase in the car.  After surgery it may be brought to your room.  For patients admitted to the hospital, discharge time will be determined by your treatment team.  Patients discharged the day of surgery will not be allowed to drive home, and will need someone to stay with you for the first 24 hrs.  [facility]- Preparing For Surgery  Before surgery, you can play an important role. Because skin is not sterile, your skin needs to be as free of germs as possible. You can reduce the number of germs on your skin by washing with CHG (chlorahexidine gluconate) Soap before surgery.  CHG is an antiseptic cleaner which kills germs and bonds with the skin to continue killing  germs even after washing.    Oral Hygiene is also important to reduce your risk of infection.  Remember - BRUSH YOUR TEETH THE MORNING OF SURGERY WITH YOUR REGULAR TOOTHPASTE  Please do not use if you have an allergy to CHG or antibacterial soaps. If your skin becomes reddened/irritated stop using the CHG.  Do not shave (including legs and underarms) for at least 48 hours prior to first CHG shower. It is OK to shave your face.  Please follow these instructions carefully.   1. Shower the NIGHT BEFORE SURGERY and the [facility]  OF SURGERY with CHG.   2. If you chose to wash your hair, wash your hair first as usual with your normal shampoo.  3. After you shampoo, rinse your hair and body thoroughly to remove the shampoo.  4. Use CHG as you would any other liquid soap. You can apply CHG directly to the skin and wash gently with a scrungie or a clean washcloth.   5. Apply the CHG Soap to your body ONLY FROM THE NECK DOWN.  Do not use on open wounds or open sores. Avoid contact with your eyes, ears, mouth and genitals (private parts). Wash Face and genitals (private parts)  with your normal soap.  6. Wash thoroughly, paying special attention to the area where your surgery will be performed.  7. Thoroughly rinse your body with warm water from the neck down.  8. DO NOT shower/wash with your normal soap after using and rinsing off the CHG Soap.  9. Pat yourself dry with a CLEAN TOWEL.  10. Wear CLEAN PAJAMAS to bed the night before surgery, wear comfortable clothes the morning of surgery  11. Place CLEAN SHEETS on your bed the night of your first shower and DO NOT SLEEP WITH PETS.  Day of Surgery:  Do not apply any deodorants/lotions.  Please wear clean clothes to the hospital/surgery center.   Remember to brush your teeth WITH YOUR REGULAR TOOTHPASTE.  Please read over the following fact sheets that you were given. Pain Booklet, MRSA Information and Surgical Site Infection Prevention

## 2017-12-29 NOTE — Progress Notes (Signed)
Anesthesia Chart Review:  Case:  [id number] Date/Time:  01/06/18 0945   Procedure:  RIGHT KNEE ARTHROSCOPY WITH PARTIAL  MEDIAL MENISCECTOMY (Right )   Anesthesia type:  General   Pre-op diagnosis:  Right Knee MEDIAL MENISCUS TEAR   Location:  [facility] OR ROOM 07 / [facility]   Surgeon:  Tanya Rossetti, MD      DISCUSSION: Patient is a 62 year old female scheduled for the above procedure.   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 History listed also shows MVP, mild AI, MR, and need for SBE prophylaxis. (Last echo 2016 and showed no significant MR, mild AI. She saw Dr. Marlou Buckley then who documented, "No need to repeat echocardiogram on a yearly basis, could consider repeating in approximately 5 years. Also, no dental antibodies [sic] needed. No prior bacterial endocarditis.")  If no acute changes in anticipate she can proceed as planned.  VS: BP 128/72   Pulse 66   Temp 36.8 C   Resp 18   Ht 5\' 5"  (1.651 m)   Wt 218 lb 9.6 oz (99.2 kg)   SpO2 96%   BMI 36.38 kg/m   PROVIDERS: Donella Stade, PA-C is PCP She is not followed by cardiology on a regular basis, but saw Dr. Candee Buckley for evaluation of MR on 6/0/16 with echo repeated (see below). PRN follow-up recommended with consideration of follow-up echo in 5 years.    LABS: Labs reviewed: Acceptable for surgery. (all labs ordered are listed, but only abnormal results are displayed)  Labs Reviewed  BASIC METABOLIC PANEL - Abnormal; Notable for the following components:      Result Value   Creatinine, Ser 1.16 (*)    GFR calc non Af Amer 49 (*)    GFR calc Af Amer 57 (*)    All other components within normal limits  CBC - Abnormal; Notable for the following components:   RBC 5.26 (*)    All other components within normal limits    EKG: 02/25/17:  NSR, incomplete RBBB.   CV: Echo 01/18/15: Study Conclusions - Left ventricle: The cavity size was normal. Wall thickness was increased in a pattern of mild LVH. Systolic function was normal. The estimated ejection fraction was in the range of 60% to 65%. Wall motion was normal; there were no regional wall motion abnormalities. The study is not technically sufficient to allow evaluation of LV diastolic function. - Aortic valve: Sclerosis without stenosis. There was mild regurgitation. - Mitral valve: Poorly visualized. There was no significant regurgitation. - Left atrium: The atrium was mildly dilated at 36 ml/m2. Impressions: - LVEF 60-65%, mild LVH, normal wall motion, cannot estimate diastolic function, mild AI, poorly visualized mitral valve, mildly dialted LA.    Past Medical History:  Diagnosis Date  . Acute medial meniscal tear    Right knee  . Allergy   . Anxiety   . Aortic insufficiency    Mild  . Arthritis   . Carpal tunnel syndrome   . Dysrhythmia    pvc's at times  . GERD (gastroesophageal reflux disease)   . Hyperlipidemia   . Hypothyroidism   . Migraine headache   . Mitral regurgitation   . Mitral valve prolapse   . Nausea alone   . Need for SBE (subacute bacterial endocarditis) prophylaxis   . Pilonidal cyst without mention of  abscess   . Pneumonia Aug 14 2012  . PONV (postoperative nausea and vomiting)   . Sleep apnea    wears CPAP  . Thyroid disease   . Transplanted kidney removed    donated kidney    Past Surgical History:  Procedure Laterality Date  . ABDOMINAL HYSTERECTOMY    . CARPAL TUNNEL RELEASE Right   . CARPAL TUNNEL RELEASE Left 02/25/2017   Procedure: LEFT OPEN CARPAL TUNNEL RELEASE;  Surgeon: Tanya Rossetti, MD;  Location: [facility];  Service: Orthopedics;  Laterality: Left;  . COLONOSCOPY    . KNEE SURGERY    . NEPHRECTOMY    . TONSILLECTOMY    . TOTAL KNEE ARTHROPLASTY Left 09/14/2013   Procedure: LEFT  TOTAL KNEE ARTHROPLASTY;  Surgeon: Tanya Rossetti, MD;  Location: [facility];  Service: Orthopedics;  Laterality: Left;  . TUBAL LIGATION    . WISDOM TOOTH EXTRACTION      MEDICATIONS: . acetaminophen (TYLENOL) 500 MG tablet  . albuterol (PROVENTIL HFA;VENTOLIN HFA) 108 (90 Base) MCG/ACT inhaler  . Armodafinil 150 MG tablet  . calcium carbonate (TUMS - DOSED IN MG ELEMENTAL CALCIUM) 500 MG chewable tablet  . Carboxymethylcellul-Glycerin (LUBRICATING EYE DROPS OP)  . cetirizine (ZYRTEC) 10 MG tablet  . Cholecalciferol (VITAMIN D3) 3000 units TABS  . citalopram (CELEXA) 20 MG tablet  . diclofenac sodium (VOLTAREN) 1 % GEL  . esomeprazole (NEXIUM) 40 MG capsule  . hydrochlorothiazide (MICROZIDE) 12.5 MG capsule  . levothyroxine (SYNTHROID, LEVOTHROID) 137 MCG tablet  . lisinopril (PRINIVIL,ZESTRIL) 2.5 MG tablet  . Lorcaserin HCl ER (BELVIQ XR) 20 MG TB24  . naproxen sodium (ANAPROX) 220 MG tablet  . Omega-3 Fatty Acids (FISH OIL PO)  . ondansetron (ZOFRAN) 8 MG tablet  . rosuvastatin (CRESTOR) 10 MG tablet  . traMADol (ULTRAM) 50 MG tablet   No current facility-administered medications for this encounter.   She is not currently taking Belviq XR.  [facility] Short Stay Center/Anesthesiology Phone [phone] 12/29/2017 2:18 PM

## 2017-12-31 DIAGNOSIS — G4733 Obstructive sleep apnea (adult) (pediatric): Secondary | ICD-10-CM | POA: Diagnosis not present

## 2018-01-06 ENCOUNTER — Ambulatory Visit (HOSPITAL_COMMUNITY): Payer: 59 | Admitting: Vascular Surgery

## 2018-01-06 ENCOUNTER — Ambulatory Visit (HOSPITAL_COMMUNITY)
Admission: RE | Admit: 2018-01-06 | Discharge: 2018-01-06 | Disposition: A | Discharge status: Home / Self Care | Payer: 59 | Source: Ambulatory Visit | Attending: Orthopaedic Surgery | Admitting: Orthopaedic Surgery

## 2018-01-06 ENCOUNTER — Encounter (HOSPITAL_COMMUNITY)
Admission: RE | Disposition: A | Discharge status: Home / Self Care | Payer: Self-pay | Source: Ambulatory Visit | Attending: Orthopaedic Surgery

## 2018-01-06 ENCOUNTER — Encounter (HOSPITAL_COMMUNITY): Payer: Self-pay | Admitting: *Deleted

## 2018-01-06 ENCOUNTER — Ambulatory Visit (HOSPITAL_COMMUNITY): Payer: 59 | Admitting: Anesthesiology

## 2018-01-06 DIAGNOSIS — Z87891 Personal history of nicotine dependence: Secondary | ICD-10-CM | POA: Insufficient documentation

## 2018-01-06 DIAGNOSIS — I10 Essential (primary) hypertension: Secondary | ICD-10-CM | POA: Diagnosis not present

## 2018-01-06 DIAGNOSIS — K219 Gastro-esophageal reflux disease without esophagitis: Secondary | ICD-10-CM | POA: Insufficient documentation

## 2018-01-06 DIAGNOSIS — Z96652 Presence of left artificial knee joint: Secondary | ICD-10-CM | POA: Diagnosis not present

## 2018-01-06 DIAGNOSIS — M23221 Derangement of posterior horn of medial meniscus due to old tear or injury, right knee: Secondary | ICD-10-CM | POA: Diagnosis not present

## 2018-01-06 DIAGNOSIS — M23321 Other meniscus derangements, posterior horn of medial meniscus, right knee: Secondary | ICD-10-CM | POA: Diagnosis not present

## 2018-01-06 DIAGNOSIS — Z791 Long term (current) use of non-steroidal anti-inflammatories (NSAID): Secondary | ICD-10-CM | POA: Insufficient documentation

## 2018-01-06 DIAGNOSIS — E785 Hyperlipidemia, unspecified: Secondary | ICD-10-CM | POA: Diagnosis not present

## 2018-01-06 DIAGNOSIS — F419 Anxiety disorder, unspecified: Secondary | ICD-10-CM | POA: Insufficient documentation

## 2018-01-06 DIAGNOSIS — E039 Hypothyroidism, unspecified: Secondary | ICD-10-CM | POA: Diagnosis not present

## 2018-01-06 DIAGNOSIS — M94261 Chondromalacia, right knee: Secondary | ICD-10-CM | POA: Insufficient documentation

## 2018-01-06 DIAGNOSIS — G4730 Sleep apnea, unspecified: Secondary | ICD-10-CM | POA: Diagnosis not present

## 2018-01-06 DIAGNOSIS — Z79899 Other long term (current) drug therapy: Secondary | ICD-10-CM | POA: Diagnosis not present

## 2018-01-06 DIAGNOSIS — Z905 Acquired absence of kidney: Secondary | ICD-10-CM | POA: Diagnosis not present

## 2018-01-06 DIAGNOSIS — Z79890 Hormone replacement therapy (postmenopausal): Secondary | ICD-10-CM | POA: Insufficient documentation

## 2018-01-06 HISTORY — PX: KNEE ARTHROSCOPY: SHX127

## 2018-01-06 SURGERY — ARTHROSCOPY, KNEE
Anesthesia: General | Site: Knee | Laterality: Right

## 2018-01-06 MED ORDER — TRAMADOL HCL 50 MG PO TABS: 50.0000 mg | ORAL_TABLET | Freq: Four times a day (QID) | ORAL | 0 refills | Status: DC | PRN

## 2018-01-06 MED ORDER — DEXAMETHASONE SODIUM PHOSPHATE 10 MG/ML IJ SOLN
INTRAMUSCULAR | Status: DC | PRN
  Administered 2018-01-06: 10 mg via INTRAVENOUS

## 2018-01-06 MED ORDER — CEFAZOLIN SODIUM-DEXTROSE 2-4 GM/100ML-% IV SOLN
2.0000 g | INTRAVENOUS | Status: AC
  Administered 2018-01-06: 2 g via INTRAVENOUS

## 2018-01-06 MED ORDER — BUPIVACAINE HCL (PF) 0.25 % IJ SOLN
INTRAMUSCULAR | Status: AC
  Filled 2018-01-06: qty 30

## 2018-01-06 MED ORDER — CHLORHEXIDINE GLUCONATE 4 % EX LIQD: 60.0000 mL | Freq: Once | CUTANEOUS | Status: DC

## 2018-01-06 MED ORDER — BUPIVACAINE HCL (PF) 0.25 % IJ SOLN
INTRAMUSCULAR | Status: DC | PRN
  Administered 2018-01-06: 20 mL

## 2018-01-06 MED ORDER — MORPHINE SULFATE (PF) 4 MG/ML IV SOLN
INTRAVENOUS | Status: AC
  Filled 2018-01-06: qty 1

## 2018-01-06 MED ORDER — FENTANYL CITRATE (PF) 250 MCG/5ML IJ SOLN
INTRAMUSCULAR | Status: AC
  Filled 2018-01-06: qty 5

## 2018-01-06 MED ORDER — FENTANYL CITRATE (PF) 100 MCG/2ML IJ SOLN
INTRAMUSCULAR | Status: AC
  Filled 2018-01-06: qty 2

## 2018-01-06 MED ORDER — DEXAMETHASONE SODIUM PHOSPHATE 10 MG/ML IJ SOLN
INTRAMUSCULAR | Status: AC
  Filled 2018-01-06: qty 1

## 2018-01-06 MED ORDER — MIDAZOLAM HCL 2 MG/2ML IJ SOLN
INTRAMUSCULAR | Status: AC
  Filled 2018-01-06: qty 2

## 2018-01-06 MED ORDER — SODIUM CHLORIDE 0.9 % IR SOLN
Status: DC | PRN
  Administered 2018-01-06: 3000 mL

## 2018-01-06 MED ORDER — MIDAZOLAM HCL 5 MG/5ML IJ SOLN
INTRAMUSCULAR | Status: DC | PRN
  Administered 2018-01-06: 2 mg via INTRAVENOUS

## 2018-01-06 MED ORDER — LACTATED RINGERS IV SOLN
INTRAVENOUS | Status: DC
  Administered 2018-01-06: 09:00:00 via INTRAVENOUS

## 2018-01-06 MED ORDER — LIDOCAINE 2% (20 MG/ML) 5 ML SYRINGE
INTRAMUSCULAR | Status: AC
  Filled 2018-01-06: qty 5

## 2018-01-06 MED ORDER — ONDANSETRON HCL 4 MG/2ML IJ SOLN
INTRAMUSCULAR | Status: AC
  Filled 2018-01-06: qty 2

## 2018-01-06 MED ORDER — PROPOFOL 10 MG/ML IV BOLUS
INTRAVENOUS | Status: AC
  Filled 2018-01-06: qty 20

## 2018-01-06 MED ORDER — SCOPOLAMINE 1 MG/3DAYS TD PT72
1.0000 | MEDICATED_PATCH | TRANSDERMAL | Status: DC
  Administered 2018-01-06: 1.5 mg via TRANSDERMAL
  Filled 2018-01-06 (×2): qty 1

## 2018-01-06 MED ORDER — PROPOFOL 10 MG/ML IV BOLUS
INTRAVENOUS | Status: DC | PRN
  Administered 2018-01-06: 20 mg via INTRAVENOUS
  Administered 2018-01-06: 150 mg via INTRAVENOUS

## 2018-01-06 MED ORDER — LIDOCAINE 2% (20 MG/ML) 5 ML SYRINGE
INTRAMUSCULAR | Status: DC | PRN
  Administered 2018-01-06: 60 mg via INTRAVENOUS

## 2018-01-06 MED ORDER — FENTANYL CITRATE (PF) 100 MCG/2ML IJ SOLN
INTRAMUSCULAR | Status: DC | PRN
  Administered 2018-01-06: 50 ug via INTRAVENOUS

## 2018-01-06 MED ORDER — FENTANYL CITRATE (PF) 100 MCG/2ML IJ SOLN
25.0000 ug | INTRAMUSCULAR | Status: DC | PRN
  Administered 2018-01-06: 25 ug via INTRAVENOUS

## 2018-01-06 MED ORDER — ONDANSETRON HCL 4 MG/2ML IJ SOLN
INTRAMUSCULAR | Status: DC | PRN
  Administered 2018-01-06: 4 mg via INTRAVENOUS

## 2018-01-06 MED ORDER — ARTIFICIAL TEARS OPHTHALMIC OINT
TOPICAL_OINTMENT | OPHTHALMIC | Status: AC
  Filled 2018-01-06: qty 3.5

## 2018-01-06 MED ORDER — ONDANSETRON HCL 4 MG/2ML IJ SOLN: 4.0000 mg | Freq: Four times a day (QID) | INTRAMUSCULAR | Status: DC | PRN

## 2018-01-06 MED ORDER — ROCURONIUM BROMIDE 10 MG/ML (PF) SYRINGE
PREFILLED_SYRINGE | INTRAVENOUS | Status: AC
  Filled 2018-01-06: qty 5

## 2018-01-06 MED ORDER — CEFAZOLIN SODIUM-DEXTROSE 2-4 GM/100ML-% IV SOLN
INTRAVENOUS | Status: AC
  Filled 2018-01-06: qty 100

## 2018-01-06 MED ORDER — EPHEDRINE SULFATE 50 MG/ML IJ SOLN
INTRAMUSCULAR | Status: DC | PRN
  Administered 2018-01-06: 5 mg via INTRAVENOUS

## 2018-01-06 MED FILL — traMADol HCL 50 MG TABS: 50 | 6 days supply | Qty: 50 | Fill #0

## 2018-01-06 SURGICAL SUPPLY — 34 items
BANDAGE ACE 4X5 VEL STRL LF (GAUZE/BANDAGES/DRESSINGS) ×2 IMPLANT
BANDAGE ACE 6X5 VEL STRL LF (GAUZE/BANDAGES/DRESSINGS) ×2 IMPLANT
BLADE CLIPPER SURG (BLADE) IMPLANT
BLADE CUTTER GATOR 3.5 (BLADE) ×2 IMPLANT
DRAPE ARTHROSCOPY W/POUCH 114 (DRAPES) ×2 IMPLANT
DRAPE U-SHAPE 47X51 STRL (DRAPES) ×2 IMPLANT
DRSG PAD ABDOMINAL 8X10 ST (GAUZE/BANDAGES/DRESSINGS) IMPLANT
DURAPREP 26ML APPLICATOR (WOUND CARE) ×2 IMPLANT
GAUZE SPONGE 4X4 12PLY STRL (GAUZE/BANDAGES/DRESSINGS) ×2 IMPLANT
GAUZE XEROFORM 1X8 LF (GAUZE/BANDAGES/DRESSINGS) ×2 IMPLANT
GLOVE BIOGEL PI IND STRL 8 (GLOVE) ×2 IMPLANT
GLOVE BIOGEL PI INDICATOR 8 (GLOVE) ×2
GLOVE ORTHO TXT STRL SZ7.5 (GLOVE) ×2 IMPLANT
GLOVE SURG ORTHO 8.0 STRL STRW (GLOVE) ×2 IMPLANT
GOWN STRL REUS W/ TWL LRG LVL3 (GOWN DISPOSABLE) ×1 IMPLANT
GOWN STRL REUS W/ TWL XL LVL3 (GOWN DISPOSABLE) ×2 IMPLANT
GOWN STRL REUS W/TWL LRG LVL3 (GOWN DISPOSABLE) ×1
GOWN STRL REUS W/TWL XL LVL3 (GOWN DISPOSABLE) ×2
KIT TURNOVER KIT B (KITS) ×2 IMPLANT
MANIFOLD NEPTUNE II (INSTRUMENTS) IMPLANT
PACK ARTHROSCOPY DSU (CUSTOM PROCEDURE TRAY) ×2 IMPLANT
PAD ABD 8X10 STRL (GAUZE/BANDAGES/DRESSINGS) ×2 IMPLANT
PAD ARMBOARD 7.5X6 YLW CONV (MISCELLANEOUS) ×4 IMPLANT
PAD CAST 4YDX4 CTTN HI CHSV (CAST SUPPLIES) ×1 IMPLANT
PADDING CAST COTTON 4X4 STRL (CAST SUPPLIES) ×1
PADDING CAST COTTON 6X4 STRL (CAST SUPPLIES) ×2 IMPLANT
SET ARTHROSCOPY TUBING (MISCELLANEOUS) ×1
SET ARTHROSCOPY TUBING LN (MISCELLANEOUS) ×1 IMPLANT
SPONGE LAP 4X18 RFD (DISPOSABLE) ×2 IMPLANT
SUT ETHILON 3 0 PS 1 (SUTURE) ×2 IMPLANT
TOWEL OR 17X24 6PK STRL BLUE (TOWEL DISPOSABLE) ×2 IMPLANT
TOWEL OR 17X26 10 PK STRL BLUE (TOWEL DISPOSABLE) ×2 IMPLANT
WAND HAND CNTRL MULTIVAC 90 (MISCELLANEOUS) IMPLANT
WATER STERILE IRR 1000ML POUR (IV SOLUTION) ×2 IMPLANT

## 2018-01-06 NOTE — Discharge Instructions (Signed)
Expect right knee swelling - ice and elevation as needed. You can increase your activities as comfort allows. You can remove your dressings tomorrow and get your knee wet daily in the shower. Full weight bearing as tolerated.

## 2018-01-06 NOTE — Transfer of Care (Signed)
Immediate Anesthesia Transfer of Care Note  Patient: [name]  Procedure(s) Performed: RIGHT KNEE ARTHROSCOPY WITH PARTIAL  MEDIAL MENISCECTOMY (Right Knee)  Patient Location: PACU  Anesthesia Type:General  Level of Consciousness: awake, alert  and oriented  Airway & Oxygen Therapy: Patient Spontanous Breathing and Patient connected to nasal cannula oxygen  Post-op Assessment: Report given to RN, Post -op Vital signs reviewed and stable and Patient moving all extremities  Post vital signs: Reviewed and stable  Last Vitals:  Vitals Value Taken Time  BP 127/72 01/06/2018 11:25 AM  Temp    Pulse 80 01/06/2018 11:27 AM  Resp 14 01/06/2018 11:27 AM  SpO2 98 % 01/06/2018 11:27 AM  Vitals shown include unvalidated device data.  Last Pain:  Vitals:   01/06/18 1126  TempSrc:   PainSc: (P) Asleep      Patients Stated Pain Goal: 3 (76/14/70 9295)  Complications: No apparent anesthesia complications

## 2018-01-06 NOTE — Anesthesia Postprocedure Evaluation (Signed)
Anesthesia Post Note  Patient: [name]  Procedure(s) Performed: RIGHT KNEE ARTHROSCOPY WITH PARTIAL  MEDIAL MENISCECTOMY (Right Knee)     Patient location during evaluation: PACU Anesthesia Type: General Level of consciousness: awake and alert Pain management: pain level controlled Vital Signs Assessment: post-procedure vital signs reviewed and stable Respiratory status: spontaneous breathing, nonlabored ventilation, respiratory function stable and patient connected to nasal cannula oxygen Cardiovascular status: blood pressure returned to baseline and stable Postop Assessment: no apparent nausea or vomiting Anesthetic complications: no    Last Vitals:  Vitals:   01/06/18 1230 01/06/18 1234  BP:    Pulse: 63 60  Resp: 18 15  Temp:    SpO2: 100% 100%    Last Pain:  Vitals:   01/06/18 1215  TempSrc:   PainSc: 2                  Gina Costilla S

## 2018-01-06 NOTE — Progress Notes (Signed)
Orthopedic Tech Progress Note Patient Details:  [name] 04-Mar-1956 [id number]  Ortho Devices Type of Ortho Device: Crutches Ortho Device/Splint Interventions: Application   Post Interventions Patient Tolerated: Well Instructions Provided: Care of device Viewed order from RN order list  [name] 01/06/2018, 1:01 PM

## 2018-01-06 NOTE — Op Note (Signed)
NAME: [name], [name] MEDICAL RECORD KN:[id number] ACCOUNT [id number] DATE OF BIRTH:1956-07-29 FACILITY: [facility] LOCATION: [facility] PHYSICIAN:Treazure Nery [name] Fort, MD  OPERATIVE REPORT  DATE OF PROCEDURE:  01/06/2018  PREOPERATIVE DIAGNOSIS:  Right knee with posterior horn to midbody medial meniscal tear and cartilage thinning in the medial compartment.  POSTOPERATIVE DIAGNOSIS:  Right knee with posterior horn to midbody medial meniscal tear and cartilage thinning in the medial compartment.  FINDINGS: 1.  Right knee posterior horn to midbody medial meniscal tear. 2.  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  PROCEDURE:  Right knee arthroscopy with debridement and a partial medial meniscectomy.  SURGEON:  Lind Guest. Ninfa Linden, MD  ASSISTANT:  Benita Stabile, PA-C   ANESTHESIA: 1.  General. 2.  Local with plain Marcaine.  ESTIMATED BLOOD LOSS:  Minimal.  COMPLICATIONS:  None.  INDICATIONS:  The patient is a 62 year old well known to me.  She has been dealing with right knee problems for some time now.  We have tried conservative treatment, and this had failed.  We did obtain an MRI of her knee based on plain films showing  still a well-maintained joint space.  The MRI did show extensive tearing of the posterior horn and midbody of the medial meniscus and thinning of the cartilage throughout her knee, but it did not describe any full-thickness cartilage deficits.  She has  had a previous left total knee arthroplasty.  At this point, I did feel that arthroscopic intervention was warranted based on her pain with swelling and mechanical symptoms.  The risks and benefits were explained to her in detail, and she did wish to  proceed.  DESCRIPTION OF PROCEDURE:  After informed consent was obtained and appropriate right knee was marked, she was brought to the operating room and placed supine on the operating table.  General anesthesia was then obtained.  A right  thigh, knee, leg and  ankle were prepped and draped with DuraPrep and sterile drapes including a sterile stockinette.  With the bed raised and a lateral leg post utilized, the right operative knee was flexed off the table.  A timeout was called to identify correct patient,  correct right knee.  I then made an anterolateral arthroscopy portal and placed a cannula into the knee.  I did find a moderate effusion.  I went to the medial compartment and then placed the camera in the knee and made an anterior medial incision.  We  probed the medial compartment, and we found more extensive cartilage loss than we had anticipated on the medial femoral condyle.  It was very friable cartilage.  Using arthroscopic shaver, we did perform a partial chondroplasty, debriding areas back to a  stable cartilage.  We then found a complex posterior horn degenerative medial meniscal tear, and this was to the midbody.  Using a small cutting blade and arthroscopic shaver, we debrided this back to a stable margin, performing a partial medial  meniscectomy.  We then assessed the intercondylar area and found the ACL and PCL to be intact.  With the knee in the figure-of-four position, we found the lateral compartment pristine with all intact structures.  Finally, the patellofemoral joint was  obtained, and there were just some mild cartilage changes in the patellofemoral joint.  We then allowed fluid lavage of the knee and then drained all the fluid from the knee.  We closed the portal sites with interrupted nylon suture and placed Marcaine  into the knee itself.  A well-padded sterile  dressing was applied.  She was awakened, extubated, and taken to recovery room in stable condition.  All final counts were correct.  There were no complications noted.  LN/NUANCE  D:01/06/2018 T:01/06/2018 JOB:[id number]

## 2018-01-06 NOTE — H&P (Signed)
[name] is an 62 y.o. female.   Chief Complaint:   Right knee pain and swelling. HPI: [place] is well-known to me.  She is been having problems with her right knee for some time now.  We have tried conservative treatment including activity modification and anti-inflammatories as well as injections in the knee.  At this point MRI does show medial meniscal tear with some areas of significant loss in the knee.  She would like to try an arthroscopic intervention for the knee with a partial medial meniscectomy and I agree with this based on MRI findings and her clinical exam.  Past Medical History:  Diagnosis Date  . Acute medial meniscal tear    Right knee  . Allergy   . Anxiety   . Aortic insufficiency    Mild  . Arthritis   . Carpal tunnel syndrome   . Dysrhythmia    pvc's at times  . GERD (gastroesophageal reflux disease)   . Hyperlipidemia   . Hypothyroidism   . Migraine headache   . Mitral regurgitation   . Mitral valve prolapse   . Nausea alone   . Need for SBE (subacute bacterial endocarditis) prophylaxis   . Pilonidal cyst without mention of abscess   . Pneumonia Aug 14 2012  . PONV (postoperative nausea and vomiting)   . Sleep apnea    wears CPAP  . Thyroid disease   . Transplanted kidney removed    donated kidney    Past Surgical History:  Procedure Laterality Date  . ABDOMINAL HYSTERECTOMY    . CARPAL TUNNEL RELEASE Right   . CARPAL TUNNEL RELEASE Left 02/25/2017   Procedure: LEFT OPEN CARPAL TUNNEL RELEASE;  Surgeon: [name], MD;  Location: [facility];  Service: Orthopedics;  Laterality: Left;  . COLONOSCOPY    . KNEE SURGERY    . NEPHRECTOMY    . TONSILLECTOMY    . TOTAL KNEE ARTHROPLASTY Left 09/14/2013   Procedure: LEFT TOTAL KNEE ARTHROPLASTY;  Surgeon: [name], MD;  Location: [facility];  Service: Orthopedics;  Laterality: Left;  . TUBAL LIGATION    . WISDOM TOOTH EXTRACTION      Family History  Problem Relation Age of Onset  .  Diabetes Mother   . Hypertension Mother   . Heart disease Father   . Hypertension Father   . Thyroid disease Father   . Cancer Maternal Aunt   . Cancer Maternal Aunt   . Colon cancer Neg Hx   . Esophageal cancer Neg Hx   . Rectal cancer Neg Hx   . Stomach cancer Neg Hx    Social History:  reports that she quit smoking about 34 years ago. She has never used smokeless tobacco. She reports that she drinks alcohol. She reports that she does not use drugs.  Allergies:  Allergies  Allergen Reactions  . Percocet [Oxycodone-Acetaminophen] Other (See Comments)    SYNCOPE TACHYCARDIA  . Hydrocodone-Acetaminophen Nausea And Vomiting and Other (See Comments)    dizziness  . Lipitor [Atorvastatin] Other (See Comments)    Ear pain/fullness in ear "as if I were under water"  . Saxenda [Liraglutide -Weight Management] Other (See Comments)    Hives/itching/site reaction  . Shrimp [Shellfish Allergy] Itching, Nausea And Vomiting and Swelling    SWELLING REACTION UNSPECIFIED   . Belviq Xr [Lorcaserin Hcl Er] Other (See Comments)    Gained weight on eat.   . Morphine And Related Nausea And Vomiting    Medications Prior to  Admission  Medication Sig Dispense Refill  . acetaminophen (TYLENOL) 500 MG tablet Take 1,000 mg by mouth every 6 (six) hours as needed for moderate pain.    . Armodafinil 150 MG tablet Take 1 tablet (150 mg total) by mouth daily. (Patient taking differently: Take 75 mg by mouth daily as needed (for alertness.). ) 90 tablet 1  . Carboxymethylcellul-Glycerin (LUBRICATING EYE DROPS OP) Apply 1 drop to eye daily as needed (dry eyes).    . cetirizine (ZYRTEC) 10 MG tablet Take 1 tablet (10 mg total) by mouth at bedtime. 90 tablet 3  . Cholecalciferol (VITAMIN D3) 3000 units TABS Take 3,000 Units by mouth 2 (two) times a week. Wednesdays & Saturdays.    . citalopram (CELEXA) 20 MG tablet Take 1 tablet (20 mg total) by mouth daily. (Patient taking differently: Take 10 mg by mouth at  bedtime. ) 90 tablet 3  . diclofenac sodium (VOLTAREN) 1 % GEL Apply 2-4 g topically 4 (four) times daily as needed (for knee pain/discomfort).    Marland Kitchen esomeprazole (NEXIUM) 40 MG capsule TAKE 1 CAPSULE (40 MG TOTAL) BY MOUTH DAILY BEFORE BREAKFAST. (Patient taking differently: Take 40 mg by mouth daily before breakfast. TAKE 1 CAPSULE (40 MG TOTAL) BY MOUTH DAILY BEFORE BREAKFAST.) 90 capsule 3  . hydrochlorothiazide (MICROZIDE) 12.5 MG capsule Take 1 capsule (12.5 mg total) by mouth daily. 90 capsule 3  . levothyroxine (SYNTHROID, LEVOTHROID) 137 MCG tablet Take 1 tablet (137 mcg total) by mouth daily before breakfast. 30 tablet 11  . lisinopril (PRINIVIL,ZESTRIL) 2.5 MG tablet Take 1 tablet (2.5 mg total) by mouth daily. 90 tablet 3  . naproxen sodium (ANAPROX) 220 MG tablet Take 220-440 mg by mouth daily as needed (pain).    . Omega-3 Fatty Acids (FISH OIL PO) Take 1 capsule by mouth daily.    . rosuvastatin (CRESTOR) 10 MG tablet Take 1 tablet (10 mg total) by mouth daily. (Patient taking differently: Take 10 mg by mouth at bedtime. ) 90 tablet 3  . albuterol (PROVENTIL HFA;VENTOLIN HFA) 108 (90 Base) MCG/ACT inhaler Inhale 2 puffs into the lungs every 6 (six) hours as needed for wheezing or shortness of breath.    . calcium carbonate (TUMS - DOSED IN MG ELEMENTAL CALCIUM) 500 MG chewable tablet Chew 1 tablet by mouth as needed for indigestion or heartburn.    . Lorcaserin HCl ER (BELVIQ XR) 20 MG TB24 Take 1 tablet daily by mouth. (Patient not taking: Reported on 12/24/2017) 30 tablet 1  . ondansetron (ZOFRAN) 8 MG tablet Take 8 mg by mouth every 8 (eight) hours as needed for nausea or vomiting.    . traMADol (ULTRAM) 50 MG tablet Take 2 tablets (100 mg total) by mouth every 6 (six) hours as needed. (Patient taking differently: Take 50-100 mg by mouth every 6 (six) hours as needed (for pain.). ) 60 tablet 0    No results found for this or any previous visit (from the past 48 hour(s)). No results  found.  Review of Systems  Musculoskeletal: Positive for joint pain.  All other systems reviewed and are negative.   Blood pressure 128/74, pulse 71, temperature 97.7 F (36.5 C), temperature source Oral, resp. rate 18, weight 218 lb (98.9 kg), SpO2 98 %. Physical Exam  Constitutional: She is oriented to person, place, and time. She appears well-developed and well-nourished.  HENT:  Head: Normocephalic and atraumatic.  Eyes: Pupils are equal, round, and reactive to light. EOM are normal.  Neck: Normal range  of motion. Neck supple.  Cardiovascular: Normal rate.  Respiratory: Effort normal.  GI: Soft. Bowel sounds are normal.  Musculoskeletal:       Right knee: She exhibits swelling, abnormal alignment, bony tenderness and abnormal meniscus. Tenderness found. Medial joint line tenderness noted.  Neurological: She is alert and oriented to person, place, and time.  Skin: Skin is warm and dry.  Psychiatric: She has a normal mood and affect.     Assessment/Plan Right knee symptomatic medial meniscal tear  We will proceed to the OR today as an outpatient for right knee arthroscopy and partial meniscectomy.  We will address any meniscal tears with debridement as well as assessing the cartilage in general and performing chondroplasty as necessary.  The risk minutes of surgery been discussed in detail and informed consent is obtained.  [name], MD 01/06/2018, 10:16 AM

## 2018-01-06 NOTE — Anesthesia Procedure Notes (Signed)
Procedure Name: LMA Insertion Date/Time: 01/06/2018 10:37 AM Performed by: Kyung Rudd, CRNA Pre-anesthesia Checklist: Patient identified, Emergency Drugs available, Suction available and Patient being monitored Patient Re-evaluated:Patient Re-evaluated prior to induction Oxygen Delivery Method: Circle system utilized Preoxygenation: Pre-oxygenation with 100% oxygen Induction Type: IV induction LMA: LMA inserted LMA Size: 4.0 Number of attempts: 1 Placement Confirmation: positive ETCO2 and breath sounds checked- equal and bilateral Tube secured with: Tape

## 2018-01-06 NOTE — Brief Op Note (Signed)
01/06/2018  11:18 AM  PATIENT:  [name]  62 y.o. female  PRE-OPERATIVE DIAGNOSIS:  Right Knee MEDIAL MENISCUS TEAR  POST-OPERATIVE DIAGNOSIS:  Right Knee MEDIAL MENISCUS TEAR  PROCEDURE:  Procedure(s): RIGHT KNEE ARTHROSCOPY WITH PARTIAL  MEDIAL MENISCECTOMY (Right)  SURGEON:  Surgeon(s) and Role:    [name], MD - Primary  PHYSICIAN ASSISTANT: Benita Stabile, PA-C  ANESTHESIA:   local and general  COUNTS:  YES  DICTATION: .Other Dictation: Dictation Number [id number]  PLAN OF CARE: Discharge to home after PACU  PATIENT DISPOSITION:  PACU - hemodynamically stable.   Delay start of Pharmacological VTE agent (>24hrs) due to surgical blood loss or risk of bleeding: no

## 2018-01-06 NOTE — Anesthesia Preprocedure Evaluation (Signed)
Anesthesia Evaluation  Patient identified by MRN, date of birth, ID band Patient awake    Reviewed: Allergy & Precautions, H&P , NPO status , Patient's Chart, lab work & pertinent test results  History of Anesthesia Complications (+) PONV and history of anesthetic complications  Airway Mallampati: II   Neck ROM: full    Dental   Pulmonary sleep apnea , former smoker,    breath sounds clear to auscultation       Cardiovascular hypertension, + Valvular Problems/Murmurs  Rhythm:regular Rate:Normal  TTE (2016): EF 65%. Mild AI   Neuro/Psych  Headaches, PSYCHIATRIC DISORDERS Anxiety  Neuromuscular disease    GI/Hepatic GERD  ,  Endo/Other  Hypothyroidism   Renal/GU Pt donated one kidney. Cr 1.16     Musculoskeletal  (+) Arthritis ,   Abdominal   Peds  Hematology   Anesthesia Other Findings   Reproductive/Obstetrics                             Anesthesia Physical Anesthesia Plan  ASA: II  Anesthesia Plan: General   Post-op Pain Management:    Induction: Intravenous  PONV Risk Score and Plan: 4 or greater and Ondansetron, Dexamethasone, Midazolam, Scopolamine patch - Pre-op and Treatment may vary due to age or medical condition  Airway Management Planned: LMA  Additional Equipment:   Intra-op Plan:   Post-operative Plan:   Informed Consent:   Plan Discussed with: CRNA, Anesthesiologist and Surgeon  Anesthesia Plan Comments:         Anesthesia Quick Evaluation

## 2018-01-07 ENCOUNTER — Encounter (HOSPITAL_COMMUNITY): Payer: Self-pay | Admitting: Orthopaedic Surgery

## 2018-01-10 MED FILL — LEVOTHYROXINE 137 MCG TAB: 137 | 30 days supply | Qty: 30 | Fill #3

## 2018-01-10 MED FILL — ROSUVASTATIN CALCIUM 10 MG: 10 | 90 days supply | Qty: 90 | Fill #1

## 2018-01-27 ENCOUNTER — Telehealth: Payer: Self-pay | Admitting: Physician Assistant

## 2018-01-27 NOTE — Telephone Encounter (Signed)
Received an approval from [facility] that Kirke Shaggy was approved. Pharmacy notified.

## 2018-01-31 MED FILL — LISINOPRIL 2.5 MG TABLET: 2.5 | 90 days supply | Qty: 90 | Fill #1

## 2018-01-31 MED FILL — ESOMEPRAZOLE MAG DR 40 MG C: 40 | 90 days supply | Qty: 90 | Fill #1

## 2018-02-03 ENCOUNTER — Ambulatory Visit (INDEPENDENT_AMBULATORY_CARE_PROVIDER_SITE_OTHER): Payer: 59 | Admitting: Orthopaedic Surgery

## 2018-02-03 DIAGNOSIS — G8929 Other chronic pain: Secondary | ICD-10-CM

## 2018-02-03 DIAGNOSIS — M1711 Unilateral primary osteoarthritis, right knee: Secondary | ICD-10-CM

## 2018-02-03 DIAGNOSIS — M25561 Pain in right knee: Secondary | ICD-10-CM

## 2018-02-03 MED FILL — CITALOPRAM HBR 20 MG TABLET: 20 | 90 days supply | Qty: 90 | Fill #1

## 2018-02-03 NOTE — Progress Notes (Signed)
Izumi is following up for her right knee.  We performed an arthroscopic intervention 28 days ago with the right knee and found a large medial meniscal tear and significant cartilage wear the medial compartment of her knee.  Her ACL PCL were intact.  The lateral side looked pristine.  There was not significant patellofemoral disease.  She still having significant medial joint line tenderness of her knee and this is definitely affecting her mobility and her quality of life as well as her activities of daily living.  She is on her feet all day long working.  On exam her knee feels loosely stable on the right knee.  There is varus malalignment that is easily correctable.  She lacks full extension but just a few degrees but I feel that this is potentially related to just the pain in her knee and a slight effusion.  We went over arthroscopy pictures.  I do feel that she is a perfect candidate for considering a partial knee replacement.  I showed her knee model explained the rationale behind this.  Would like to send her to 1 of my colleagues Dr. Mardelle Matte to consider this surgery.  I will personally contact Dr. Mardelle Matte about this as well.  [place] also knows him well and is fine with this referral.

## 2018-02-11 ENCOUNTER — Telehealth: Payer: Self-pay | Admitting: Cardiology

## 2018-02-11 NOTE — Telephone Encounter (Signed)
° °  [facility] Pre-operative Risk Assessment    Request for surgical clearance:  1. What type of surgery is being performed? Right Partial Knee Replacement  2. When is this surgery scheduled?  02/18/18   3. What type of clearance is required (medical clearance vs. Pharmacy clearance to hold med vs. Both)? Both   4. Are there any medications that need to be held prior to surgery and how long? Please advise any medications that need to be held and how long prior to procedure.  5. Practice name and name of physician performing surgery?  [facility], Dr. Johnny Bridge  6. What is your office phone number [phone]  Ext [phone]   Attn: Sherri   7.   What is your office fax number [phone] Attn: Sherri  8.   Anesthesia type (None, local, MAC, general) ? Not listed.    [name] 02/11/2018, 4:11 PM  _________________________________________________________________   (provider comments below)

## 2018-02-11 NOTE — Telephone Encounter (Signed)
Left a message to call back.

## 2018-02-11 NOTE — Telephone Encounter (Signed)
   Primary Cardiologist:Dr Skains  Chart reviewed as part of pre-operative protocol coverage. Because of [name]'s past medical history and time since last visit, he/she will require a follow-up visit in order to better assess preoperative cardiovascular risk.  Pre-op covering staff: - Please schedule appointment and call patient to inform them. - Please contact requesting surgeon's office via preferred method (i.e, phone, fax) to inform them of need for appointment prior to surgery.  Kerin Ransom, PA-C  02/11/2018, 4:33 PM

## 2018-02-14 ENCOUNTER — Other Ambulatory Visit: Payer: Self-pay

## 2018-02-14 ENCOUNTER — Encounter (HOSPITAL_BASED_OUTPATIENT_CLINIC_OR_DEPARTMENT_OTHER)
Admission: RE | Admit: 2018-02-14 | Discharge: 2018-02-14 | Disposition: A | Discharge status: Home / Self Care | Payer: 59 | Source: Ambulatory Visit | Attending: Orthopedic Surgery | Admitting: Orthopedic Surgery

## 2018-02-14 ENCOUNTER — Encounter (HOSPITAL_BASED_OUTPATIENT_CLINIC_OR_DEPARTMENT_OTHER): Payer: Self-pay | Admitting: *Deleted

## 2018-02-14 DIAGNOSIS — M1711 Unilateral primary osteoarthritis, right knee: Secondary | ICD-10-CM | POA: Diagnosis not present

## 2018-02-14 DIAGNOSIS — Z01812 Encounter for preprocedural laboratory examination: Secondary | ICD-10-CM | POA: Insufficient documentation

## 2018-02-14 DIAGNOSIS — Z01818 Encounter for other preprocedural examination: Secondary | ICD-10-CM | POA: Insufficient documentation

## 2018-02-14 LAB — BASIC METABOLIC PANEL
Anion gap: 8 (ref 5–15)
BUN: 18 mg/dL (ref 8–23)
CO2: 30 mmol/L (ref 22–32)
CREATININE: 1.1 mg/dL — AB (ref 0.44–1.00)
Calcium: 9.9 mg/dL (ref 8.9–10.3)
Chloride: 104 mmol/L (ref 98–111)
GFR calc Af Amer: >60 mL/min (ref >60)
GFR, EST NON AFRICAN AMERICAN: 52 mL/min — AB (ref >60)
GLUCOSE: 84 mg/dL (ref 70–99)
Potassium: 4.2 mmol/L (ref 3.5–5.1)
SODIUM: 142 mmol/L (ref 135–145)

## 2018-02-14 LAB — SURGICAL PCR SCREEN
MRSA, PCR: NEGATIVE
STAPHYLOCOCCUS AUREUS: NEGATIVE

## 2018-02-14 NOTE — Telephone Encounter (Signed)
Please arrange appt for surgical clearance, have not seen since 2014.  Thanks.

## 2018-02-14 NOTE — Telephone Encounter (Signed)
Follow Up: ° ° ° °Returning Lisa's call from yesterday. °

## 2018-02-14 NOTE — Telephone Encounter (Signed)
Called pt to scehdule appt with [name] at 11:30 am on 02/16/18. Patient is aware and verbal understanding.

## 2018-02-15 ENCOUNTER — Emergency Department (HOSPITAL_COMMUNITY)
Admission: EM | Admit: 2018-02-15 | Discharge: 2018-02-16 | Disposition: A | Discharge status: Home / Self Care | Payer: 59 | Attending: Emergency Medicine | Admitting: Emergency Medicine

## 2018-02-15 ENCOUNTER — Encounter (HOSPITAL_COMMUNITY): Payer: Self-pay | Admitting: Emergency Medicine

## 2018-02-15 ENCOUNTER — Other Ambulatory Visit: Payer: Self-pay

## 2018-02-15 ENCOUNTER — Emergency Department (HOSPITAL_COMMUNITY): Payer: 59

## 2018-02-15 DIAGNOSIS — Z87891 Personal history of nicotine dependence: Secondary | ICD-10-CM | POA: Insufficient documentation

## 2018-02-15 DIAGNOSIS — R1111 Vomiting without nausea: Secondary | ICD-10-CM | POA: Diagnosis not present

## 2018-02-15 DIAGNOSIS — S060X1A Concussion with loss of consciousness of 30 minutes or less, initial encounter: Secondary | ICD-10-CM | POA: Diagnosis not present

## 2018-02-15 DIAGNOSIS — S6291XA Unspecified fracture of right wrist and hand, initial encounter for closed fracture: Secondary | ICD-10-CM | POA: Diagnosis not present

## 2018-02-15 DIAGNOSIS — Z79899 Other long term (current) drug therapy: Secondary | ICD-10-CM | POA: Diagnosis not present

## 2018-02-15 DIAGNOSIS — Y999 Unspecified external cause status: Secondary | ICD-10-CM | POA: Diagnosis not present

## 2018-02-15 DIAGNOSIS — E039 Hypothyroidism, unspecified: Secondary | ICD-10-CM | POA: Diagnosis not present

## 2018-02-15 DIAGNOSIS — Y93K1 Activity, walking an animal: Secondary | ICD-10-CM | POA: Insufficient documentation

## 2018-02-15 DIAGNOSIS — R42 Dizziness and giddiness: Secondary | ICD-10-CM | POA: Diagnosis not present

## 2018-02-15 DIAGNOSIS — W19XXXA Unspecified fall, initial encounter: Secondary | ICD-10-CM | POA: Diagnosis not present

## 2018-02-15 DIAGNOSIS — R52 Pain, unspecified: Secondary | ICD-10-CM

## 2018-02-15 DIAGNOSIS — Y929 Unspecified place or not applicable: Secondary | ICD-10-CM | POA: Insufficient documentation

## 2018-02-15 DIAGNOSIS — M25561 Pain in right knee: Secondary | ICD-10-CM | POA: Diagnosis not present

## 2018-02-15 DIAGNOSIS — S79911A Unspecified injury of right hip, initial encounter: Secondary | ICD-10-CM | POA: Diagnosis not present

## 2018-02-15 DIAGNOSIS — S62111A Displaced fracture of triquetrum [cuneiform] bone, right wrist, initial encounter for closed fracture: Secondary | ICD-10-CM | POA: Diagnosis not present

## 2018-02-15 DIAGNOSIS — R110 Nausea: Secondary | ICD-10-CM | POA: Diagnosis not present

## 2018-02-15 DIAGNOSIS — S0990XA Unspecified injury of head, initial encounter: Secondary | ICD-10-CM | POA: Diagnosis not present

## 2018-02-15 DIAGNOSIS — M25531 Pain in right wrist: Secondary | ICD-10-CM | POA: Diagnosis not present

## 2018-02-15 DIAGNOSIS — S199XXA Unspecified injury of neck, initial encounter: Secondary | ICD-10-CM | POA: Diagnosis not present

## 2018-02-15 DIAGNOSIS — I10 Essential (primary) hypertension: Secondary | ICD-10-CM | POA: Diagnosis not present

## 2018-02-15 DIAGNOSIS — I447 Left bundle-branch block, unspecified: Secondary | ICD-10-CM | POA: Diagnosis not present

## 2018-02-15 DIAGNOSIS — M25551 Pain in right hip: Secondary | ICD-10-CM | POA: Diagnosis not present

## 2018-02-15 MED ORDER — ACETAMINOPHEN 500 MG PO TABS
1000.0000 mg | ORAL_TABLET | Freq: Once | ORAL | Status: AC
  Administered 2018-02-15: 1000 mg via ORAL
  Filled 2018-02-15: qty 2

## 2018-02-15 MED ORDER — ONDANSETRON HCL 4 MG/2ML IJ SOLN
4.0000 mg | Freq: Once | INTRAMUSCULAR | Status: AC
  Administered 2018-02-15: 4 mg via INTRAVENOUS
  Filled 2018-02-15: qty 2

## 2018-02-15 MED FILL — LEVOTHYROXINE 137 MCG TAB: 137 | 30 days supply | Qty: 30 | Fill #4

## 2018-02-15 NOTE — ED Provider Notes (Signed)
[facility] EMERGENCY DEPARTMENT Provider Note   CSN: [id number] Arrival date & time: 02/15/18  2147     History   Chief Complaint Chief Complaint  Patient presents with  . Fall    HPI [name] is a 62 y.o. female with HLD, hypothyroidism, MVP, aortic insufficiency, GERD, who presents after a mechanical fall. Patient was walking her dog when her dog pulled her down. Patient landed on her right wrist and hit her head. She lost consciousness. Patient awoke and called her husband.   She reports right wrist pain that is worsened with movement. She also endorses right hip pain. She says that she currently has vertigo ("room is spinning"), photophobia, and headache.   Patient denies syncope, chest pain, palpitations, and SOB. She denies recent illness.   Past Medical History:  Diagnosis Date  . Acute medial meniscal tear    Right knee  . Allergy   . Anxiety   . Aortic insufficiency    Mild  . Arthritis   . Carpal tunnel syndrome   . Dysrhythmia    pvc's at times  . GERD (gastroesophageal reflux disease)   . Hyperlipidemia   . Hypothyroidism   . Migraine headache   . Mitral regurgitation   . Mitral valve prolapse   . Nausea alone   . Need for SBE (subacute bacterial endocarditis) prophylaxis   . Pilonidal cyst without mention of abscess   . Pneumonia Aug 14 2012  . PONV (postoperative nausea and vomiting)    states she vasovagals  . Sleep apnea    wears CPAP  . Thyroid disease   . Transplanted kidney removed    donated kidney    Patient Active Problem List   Diagnosis Date Noted  . Other meniscus derangements, posterior horn of medial meniscus, right knee 11/24/2017  . Pain of right thumb 11/24/2017  . Trigger thumb, right thumb 11/24/2017  . Pre-diabetes 10/19/2017  . Pain in thumb joint with movement of right hand 10/19/2017  . Class 2 obesity due to excess calories without serious comorbidity with body mass index (BMI) of 36.0 to 36.9  in adult 10/19/2017  . Unilateral primary osteoarthritis, right knee 09/27/2017  . Chronic pain of right knee 09/13/2017  . Hypertriglyceridemia 07/14/2017  . Elevated fasting glucose 07/14/2017  . Status post carpal tunnel release 03/11/2017  . Carpal tunnel syndrome, left upper limb 02/25/2017  . Daytime sleepiness 01/24/2017  . OSA (obstructive sleep apnea) 12/23/2015  . Hyperlipidemia 11/22/2014  . Single kidney 11/22/2014  . Hypothyroidism 11/22/2014  . Nausea alone 11/18/2013  . Abnormal transaminases 11/18/2013  . Abdominal pain, chronic, right upper quadrant 11/16/2013  . Candidiasis of female genitalia 11/16/2013  . Elevated liver enzymes 11/16/2013  . Folliculitis 19/37/9024  . Arthritis of left knee 09/14/2013  . Status post total knee replacement 09/14/2013  . Cough 08/15/2013  . Obesity (BMI 30-39.9) 07/17/2013  . Essential hypertension, benign 04/29/2013  . Wheezing 04/29/2013  . Insomnia 04/29/2013  . Unspecified vitamin D deficiency 04/29/2013  . Migraine headache 04/29/2013  . Anxiety state 04/29/2013  . Unspecified hypothyroidism 04/29/2013  . UTI (urinary tract infection) 07/14/2012  . Other and unspecified hyperlipidemia 07/14/2012  . MVP (mitral valve prolapse) 07/14/2012  . GERD (gastroesophageal reflux disease) 07/14/2012  . Allergic rhinitis 07/14/2012  . Mitral regurgitation 07/14/2012  . S/P hysterectomy 07/14/2012    Past Surgical History:  Procedure Laterality Date  . ABDOMINAL HYSTERECTOMY    . CARPAL TUNNEL RELEASE Right   .  CARPAL TUNNEL RELEASE Left 02/25/2017   Procedure: LEFT OPEN CARPAL TUNNEL RELEASE;  Surgeon: [name], MD;  Location: [facility];  Service: Orthopedics;  Laterality: Left;  . COLONOSCOPY    . KNEE ARTHROSCOPY Right 01/06/2018   Procedure: RIGHT KNEE ARTHROSCOPY WITH PARTIAL  MEDIAL MENISCECTOMY;  Surgeon: [name], MD;  Location: [facility];  Service: Orthopedics;  Laterality: Right;  . KNEE SURGERY     . NEPHRECTOMY    . TONSILLECTOMY    . TOTAL KNEE ARTHROPLASTY Left 09/14/2013   Procedure: LEFT TOTAL KNEE ARTHROPLASTY;  Surgeon: [name], MD;  Location: [facility];  Service: Orthopedics;  Laterality: Left;  . TUBAL LIGATION    . WISDOM TOOTH EXTRACTION       OB History   None      Home Medications    Prior to Admission medications   Medication Sig Start Date End Date Taking? Authorizing Provider  acetaminophen (TYLENOL) 500 MG tablet Take 1,000 mg by mouth every 6 (six) hours as needed for moderate pain.    [provider]  albuterol (PROVENTIL HFA;VENTOLIN HFA) 108 (90 Base) MCG/ACT inhaler Inhale 2 puffs into the lungs every 6 (six) hours as needed for wheezing or shortness of breath.    [provider]  Armodafinil 150 MG tablet Take 1 tablet (150 mg total) by mouth daily. Patient taking differently: Take 75 mg by mouth daily as needed (for alertness.).  10/19/17 10/19/18  Breeback, Royetta Car, PA-C  calcium carbonate (TUMS - DOSED IN MG ELEMENTAL CALCIUM) 500 MG chewable tablet Chew 1 tablet by mouth as needed for indigestion or heartburn.    [provider]  Carboxymethylcellul-Glycerin (LUBRICATING EYE DROPS OP) Apply 1 drop to eye daily as needed (dry eyes).    [provider]  cetirizine (ZYRTEC) 10 MG tablet Take 1 tablet (10 mg total) by mouth at bedtime. 01/25/14 12/25/18  Jonathon Resides, MD  Cholecalciferol (VITAMIN D3) 3000 units TABS Take 3,000 Units by mouth 2 (two) times a week. Wednesdays & Saturdays.    [provider]  citalopram (CELEXA) 20 MG tablet Take 1 tablet (20 mg total) by mouth daily. Patient taking differently: Take 10 mg by mouth at bedtime.  10/19/17   Breeback, [name] L, PA-C  diclofenac sodium (VOLTAREN) 1 % GEL Apply 2-4 g topically 4 (four) times daily as needed (for knee pain/discomfort).    [provider]  esomeprazole (NEXIUM) 40 MG capsule TAKE 1 CAPSULE (40 MG TOTAL) BY MOUTH DAILY BEFORE  BREAKFAST. Patient taking differently: Take 40 mg by mouth daily before breakfast. TAKE 1 CAPSULE (40 MG TOTAL) BY MOUTH DAILY BEFORE BREAKFAST. 10/19/17   Breeback, Jade L, PA-C  hydrochlorothiazide (MICROZIDE) 12.5 MG capsule Take 1 capsule (12.5 mg total) by mouth daily. 10/19/17   Breeback, Royetta Car, PA-C  levothyroxine (SYNTHROID, LEVOTHROID) 137 MCG tablet Take 1 tablet (137 mcg total) by mouth daily before breakfast. 10/19/17 10/19/18  Breeback, Jade L, PA-C  lisinopril (PRINIVIL,ZESTRIL) 2.5 MG tablet Take 1 tablet (2.5 mg total) by mouth daily. 10/19/17   Breeback, Jade L, PA-C  naproxen sodium (ANAPROX) 220 MG tablet Take 220-440 mg by mouth daily as needed (pain).    [provider]  Omega-3 Fatty Acids (FISH OIL PO) Take 1 capsule by mouth daily.    [provider]  ondansetron (ZOFRAN) 8 MG tablet Take 8 mg by mouth every 8 (eight) hours as needed for nausea or vomiting.    [provider]  rosuvastatin (CRESTOR) 10 MG tablet Take 1 tablet (10 mg total) by mouth daily. Patient taking differently: Take 10 mg by mouth at bedtime.  10/19/17   Breeback, Royetta Car, PA-C  traMADol (ULTRAM) 50 MG tablet Take 1-2 tablets (50-100 mg total) by mouth every 6 (six) hours as needed (for pain.). 01/06/18   [name], MD    Family History Family History  Problem Relation Age of Onset  . Diabetes Mother   . Hypertension Mother   . Heart disease Father   . Hypertension Father   . Thyroid disease Father   . Cancer Maternal Aunt   . Cancer Maternal Aunt   . Colon cancer Neg Hx   . Esophageal cancer Neg Hx   . Rectal cancer Neg Hx   . Stomach cancer Neg Hx     Social History Social History   Tobacco Use  . Smoking status: Former Smoker    Last attempt to quit: 11/22/1983    Years since quitting: 34.2  . Smokeless tobacco: Never Used  Substance Use Topics  . Alcohol use: Yes    Comment: occasional  . Drug use: No     Allergies   Percocet  [oxycodone-acetaminophen]; Hydrocodone-acetaminophen; Lipitor [atorvastatin]; Saxenda [liraglutide -weight management]; Shrimp [shellfish allergy]; Belviq xr [lorcaserin hcl er]; and Morphine and related   Review of Systems Review of Systems  Constitutional: Negative for activity change, appetite change, chills and fever.  HENT: Negative for congestion.   Eyes: Positive for photophobia.  Respiratory: Negative for cough, chest tightness and shortness of breath.   Cardiovascular: Negative for chest pain and palpitations.  Gastrointestinal: Negative for abdominal pain, constipation, diarrhea, nausea and vomiting.  Genitourinary: Negative for difficulty urinating, dysuria, frequency and hematuria.  Neurological: Positive for dizziness and headaches. Negative for light-headedness and numbness.  All other systems reviewed and are negative.    Physical Exam Updated Vital Signs BP 128/73 (BP Location: Right Arm)   Pulse 66   Resp 15   Ht 5\' 5"  (1.651 m)   Wt 95.3 kg (210 lb)   SpO2 99%   BMI 34.95 kg/m   Physical Exam  Constitutional: She is oriented to person, place, and time. She appears well-developed and well-nourished.  HENT:  Head: Normocephalic and atraumatic.  Eyes: Pupils are equal, round, and reactive to light. EOM are normal.  Cardiovascular: Normal rate, regular rhythm, normal heart sounds and intact distal pulses.  Pulmonary/Chest: Effort normal and breath sounds normal.  Abdominal: Soft. Bowel sounds are normal.  Musculoskeletal:  Decreased ROM of right wrist. Tenderness to palpation of right wrist. Normal ROM of right digits and right elbow. No tenderness to palpation to right digits and right elbow. Tenderness to palpation of right hip. Normal ROM of right hip.   Neurological: She is alert and oriented to person, place, and time.  Skin: Skin is warm and dry.     Psychiatric: She has a normal mood and affect. Her behavior is normal.     ED Treatments / Results    Labs (all labs ordered are listed, but only abnormal results are displayed) Labs Reviewed - No data to display  EKG None  Radiology No results found.  Procedures Procedures (including critical care time)  Medications Ordered in ED Medications - No data to display   Initial Impression / Assessment and Plan / ED Course  I have reviewed the triage vital signs and the nursing notes.  Pertinent labs & imaging results that were available during  my care of the patient were reviewed by me and considered in my medical decision making (see chart for details).  62 y.o. female with no significant past medical history who presents after a mechanical fall.  #Dorsal triquetrum fracture - Right wrist x-ray shows dorsal triquetrum fracture. - Volar forearm splint placed today. - Recommended to follow up with hand surgeon.   #Headache and vertigo - Head CT and cervical spine CT unremarkable. Patient symptoms are likely secondary to a mild traumatic brain injury/concussion. - Given Zofran and tylenol. - Patient given strict return precautions and she expressed understanding.  #Right hip pain - Right hip X-ray unremarkable - Recommended to ice area and use Tylenol/ibuprofen as needed for pain  #Right knee pain - Right knee x-ray unremarkable - Recommended to ice area and use Tylenol/ibuprofen as needed for pain  #Dispo - Signed out to overnight team - Pending improvement of vertigo and nausea, will likely dispo home.  Final Clinical Impressions(s) / ED Diagnoses   Final diagnoses:  Closed fracture of right hand, initial encounter  Concussion with loss of consciousness of 30 minutes or less, initial encounter  Injury of head, initial encounter  Fall, initial encounter    ED Discharge Orders    None       Carroll Sage, MD 02/16/18 [id number]    Rex Kras Wenda Overland, MD 02/17/18 [phone]

## 2018-02-15 NOTE — ED Triage Notes (Signed)
Per ems pt was at home and taking dogs for a walk and dogs pulled her down about 5 steps and pt stated she hit her head and loc. PT woke up and called for husband. Pt alert oriented x4. Upon ems arrival. Pt is very nauseated  And swimmy headed. Pt was given 4 of zofran. 134/80, 80 hr,

## 2018-02-15 NOTE — Progress Notes (Deleted)
Cardiology Office Note   Date:  02/15/2018   ID:  ELANORE Buckley, DOB 1956/06/26, MRN [id number]  PCP:  Donella Stade, PA-C  Cardiologist: Dr.Skains  No chief complaint on file.    History of Present Illness: [name] is a 62 y.o. female who presents for pre-operative evaluation for  Right partial knee replacement, planned for 02/18/2018. She has not been seen by cardiology since 01/2015. She has a history of hypercholesterolemia, PVC;s, mitral valve prolapse, aortic insufficiency, OSA, thyroid cancer, hypothyroidism.     Past Medical History:  Diagnosis Date  . Acute medial meniscal tear    Right knee  . Allergy   . Anxiety   . Aortic insufficiency    Mild  . Arthritis   . Carpal tunnel syndrome   . Dysrhythmia    pvc's at times  . GERD (gastroesophageal reflux disease)   . Hyperlipidemia   . Hypothyroidism   . Migraine headache   . Mitral regurgitation   . Mitral valve prolapse   . Nausea alone   . Need for SBE (subacute bacterial endocarditis) prophylaxis   . Pilonidal cyst without mention of abscess   . Pneumonia Aug 14 2012  . PONV (postoperative nausea and vomiting)    states she vasovagals  . Sleep apnea    wears CPAP  . Thyroid disease   . Transplanted kidney removed    donated kidney    Past Surgical History:  Procedure Laterality Date  . ABDOMINAL HYSTERECTOMY    . CARPAL TUNNEL RELEASE Right   . CARPAL TUNNEL RELEASE Left 02/25/2017   Procedure: LEFT OPEN CARPAL TUNNEL RELEASE;  Surgeon: [name], MD;  Location: [facility];  Service: Orthopedics;  Laterality: Left;  . COLONOSCOPY    . KNEE ARTHROSCOPY Right 01/06/2018   Procedure: RIGHT KNEE ARTHROSCOPY WITH PARTIAL  MEDIAL MENISCECTOMY;  Surgeon: [name], MD;  Location: [facility];  Service: Orthopedics;  Laterality: Right;  . KNEE SURGERY    . NEPHRECTOMY    . TONSILLECTOMY    . TOTAL KNEE ARTHROPLASTY Left 09/14/2013   Procedure: LEFT TOTAL KNEE ARTHROPLASTY;  Surgeon:  [name], MD;  Location: [facility];  Service: Orthopedics;  Laterality: Left;  . TUBAL LIGATION    . WISDOM TOOTH EXTRACTION       Current Outpatient Medications  Medication Sig Dispense Refill  . acetaminophen (TYLENOL) 500 MG tablet Take 1,000 mg by mouth every 6 (six) hours as needed for moderate pain.    Marland Kitchen albuterol (PROVENTIL HFA;VENTOLIN HFA) 108 (90 Base) MCG/ACT inhaler Inhale 2 puffs into the lungs every 6 (six) hours as needed for wheezing or shortness of breath.    . Armodafinil 150 MG tablet Take 1 tablet (150 mg total) by mouth daily. (Patient taking differently: Take 75 mg by mouth daily as needed (for alertness.). ) 90 tablet 1  . calcium carbonate (TUMS - DOSED IN MG ELEMENTAL CALCIUM) 500 MG chewable tablet Chew 1 tablet by mouth as needed for indigestion or heartburn.    . Carboxymethylcellul-Glycerin (LUBRICATING EYE DROPS OP) Apply 1 drop to eye daily as needed (dry eyes).    . cetirizine (ZYRTEC) 10 MG tablet Take 1 tablet (10 mg total) by mouth at bedtime. 90 tablet 3  . Cholecalciferol (VITAMIN D3) 3000 units TABS Take 3,000 Units by mouth 2 (two) times a week. Wednesdays & Saturdays.    . citalopram (CELEXA) 20 MG tablet Take 1 tablet (20 mg total) by mouth daily. (Patient  taking differently: Take 10 mg by mouth at bedtime. ) 90 tablet 3  . diclofenac sodium (VOLTAREN) 1 % GEL Apply 2-4 g topically 4 (four) times daily as needed (for knee pain/discomfort).    Marland Kitchen esomeprazole (NEXIUM) 40 MG capsule TAKE 1 CAPSULE (40 MG TOTAL) BY MOUTH DAILY BEFORE BREAKFAST. (Patient taking differently: Take 40 mg by mouth daily before breakfast. TAKE 1 CAPSULE (40 MG TOTAL) BY MOUTH DAILY BEFORE BREAKFAST.) 90 capsule 3  . hydrochlorothiazide (MICROZIDE) 12.5 MG capsule Take 1 capsule (12.5 mg total) by mouth daily. 90 capsule 3  . levothyroxine (SYNTHROID, LEVOTHROID) 137 MCG tablet Take 1 tablet (137 mcg total) by mouth daily before breakfast. 30 tablet 11  . lisinopril  (PRINIVIL,ZESTRIL) 2.5 MG tablet Take 1 tablet (2.5 mg total) by mouth daily. 90 tablet 3  . naproxen sodium (ANAPROX) 220 MG tablet Take 220-440 mg by mouth daily as needed (pain).    . Omega-3 Fatty Acids (FISH OIL PO) Take 1 capsule by mouth daily.    . ondansetron (ZOFRAN) 8 MG tablet Take 8 mg by mouth every 8 (eight) hours as needed for nausea or vomiting.    . rosuvastatin (CRESTOR) 10 MG tablet Take 1 tablet (10 mg total) by mouth daily. (Patient taking differently: Take 10 mg by mouth at bedtime. ) 90 tablet 3  . traMADol (ULTRAM) 50 MG tablet Take 1-2 tablets (50-100 mg total) by mouth every 6 (six) hours as needed (for pain.). 50 tablet 0   No current facility-administered medications for this visit.     Allergies:   Percocet [oxycodone-acetaminophen]; Hydrocodone-acetaminophen; Lipitor [atorvastatin]; Saxenda [liraglutide -weight management]; Shrimp [shellfish allergy]; Belviq xr [lorcaserin hcl er]; and Morphine and related    Social History:  The patient  reports that she quit smoking about 34 years ago. She has never used smokeless tobacco. She reports that she drinks alcohol. She reports that she does not use drugs.   Family History:  The patient's family history includes Cancer in her maternal aunt and maternal aunt; Diabetes in her mother; Heart disease in her father; Hypertension in her father and mother; Thyroid disease in her father.    ROS: All other systems are reviewed and negative. Unless otherwise mentioned in H&P    PHYSICAL EXAM: VS:  There were no vitals taken for this visit. , BMI There is no height or weight on file to calculate BMI. GEN: Well nourished, well developed, in no acute distress  HEENT: normal  Neck: no JVD, carotid bruits, or masses Cardiac: ***RRR; no murmurs, rubs, or gallops,no edema  Respiratory:  clear to auscultation bilaterally, normal work of breathing GI: soft, nontender, nondistended, + BS MS: no deformity or atrophy  Skin: warm and  dry, no rash Neuro:  Strength and sensation are intact Psych: euthymic mood, full affect   EKG:  EKG {ACTION; IS/IS NID:[id number]} ordered today. The ekg ordered today demonstrates ***   Recent Labs: 07/12/2017: ALT 25; TSH 0.56 12/28/2017: Hemoglobin 13.9; Platelets 380 02/14/2018: BUN 18; Creatinine, Ser 1.10; Potassium 4.2; Sodium 142    Lipid Panel    Component Value Date/Time   CHOL 174 07/12/2017 0920   TRIG 239 (H) 07/12/2017 0920   HDL 53 07/12/2017 0920   CHOLHDL 3.3 07/12/2017 0920   VLDL 22 12/20/2014 0922   LDLCALC 88 07/12/2017 0920      Wt Readings from Last 3 Encounters:  01/06/18 218 lb (98.9 kg)  12/28/17 218 lb 9.6 oz (99.2 kg)  11/24/17 221 lb (100.2  kg)      Other studies Reviewed: Additional studies/ records that were reviewed today include: ***. Review of the above records demonstrates: ***   ASSESSMENT AND PLAN:  1.  ***   Current medicines are reviewed at length with the patient today.    Labs/ tests ordered today include: *** Phill Myron. [place] Pugh, ANP, AACC   02/15/2018 1:31 PM    [facility] [address], [place], [address] [address] Phone: [phone]; Fax: [phone]

## 2018-02-16 ENCOUNTER — Ambulatory Visit: Payer: 59 | Admitting: Adult Health

## 2018-02-16 DIAGNOSIS — S060X1A Concussion with loss of consciousness of 30 minutes or less, initial encounter: Secondary | ICD-10-CM | POA: Diagnosis not present

## 2018-02-16 DIAGNOSIS — Z79899 Other long term (current) drug therapy: Secondary | ICD-10-CM | POA: Diagnosis not present

## 2018-02-16 DIAGNOSIS — Z87891 Personal history of nicotine dependence: Secondary | ICD-10-CM | POA: Diagnosis not present

## 2018-02-16 DIAGNOSIS — E039 Hypothyroidism, unspecified: Secondary | ICD-10-CM | POA: Diagnosis not present

## 2018-02-16 DIAGNOSIS — S6291XA Unspecified fracture of right wrist and hand, initial encounter for closed fracture: Secondary | ICD-10-CM | POA: Diagnosis not present

## 2018-02-16 DIAGNOSIS — I10 Essential (primary) hypertension: Secondary | ICD-10-CM | POA: Diagnosis not present

## 2018-02-16 MED ORDER — PROMETHAZINE HCL 25 MG/ML IJ SOLN
6.2500 mg | Freq: Once | INTRAMUSCULAR | Status: AC
  Administered 2018-02-16: 6.25 mg via INTRAVENOUS
  Filled 2018-02-16: qty 1

## 2018-02-16 NOTE — Discharge Instructions (Addendum)
You were diagnosed with a dorsal triquetrum fracture. This is a fracture to a bone in your right hand. Please use the splint. You should also follow-up with a hand surgeon within the next 1-2 weeks. We have provided this information in your discharge instructions.  Apply ice on affected joints to treat pain and inflammation. Please take 2 Tylenol 500 mg tablets as needed for your wrist pain. You can also take 2 Ibuprofen 400 mg tablets every 6 hours as needed for pain.  You also likely suffered from a concussion after your fall. A concussion is a mild brain injury that can cause confusion, memory loss, and headache. Sometimes people pass out (lose consciousness) when they have a concussion, but not always. A concussion does not usually need treatment. Most concussions get better on their own, but it can take time. Some people's symptoms go away within minutes to hours. Other people have symptoms for weeks to months. When symptoms last a long time, doctors call it "postconcussion syndrome."  The following warning signs should prompt you or your caregiver to seek immediate medical help: ? Inability to awaken the patient at time of expected wakening ? Severe or worsening headaches ? Somnolence or confusion ? Restlessness, unsteadiness, or seizures ? Difficulties with vision ? Vomiting, fever, or stiff neck ? Urinary or bowel incontinence ? Weakness or numbness involving any part of the body

## 2018-02-16 NOTE — Progress Notes (Signed)
Orthopedic Tech Progress Note Patient Details:  [name] Apr 25, 1956 [id number]  Ortho Devices Type of Ortho Device: Arm sling, Volar splint Ortho Device/Splint Location: rue Ortho Device/Splint Interventions: Ordered, Application, Adjustment   Post Interventions Patient Tolerated: Well Instructions Provided: Care of device, Adjustment of device   [name] 02/16/2018, 1:03 AM

## 2018-02-18 ENCOUNTER — Ambulatory Visit (HOSPITAL_BASED_OUTPATIENT_CLINIC_OR_DEPARTMENT_OTHER): Admission: RE | Admit: 2018-02-18 | Payer: 59 | Source: Ambulatory Visit | Admitting: Orthopedic Surgery

## 2018-02-18 ENCOUNTER — Ambulatory Visit (INDEPENDENT_AMBULATORY_CARE_PROVIDER_SITE_OTHER): Payer: 59 | Admitting: Physician Assistant

## 2018-02-18 ENCOUNTER — Encounter: Payer: Self-pay | Admitting: Physician Assistant

## 2018-02-18 VITALS — BP 126/65 | HR 70 | Ht 65.0 in | Wt 213.0 lb

## 2018-02-18 DIAGNOSIS — S060X1D Concussion with loss of consciousness of 30 minutes or less, subsequent encounter: Secondary | ICD-10-CM | POA: Diagnosis not present

## 2018-02-18 DIAGNOSIS — H8111 Benign paroxysmal vertigo, right ear: Secondary | ICD-10-CM

## 2018-02-18 DIAGNOSIS — W19XXXD Unspecified fall, subsequent encounter: Secondary | ICD-10-CM | POA: Diagnosis not present

## 2018-02-18 SURGERY — ARTHROPLASTY, KNEE, UNICOMPARTMENTAL
Anesthesia: Choice | Site: Knee | Laterality: Right

## 2018-02-18 MED ORDER — MECLIZINE HCL 25 MG PO TABS: 25.0000 mg | ORAL_TABLET | Freq: Three times a day (TID) | ORAL | 0 refills | Status: DC | PRN

## 2018-02-18 MED FILL — MECLIZINE 25 MG TABLET: 25 | 30 days supply | Qty: 90 | Fill #0

## 2018-02-18 NOTE — Progress Notes (Signed)
Subjective:    Patient ID: [name], female    DOB: [birth year], 62 y.o.   MRN: [id number]  HPI  Pt is a 62 yo female to follow up after a mechanical fall involving her dog on 02/15/18 that resulted in a concussion with LOC,  triquetral fracture and dizziness. CT of head negative for any bleed.  Her dizziness has improved some from after fall but still very present and still has to hold on to something to walk. Any sudden movements cause dizziness. She feels like the room is spinning if she looks anywhere fast. Some nausea. She is not vomiting. She denies any headache.   .. Active Ambulatory Problems    Diagnosis Date Noted  . UTI (urinary tract infection) 07/14/2012  . Other and unspecified hyperlipidemia 07/14/2012  . MVP (mitral valve prolapse) 07/14/2012  . GERD (gastroesophageal reflux disease) 07/14/2012  . Allergic rhinitis 07/14/2012  . Mitral regurgitation 07/14/2012  . S/P hysterectomy 07/14/2012  . Essential hypertension, benign 04/29/2013  . Wheezing 04/29/2013  . Insomnia 04/29/2013  . Unspecified vitamin D deficiency 04/29/2013  . Migraine headache 04/29/2013  . Anxiety state 04/29/2013  . Unspecified hypothyroidism 04/29/2013  . Obesity (BMI 30-39.9) 07/17/2013  . Cough 08/15/2013  . Arthritis of left knee 09/14/2013  . Status post total knee replacement 09/14/2013  . Abdominal pain, chronic, right upper quadrant 11/16/2013  . Candidiasis of female genitalia 11/16/2013  . Elevated liver enzymes 11/16/2013  . Folliculitis 63/89/3734  . Nausea alone 11/18/2013  . Abnormal transaminases 11/18/2013  . Hyperlipidemia 11/22/2014  . Single kidney 11/22/2014  . Hypothyroidism 11/22/2014  . OSA (obstructive sleep apnea) 12/23/2015  . Daytime sleepiness 01/24/2017  . Carpal tunnel syndrome, left upper limb 02/25/2017  . Status post carpal tunnel release 03/11/2017  . Hypertriglyceridemia 07/14/2017  . Elevated fasting glucose 07/14/2017  . Chronic pain of right  knee 09/13/2017  . Unilateral primary osteoarthritis, right knee 09/27/2017  . Pre-diabetes 10/19/2017  . Pain in thumb joint with movement of right hand 10/19/2017  . Class 2 obesity due to excess calories without serious comorbidity with body mass index (BMI) of 36.0 to 36.9 in adult 10/19/2017  . Other meniscus derangements, posterior horn of medial meniscus, right knee 11/24/2017  . Pain of right thumb 11/24/2017  . Trigger thumb, right thumb 11/24/2017  . BPPV (benign paroxysmal positional vertigo), right 02/20/2018  . Concussion wth loss of consciousness of 30 minutes or less 02/20/2018  . Fall 02/20/2018   Resolved Ambulatory Problems    Diagnosis Date Noted  . Hypothyroidism 07/14/2012   Past Medical History:  Diagnosis Date  . Acute medial meniscal tear   . Allergy   . Anxiety   . Aortic insufficiency   . Arthritis   . Carpal tunnel syndrome   . Dysrhythmia   . GERD (gastroesophageal reflux disease)   . Hyperlipidemia   . Hypothyroidism   . Migraine headache   . Mitral regurgitation   . Mitral valve prolapse   . Nausea alone   . Need for SBE (subacute bacterial endocarditis) prophylaxis   . Pilonidal cyst without mention of abscess   . Pneumonia Aug 14 2012  . PONV (postoperative nausea and vomiting)   . Sleep apnea   . Thyroid disease   . Transplanted kidney removed      Review of Systems    see HPI.  Objective:   Physical Exam  Constitutional: She is oriented to person, place, and time. She appears well-developed.  HENT:  Head: Normocephalic.  Eyes: Pupils are equal, round, and reactive to light. Conjunctivae are normal.  Dizziness with EOM.  Neck: Normal range of motion. Neck supple.  Cardiovascular: Normal rate, regular rhythm and normal heart sounds.  Pulmonary/Chest: Effort normal and breath sounds normal.  Lymphadenopathy:    She has no cervical adenopathy.  Neurological: She is alert and oriented to person, place, and time. She displays normal  reflexes. A cranial nerve deficit is present. Coordination abnormal.  Abnormal gait. Not able to walk heel to toe.  + dixhallpike to the right.   Psychiatric: She has a normal mood and affect. Her behavior is normal.          Assessment & Plan:   Marland KitchenMarland KitchenRionna was seen today for dizziness and nausea.  Diagnoses and all orders for this visit:  BPPV (benign paroxysmal positional vertigo), right -     meclizine (ANTIVERT) 25 MG tablet; Take 1 tablet (25 mg total) by mouth 3 (three) times daily as needed for dizziness.  Concussion with loss of consciousness of 30 minutes or less, subsequent encounter  Fall, subsequent encounter   Likely BPPV is coming from complication of concussion. Discussed timeline of concussion and then need for physical/mental rest. Written out of work through Monday. Given Antivert and went over how to do the epley maneuver. Encouraged to do 3 repititions 3-4 times a day over the weekend. Could consider PT on Monday if persistent. Discussed hydration. Follow up with any new or worsening symptoms.   Marland Kitchen.Spent 30 minutes with patient and greater than 50 percent of visit spent counseling patient regarding treatment plan.

## 2018-02-18 NOTE — Patient Instructions (Signed)

## 2018-02-20 ENCOUNTER — Encounter: Payer: Self-pay | Admitting: Physician Assistant

## 2018-02-20 DIAGNOSIS — S060X1A Concussion with loss of consciousness of 30 minutes or less, initial encounter: Secondary | ICD-10-CM | POA: Insufficient documentation

## 2018-02-20 DIAGNOSIS — H8111 Benign paroxysmal vertigo, right ear: Secondary | ICD-10-CM | POA: Insufficient documentation

## 2018-02-20 DIAGNOSIS — W19XXXA Unspecified fall, initial encounter: Secondary | ICD-10-CM | POA: Insufficient documentation

## 2018-02-21 ENCOUNTER — Telehealth: Payer: Self-pay | Admitting: Physician Assistant

## 2018-02-21 DIAGNOSIS — M25531 Pain in right wrist: Secondary | ICD-10-CM | POA: Diagnosis not present

## 2018-02-21 DIAGNOSIS — H8111 Benign paroxysmal vertigo, right ear: Secondary | ICD-10-CM

## 2018-02-21 DIAGNOSIS — S62113A Displaced fracture of triquetrum [cuneiform] bone, unspecified wrist, initial encounter for closed fracture: Secondary | ICD-10-CM | POA: Insufficient documentation

## 2018-02-21 DIAGNOSIS — S62114A Nondisplaced fracture of triquetrum [cuneiform] bone, right wrist, initial encounter for closed fracture: Secondary | ICD-10-CM | POA: Diagnosis not present

## 2018-02-21 NOTE — Telephone Encounter (Signed)
Where does she want to do vestibular rehab at? [facility] [place] ?   Ok to write her out of work for the next week.

## 2018-02-21 NOTE — Telephone Encounter (Signed)
Patient calls office today and states you told her to call back on Monday if vertigo was not better and she states that it is not better at all. Please advise. KG LPN

## 2018-02-22 NOTE — Telephone Encounter (Signed)
Patient called and staets would prefer [place] area closest to [employer] area if possible for the vestibular rehab. KG LPN

## 2018-02-22 NOTE — Telephone Encounter (Signed)
LM on VM to return call for instructions. KG LPN

## 2018-02-22 NOTE — Telephone Encounter (Signed)
Pt returned call and LM on my VM. Tried calling patient back and got her VM. LM to return call of where she would like to have the vestibular rehab done at . KG LPN

## 2018-02-22 NOTE — Telephone Encounter (Signed)
Can you complete this ASAP for vestibular rehab. Referral in chart.

## 2018-02-23 NOTE — Telephone Encounter (Signed)
Done

## 2018-03-03 ENCOUNTER — Ambulatory Visit: Payer: 59 | Attending: Physician Assistant | Admitting: Physical Therapy

## 2018-03-03 DIAGNOSIS — H8112 Benign paroxysmal vertigo, left ear: Secondary | ICD-10-CM | POA: Diagnosis not present

## 2018-03-03 DIAGNOSIS — R42 Dizziness and giddiness: Secondary | ICD-10-CM | POA: Diagnosis not present

## 2018-03-03 NOTE — Therapy (Signed)
[facility] [address] [place], [place], [address] Phone: [phone]   Fax:  [phone]  Physical Therapy Evaluation  Patient Details  Name: [name] MRN: [id number] Date of Birth: 04/01/1956 Referring Provider: Iran Planas   Encounter Date: 03/03/2018  PT End of Session - 03/03/18 1231    Visit Number  1    Date for PT Re-Evaluation  05/04/18    PT Start Time  1055    PT Stop Time  1150    PT Time Calculation (min)  55 min    Activity Tolerance  Patient tolerated treatment well    Behavior During Therapy  [facility] for tasks assessed/performed       Past Medical History:  Diagnosis Date  . Acute medial meniscal tear    Right knee  . Allergy   . Anxiety   . Aortic insufficiency    Mild  . Arthritis   . Carpal tunnel syndrome   . Dysrhythmia    pvc's at times  . GERD (gastroesophageal reflux disease)   . Hyperlipidemia   . Hypothyroidism   . Migraine headache   . Mitral regurgitation   . Mitral valve prolapse   . Nausea alone   . Need for SBE (subacute bacterial endocarditis) prophylaxis   . Pilonidal cyst without mention of abscess   . Pneumonia Aug 14 2012  . PONV (postoperative nausea and vomiting)    states she vasovagals  . Sleep apnea    wears CPAP  . Thyroid disease   . Transplanted kidney removed    donated kidney    Past Surgical History:  Procedure Laterality Date  . ABDOMINAL HYSTERECTOMY    . CARPAL TUNNEL RELEASE Right   . CARPAL TUNNEL RELEASE Left 02/25/2017   Procedure: LEFT OPEN CARPAL TUNNEL RELEASE;  Surgeon: [name], MD;  Location: [place];  Service: Orthopedics;  Laterality: Left;  . COLONOSCOPY    . KNEE ARTHROSCOPY Right 01/06/2018   Procedure: RIGHT KNEE ARTHROSCOPY WITH PARTIAL  MEDIAL MENISCECTOMY;  Surgeon: [name], MD;  Location: [facility];  Service: Orthopedics;  Laterality: Right;  . KNEE SURGERY    . NEPHRECTOMY    . TONSILLECTOMY     . TOTAL KNEE ARTHROPLASTY Left 09/14/2013   Procedure: LEFT TOTAL KNEE ARTHROPLASTY;  Surgeon: [name], MD;  Location: [facility];  Service: Orthopedics;  Laterality: Left;  . TUBAL LIGATION    . WISDOM TOOTH EXTRACTION      There were no vitals filed for this visit.   Subjective Assessment - 03/03/18 1102    Subjective  Patient reports that she was knocked over by a dog fell off her deck, reports a loss of consciousness, woke up with severe vertigo, July 9th.  Reports 24 hours of severe symptoms.  Then some decreased in symptoms, but reports that sudden motions or bending over will increase her symptoms    Currently in Pain?  No/denies         [facility] PT Assessment - 03/03/18 0001      Assessment   Medical Diagnosis  BPPV    Referring Provider  Iran Planas    Onset Date/Surgical Date  02/15/18    Prior Therapy  for knee surgery      Precautions   Precautions  None      Balance Screen   Has the patient fallen in the past 6 months  Yes    How many times?  1    Has the patient had a decrease in activity level because of a fear of falling?   No    Is the patient reluctant to leave their home because of a fear of falling?   No      Home Environment   Additional Comments  stairs, does yardwork and housework      Prior Function   Level of Independence  Independent    Vocation  Full time employment    Development worker, [employer]    Leisure  walks           Vestibular Assessment - 03/03/18 0001      Symptom Behavior   Type of Dizziness  Spinning    Frequency of Dizziness  daily    Duration of Dizziness  5 minutes    Aggravating Factors  Turning head quickly;Forward bending      Occulomotor Exam   Occulomotor Alignment  Normal    Spontaneous  Absent    Smooth Pursuits  Intact      Positional Testing   Dix-Hallpike  Dix-Hallpike Left      Dix-Hallpike Left   Dix-Hallpike Left Duration  20 seconds    Dix-Hallpike Left  Symptoms  Upbeat, right rotatory nystagmus          Objective measurements completed on examination: See above findings.       Vestibular Treatment/Exercise - 03/03/18 0001      Vestibular Treatment/Exercise   Vestibular Treatment Provided  Canalith Repositioning    Canalith Repositioning  Epley Manuever Left    Gaze Exercises  X1 Viewing Horizontal;X1 Viewing Vertical;X2 Viewing Horizontal;Eye/Head Exercise Horizontal;Eye/Head Exercise Vertical       EPLEY MANUEVER LEFT   Number of Reps   1    Overall Response   Improved Symptoms            PT Education - 03/03/18 1157    Person(s) Educated  Patient;Child(ren)  (Pended)     Methods  Explanation;Demonstration;Handout  (Pended)     Comprehension  Verbalized understanding  (Pended)           PT Long Term Goals - 03/03/18 1238      PT LONG TERM GOAL #1   Title  independent with HEP    Time  8    Period  Weeks    Status  New      PT LONG TERM GOAL #2   Title  decrease symptoms 75%    Time  8    Period  Weeks    Status  New             Plan - 03/03/18 1231    Clinical Impression Statement  Patient fell off of her deck on 02/15/2018, she hit her head, and when she woke up she had severe vertigo for a few days.  She reports now less symptoms but difficulty bending over and any quick motions.  + Dix-Halpike on the left, completed left Epley maneuver with good results, symptoms lasted > 20 seconds for each posistion.  Has some low moter VOR.    Clinical Presentation  Evolving    Clinical Decision Making  Low    Rehab Potential  Good    PT Frequency  1x / week    PT Duration  8 weeks    PT Treatment/Interventions  ADLs/Self Care Home Management;Neuromuscular re-education;Balance training;Patient/family education;Vestibular;Visual/perceptual remediation/compensation    PT Next Visit Plan  gave HEP for Eply,  VOR and Brandt-Daroff    Consulted and Agree with Plan of Care  Patient       Patient will benefit  from skilled therapeutic intervention in order to improve the following deficits and impairments:  Dizziness, Decreased activity tolerance, Decreased balance  Visit Diagnosis: BPPV (benign paroxysmal positional vertigo), left - Plan: PT plan of care cert/re-cert  Dizziness and giddiness - Plan: PT plan of care cert/re-cert     Problem List Patient Active Problem List   Diagnosis Date Noted  . BPPV (benign paroxysmal positional vertigo), right 02/20/2018  . Concussion wth loss of consciousness of 30 minutes or less 02/20/2018  . Fall 02/20/2018  . Other meniscus derangements, posterior horn of medial meniscus, right knee 11/24/2017  . Pain of right thumb 11/24/2017  . Trigger thumb, right thumb 11/24/2017  . Pre-diabetes 10/19/2017  . Pain in thumb joint with movement of right hand 10/19/2017  . Class 2 obesity due to excess calories without serious comorbidity with body mass index (BMI) of 36.0 to 36.9 in adult 10/19/2017  . Unilateral primary osteoarthritis, right knee 09/27/2017  . Chronic pain of right knee 09/13/2017  . Hypertriglyceridemia 07/14/2017  . Elevated fasting glucose 07/14/2017  . Status post carpal tunnel release 03/11/2017  . Carpal tunnel syndrome, left upper limb 02/25/2017  . Daytime sleepiness 01/24/2017  . OSA (obstructive sleep apnea) 12/23/2015  . Hyperlipidemia 11/22/2014  . Single kidney 11/22/2014  . Hypothyroidism 11/22/2014  . Nausea alone 11/18/2013  . Abnormal transaminases 11/18/2013  . Abdominal pain, chronic, right upper quadrant 11/16/2013  . Candidiasis of female genitalia 11/16/2013  . Elevated liver enzymes 11/16/2013  . Folliculitis 49/67/5916  . Arthritis of left knee 09/14/2013  . Status post total knee replacement 09/14/2013  . Cough 08/15/2013  . Obesity (BMI 30-39.9) 07/17/2013  . Essential hypertension, benign 04/29/2013  . Wheezing 04/29/2013  . Insomnia 04/29/2013  . Unspecified vitamin D deficiency 04/29/2013  . Migraine  headache 04/29/2013  . Anxiety state 04/29/2013  . Unspecified hypothyroidism 04/29/2013  . UTI (urinary tract infection) 07/14/2012  . Other and unspecified hyperlipidemia 07/14/2012  . MVP (mitral valve prolapse) 07/14/2012  . GERD (gastroesophageal reflux disease) 07/14/2012  . Allergic rhinitis 07/14/2012  . Mitral regurgitation 07/14/2012  . S/P hysterectomy 07/14/2012    Sumner Boast., PT 03/03/2018, 12:40 PM  [facility] [address] [place], [place], [address] Phone: [phone]   Fax:  [phone]  Name: [name] MRN: [id number] Date of Birth: October 07, 1955

## 2018-03-06 ENCOUNTER — Encounter: Payer: Self-pay | Admitting: Physician Assistant

## 2018-03-08 ENCOUNTER — Encounter: Payer: Self-pay | Admitting: Physician Assistant

## 2018-03-08 ENCOUNTER — Ambulatory Visit (INDEPENDENT_AMBULATORY_CARE_PROVIDER_SITE_OTHER): Payer: 59 | Admitting: Physician Assistant

## 2018-03-08 VITALS — BP 117/74 | HR 75 | Ht 65.0 in | Wt 218.0 lb

## 2018-03-08 DIAGNOSIS — H8111 Benign paroxysmal vertigo, right ear: Secondary | ICD-10-CM

## 2018-03-08 NOTE — Progress Notes (Signed)
Subjective:    Patient ID: [name], female    DOB: [birth year], 62 y.o.   MRN: [id number]  HPI  Pt is a 62 yo female with recent BPPV after fall and right triquetral fracture. She was sent to PT and after one session the dizziness/nausea has resolved. She has been asymptomatic since July 25th. Her fall was July 9th and has been out of work since July 10th. She is ready to go back to work. She is not taking any antivert at this time.   .. Active Ambulatory Problems    Diagnosis Date Noted  . UTI (urinary tract infection) 07/14/2012  . Other and unspecified hyperlipidemia 07/14/2012  . MVP (mitral valve prolapse) 07/14/2012  . GERD (gastroesophageal reflux disease) 07/14/2012  . Allergic rhinitis 07/14/2012  . Mitral regurgitation 07/14/2012  . S/P hysterectomy 07/14/2012  . Essential hypertension, benign 04/29/2013  . Wheezing 04/29/2013  . Insomnia 04/29/2013  . Unspecified vitamin D deficiency 04/29/2013  . Migraine headache 04/29/2013  . Anxiety state 04/29/2013  . Unspecified hypothyroidism 04/29/2013  . Obesity (BMI 30-39.9) 07/17/2013  . Cough 08/15/2013  . Arthritis of left knee 09/14/2013  . Status post total knee replacement 09/14/2013  . Abdominal pain, chronic, right upper quadrant 11/16/2013  . Candidiasis of female genitalia 11/16/2013  . Elevated liver enzymes 11/16/2013  . Folliculitis 44/96/7591  . Nausea alone 11/18/2013  . Abnormal transaminases 11/18/2013  . Hyperlipidemia 11/22/2014  . Single kidney 11/22/2014  . Hypothyroidism 11/22/2014  . OSA (obstructive sleep apnea) 12/23/2015  . Daytime sleepiness 01/24/2017  . Carpal tunnel syndrome, left upper limb 02/25/2017  . Status post carpal tunnel release 03/11/2017  . Hypertriglyceridemia 07/14/2017  . Elevated fasting glucose 07/14/2017  . Chronic pain of right knee 09/13/2017  . Unilateral primary osteoarthritis, right knee 09/27/2017  . Pre-diabetes 10/19/2017  . Pain in thumb joint with  movement of right hand 10/19/2017  . Class 2 obesity due to excess calories without serious comorbidity with body mass index (BMI) of 36.0 to 36.9 in adult 10/19/2017  . Other meniscus derangements, posterior horn of medial meniscus, right knee 11/24/2017  . Pain of right thumb 11/24/2017  . Trigger thumb, right thumb 11/24/2017  . BPPV (benign paroxysmal positional vertigo), right 02/20/2018  . Concussion wth loss of consciousness of 30 minutes or less 02/20/2018  . Fall 02/20/2018   Resolved Ambulatory Problems    Diagnosis Date Noted  . Hypothyroidism 07/14/2012   Past Medical History:  Diagnosis Date  . Acute medial meniscal tear   . Allergy   . Anxiety   . Aortic insufficiency   . Arthritis   . Carpal tunnel syndrome   . Dysrhythmia   . GERD (gastroesophageal reflux disease)   . Hyperlipidemia   . Hypothyroidism   . Migraine headache   . Mitral regurgitation   . Mitral valve prolapse   . Nausea alone   . Need for SBE (subacute bacterial endocarditis) prophylaxis   . Pilonidal cyst without mention of abscess   . Pneumonia Aug 14 2012  . PONV (postoperative nausea and vomiting)   . Sleep apnea   . Thyroid disease   . Transplanted kidney removed     Review of Systems See HPI.     Objective:   Physical Exam  Constitutional: She is oriented to person, place, and time. She appears well-developed and well-nourished.  HENT:  Head: Normocephalic and atraumatic.  Cardiovascular: Normal rate and regular rhythm.  Neurological: She is alert and oriented  to person, place, and time. No cranial nerve deficit.  No dizziness with any positional changes. Normal gait.   Psychiatric: She has a normal mood and affect. Her behavior is normal.          Assessment & Plan:  Marland KitchenMarland KitchenDiagnoses and all orders for this visit:  BPPV (benign paroxysmal positional vertigo), right  Other orders -     albuterol (PROVENTIL HFA;VENTOLIN HFA) 108 (90 Base) MCG/ACT inhaler; Inhale 2 puffs into  the lungs every 6 (six) hours as needed for wheezing or shortness of breath.   Written note to go back to work tomorrow with no restrictions and full duty. Call with any changes in symptoms. Will fill out FMLA paperwork when sent. I discussed I had not seen any paperwork regarding patient.

## 2018-03-09 ENCOUNTER — Encounter: Payer: Self-pay | Admitting: Physician Assistant

## 2018-03-09 MED ORDER — ALBUTEROL SULFATE HFA 108 (90 BASE) MCG/ACT IN AERS: 2.0000 | INHALATION_SPRAY | Freq: Four times a day (QID) | RESPIRATORY_TRACT | 5 refills | Status: DC | PRN

## 2018-03-09 MED FILL — VENTOLIN HFA 90 MCG INHALER: 108 (90 BAS | 30 days supply | Qty: 18 | Fill #0

## 2018-03-14 MED FILL — LEVOTHYROXINE 137 MCG TAB: 137 | 30 days supply | Qty: 30 | Fill #5

## 2018-03-21 DIAGNOSIS — S62114D Nondisplaced fracture of triquetrum [cuneiform] bone, right wrist, subsequent encounter for fracture with routine healing: Secondary | ICD-10-CM | POA: Diagnosis not present

## 2018-03-21 DIAGNOSIS — M25531 Pain in right wrist: Secondary | ICD-10-CM | POA: Diagnosis not present

## 2018-03-23 ENCOUNTER — Telehealth: Payer: Self-pay | Admitting: Cardiology

## 2018-03-23 ENCOUNTER — Other Ambulatory Visit: Payer: Self-pay | Admitting: Orthopedic Surgery

## 2018-03-23 NOTE — Telephone Encounter (Signed)
LVM for pt to let her know to come into the office at 0800 for a pre op clearance per Dr Marlou Porch.

## 2018-03-23 NOTE — Telephone Encounter (Signed)
New Message:   Per patient she spoke personally to Dr. Marlou Porch to put her on his schedule tomorrow morning  for surgical clearance.    There is NO available slots to schedule this patient please advise.

## 2018-03-24 ENCOUNTER — Ambulatory Visit (INDEPENDENT_AMBULATORY_CARE_PROVIDER_SITE_OTHER): Payer: 59 | Admitting: Cardiology

## 2018-03-24 ENCOUNTER — Encounter: Payer: Self-pay | Admitting: Cardiology

## 2018-03-24 ENCOUNTER — Other Ambulatory Visit: Payer: Self-pay

## 2018-03-24 ENCOUNTER — Encounter (HOSPITAL_BASED_OUTPATIENT_CLINIC_OR_DEPARTMENT_OTHER): Payer: Self-pay | Admitting: *Deleted

## 2018-03-24 ENCOUNTER — Ambulatory Visit: Payer: 59 | Admitting: Cardiology

## 2018-03-24 ENCOUNTER — Encounter (HOSPITAL_BASED_OUTPATIENT_CLINIC_OR_DEPARTMENT_OTHER)
Admission: RE | Admit: 2018-03-24 | Discharge: 2018-03-24 | Disposition: A | Discharge status: Home / Self Care | Payer: 59 | Source: Ambulatory Visit | Attending: Orthopedic Surgery | Admitting: Orthopedic Surgery

## 2018-03-24 VITALS — BP 126/72 | HR 68 | Ht 65.0 in | Wt 222.0 lb

## 2018-03-24 DIAGNOSIS — M1711 Unilateral primary osteoarthritis, right knee: Secondary | ICD-10-CM | POA: Diagnosis not present

## 2018-03-24 DIAGNOSIS — Z01810 Encounter for preprocedural cardiovascular examination: Secondary | ICD-10-CM | POA: Diagnosis not present

## 2018-03-24 DIAGNOSIS — I351 Nonrheumatic aortic (valve) insufficiency: Secondary | ICD-10-CM

## 2018-03-24 DIAGNOSIS — I493 Ventricular premature depolarization: Secondary | ICD-10-CM | POA: Diagnosis not present

## 2018-03-24 DIAGNOSIS — Z01818 Encounter for other preprocedural examination: Secondary | ICD-10-CM | POA: Insufficient documentation

## 2018-03-24 DIAGNOSIS — I10 Essential (primary) hypertension: Secondary | ICD-10-CM | POA: Diagnosis not present

## 2018-03-24 LAB — BASIC METABOLIC PANEL
ANION GAP: 7 (ref 5–15)
BUN: 16 mg/dL (ref 8–23)
CALCIUM: 9.7 mg/dL (ref 8.9–10.3)
CO2: 28 mmol/L (ref 22–32)
Chloride: 104 mmol/L (ref 98–111)
Creatinine, Ser: 1.09 mg/dL — ABNORMAL HIGH (ref 0.44–1.00)
GFR calc non Af Amer: 53 mL/min — ABNORMAL LOW (ref >60)
Glucose, Bld: 108 mg/dL — ABNORMAL HIGH (ref 70–99)
Potassium: 4.9 mmol/L (ref 3.5–5.1)
Sodium: 139 mmol/L (ref 135–145)

## 2018-03-24 LAB — SURGICAL PCR SCREEN
MRSA, PCR: NEGATIVE
STAPHYLOCOCCUS AUREUS: NEGATIVE

## 2018-03-24 MED ORDER — MUPIROCIN 2 % EX OINT: 1.0000 "application " | TOPICAL_OINTMENT | Freq: Two times a day (BID) | CUTANEOUS | Status: AC

## 2018-03-24 NOTE — Progress Notes (Signed)
Pt went to Dr. Marlou Porch for cardiac clearance this morning.

## 2018-03-24 NOTE — Patient Instructions (Signed)
Medication Instructions:  The current medical regimen is effective;  continue present plan and medications.  Follow-Up: Follow up in 2 years with Dr. Marlou Porch.  You will receive a letter in the mail 2 months before you are due.  Please call us when you receive this letter to schedule your follow up appointment.  If you need a refill on your cardiac medications before your next appointment, please call your pharmacy.  Thank you for choosing [facility]!!

## 2018-03-24 NOTE — Progress Notes (Signed)
Cardiology Office Note:    Date:  03/24/2018   ID:  [name], DOB 11-11-1955, MRN [id number]  PCP:  Tanya Stade, PA-C  Cardiologist:  Tanya Furbish, MD   Referring MD: Tanya Stade, PA-C     History of Present Illness:    [name] is a 62 y.o. female here for preoperative for stratification prior to right knee arthroplasty at the request of Dr. Marchia Buckley.   She was seen previously by me greater than 3 years ago for mitral regurgitation.  She has had this murmur for several years.  She is also had palpitations which correlated with PVCs on monitor.  She is Mudlogger of endoscopy at hospital.  Has been on Crestor for hyperlipidemia (at one point took Zetia only).  She has battled with vertigo.  Had physical therapy, one session, dizziness and nausea resolved.  She was asymptomatic since July 25.  She had a fall on July 9 and had been out of work since July 10.  She suffered a right triquetral fracture.  Feel off deck, dog pulled off. Fell. Concussions.   Past Medical History:  Diagnosis Date  . Acute medial meniscal tear    Right knee  . Allergy   . Anxiety   . Aortic insufficiency    Mild  . Arthritis   . Carpal tunnel syndrome   . Dysrhythmia    pvc's at times  . GERD (gastroesophageal reflux disease)   . Hyperlipidemia   . Hypothyroidism   . Migraine headache   . Mitral regurgitation   . Mitral valve prolapse   . Nausea alone   . Need for SBE (subacute bacterial endocarditis) prophylaxis   . Pilonidal cyst without mention of abscess   . Pneumonia Aug 14 2012  . PONV (postoperative nausea and vomiting)    states she vasovagals  . Sleep apnea    wears CPAP  . Thyroid disease   . Transplanted kidney removed    donated kidney    Past Surgical History:  Procedure Laterality Date  . ABDOMINAL HYSTERECTOMY    . CARPAL TUNNEL RELEASE Right   . CARPAL TUNNEL RELEASE Left 02/25/2017   Procedure: LEFT OPEN CARPAL TUNNEL RELEASE;  Surgeon: Tanya Rossetti, MD;  Location: [facility];  Service: Orthopedics;  Laterality: Left;  . COLONOSCOPY    . KNEE ARTHROSCOPY Right 01/06/2018   Procedure: RIGHT KNEE ARTHROSCOPY WITH PARTIAL  MEDIAL MENISCECTOMY;  Surgeon: Tanya Rossetti, MD;  Location: [facility];  Service: Orthopedics;  Laterality: Right;  . KNEE SURGERY    . NEPHRECTOMY    . TONSILLECTOMY    . TOTAL KNEE ARTHROPLASTY Left 09/14/2013   Procedure: LEFT TOTAL KNEE ARTHROPLASTY;  Surgeon: Tanya Rossetti, MD;  Location: [facility];  Service: Orthopedics;  Laterality: Left;  . TUBAL LIGATION    . WISDOM TOOTH EXTRACTION      Current Medications: Current Meds  Medication Sig  . acetaminophen (TYLENOL) 500 MG tablet Take 1,000 mg by mouth every 6 (six) hours as needed for moderate pain.  Marland Kitchen albuterol (PROVENTIL HFA;VENTOLIN HFA) 108 (90 Base) MCG/ACT inhaler Inhale 2 puffs into the lungs every 6 (six) hours as needed for wheezing or shortness of breath.  . Armodafinil 150 MG tablet Take 1 tablet (150 mg total) by mouth daily.  . calcium carbonate (TUMS - DOSED IN MG ELEMENTAL CALCIUM) 500 MG chewable tablet Chew 1 tablet by mouth as needed for indigestion or heartburn.  . Carboxymethylcellul-Glycerin (LUBRICATING  EYE DROPS OP) Apply 1 drop to eye daily as needed (dry eyes).  . cetirizine (ZYRTEC) 10 MG tablet Take 1 tablet (10 mg total) by mouth at bedtime.  . Cholecalciferol (VITAMIN D3) 3000 units TABS Take 3,000 Units by mouth 2 (two) times a week. Wednesdays & Saturdays.  . citalopram (CELEXA) 20 MG tablet Take 10 mg by mouth daily.  . diclofenac sodium (VOLTAREN) 1 % GEL Apply 2-4 g topically 4 (four) times daily as needed (for knee pain/discomfort).  Marland Kitchen esomeprazole (NEXIUM) 40 MG capsule TAKE 1 CAPSULE (40 MG TOTAL) BY MOUTH DAILY BEFORE BREAKFAST.  . hydrochlorothiazide (MICROZIDE) 12.5 MG capsule Take 1 capsule (12.5 mg total) by mouth daily.  Marland Kitchen levothyroxine (SYNTHROID, LEVOTHROID) 137 MCG tablet Take 1 tablet (137 mcg  total) by mouth daily before breakfast.  . lisinopril (PRINIVIL,ZESTRIL) 2.5 MG tablet Take 1 tablet (2.5 mg total) by mouth daily.  . naproxen sodium (ANAPROX) 220 MG tablet Take 220-440 mg by mouth daily as needed (pain).  . Omega-3 Fatty Acids (FISH OIL PO) Take 1 capsule by mouth daily.  . ondansetron (ZOFRAN) 8 MG tablet Take 8 mg by mouth every 8 (eight) hours as needed for nausea or vomiting.  . rosuvastatin (CRESTOR) 10 MG tablet Take 1 tablet (10 mg total) by mouth daily.     Allergies:   Percocet [oxycodone-acetaminophen]; Hydrocodone-acetaminophen; Lipitor [atorvastatin]; Saxenda [liraglutide -weight management]; Shrimp [shellfish allergy]; Belviq xr [lorcaserin hcl er]; and Morphine and related   Social History   Socioeconomic History  . Marital status: Married    Spouse name: [name]  . Number of children: 4  . Years of education: 14+  . Highest education level: Not on file  Occupational History    Employer: [employer]  . Financial resource strain: Not on file  . Food insecurity:    Worry: Not on file    Inability: Not on file  . Transportation needs:    Medical: Not on file    Non-medical: Not on file  Tobacco Use  . Smoking status: Former Smoker    Last attempt to quit: 11/22/1983    Years since quitting: 34.3  . Smokeless tobacco: Never Used  Substance and Sexual Activity  . Alcohol use: Yes    Comment: occasional  . Drug use: No  . Sexual activity: Yes    Partners: Male  Lifestyle  . Physical activity:    Days per week: Not on file    Minutes per session: Not on file  . Stress: Not on file  Relationships  . Social connections:    Talks on phone: Not on file    Gets together: Not on file    Attends religious service: Not on file    Active member of club or organization: Not on file    Attends meetings of clubs or organizations: Not on file    Relationship status: Not on file  Other Topics Concern  . Not on file  Social History Narrative     Marital Status:  Married Marine scientist)   Children: 4   Pets:  Dogs (2)   Living Situation: Lives with spouse, son and his family.     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 [employer]     Education:  Forensic psychologist; MSN    Tobacco Use/Exposure: None   Alcohol Use: Rarely   Drug Use:  None   Diet:  Low Fat    Exercise:  Limited (Walking Dogs)    Hobbies:  Reading, Movies               Family History: The patient's family history includes Cancer in her maternal aunt and maternal aunt; Diabetes in her mother; Heart disease in her father; Hypertension in her father and mother; Thyroid disease in her father. There is no history of Colon cancer, Esophageal cancer, Rectal cancer, or Stomach cancer.  ROS:   Please see the history of present illness.     All other systems reviewed and are negative.  EKGs/Labs/Other Studies Reviewed:    The following studies were reviewed today:  Echocardiogram 01/18/2015: - Left ventricle: The cavity size was normal. Wall thickness was   increased in a pattern of mild LVH. Systolic function was normal.   The estimated ejection fraction was in the range of 60% to 65%.   Wall motion was normal; there were no regional wall motion   abnormalities. The study is not technically sufficient to allow   evaluation of LV diastolic function. - Aortic valve: Sclerosis without stenosis. There was mild   regurgitation. - Mitral valve: Poorly visualized. There was no significant   regurgitation. - Left atrium: The atrium was mildly dilated at 36 ml/m2.  Impressions:  - LVEF 60-65%, mild LVH, normal wall motion, cannot estimate   diastolic function, mild AI, poorly visualized mitral valve,   mildly dialted LA.  EKG: 02/16/2028-sinus rhythm 68 mild interventricular conduction delay personally viewed.  Recent Labs: 07/12/2017: ALT 25; TSH 0.56 12/28/2017: Hemoglobin 13.9; Platelets 380 02/14/2018: BUN 18; Creatinine, Ser 1.10; Potassium 4.2; Sodium 142   Recent Lipid Panel    Component Value Date/Time   CHOL 174 07/12/2017 0920   TRIG 239 (H) 07/12/2017 0920   HDL 53 07/12/2017 0920   CHOLHDL 3.3 07/12/2017 0920   VLDL 22 12/20/2014 0922   LDLCALC 88 07/12/2017 0920    Physical Exam:    VS:  BP 126/72   Pulse 68   Ht 5\' 5"  (1.651 m)   Wt 222 lb (100.7 kg)   SpO2 95%   BMI 36.94 kg/m     Wt Readings from Last 3 Encounters:  03/24/18 222 lb (100.7 kg)  03/08/18 218 lb (98.9 kg)  02/18/18 213 lb (96.6 kg)     GEN:  Well nourished, well developed in no acute distress HEENT: Normal NECK: No JVD; No carotid bruits LYMPHATICS: No lymphadenopathy CARDIAC: RRR, 1/6 systolic right upper sternal border murmur, no rubs, gallops RESPIRATORY:  Clear to auscultation without rales, wheezing or rhonchi  ABDOMEN: Soft, non-tender, non-distended MUSCULOSKELETAL:  No edema; No deformity  SKIN: Warm and dry NEUROLOGIC:  Alert and oriented x 3 PSYCHIATRIC:  Normal affect   ASSESSMENT:    1. Pre-operative cardiovascular examination   2. Nonrheumatic aortic valve insufficiency   3. Essential hypertension   4. PVC (premature ventricular contraction)    PLAN:    In order of problems listed above:  Preoperative cardiovascular risk stratification - She is able to complete greater than 4 METS of activity without any anginal symptoms.  No heart failure symptoms, no severe valvular abnormalities or arrhythmias.  She may proceed with knee arthroplasty with low overall cardiac risk  Mitral regurgitation/aortic regurgitation - Prior echocardiogram in 2016 showed no significant mitral regurgitation and only mild aortic insufficiency.  Reassuring.  This should not be of any clinical significance.  Subtle murmur heard on exam.  Likely will check echocardiogram again in about 2 years.  Palpitations -These have previously been detected as PVCs on monitor  in the past.  Continue with conservative management.  Essential hypertension - Medications  reviewed.  Stable.  Hyperlipidemia - Back to taking Crestor 10 mg.  Excellent.  Continue with primary prevention.  We will see back in 2 years.  Likely check echocardiogram at that time  Medication Adjustments/Labs and Tests Ordered: Current medicines are reviewed at length with the patient today.  Concerns regarding medicines are outlined above.  No orders of the defined types were placed in this encounter.  No orders of the defined types were placed in this encounter.   Patient Instructions  Medication Instructions:  The current medical regimen is effective;  continue present plan and medications.  Follow-Up: Follow up in 2 years with Dr. Marlou Porch.  You will receive a letter in the mail 2 months before you are due.  Please call us when you receive this letter to schedule your follow up appointment.  If you need a refill on your cardiac medications before your next appointment, please call your pharmacy.  Thank you for choosing [facility]!!        Signed, Tanya Furbish, MD  03/24/2018 8:40 AM    [place]

## 2018-03-28 MED FILL — HYDROCHLOROTHIAZIDE 12.5 MG: 12.5 | 90 days supply | Qty: 90 | Fill #1

## 2018-03-31 ENCOUNTER — Ambulatory Visit (HOSPITAL_BASED_OUTPATIENT_CLINIC_OR_DEPARTMENT_OTHER)
Admission: RE | Admit: 2018-03-31 | Discharge: 2018-03-31 | Disposition: A | Discharge status: Home / Self Care | Payer: 59 | Source: Ambulatory Visit | Attending: Orthopedic Surgery | Admitting: Orthopedic Surgery

## 2018-03-31 ENCOUNTER — Other Ambulatory Visit: Payer: Self-pay

## 2018-03-31 ENCOUNTER — Ambulatory Visit (HOSPITAL_BASED_OUTPATIENT_CLINIC_OR_DEPARTMENT_OTHER): Payer: 59 | Admitting: Anesthesiology

## 2018-03-31 ENCOUNTER — Encounter (HOSPITAL_BASED_OUTPATIENT_CLINIC_OR_DEPARTMENT_OTHER): Payer: Self-pay | Admitting: *Deleted

## 2018-03-31 ENCOUNTER — Ambulatory Visit (HOSPITAL_COMMUNITY): Payer: 59

## 2018-03-31 ENCOUNTER — Encounter (HOSPITAL_BASED_OUTPATIENT_CLINIC_OR_DEPARTMENT_OTHER)
Admission: RE | Disposition: A | Discharge status: Home / Self Care | Payer: Self-pay | Source: Ambulatory Visit | Attending: Orthopedic Surgery

## 2018-03-31 DIAGNOSIS — G4730 Sleep apnea, unspecified: Secondary | ICD-10-CM | POA: Diagnosis not present

## 2018-03-31 DIAGNOSIS — E785 Hyperlipidemia, unspecified: Secondary | ICD-10-CM | POA: Diagnosis not present

## 2018-03-31 DIAGNOSIS — G8918 Other acute postprocedural pain: Secondary | ICD-10-CM | POA: Diagnosis not present

## 2018-03-31 DIAGNOSIS — Z96652 Presence of left artificial knee joint: Secondary | ICD-10-CM | POA: Diagnosis not present

## 2018-03-31 DIAGNOSIS — I10 Essential (primary) hypertension: Secondary | ICD-10-CM | POA: Diagnosis not present

## 2018-03-31 DIAGNOSIS — Z905 Acquired absence of kidney: Secondary | ICD-10-CM | POA: Insufficient documentation

## 2018-03-31 DIAGNOSIS — G43909 Migraine, unspecified, not intractable, without status migrainosus: Secondary | ICD-10-CM | POA: Insufficient documentation

## 2018-03-31 DIAGNOSIS — I080 Rheumatic disorders of both mitral and aortic valves: Secondary | ICD-10-CM | POA: Diagnosis not present

## 2018-03-31 DIAGNOSIS — E669 Obesity, unspecified: Secondary | ICD-10-CM | POA: Insufficient documentation

## 2018-03-31 DIAGNOSIS — I493 Ventricular premature depolarization: Secondary | ICD-10-CM | POA: Insufficient documentation

## 2018-03-31 DIAGNOSIS — E039 Hypothyroidism, unspecified: Secondary | ICD-10-CM | POA: Insufficient documentation

## 2018-03-31 DIAGNOSIS — Z6836 Body mass index (BMI) 36.0-36.9, adult: Secondary | ICD-10-CM | POA: Insufficient documentation

## 2018-03-31 DIAGNOSIS — Z90710 Acquired absence of both cervix and uterus: Secondary | ICD-10-CM | POA: Insufficient documentation

## 2018-03-31 DIAGNOSIS — Z87891 Personal history of nicotine dependence: Secondary | ICD-10-CM | POA: Diagnosis not present

## 2018-03-31 DIAGNOSIS — K219 Gastro-esophageal reflux disease without esophagitis: Secondary | ICD-10-CM | POA: Insufficient documentation

## 2018-03-31 DIAGNOSIS — N289 Disorder of kidney and ureter, unspecified: Secondary | ICD-10-CM | POA: Diagnosis not present

## 2018-03-31 DIAGNOSIS — M1711 Unilateral primary osteoarthritis, right knee: Secondary | ICD-10-CM | POA: Diagnosis not present

## 2018-03-31 DIAGNOSIS — F419 Anxiety disorder, unspecified: Secondary | ICD-10-CM | POA: Diagnosis not present

## 2018-03-31 DIAGNOSIS — Z9889 Other specified postprocedural states: Secondary | ICD-10-CM

## 2018-03-31 HISTORY — DX: Chronic kidney disease, unspecified: N18.9

## 2018-03-31 HISTORY — DX: Nonrheumatic aortic (valve) insufficiency: I35.1

## 2018-03-31 HISTORY — PX: PARTIAL KNEE ARTHROPLASTY: SHX2174

## 2018-03-31 SURGERY — ARTHROPLASTY, KNEE, UNICOMPARTMENTAL
Anesthesia: General | Site: Knee | Laterality: Right

## 2018-03-31 MED ORDER — KETOROLAC TROMETHAMINE 30 MG/ML IJ SOLN
30.0000 mg | Freq: Four times a day (QID) | INTRAMUSCULAR | Status: DC | PRN
  Administered 2018-03-31: 30 mg via INTRAVENOUS

## 2018-03-31 MED ORDER — GABAPENTIN 300 MG PO CAPS
ORAL_CAPSULE | ORAL | Status: AC
  Filled 2018-03-31: qty 1

## 2018-03-31 MED ORDER — FENTANYL CITRATE (PF) 100 MCG/2ML IJ SOLN
INTRAMUSCULAR | Status: AC
  Filled 2018-03-31: qty 2

## 2018-03-31 MED ORDER — DEXAMETHASONE SODIUM PHOSPHATE 10 MG/ML IJ SOLN
INTRAMUSCULAR | Status: DC | PRN
  Administered 2018-03-31: 10 mg via INTRAVENOUS

## 2018-03-31 MED ORDER — SENNA-DOCUSATE SODIUM 8.6-50 MG PO TABS: 2.0000 | ORAL_TABLET | Freq: Every day | ORAL | 1 refills | Status: DC

## 2018-03-31 MED ORDER — BACLOFEN 10 MG PO TABS: 10.0000 mg | ORAL_TABLET | Freq: Three times a day (TID) | ORAL | 0 refills | Status: DC

## 2018-03-31 MED ORDER — CEFAZOLIN SODIUM-DEXTROSE 2-4 GM/100ML-% IV SOLN
INTRAVENOUS | Status: AC
  Filled 2018-03-31: qty 100

## 2018-03-31 MED ORDER — GABAPENTIN 300 MG PO CAPS
300.0000 mg | ORAL_CAPSULE | Freq: Once | ORAL | Status: AC
Start: 2018-03-31 — End: 2018-03-31
  Administered 2018-03-31: 300 mg via ORAL

## 2018-03-31 MED ORDER — FENTANYL CITRATE (PF) 100 MCG/2ML IJ SOLN
25.0000 ug | INTRAMUSCULAR | Status: DC | PRN
  Administered 2018-03-31 (×3): 50 ug via INTRAVENOUS

## 2018-03-31 MED ORDER — ONDANSETRON HCL 8 MG PO TABS: 8.0000 mg | ORAL_TABLET | Freq: Three times a day (TID) | ORAL | 0 refills | Status: DC | PRN

## 2018-03-31 MED ORDER — LACTATED RINGERS IV SOLN
INTRAVENOUS | Status: DC
  Administered 2018-03-31 (×3): via INTRAVENOUS

## 2018-03-31 MED ORDER — SODIUM CHLORIDE 0.9 % IR SOLN
Status: DC | PRN
  Administered 2018-03-31: 1000 mL

## 2018-03-31 MED ORDER — SCOPOLAMINE 1 MG/3DAYS TD PT72
MEDICATED_PATCH | TRANSDERMAL | Status: AC
  Filled 2018-03-31: qty 1

## 2018-03-31 MED ORDER — ONDANSETRON HCL 4 MG/2ML IJ SOLN
INTRAMUSCULAR | Status: DC | PRN
  Administered 2018-03-31: 4 mg via INTRAVENOUS

## 2018-03-31 MED ORDER — HYDROCODONE-ACETAMINOPHEN 10-325 MG PO TABS: 1.0000 | ORAL_TABLET | Freq: Four times a day (QID) | ORAL | 0 refills | Status: DC | PRN

## 2018-03-31 MED ORDER — ROPIVACAINE HCL 7.5 MG/ML IJ SOLN
INTRAMUSCULAR | Status: DC | PRN
  Administered 2018-03-31: 20 mL via PERINEURAL

## 2018-03-31 MED ORDER — ACETAMINOPHEN 500 MG PO TABS
1000.0000 mg | ORAL_TABLET | Freq: Once | ORAL | Status: AC
  Administered 2018-03-31: 1000 mg via ORAL

## 2018-03-31 MED ORDER — FENTANYL CITRATE (PF) 100 MCG/2ML IJ SOLN
50.0000 ug | INTRAMUSCULAR | Status: AC | PRN
  Administered 2018-03-31 (×2): 25 ug via INTRAVENOUS
  Administered 2018-03-31 (×2): 50 ug via INTRAVENOUS

## 2018-03-31 MED ORDER — ASPIRIN EC 325 MG PO TBEC: 325.0000 mg | DELAYED_RELEASE_TABLET | Freq: Two times a day (BID) | ORAL | 0 refills | Status: DC

## 2018-03-31 MED ORDER — CHLORHEXIDINE GLUCONATE 4 % EX LIQD: 60.0000 mL | Freq: Once | CUTANEOUS | Status: DC

## 2018-03-31 MED ORDER — LIDOCAINE 2% (20 MG/ML) 5 ML SYRINGE
INTRAMUSCULAR | Status: DC | PRN
  Administered 2018-03-31: 100 mg via INTRAVENOUS

## 2018-03-31 MED ORDER — MIDAZOLAM HCL 2 MG/2ML IJ SOLN
INTRAMUSCULAR | Status: AC
  Filled 2018-03-31: qty 2

## 2018-03-31 MED ORDER — SCOPOLAMINE 1 MG/3DAYS TD PT72
1.0000 | MEDICATED_PATCH | Freq: Once | TRANSDERMAL | Status: DC
  Administered 2018-03-31: 1.5 mg via TRANSDERMAL

## 2018-03-31 MED ORDER — CEFAZOLIN SODIUM-DEXTROSE 2-4 GM/100ML-% IV SOLN
2.0000 g | INTRAVENOUS | Status: AC
  Administered 2018-03-31: 2 g via INTRAVENOUS

## 2018-03-31 MED ORDER — EPHEDRINE SULFATE 50 MG/ML IJ SOLN
INTRAMUSCULAR | Status: DC | PRN
  Administered 2018-03-31: 10 mg via INTRAVENOUS

## 2018-03-31 MED ORDER — MIDAZOLAM HCL 2 MG/2ML IJ SOLN
1.0000 mg | INTRAMUSCULAR | Status: DC | PRN
  Administered 2018-03-31: 2 mg via INTRAVENOUS

## 2018-03-31 MED ORDER — KETOROLAC TROMETHAMINE 30 MG/ML IJ SOLN
INTRAMUSCULAR | Status: AC
  Filled 2018-03-31: qty 1

## 2018-03-31 MED ORDER — SCOPOLAMINE 1 MG/3DAYS TD PT72: 1.0000 | MEDICATED_PATCH | Freq: Once | TRANSDERMAL | Status: DC | PRN

## 2018-03-31 MED ORDER — ACETAMINOPHEN 500 MG PO TABS
ORAL_TABLET | ORAL | Status: AC
  Filled 2018-03-31: qty 1

## 2018-03-31 MED ORDER — ONDANSETRON HCL 4 MG/2ML IJ SOLN
INTRAMUSCULAR | Status: AC
  Filled 2018-03-31: qty 2

## 2018-03-31 MED ORDER — PROPOFOL 10 MG/ML IV BOLUS
INTRAVENOUS | Status: DC | PRN
  Administered 2018-03-31: 200 mg via INTRAVENOUS

## 2018-03-31 MED ORDER — DEXAMETHASONE SODIUM PHOSPHATE 10 MG/ML IJ SOLN
INTRAMUSCULAR | Status: AC
  Filled 2018-03-31: qty 1

## 2018-03-31 MED ORDER — ONDANSETRON HCL 4 MG/2ML IJ SOLN: 4.0000 mg | Freq: Once | INTRAMUSCULAR | Status: DC | PRN

## 2018-03-31 MED FILL — HYDROCODON-APAP 10-325: 10-325 | 7 days supply | Qty: 28 | Fill #0

## 2018-03-31 MED FILL — ONDANSETRON HCL 8 MG TAB: 8 | 6 days supply | Qty: 20 | Fill #0

## 2018-03-31 MED FILL — ASPIRIN EC 325 MG TABLET: 325 | 30 days supply | Qty: 60 | Fill #0

## 2018-03-31 MED FILL — SM STOOL SOFTENER-LAXATIVE: 8.6-50 | 50 days supply | Qty: 100 | Fill #0

## 2018-03-31 MED FILL — BACLOFEN 10 MG TABLET: 10 | 16 days supply | Qty: 50 | Fill #0

## 2018-03-31 SURGICAL SUPPLY — 66 items
BANDAGE ACE 6X5 VEL STRL LF (GAUZE/BANDAGES/DRESSINGS) ×3 IMPLANT
BANDAGE ESMARK 6X9 LF (GAUZE/BANDAGES/DRESSINGS) ×1 IMPLANT
BLADE SURG 10 STRL SS (BLADE) ×6 IMPLANT
BLADE SURG 15 STRL LF DISP TIS (BLADE) ×2 IMPLANT
BLADE SURG 15 STRL SS (BLADE) ×4
BNDG ESMARK 6X9 LF (GAUZE/BANDAGES/DRESSINGS) ×3
BOWL SMART MIX CTS (DISPOSABLE) ×3 IMPLANT
CANISTER SUCTION 2500CC (MISCELLANEOUS) ×3 IMPLANT
CEMENT BONE R 1X40 (Cement) ×3 IMPLANT
CLOSURE STERI-STRIP 1/2X4 (GAUZE/BANDAGES/DRESSINGS) ×1
CLSR STERI-STRIP ANTIMIC 1/2X4 (GAUZE/BANDAGES/DRESSINGS) ×2 IMPLANT
COVER BACK TABLE 60X90IN (DRAPES) ×3 IMPLANT
CUFF TOURNIQUET SINGLE 34IN LL (TOURNIQUET CUFF) ×3 IMPLANT
DECANTER SPIKE VIAL GLASS SM (MISCELLANEOUS) IMPLANT
DRAPE EXTREMITY T 121X128X90 (DRAPE) ×3 IMPLANT
DRAPE IMP U-DRAPE 54X76 (DRAPES) ×3 IMPLANT
DRAPE U-SHAPE 47X51 STRL (DRAPES) ×3 IMPLANT
DRSG MEPILEX BORDER 4X8 (GAUZE/BANDAGES/DRESSINGS) ×3 IMPLANT
DURAPREP 26ML APPLICATOR (WOUND CARE) ×3 IMPLANT
ELECT REM PT RETURN 9FT ADLT (ELECTROSURGICAL) ×3
ELECTRODE REM PT RTRN 9FT ADLT (ELECTROSURGICAL) ×1 IMPLANT
FACESHIELD WRAPAROUND (MASK) ×6 IMPLANT
GLOVE BIO SURGEON STRL SZ8 (GLOVE) ×3 IMPLANT
GLOVE BIOGEL PI IND STRL 7.0 (GLOVE) ×2 IMPLANT
GLOVE BIOGEL PI IND STRL 8 (GLOVE) ×2 IMPLANT
GLOVE BIOGEL PI INDICATOR 7.0 (GLOVE) ×4
GLOVE BIOGEL PI INDICATOR 8 (GLOVE) ×4
GLOVE ECLIPSE 6.5 STRL STRAW (GLOVE) ×6 IMPLANT
GLOVE ORTHO TXT STRL SZ7.5 (GLOVE) ×3 IMPLANT
GOWN STRL REUS W/ TWL LRG LVL3 (GOWN DISPOSABLE) ×1 IMPLANT
GOWN STRL REUS W/ TWL XL LVL3 (GOWN DISPOSABLE) ×2 IMPLANT
GOWN STRL REUS W/TWL LRG LVL3 (GOWN DISPOSABLE) ×2
GOWN STRL REUS W/TWL XL LVL3 (GOWN DISPOSABLE) ×4
HANDPIECE INTERPULSE COAX TIP (DISPOSABLE) ×2
IMMOBILIZER KNEE 22 UNIV (SOFTGOODS) ×3 IMPLANT
IMMOBILIZER KNEE 24 THIGH 36 (MISCELLANEOUS) IMPLANT
IMMOBILIZER KNEE 24 UNIV (MISCELLANEOUS)
KNEE OXFORD UNI MENISCAL (Joint) ×3 IMPLANT
KNEE WRAP E Z 3 GEL PACK (MISCELLANEOUS) ×3 IMPLANT
MANIFOLD NEPTUNE II (INSTRUMENTS) ×3 IMPLANT
NS IRRIG 1000ML POUR BTL (IV SOLUTION) ×3 IMPLANT
PACK ARTHROSCOPY DSU (CUSTOM PROCEDURE TRAY) ×3 IMPLANT
PACK BASIN DAY SURGERY FS (CUSTOM PROCEDURE TRAY) ×3 IMPLANT
PACK BLADE SAW RECIP 70 3 PT (BLADE) ×3 IMPLANT
PEG FEMORAL PEGGED STRL SM (Knees) ×3 IMPLANT
PENCIL BUTTON HOLSTER BLD 10FT (ELECTRODE) ×3 IMPLANT
SET HNDPC FAN SPRY TIP SCT (DISPOSABLE) ×1 IMPLANT
SHEET MEDIUM DRAPE 40X70 STRL (DRAPES) ×3 IMPLANT
SLEEVE SCD COMPRESS KNEE MED (MISCELLANEOUS) ×3 IMPLANT
SPONGE LAP 18X18 RF (DISPOSABLE) ×3 IMPLANT
SUCTION FRAZIER HANDLE 10FR (MISCELLANEOUS) ×2
SUCTION TUBE FRAZIER 10FR DISP (MISCELLANEOUS) ×1 IMPLANT
SUT MNCRL AB 4-0 PS2 18 (SUTURE) IMPLANT
SUT VIC AB 0 CT1 27 (SUTURE) ×2
SUT VIC AB 0 CT1 27XBRD ANBCTR (SUTURE) ×1 IMPLANT
SUT VIC AB 2-0 SH 27 (SUTURE) ×2
SUT VIC AB 2-0 SH 27XBRD (SUTURE) ×1 IMPLANT
SUT VICRYL 3-0 CR8 SH (SUTURE) ×3 IMPLANT
SUT VICRYL 4-0 PS2 18IN ABS (SUTURE) IMPLANT
SYR BULB IRRIGATION 50ML (SYRINGE) ×3 IMPLANT
TOWEL GREEN STERILE FF (TOWEL DISPOSABLE) ×3 IMPLANT
TOWEL OR NON WOVEN STRL DISP B (DISPOSABLE) ×6 IMPLANT
TRAY TIBIAL KNEE OXFORD SZAA (Joint) ×3 IMPLANT
TUBE CONNECTING 20'X1/4 (TUBING)
TUBE CONNECTING 20X1/4 (TUBING) IMPLANT
YANKAUER SUCT BULB TIP NO VENT (SUCTIONS) ×3 IMPLANT

## 2018-03-31 NOTE — Anesthesia Procedure Notes (Signed)
Anesthesia Regional Block: Adductor canal block   Pre-Anesthetic Checklist: ,, timeout performed, Correct Patient, Correct Site, Correct Laterality, Correct Procedure, Correct Position, site marked, Risks and benefits discussed,  Surgical consent,  Pre-op evaluation,  At surgeon's request and post-op pain management  Laterality: Right  Prep: chloraprep       Needles:  Injection technique: Single-shot  Needle Type: Echogenic Needle     Needle Length: 9cm  Needle Gauge: 21     Additional Needles:   Procedures:,,,, ultrasound used (permanent image in chart),,,,  Narrative:  Start time: 03/31/2018 8:49 AM End time: 03/31/2018 8:54 AM Injection made incrementally with aspirations every 5 mL.  Performed by: Personally  Anesthesiologist: [name], MD  Additional Notes: No pain on injection. No increased resistance to injection. Injection made in 5cc increments.  Good needle visualization.  Patient tolerated procedure well.

## 2018-03-31 NOTE — Transfer of Care (Signed)
Immediate Anesthesia Transfer of Care Note  Patient: [name]  Procedure(s) Performed: RIGHT KNEE UNI ARTHROPLASTY (Right Knee)  Patient Location: PACU  Anesthesia Type:GA combined with regional for post-op pain  Level of Consciousness: sedated  Airway & Oxygen Therapy: Patient Spontanous Breathing and Patient connected to face mask oxygen  Post-op Assessment: Report given to RN and Post -op Vital signs reviewed and stable  Post vital signs: Reviewed and stable  Last Vitals:  Vitals Value Taken Time  BP 91/50 03/31/2018 12:22 PM  Temp    Pulse 86 03/31/2018 12:27 PM  Resp 13 03/31/2018 12:27 PM  SpO2 96 % 03/31/2018 12:27 PM  Vitals shown include unvalidated device data.  Last Pain:  Vitals:   03/31/18 0749  TempSrc: Oral  PainSc: 2       Patients Stated Pain Goal: 2 (92/90/90 3014)  Complications: No apparent anesthesia complications

## 2018-03-31 NOTE — Anesthesia Procedure Notes (Signed)
Procedure Name: LMA Insertion Date/Time: 03/31/2018 10:24 AM Performed by: Maryella Shivers, CRNA Pre-anesthesia Checklist: Patient identified, Emergency Drugs available, Suction available and Patient being monitored Patient Re-evaluated:Patient Re-evaluated prior to induction Oxygen Delivery Method: Circle system utilized Preoxygenation: Pre-oxygenation with 100% oxygen Induction Type: IV induction Ventilation: Mask ventilation without difficulty LMA: LMA inserted LMA Size: 4.0 Number of attempts: 1 Airway Equipment and Method: Bite block Placement Confirmation: positive ETCO2 Tube secured with: Tape Dental Injury: Teeth and Oropharynx as per pre-operative assessment

## 2018-03-31 NOTE — Discharge Instructions (Signed)
**You had 1000 MG Tylenol at 0900 **No Ibuprofen/Motrin until 8:15pm  INSTRUCTIONS AFTER JOINT REPLACEMENT   o Remove items at home which could result in a fall. This includes throw rugs or furniture in walking pathways o ICE to the affected joint every three hours while awake for 30 minutes at a time, for at least the first 3-5 days, and then as needed for pain and swelling.  Continue to use ice for pain and swelling. You may notice swelling that will progress down to the foot and ankle.  This is normal after surgery.  Elevate your leg when you are not up walking on it.   o Continue to use the breathing machine you got in the hospital (incentive spirometer) which will help keep your temperature down.  It is common for your temperature to cycle up and down following surgery, especially at night when you are not up moving around and exerting yourself.  The breathing machine keeps your lungs expanded and your temperature down.   DIET:  As you were doing prior to hospitalization, we recommend a well-balanced diet.  DRESSING / WOUND CARE / SHOWERING  You may change your dressing 3-5 days after surgery.  Then change the dressing every day with sterile gauze.  Please use good hand washing techniques before changing the dressing.  Do not use any lotions or creams on the incision until instructed by your surgeon.  ACTIVITY  o Increase activity slowly as tolerated, but follow the weight bearing instructions below.   o No driving for 6 weeks or until further direction given by your physician.  You cannot drive while taking narcotics.  o No lifting or carrying greater than 10 lbs. until further directed by your surgeon. o Avoid periods of inactivity such as sitting longer than an hour when not asleep. This helps prevent blood clots.  o You may return to work once you are authorized by your doctor.     WEIGHT BEARING   Weight bearing as tolerated with assist device (walker, cane, etc) as directed,  use it as long as suggested by your surgeon or therapist, typically at least 4-6 weeks.   EXERCISES  Results after joint replacement surgery are often greatly improved when you follow the exercise, range of motion and muscle strengthening exercises prescribed by your doctor. Safety measures are also important to protect the joint from further injury. Any time any of these exercises cause you to have increased pain or swelling, decrease what you are doing until you are comfortable again and then slowly increase them. If you have problems or questions, call your caregiver or physical therapist for advice.   Rehabilitation is important following a joint replacement. After just a few days of immobilization, the muscles of the leg can become weakened and shrink (atrophy).  These exercises are designed to build up the tone and strength of the thigh and leg muscles and to improve motion. Often times heat used for twenty to thirty minutes before working out will loosen up your tissues and help with improving the range of motion but do not use heat for the first two weeks following surgery (sometimes heat can increase post-operative swelling).   These exercises can be done on a training (exercise) mat, on the floor, on a table or on a bed. Use whatever works the best and is most comfortable for you.    Use music or television while you are exercising so that the exercises are a pleasant break in your day. This  will make your life better with the exercises acting as a break in your routine that you can look forward to.   Perform all exercises about fifteen times, three times per day or as directed.  You should exercise both the operative leg and the other leg as well.  Exercises include:    Quad Sets - Tighten up the muscle on the front of the thigh (Quad) and hold for 5-10 seconds.    Straight Leg Raises - With your knee straight (if you were given a brace, keep it on), lift the leg to 60 degrees, hold for 3  seconds, and slowly lower the leg.  Perform this exercise against resistance later as your leg gets stronger.   Leg Slides: Lying on your back, slowly slide your foot toward your buttocks, bending your knee up off the floor (only go as far as is comfortable). Then slowly slide your foot back down until your leg is flat on the floor again.   Angel Wings: Lying on your back spread your legs to the side as far apart as you can without causing discomfort.   Hamstring Strength:  Lying on your back, push your heel against the floor with your leg straight by tightening up the muscles of your buttocks.  Repeat, but this time bend your knee to a comfortable angle, and push your heel against the floor.  You may put a pillow under the heel to make it more comfortable if necessary.   A rehabilitation program following joint replacement surgery can speed recovery and prevent re-injury in the future due to weakened muscles. Contact your doctor or a physical therapist for more information on knee rehabilitation.    CONSTIPATION  Constipation is defined medically as fewer than three stools per week and severe constipation as less than one stool per week.  Even if you have a regular bowel pattern at home, your normal regimen is likely to be disrupted due to multiple reasons following surgery.  Combination of anesthesia, postoperative narcotics, change in appetite and fluid intake all can affect your bowels.   YOU MUST use at least one of the following options; they are listed in order of increasing strength to get the job done.  They are all available over the counter, and you may need to use some, POSSIBLY even all of these options:    Drink plenty of fluids (prune juice may be helpful) and high fiber foods Colace 100 mg by mouth twice a day  Senokot for constipation as directed and as needed Dulcolax (bisacodyl), take with full glass of water  Miralax (polyethylene glycol) once or twice a day as needed.  If  you have tried all these things and are unable to have a bowel movement in the first 3-4 days after surgery call either your surgeon or your primary doctor.    If you experience loose stools or diarrhea, hold the medications until you stool forms back up.  If your symptoms do not get better within 1 week or if they get worse, check with your doctor.  If you experience "the worst abdominal pain ever" or develop nausea or vomiting, please contact the office immediately for further recommendations for treatment.   ITCHING:  If you experience itching with your medications, try taking only a single pain pill, or even half a pain pill at a time.  You can also use Benadryl over the counter for itching or also to help with sleep.   TED HOSE STOCKINGS:  Use  stockings on both legs until for at least 2 weeks or as directed by physician office. They may be removed at night for sleeping.  MEDICATIONS:  See your medication summary on the After Visit Summary that nursing will review with you.  You may have some home medications which will be placed on hold until you complete the course of blood thinner medication.  It is important for you to complete the blood thinner medication as prescribed.  PRECAUTIONS:  If you experience chest pain or shortness of breath - call 911 immediately for transfer to the hospital emergency department.   If you develop a fever greater that 101 F, purulent drainage from wound, increased redness or drainage from wound, foul odor from the wound/dressing, or calf pain - CONTACT YOUR SURGEON.                                                   FOLLOW-UP APPOINTMENTS:  If you do not already have a post-op appointment, please call the office for an appointment to be seen by your surgeon.  Guidelines for how soon to be seen are listed in your After Visit Summary, but are typically between 1-4 weeks after surgery.  OTHER INSTRUCTIONS:   Knee Replacement:  Do not place pillow under knee,  focus on keeping the knee straight while resting.  MAKE SURE YOU:   Understand these instructions.   Get help right away if you are not doing well or get worse.    Thank you for letting us be a part of your medical care team.  It is a privilege we respect greatly.  We hope these instructions will help you stay on track for a fast and full recovery!    Post Anesthesia Home Care Instructions  Activity: Get plenty of rest for the remainder of the day. A responsible individual must stay with you for 24 hours following the procedure.  For the next 24 hours, DO NOT: -Drive a car -Paediatric nurse -Drink alcoholic beverages -Take any medication unless instructed by your physician -Make any legal decisions or sign important papers.  Meals: Start with liquid foods such as gelatin or soup. Progress to regular foods as tolerated. Avoid greasy, spicy, heavy foods. If nausea and/or vomiting occur, drink only clear liquids until the nausea and/or vomiting subsides. Call your physician if vomiting continues.  Special Instructions/Symptoms: Your throat may feel dry or sore from the anesthesia or the breathing tube placed in your throat during surgery. If this causes discomfort, gargle with warm salt water. The discomfort should disappear within 24 hours.  If you had a scopolamine patch placed behind your ear for the management of post- operative nausea and/or vomiting:  1. The medication in the patch is effective for 72 hours, after which it should be removed.  Wrap patch in a tissue and discard in the trash. Wash hands thoroughly with soap and water. 2. You may remove the patch earlier than 72 hours if you experience unpleasant side effects which may include dry mouth, dizziness or visual disturbances. 3. Avoid touching the patch. Wash your hands with soap and water after contact with the patch.

## 2018-03-31 NOTE — Progress Notes (Signed)
Called Dr. Mardelle Matte to ask about orders for Toradol.  He advised at that time he would put in orders for patient to stay overnight if she feels she needs to stay.  Will evaluate after patient feels her pain is under control.

## 2018-03-31 NOTE — H&P (Signed)
PREOPERATIVE H&P  Chief Complaint: right knee pain  HPI: [name] is a 62 y.o. female who presents for preoperative history and physical with a diagnosis of right knee anteromedial osteoarthritis. Symptoms are rated as moderate to severe, and have been worsening.  This is significantly impairing activities of daily living.  She has elected for surgical management.   She has failed injections, activity modification, anti-inflammatories, and assistive devices, previous arthroscopy.  Preoperative X-rays demonstrate end stage degenerative changes with osteophyte formation, loss of joint space, subchondral sclerosis.   Past Medical History:  Diagnosis Date  . Acute medial meniscal tear    Right knee  . Allergy   . Anxiety   . Aortic insufficiency    Mild  . Aortic regurgitation   . Arthritis   . Carpal tunnel syndrome   . Chronic kidney disease    one kidney   . Dysrhythmia    pvc's at times  . GERD (gastroesophageal reflux disease)   . Hyperlipidemia   . Hypothyroidism   . Migraine headache   . Mitral regurgitation   . Mitral valve prolapse   . Nausea alone   . Need for SBE (subacute bacterial endocarditis) prophylaxis   . Pilonidal cyst without mention of abscess   . Pneumonia Aug 14 2012  . PONV (postoperative nausea and vomiting)    states she vasovagals  . Sleep apnea    wears CPAP  . Thyroid disease   . Transplanted kidney removed    donated kidney   Past Surgical History:  Procedure Laterality Date  . ABDOMINAL HYSTERECTOMY    . CARPAL TUNNEL RELEASE Right   . CARPAL TUNNEL RELEASE Left 02/25/2017   Procedure: LEFT OPEN CARPAL TUNNEL RELEASE;  Surgeon: [name], MD;  Location: [place];  Service: Orthopedics;  Laterality: Left;  . COLONOSCOPY    . KNEE ARTHROSCOPY Right 01/06/2018   Procedure: RIGHT KNEE ARTHROSCOPY WITH PARTIAL  MEDIAL MENISCECTOMY;  Surgeon: [name], MD;  Location: [place];  Service: Orthopedics;  Laterality:  Right;  . KNEE SURGERY    . NEPHRECTOMY    . TONSILLECTOMY    . TOTAL KNEE ARTHROPLASTY Left 09/14/2013   Procedure: LEFT TOTAL KNEE ARTHROPLASTY;  Surgeon: [name], MD;  Location: [place];  Service: Orthopedics;  Laterality: Left;  . TUBAL LIGATION    . WISDOM TOOTH EXTRACTION     Social History   Socioeconomic History  . Marital status: Married    Spouse name: [name]  . Number of children: 4  . Years of education: 14+  . Highest education level: Not on file  Occupational History    Employer: [employer]  . Financial resource strain: Not on file  . Food insecurity:    Worry: Not on file    Inability: Not on file  . Transportation needs:    Medical: Not on file    Non-medical: Not on file  Tobacco Use  . Smoking status: Former Smoker    Last attempt to quit: 11/22/1983    Years since quitting: 34.3  . Smokeless tobacco: Never Used  Substance and Sexual Activity  . Alcohol use: Yes    Comment: occasional  . Drug use: No  . Sexual activity: Yes    Partners: Male  Lifestyle  . Physical activity:    Days per week: Not on file    Minutes per session: Not on file  . Stress: Not on file  Relationships  . Social  connections:    Talks on phone: Not on file    Gets together: Not on file    Attends religious service: Not on file    Active member of club or organization: Not on file    Attends meetings of clubs or organizations: Not on file    Relationship status: Not on file  Other Topics Concern  . Not on file  Social History Narrative   Marital Status:  Married Marine scientist)   Children: 4   Pets:  Dogs (2)   Living Situation: Lives with spouse, son and his family.     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 Diabetes Mother   . Hypertension Mother   . Heart disease Father   . Hypertension Father   . Thyroid disease Father   . Cancer Maternal Aunt   . Cancer Maternal Aunt   . Colon cancer Neg Hx   . Esophageal cancer Neg Hx   . Rectal cancer Neg Hx   . Stomach cancer Neg Hx    Allergies  Allergen Reactions  . Percocet [Oxycodone-Acetaminophen] Other (See Comments)    SYNCOPE TACHYCARDIA  . Hydrocodone-Acetaminophen Nausea And Vomiting and Other (See Comments)    dizziness  . Lipitor [Atorvastatin] Other (See Comments)    Ear pain/fullness in ear "as if I were under water"  . Saxenda [Liraglutide -Weight Management] Other (See Comments)    Hives/itching/site reaction  . Shrimp [Shellfish Allergy] Itching, Nausea And Vomiting and Swelling    SWELLING REACTION UNSPECIFIED   . Belviq Xr [Lorcaserin Hcl Er] Other (See Comments)    Gained weight on eat.   . Morphine And Related Nausea And Vomiting   Prior to Admission medications   Medication Sig Start Date End Date Taking? Authorizing Provider  acetaminophen (TYLENOL) 500 MG tablet Take 1,000 mg by mouth every 6 (six) hours as needed for moderate pain.   Yes [provider]  Armodafinil 150 MG tablet Take 1 tablet (150 mg total) by mouth daily. 10/19/17 10/19/18 Yes Breeback, Jade L, PA-C  Carboxymethylcellul-Glycerin (LUBRICATING EYE DROPS OP) Apply 1 drop to eye daily as needed (dry eyes).   Yes [provider]  cetirizine (ZYRTEC) 10 MG tablet Take 1 tablet (10 mg total) by mouth at bedtime. 01/25/14 12/25/18 Yes Zanard, Bernadene Bell, MD  citalopram (CELEXA) 20 MG tablet Take 10 mg by mouth daily.   Yes [provider]  diclofenac sodium (VOLTAREN) 1 % GEL Apply 2-4 g topically 4 (four) times daily as needed (for knee pain/discomfort).   Yes [provider]  esomeprazole (NEXIUM) 40 MG capsule TAKE 1 CAPSULE (40 MG TOTAL) BY MOUTH DAILY BEFORE BREAKFAST. 10/19/17  Yes  Breeback, Jade L, PA-C  hydrochlorothiazide (MICROZIDE) 12.5 MG capsule Take 1 capsule (12.5 mg total) by mouth daily. 10/19/17  Yes Breeback, Jade L, PA-C  levothyroxine (SYNTHROID, LEVOTHROID) 137 MCG tablet Take 1 tablet (137 mcg total) by mouth daily before breakfast. 10/19/17 10/19/18 Yes Breeback, Jade L, PA-C  lisinopril (PRINIVIL,ZESTRIL) 2.5 MG tablet Take 1 tablet (2.5 mg total) by mouth daily. 10/19/17  Yes Breeback,  Jade L, PA-C  naproxen sodium (ANAPROX) 220 MG tablet Take 220-440 mg by mouth daily as needed (pain).   Yes [provider]  Omega-3 Fatty Acids (FISH OIL PO) Take 1 capsule by mouth daily.   Yes [provider]  rosuvastatin (CRESTOR) 10 MG tablet Take 1 tablet (10 mg total) by mouth daily. 10/19/17  Yes Breeback, Jade L, PA-C  albuterol (PROVENTIL HFA;VENTOLIN HFA) 108 (90 Base) MCG/ACT inhaler Inhale 2 puffs into the lungs every 6 (six) hours as needed for wheezing or shortness of breath. 03/09/18   Breeback, Jade L, PA-C  calcium carbonate (TUMS - DOSED IN MG ELEMENTAL CALCIUM) 500 MG chewable tablet Chew 1 tablet by mouth as needed for indigestion or heartburn.    [provider]  Cholecalciferol (VITAMIN D3) 3000 units TABS Take 3,000 Units by mouth 2 (two) times a week. Wednesdays & Saturdays.    [provider]  ondansetron (ZOFRAN) 8 MG tablet Take 8 mg by mouth every 8 (eight) hours as needed for nausea or vomiting.    [provider]     Positive ROS: All other systems have been reviewed and were otherwise negative with the exception of those mentioned in the HPI and as above.  Physical Exam: General: Alert, no acute distress Cardiovascular: No pedal edema Respiratory: No cyanosis, no use of accessory musculature GI: No organomegaly, abdomen is soft and non-tender Skin: No lesions in the area of chief complaint Neurologic: Sensation intact distally Psychiatric: Patient is competent for consent with normal mood and  affect Lymphatic: No axillary or cervical lymphadenopathy  MUSCULOSKELETAL: right knee with varus and AROM 3-120 with crepitance and a painful arc  Assessment: Right anteromedial knee osteoarthritis   Plan: Plan for Procedure(s): RIGHT KNEE UNI ARTHROPLASTY  The risks benefits and alternatives were discussed with the patient including but not limited to the risks of nonoperative treatment, versus surgical intervention including infection, bleeding, nerve injury,  blood clots, cardiopulmonary complications, morbidity, mortality, among others, and they were willing to proceed.    Patient's anticipated LOS is less than 2 midnights, meeting these requirements: - Younger than 74 - Lives within 1 hour of care - Has a competent adult at home to recover with post-op recover - NO history of  - Chronic pain requiring opiods  - Diabetes  - Coronary Artery Disease  - Heart failure  - Heart attack  - Stroke  - DVT/VTE  - Cardiac arrhythmia  - Respiratory Failure/COPD  - Renal failure  - Anemia  - Advanced Liver disease     Preoperative templating of the joint replacement has been completed, documented, and submitted to the Operating Room personnel in order to optimize intra-operative equipment management.  Johnny Bridge, MD Cell [phone]   03/31/2018 9:51 AM

## 2018-03-31 NOTE — Op Note (Signed)
03/31/2018  11:59 AM  PATIENT:  [name]    PRE-OPERATIVE DIAGNOSIS: Right anteromedial knee osteoarthritis  POST-OPERATIVE DIAGNOSIS:  Same  PROCEDURE:  Unicompartmental Knee Arthroplasty  SURGEON:  Johnny Bridge, MD  PHYSICIAN ASSISTANT: Joya Gaskins, OPA-C, present and scrubbed throughout the case, critical for completion in a timely fashion, and for retraction, instrumentation, and closure.  ANESTHESIA:   General with regional block  ESTIMATED BLOOD LOSS: 100 mL  UNIQUE ASPECTS OF THE CASE: She was a fairly small overall.  Bone quality was reasonably good.  The size 3 had good tension.  There was one area of posterior capsular meniscal tissue which I was concerned was going to impinge and I resected immediately prior to the cementing portion of the procedure.  PREOPERATIVE INDICATIONS:  [name] is a  62 y.o. female with a diagnosis of RIGHT KNEE OSTEOARTHRITIS who failed conservative measures and elected for surgical management.  She also failed previous knee arthroscopy.  The risks benefits and alternatives were discussed with the patient preoperatively including but not limited to the risks of infection, bleeding, nerve injury, cardiopulmonary complications, blood clots, the need for revision surgery, among others, and the patient was willing to proceed.  OPERATIVE IMPLANTS: Biomet Oxford mobile bearing medial compartment arthroplasty femur size small, tibia size AA, bearing size 3.  OPERATIVE FINDINGS: Endstage grade 4 medial compartment osteoarthritis.  The ACL was intact.  The femoral trochlea had some grade 2 changes in the medial facet of the patella also had some grade 2 changes.  There was some osteophyte rimming over superiorly along the patellofemoral trochlea as well as on either side of the patella which was removed.  There is also significant medial rimming osteophyte.  OPERATIVE PROCEDURE: The patient was brought to the operating room placed in the  supine position. General anesthesia was administered. IV antibiotics were given. The lower extremity was placed in the legholder and prepped and draped in usual sterile fashion.  Time out was performed.  The leg was elevated and exsanguinated and the tourniquet was inflated. Anteromedial incision was performed, and I took care to preserve the MCL. Parapatellar incision was carried out, and the osteophytes were excised, along with the medial meniscus and a small portion of the fat pad.  The extra medullary tibial cutting jig was applied, using the spoon and the 16mm G-Clamp and the 2 mm shim, and I took care to protect the anterior cruciate ligament insertion and the tibial spine. The medial collateral ligament was also protected, and I resected my proximal tibia, matching the anatomic slope.   The proximal tibial bony cut was removed in one piece, and I turned my attention to the femur.  The intramedullary femoral rod was placed using the drill, and then using the appropriate reference, I assembled the femoral jig, setting my posterior cutting block. I resected my posterior femur, used the 0 spigot for the anterior femur, and then measured my gap.   I then used the appropriate mill to match the extension gap to the flexion gap. The second milling was at a 3.  The gaps were then measured again with the appropriate feeler gauges. Once I had balanced flexion and extension gaps, I then completed the preparation of the femur.  I milled off the anterior aspect of the distal femur to prevent impingement. I also exposed the tibia, and selected the above-named component, and then used the cutting jig to prepare the keel slot on the tibia. I also used the  awl to curette out the bone to complete the preparation of the keel. The back wall was intact.  I then placed trial components, and it was found to have excellent motion, and appropriate balance.  I then cemented the components into place, cementing the  tibia first, removing all excess cement, and then cementing the femur.  All loose cement was removed.  The real polyethylene insert was applied manually, and the knee was taken through functional range of motion, and found to have excellent stability and restoration of joint motion, with excellent balance.  The wounds were irrigated copiously, and the parapatellar tissue closed with Vicryl, followed by Vicryl for the subcutaneous tissue, with routine closure with Steri-Strips and sterile gauze.  The tourniquet was released, and the patient was awakened and extubated and returned to PACU in stable and satisfactory condition. There were no complications.

## 2018-03-31 NOTE — Progress Notes (Signed)
Assisted Dr. Turk with right, ultrasound guided, adductor canal block. Side rails up, monitors on throughout procedure. See vital signs in flow sheet. Tolerated Procedure well.  

## 2018-03-31 NOTE — Anesthesia Preprocedure Evaluation (Addendum)
Anesthesia Evaluation    History of Anesthesia Complications (+) PONV and history of anesthetic complications  Airway Mallampati: II  TM Distance: >3 FB Neck ROM: Full    Dental  (+) Dental Advisory Given, Partial Upper   Pulmonary sleep apnea and Continuous Positive Airway Pressure Ventilation , former smoker,    Pulmonary exam normal breath sounds clear to auscultation       Cardiovascular hypertension, Pt. on medications + Valvular Problems/Murmurs AI and MVP  Rhythm:Regular Rate:Normal + Diastolic murmurs    Neuro/Psych  Headaches, PSYCHIATRIC DISORDERS Anxiety    GI/Hepatic GERD  Controlled and Medicated,  Endo/Other  Hypothyroidism Obesity   Renal/GU 1 kidney     Musculoskeletal  (+) Arthritis , Osteoarthritis,    Abdominal   Peds  Hematology   Anesthesia Other Findings   Reproductive/Obstetrics                           Anesthesia Physical Anesthesia Plan  ASA: II  Anesthesia Plan: General   Post-op Pain Management:  Regional for Post-op pain   Induction: Intravenous  PONV Risk Score and Plan: 4 or greater and Scopolamine patch - Pre-op, Midazolam, Dexamethasone and Ondansetron  Airway Management Planned: Oral ETT  Additional Equipment:   Intra-op Plan:   Post-operative Plan: Extubation in OR  Informed Consent: I have reviewed the patients History and Physical, chart, labs and discussed the procedure including the risks, benefits and alternatives for the proposed anesthesia with the patient or authorized representative who has indicated his/her understanding and acceptance.   Dental advisory given  Plan Discussed with: CRNA  Anesthesia Plan Comments:         Anesthesia Quick Evaluation

## 2018-04-01 ENCOUNTER — Encounter (HOSPITAL_BASED_OUTPATIENT_CLINIC_OR_DEPARTMENT_OTHER): Payer: Self-pay | Admitting: Orthopedic Surgery

## 2018-04-01 NOTE — Anesthesia Postprocedure Evaluation (Signed)
Anesthesia Post Note  Patient: [name]  Procedure(s) Performed: RIGHT KNEE UNI ARTHROPLASTY (Right Knee)     Patient location during evaluation: PACU Anesthesia Type: General Level of consciousness: awake and alert, oriented and awake Pain management: pain level controlled Vital Signs Assessment: post-procedure vital signs reviewed and stable Respiratory status: spontaneous breathing, nonlabored ventilation and respiratory function stable Cardiovascular status: blood pressure returned to baseline and stable Postop Assessment: no apparent nausea or vomiting Anesthetic complications: no    Last Vitals:  Vitals:   03/31/18 1430 03/31/18 1510  BP: 112/63 110/62  Pulse: 91 91  Resp: 11 16  Temp:  36.6 C  SpO2: 96% 94%    Last Pain:  Vitals:   04/01/18 1017  TempSrc:   PainSc: 3                  [name]

## 2018-04-08 DIAGNOSIS — G4733 Obstructive sleep apnea (adult) (pediatric): Secondary | ICD-10-CM | POA: Diagnosis not present

## 2018-04-13 DIAGNOSIS — M1711 Unilateral primary osteoarthritis, right knee: Secondary | ICD-10-CM | POA: Diagnosis not present

## 2018-04-13 MED FILL — ROSUVASTATIN CALCIUM 10 MG: 10 | 90 days supply | Qty: 90 | Fill #2

## 2018-04-13 MED FILL — LEVOTHYROXINE 137 MCG TAB: 137 | 30 days supply | Qty: 30 | Fill #6

## 2018-05-04 MED FILL — LISINOPRIL 2.5 MG TABLET: 2.5 | 90 days supply | Qty: 90 | Fill #2

## 2018-05-04 MED FILL — ESOMEPRAZOLE MAG DR 40 MG C: 40 | 90 days supply | Qty: 90 | Fill #2

## 2018-05-11 DIAGNOSIS — M1711 Unilateral primary osteoarthritis, right knee: Secondary | ICD-10-CM | POA: Diagnosis not present

## 2018-05-16 MED FILL — LEVOTHYROXINE 137 MCG TAB: 137 | 30 days supply | Qty: 30 | Fill #7

## 2018-05-19 ENCOUNTER — Other Ambulatory Visit: Payer: Self-pay | Admitting: Physician Assistant

## 2018-05-19 ENCOUNTER — Encounter: Payer: Self-pay | Admitting: Physician Assistant

## 2018-05-19 DIAGNOSIS — N644 Mastodynia: Secondary | ICD-10-CM

## 2018-05-19 DIAGNOSIS — Z1231 Encounter for screening mammogram for malignant neoplasm of breast: Secondary | ICD-10-CM

## 2018-05-20 ENCOUNTER — Other Ambulatory Visit: Payer: Self-pay | Admitting: Physician Assistant

## 2018-05-20 DIAGNOSIS — N644 Mastodynia: Secondary | ICD-10-CM

## 2018-05-26 ENCOUNTER — Ambulatory Visit
Admission: RE | Admit: 2018-05-26 | Discharge: 2018-05-26 | Disposition: A | Discharge status: Home / Self Care | Payer: 59 | Source: Ambulatory Visit | Attending: Physician Assistant | Admitting: Physician Assistant

## 2018-05-26 DIAGNOSIS — R928 Other abnormal and inconclusive findings on diagnostic imaging of breast: Secondary | ICD-10-CM | POA: Diagnosis not present

## 2018-05-26 DIAGNOSIS — N6489 Other specified disorders of breast: Secondary | ICD-10-CM | POA: Diagnosis not present

## 2018-05-26 DIAGNOSIS — N644 Mastodynia: Secondary | ICD-10-CM

## 2018-06-13 MED FILL — LEVOTHYROXINE 137 MCG TAB: 137 | 30 days supply | Qty: 30 | Fill #8

## 2018-06-29 ENCOUNTER — Telehealth: Payer: Self-pay | Admitting: Internal Medicine

## 2018-06-29 DIAGNOSIS — R1310 Dysphagia, unspecified: Secondary | ICD-10-CM

## 2018-06-29 DIAGNOSIS — R1110 Vomiting, unspecified: Secondary | ICD-10-CM

## 2018-06-29 NOTE — Telephone Encounter (Signed)
Left message for patient to call back  

## 2018-06-29 NOTE — Telephone Encounter (Signed)
Patient has been scheduled at [facility] for 07/04/18 7:45 arrival for 8:00. She is aware of the appt details and to be NPO for 3 hours prior

## 2018-06-29 NOTE — Telephone Encounter (Signed)
Finds that she has to cough up or regurgitate a bit with eating some solids. Breads in particular. No clear impact dysphagia but new in past few months.  Does not aspirate.  On PPI  Plan:  Ba swallow with tablet  Dx: dysphagia

## 2018-07-01 DIAGNOSIS — M1711 Unilateral primary osteoarthritis, right knee: Secondary | ICD-10-CM | POA: Diagnosis not present

## 2018-07-04 ENCOUNTER — Ambulatory Visit (HOSPITAL_COMMUNITY)
Admission: RE | Admit: 2018-07-04 | Discharge: 2018-07-04 | Disposition: A | Discharge status: Home / Self Care | Payer: 59 | Source: Ambulatory Visit | Attending: Internal Medicine | Admitting: Internal Medicine

## 2018-07-04 DIAGNOSIS — R1110 Vomiting, unspecified: Secondary | ICD-10-CM | POA: Diagnosis not present

## 2018-07-04 DIAGNOSIS — R1310 Dysphagia, unspecified: Secondary | ICD-10-CM | POA: Insufficient documentation

## 2018-07-04 MED FILL — HYDROCHLOROTHIAZIDE 12.5 MG: 12.5 | 90 days supply | Qty: 90 | Fill #2

## 2018-07-06 ENCOUNTER — Telehealth: Payer: Self-pay

## 2018-07-06 NOTE — Telephone Encounter (Signed)
Notes on file.

## 2018-07-08 DIAGNOSIS — G4733 Obstructive sleep apnea (adult) (pediatric): Secondary | ICD-10-CM | POA: Diagnosis not present

## 2018-07-10 NOTE — Progress Notes (Signed)
Please schedule modified barium swallow  Dx: dysphagia and silent aspiration seen on ba swallow  She is aware of results and plans (discussed in person)

## 2018-07-11 ENCOUNTER — Other Ambulatory Visit (HOSPITAL_COMMUNITY): Payer: Self-pay | Admitting: Internal Medicine

## 2018-07-11 ENCOUNTER — Other Ambulatory Visit: Payer: Self-pay

## 2018-07-11 DIAGNOSIS — R1310 Dysphagia, unspecified: Secondary | ICD-10-CM

## 2018-07-13 MED FILL — LEVOTHYROXINE 137 MCG TAB: 137 | 30 days supply | Qty: 30 | Fill #9

## 2018-07-13 MED FILL — ROSUVASTATIN CALCIUM 10 MG: 10 | 90 days supply | Qty: 90 | Fill #3

## 2018-07-19 ENCOUNTER — Ambulatory Visit (HOSPITAL_COMMUNITY)
Admission: RE | Admit: 2018-07-19 | Discharge: 2018-07-19 | Disposition: A | Discharge status: Home / Self Care | Payer: 59 | Source: Ambulatory Visit | Attending: Internal Medicine | Admitting: Internal Medicine

## 2018-07-19 DIAGNOSIS — R1310 Dysphagia, unspecified: Secondary | ICD-10-CM | POA: Insufficient documentation

## 2018-07-27 NOTE — Progress Notes (Signed)
Patient aware of results We have discussed

## 2018-07-29 MED FILL — ESOMEPRAZOLE MAG DR 40 MG C: 40 | 90 days supply | Qty: 90 | Fill #3

## 2018-07-29 MED FILL — LISINOPRIL 2.5 MG TABLET: 2.5 | 90 days supply | Qty: 90 | Fill #3

## 2018-08-15 MED FILL — LEVOTHYROXINE 137 MCG TAB: 137 | 30 days supply | Qty: 30 | Fill #10

## 2018-08-29 MED FILL — CITALOPRAM HBR 20 MG TABLET: 20 | 90 days supply | Qty: 90 | Fill #2

## 2018-09-12 MED FILL — LEVOTHYROXINE 137 MCG TAB: 137 | 30 days supply | Qty: 30 | Fill #11

## 2018-10-04 MED FILL — HYDROCHLOROTHIAZIDE 12.5 MG: 12.5 | 90 days supply | Qty: 90 | Fill #3

## 2018-10-10 DIAGNOSIS — G4733 Obstructive sleep apnea (adult) (pediatric): Secondary | ICD-10-CM | POA: Diagnosis not present

## 2018-10-14 ENCOUNTER — Encounter: Payer: 59 | Admitting: Physician Assistant

## 2018-10-17 ENCOUNTER — Other Ambulatory Visit: Payer: Self-pay | Admitting: Physician Assistant

## 2018-10-17 DIAGNOSIS — E039 Hypothyroidism, unspecified: Secondary | ICD-10-CM

## 2018-10-17 DIAGNOSIS — E781 Pure hyperglyceridemia: Secondary | ICD-10-CM

## 2018-10-17 DIAGNOSIS — E782 Mixed hyperlipidemia: Secondary | ICD-10-CM

## 2018-10-17 MED FILL — ROSUVASTATIN CALCIUM 10 MG: 10 | 30 days supply | Qty: 30 | Fill #0

## 2018-10-17 MED FILL — LEVOTHYROXINE 137 MCG TAB: 137 | 30 days supply | Qty: 30 | Fill #0

## 2018-10-31 ENCOUNTER — Encounter: Payer: Self-pay | Admitting: Physician Assistant

## 2018-10-31 ENCOUNTER — Other Ambulatory Visit: Payer: Self-pay | Admitting: Physician Assistant

## 2018-10-31 DIAGNOSIS — K2100 Gastro-esophageal reflux disease with esophagitis, without bleeding: Secondary | ICD-10-CM

## 2018-10-31 DIAGNOSIS — I10 Essential (primary) hypertension: Secondary | ICD-10-CM

## 2018-10-31 MED FILL — ESOMEPRAZOLE MAG DR 40 MG C: 40 | 90 days supply | Qty: 90 | Fill #0

## 2018-10-31 MED FILL — LISINOPRIL 2.5 MG TABLET: 2.5 | 90 days supply | Qty: 90 | Fill #0

## 2018-11-01 NOTE — Telephone Encounter (Signed)
If she is available can we schedule for telephone visit for medication refills?

## 2018-11-02 ENCOUNTER — Encounter: Payer: Self-pay | Admitting: Physician Assistant

## 2018-11-02 ENCOUNTER — Other Ambulatory Visit: Payer: Self-pay

## 2018-11-02 ENCOUNTER — Ambulatory Visit (INDEPENDENT_AMBULATORY_CARE_PROVIDER_SITE_OTHER): Payer: 59 | Admitting: Physician Assistant

## 2018-11-02 VITALS — BP 117/66 | HR 88 | Temp 98.1°F | Resp 16 | Ht 65.0 in | Wt 224.0 lb

## 2018-11-02 DIAGNOSIS — E039 Hypothyroidism, unspecified: Secondary | ICD-10-CM | POA: Diagnosis not present

## 2018-11-02 DIAGNOSIS — E782 Mixed hyperlipidemia: Secondary | ICD-10-CM | POA: Diagnosis not present

## 2018-11-02 DIAGNOSIS — E781 Pure hyperglyceridemia: Secondary | ICD-10-CM | POA: Diagnosis not present

## 2018-11-02 DIAGNOSIS — K2100 Gastro-esophageal reflux disease with esophagitis, without bleeding: Secondary | ICD-10-CM

## 2018-11-02 DIAGNOSIS — E6609 Other obesity due to excess calories: Secondary | ICD-10-CM

## 2018-11-02 DIAGNOSIS — I10 Essential (primary) hypertension: Secondary | ICD-10-CM

## 2018-11-02 DIAGNOSIS — F411 Generalized anxiety disorder: Secondary | ICD-10-CM | POA: Diagnosis not present

## 2018-11-02 DIAGNOSIS — Z6837 Body mass index (BMI) 37.0-37.9, adult: Secondary | ICD-10-CM

## 2018-11-02 MED ORDER — ONDANSETRON HCL 8 MG PO TABS: 8.0000 mg | ORAL_TABLET | Freq: Three times a day (TID) | ORAL | 0 refills | Status: DC | PRN

## 2018-11-02 MED ORDER — CITALOPRAM HYDROBROMIDE 20 MG PO TABS: 10.0000 mg | ORAL_TABLET | Freq: Every day | ORAL | 1 refills | Status: DC

## 2018-11-02 MED ORDER — ESOMEPRAZOLE MAGNESIUM 40 MG PO CPDR: DELAYED_RELEASE_CAPSULE | ORAL | 4 refills | Status: DC

## 2018-11-02 MED ORDER — LISINOPRIL 2.5 MG PO TABS: 2.5000 mg | ORAL_TABLET | Freq: Every day | ORAL | 3 refills | Status: DC

## 2018-11-02 MED ORDER — LIRAGLUTIDE -WEIGHT MANAGEMENT 18 MG/3ML ~~LOC~~ SOPN: 0.6000 mg | PEN_INJECTOR | Freq: Every day | SUBCUTANEOUS | 1 refills | Status: DC

## 2018-11-02 MED ORDER — INSULIN PEN NEEDLE 31G X 6 MM MISC: 0 refills | Status: DC

## 2018-11-02 MED ORDER — HYDROCHLOROTHIAZIDE 12.5 MG PO CAPS: 12.5000 mg | ORAL_CAPSULE | Freq: Every day | ORAL | 3 refills | Status: DC

## 2018-11-02 MED ORDER — ROSUVASTATIN CALCIUM 10 MG PO TABS: 10.0000 mg | ORAL_TABLET | Freq: Every day | ORAL | 1 refills | Status: DC

## 2018-11-02 MED ORDER — LEVOTHYROXINE SODIUM 137 MCG PO TABS: 137.0000 ug | ORAL_TABLET | Freq: Every day | ORAL | 0 refills | Status: DC

## 2018-11-02 MED FILL — ONDANSETRON HCL 8 MG TABLET: 8 | 6 days supply | Qty: 20 | Fill #0

## 2018-11-02 MED FILL — LEVOTHYROXINE 137 MCG TAB: 137 | 90 days supply | Qty: 90 | Fill #0

## 2018-11-02 MED FILL — ROSUVASTATIN CALCIUM 10 MG: 10 | 90 days supply | Qty: 90 | Fill #0

## 2018-11-02 NOTE — Progress Notes (Signed)
.Virtual Visit via Telephone Note  I connected with [name] on 11/02/18 at  3:00 PM EDT by telephone and verified that I am speaking with the correct person using two identifiers.   I discussed the limitations, risks, security and privacy concerns of performing an evaluation and management service by telephone and the availability of in person appointments. I also discussed with the patient that there may be a patient responsible charge related to this service. The patient expressed understanding and agreed to proceed.   History of Present Illness: Pt is a 63 yo female with HTN, pre diabetes, hypothyrodism, pre diabetes, anxiety who  Calls in for refills.   Overall patient is doing fine. She denies any problems or concerns. No CP, palpitations, headaches, vision changes. She is not dieting or exercising like she should. She is really frustrated with her weight. She has even gone to an interest meeting for gastric sleeve. She would like to try losing weight with medication again.   .. Active Ambulatory Problems    Diagnosis Date Noted  . UTI (urinary tract infection) 07/14/2012  . Other and unspecified hyperlipidemia 07/14/2012  . MVP (mitral valve prolapse) 07/14/2012  . GERD (gastroesophageal reflux disease) 07/14/2012  . Allergic rhinitis 07/14/2012  . Mitral regurgitation 07/14/2012  . S/P hysterectomy 07/14/2012  . Essential hypertension, benign 04/29/2013  . Wheezing 04/29/2013  . Insomnia 04/29/2013  . Unspecified vitamin D deficiency 04/29/2013  . Migraine headache 04/29/2013  . Anxiety state 04/29/2013  . Unspecified hypothyroidism 04/29/2013  . Obesity (BMI 30-39.9) 07/17/2013  . Cough 08/15/2013  . Arthritis of left knee 09/14/2013  . Status post total knee replacement 09/14/2013  . Abdominal pain, chronic, right upper quadrant 11/16/2013  . Candidiasis of female genitalia 11/16/2013  . Elevated liver enzymes 11/16/2013  . Folliculitis 25/95/6387  . Nausea alone  11/18/2013  . Abnormal transaminases 11/18/2013  . Hyperlipidemia 11/22/2014  . Single kidney 11/22/2014  . Hypothyroidism 11/22/2014  . OSA (obstructive sleep apnea) 12/23/2015  . Daytime sleepiness 01/24/2017  . Carpal tunnel syndrome, left upper limb 02/25/2017  . Status post carpal tunnel release 03/11/2017  . Hypertriglyceridemia 07/14/2017  . Elevated fasting glucose 07/14/2017  . Chronic pain of right knee 09/13/2017  . Unilateral primary osteoarthritis, right knee 09/27/2017  . Pre-diabetes 10/19/2017  . Pain in thumb joint with movement of right hand 10/19/2017  . Class 2 obesity due to excess calories without serious comorbidity with body mass index (BMI) of 37.0 to 37.9 in adult 10/19/2017  . Other meniscus derangements, posterior horn of medial meniscus, right knee 11/24/2017  . Pain of right thumb 11/24/2017  . Trigger thumb, right thumb 11/24/2017  . BPPV (benign paroxysmal positional vertigo), right 02/20/2018  . Concussion wth loss of consciousness of 30 minutes or less 02/20/2018  . Fall 02/20/2018   Resolved Ambulatory Problems    Diagnosis Date Noted  . Hypothyroidism 07/14/2012   Past Medical History:  Diagnosis Date  . Acute medial meniscal tear   . Allergy   . Anxiety   . Aortic insufficiency   . Aortic regurgitation   . Arthritis   . Carpal tunnel syndrome   . Chronic kidney disease   . Dysrhythmia   . Mitral valve prolapse   . Need for SBE (subacute bacterial endocarditis) prophylaxis   . Pilonidal cyst without mention of abscess   . Pneumonia Aug 14 2012  . PONV (postoperative nausea and vomiting)   . Sleep apnea   . Thyroid disease   .  Transplanted kidney removed    Reviewed medication, allergic, problem list.    Observations/Objective: No acute distress.   .. Today's Vitals   11/02/18 1512  BP: 117/66  Pulse: 88  Resp: 16  Temp: 98.1 F (36.7 C)  TempSrc: Oral  SpO2: 98%  Weight: 224 lb (101.6 kg)  Height: 5\' 5"  (1.651 m)    Body mass index is 37.28 kg/m.   Assessment and Plan: Marland KitchenMarland KitchenDiagnoses and all orders for this visit:  Essential hypertension, benign -     hydrochlorothiazide (MICROZIDE) 12.5 MG capsule; Take 1 capsule (12.5 mg total) by mouth daily. -     lisinopril (PRINIVIL,ZESTRIL) 2.5 MG tablet; Take 1 tablet (2.5 mg total) by mouth daily. -     COMPLETE METABOLIC PANEL WITH GFR  Mixed hyperlipidemia -     rosuvastatin (CRESTOR) 10 MG tablet; Take 1 tablet (10 mg total) by mouth daily. Must schedule appt for refills -     Lipid Panel w/reflex Direct LDL  Hypertriglyceridemia -     rosuvastatin (CRESTOR) 10 MG tablet; Take 1 tablet (10 mg total) by mouth daily. Must schedule appt for refills -     Lipid Panel w/reflex Direct LDL  Hypothyroidism, unspecified type -     levothyroxine (SYNTHROID, LEVOTHROID) 137 MCG tablet; Take 1 tablet (137 mcg total) by mouth daily before breakfast for 30 days. -     COMPLETE METABOLIC PANEL WITH GFR -     TSH  Gastroesophageal reflux disease with esophagitis -     esomeprazole (NEXIUM) 40 MG capsule; TAKE 1 CAPSULE (40 MG TOTAL) BY MOUTH DAILY BEFORE BREAKFAST. -     ondansetron (ZOFRAN) 8 MG tablet; Take 1 tablet (8 mg total) by mouth every 8 (eight) hours as needed for nausea or vomiting.  Class 2 obesity due to excess calories without serious comorbidity with body mass index (BMI) of 37.0 to 37.9 in adult -     Liraglutide -Weight Management (SAXENDA) 18 MG/3ML SOPN; Inject 0.6 mg into the skin daily. For one week then increase by .6mg  weekly until reaches 3mg  daily.  Please include ultra fine needles 68mm -     Insulin Pen Needle 31G X 6 MM MISC; Use with saxenda pen once a day.  Anxiety state -     citalopram (CELEXA) 20 MG tablet; Take 0.5 tablets (10 mg total) by mouth daily.   .. Depression screen [facility] 2/9 11/02/2018 03/08/2018 06/17/2017 04/07/2017  Decreased Interest 0 0 0 0  Down, Depressed, Hopeless 0 0 0 0  PHQ - 2 Score 0 0 0 0  Altered  sleeping 0 0 - -  Tired, decreased energy 0 0 - -  Change in appetite 0 0 - -  Feeling bad or failure about yourself  0 0 - -  Trouble concentrating 0 0 - -  Moving slowly or fidgety/restless 0 0 - -  Suicidal thoughts 0 0 - -  PHQ-9 Score 0 0 - -  Difficult doing work/chores Not difficult at all Not difficult at all - -   .Marland Kitchen GAD 7 : Generalized Anxiety Score 11/02/2018 03/08/2018 04/07/2017  Nervous, Anxious, on Edge 0 0 0  Control/stop worrying 0 0 1  Worry too much - different things 0 0 1  Trouble relaxing 0 0 0  Restless 0 0 0  Easily annoyed or irritable 0 0 0  Afraid - awful might happen 0 0 0  Total GAD 7 Score 0 0 2  Anxiety Difficulty Not difficult  at all Not difficult at all Not difficult at all   Pt is due for labs. Ordered to get in the near future.   Medications were refilled today.   Marland Kitchen.Discussed low carb diet with 1500 calories and 80g of protein.  Exercising at least 150 minutes a week.  My Fitness Pal could be a Microbiologist.  Discussed options. She would like to try saxenda again. Discussed how to restart. She aware of the site reactions before.   Follow up in 1 month for weight.   Follow Up Instructions:    I discussed the assessment and treatment plan with the patient. The patient was provided an opportunity to ask questions and all were answered. The patient agreed with the plan and demonstrated an understanding of the instructions.   The patient was advised to call back or seek an in-person evaluation if the symptoms worsen or if the condition fails to improve as anticipated.  I provided 17 minutes of non-face-to-face time during this encounter.   Iran Planas, PA-C

## 2018-11-03 ENCOUNTER — Encounter: Payer: Self-pay | Admitting: Physician Assistant

## 2018-11-03 MED FILL — TECHLITE PEN NDL 32GX1/4: 32G X 6 MM | 90 days supply | Qty: 100 | Fill #0

## 2018-11-03 MED FILL — TECHLITE PEN NDL 32GX1/4": 32G X 6 MM | 90 days supply | Qty: 100 | Fill #0

## 2018-11-03 MED FILL — SAXENDA 18 MG/3 ML PEN: 18 | 30 days supply | Qty: 15 | Fill #0

## 2019-01-05 MED FILL — HYDROCHLOROTHIAZIDE 12.5 MG: 12.5 | 90 days supply | Qty: 90 | Fill #0

## 2019-01-10 DIAGNOSIS — G4733 Obstructive sleep apnea (adult) (pediatric): Secondary | ICD-10-CM | POA: Diagnosis not present

## 2019-01-31 MED FILL — LISINOPRIL 2.5 MG TABLET: 2.5 | 90 days supply | Qty: 90 | Fill #0

## 2019-01-31 MED FILL — ESOMEPRAZOLE MAG DR 40 MG C: 40 | 90 days supply | Qty: 90 | Fill #0

## 2019-02-08 MED FILL — AMOXICILLIN 500 MG CAPSULE: 500 | 3 days supply | Qty: 12 | Fill #0

## 2019-02-14 ENCOUNTER — Other Ambulatory Visit: Payer: Self-pay | Admitting: Physician Assistant

## 2019-02-14 DIAGNOSIS — F411 Generalized anxiety disorder: Secondary | ICD-10-CM

## 2019-02-14 DIAGNOSIS — E039 Hypothyroidism, unspecified: Secondary | ICD-10-CM

## 2019-02-14 MED FILL — LEVOTHYROXINE 137 MCG TAB: 137 | 30 days supply | Qty: 30 | Fill #0

## 2019-02-14 MED FILL — ROSUVASTATIN CALCIUM 10 MG: 10 | 90 days supply | Qty: 90 | Fill #1

## 2019-02-17 MED FILL — CITALOPRAM HBR 20 MG TABLET: 20 | 90 days supply | Qty: 45 | Fill #0

## 2019-02-28 ENCOUNTER — Encounter: Payer: 59 | Admitting: Physician Assistant

## 2019-03-03 DIAGNOSIS — S0502XA Injury of conjunctiva and corneal abrasion without foreign body, left eye, initial encounter: Secondary | ICD-10-CM | POA: Diagnosis not present

## 2019-03-03 MED FILL — ERYTHROMYCIN EYE OINTMENT: 5 | 10 days supply | Qty: 4 | Fill #0

## 2019-03-03 MED FILL — AZELASTINE HCL 0.05% DROPS: 0.05 | 60 days supply | Qty: 6 | Fill #0

## 2019-03-07 DIAGNOSIS — H04123 Dry eye syndrome of bilateral lacrimal glands: Secondary | ICD-10-CM | POA: Diagnosis not present

## 2019-03-07 DIAGNOSIS — S0502XA Injury of conjunctiva and corneal abrasion without foreign body, left eye, initial encounter: Secondary | ICD-10-CM | POA: Diagnosis not present

## 2019-03-09 ENCOUNTER — Encounter: Payer: Self-pay | Admitting: Physician Assistant

## 2019-03-09 ENCOUNTER — Ambulatory Visit (INDEPENDENT_AMBULATORY_CARE_PROVIDER_SITE_OTHER): Payer: 59 | Admitting: Physician Assistant

## 2019-03-09 ENCOUNTER — Telehealth: Payer: Self-pay | Admitting: Physician Assistant

## 2019-03-09 ENCOUNTER — Other Ambulatory Visit: Payer: Self-pay

## 2019-03-09 VITALS — BP 117/60 | HR 76 | Temp 98.0°F | Ht 65.0 in | Wt 218.0 lb

## 2019-03-09 DIAGNOSIS — E6609 Other obesity due to excess calories: Secondary | ICD-10-CM

## 2019-03-09 DIAGNOSIS — E781 Pure hyperglyceridemia: Secondary | ICD-10-CM | POA: Diagnosis not present

## 2019-03-09 DIAGNOSIS — E66812 Obesity, class 2: Secondary | ICD-10-CM

## 2019-03-09 DIAGNOSIS — Z6837 Body mass index (BMI) 37.0-37.9, adult: Secondary | ICD-10-CM | POA: Diagnosis not present

## 2019-03-09 DIAGNOSIS — I10 Essential (primary) hypertension: Secondary | ICD-10-CM

## 2019-03-09 DIAGNOSIS — E039 Hypothyroidism, unspecified: Secondary | ICD-10-CM | POA: Diagnosis not present

## 2019-03-09 DIAGNOSIS — Z0000 Encounter for general adult medical examination without abnormal findings: Secondary | ICD-10-CM

## 2019-03-09 DIAGNOSIS — E782 Mixed hyperlipidemia: Secondary | ICD-10-CM | POA: Diagnosis not present

## 2019-03-09 MED ORDER — SAXENDA 18 MG/3ML ~~LOC~~ SOPN: 3.0000 mg | PEN_INJECTOR | Freq: Every day | SUBCUTANEOUS | 2 refills | Status: DC

## 2019-03-09 NOTE — Telephone Encounter (Signed)
Per pt got mammogram recently at breast clinic I do not see in [facility].

## 2019-03-09 NOTE — Patient Instructions (Signed)
Health Maintenance, Female Adopting a healthy lifestyle and getting preventive care are important in promoting health and wellness. Ask your health care provider about:  The right schedule for you to have regular tests and exams.  Things you can do on your own to prevent diseases and keep yourself healthy. What should I know about diet, weight, and exercise? Eat a healthy diet   Eat a diet that includes plenty of vegetables, fruits, low-fat dairy products, and lean protein.  Do not eat a lot of foods that are high in solid fats, added sugars, or sodium. Maintain a healthy weight Body mass index (BMI) is used to identify weight problems. It estimates body fat based on height and weight. Your health care provider can help determine your BMI and help you achieve or maintain a healthy weight. Get regular exercise Get regular exercise. This is one of the most important things you can do for your health. Most adults should:  Exercise for at least 150 minutes each week. The exercise should increase your heart rate and make you sweat (moderate-intensity exercise).  Do strengthening exercises at least twice a week. This is in addition to the moderate-intensity exercise.  Spend less time sitting. Even light physical activity can be beneficial. Watch cholesterol and blood lipids Have your blood tested for lipids and cholesterol at 64 years of age, then have this test every 5 years. Have your cholesterol levels checked more often if:  Your lipid or cholesterol levels are high.  You are older than 64 years of age.  You are at high risk for heart disease. What should I know about cancer screening? Depending on your health history and family history, you may need to have cancer screening at various ages. This may include screening for:  Breast cancer.  Cervical cancer.  Colorectal cancer.  Skin cancer.  Lung cancer. What should I know about heart disease, diabetes, and high blood  pressure? Blood pressure and heart disease  High blood pressure causes heart disease and increases the risk of stroke. This is more likely to develop in people who have high blood pressure readings, are of African descent, or are overweight.  Have your blood pressure checked: ? Every 3-5 years if you are 18-39 years of age. ? Every year if you are 40 years old or older. Diabetes Have regular diabetes screenings. This checks your fasting blood sugar level. Have the screening done:  Once every three years after age 40 if you are at a normal weight and have a low risk for diabetes.  More often and at a younger age if you are overweight or have a high risk for diabetes. What should I know about preventing infection? Hepatitis B If you have a higher risk for hepatitis B, you should be screened for this virus. Talk with your health care provider to find out if you are at risk for hepatitis B infection. Hepatitis C Testing is recommended for:  Everyone born from 1945 through 1965.  Anyone with known risk factors for hepatitis C. Sexually transmitted infections (STIs)  Get screened for STIs, including gonorrhea and chlamydia, if: ? You are sexually active and are younger than 64 years of age. ? You are older than 64 years of age and your health care provider tells you that you are at risk for this type of infection. ? Your sexual activity has changed since you were last screened, and you are at increased risk for chlamydia or gonorrhea. Ask your health care provider if   you are at risk.  Ask your health care provider about whether you are at high risk for HIV. Your health care provider may recommend a prescription medicine to help prevent HIV infection. If you choose to take medicine to prevent HIV, you should first get tested for HIV. You should then be tested every 3 months for as long as you are taking the medicine. Pregnancy  If you are about to stop having your period (premenopausal) and  you may become pregnant, seek counseling before you get pregnant.  Take 400 to 800 micrograms (mcg) of folic acid every day if you become pregnant.  Ask for birth control (contraception) if you want to prevent pregnancy. Osteoporosis and menopause Osteoporosis is a disease in which the bones lose minerals and strength with aging. This can result in bone fractures. If you are 65 years old or older, or if you are at risk for osteoporosis and fractures, ask your health care provider if you should:  Be screened for bone loss.  Take a calcium or vitamin D supplement to lower your risk of fractures.  Be given hormone replacement therapy (HRT) to treat symptoms of menopause. Follow these instructions at home: Lifestyle  Do not use any products that contain nicotine or tobacco, such as cigarettes, e-cigarettes, and chewing tobacco. If you need help quitting, ask your health care provider.  Do not use street drugs.  Do not share needles.  Ask your health care provider for help if you need support or information about quitting drugs. Alcohol use  Do not drink alcohol if: ? Your health care provider tells you not to drink. ? You are pregnant, may be pregnant, or are planning to become pregnant.  If you drink alcohol: ? Limit how much you use to 0-1 drink a day. ? Limit intake if you are breastfeeding.  Be aware of how much alcohol is in your drink. In the [place] one drink equals one 12 oz bottle of beer (355 mL), one 5 oz glass of wine (148 mL), or one 1 oz glass of hard liquor (44 mL). General instructions  Schedule regular health, dental, and eye exams.  Stay current with your vaccines.  Tell your health care provider if: ? You often feel depressed. ? You have ever been abused or do not feel safe at home. Summary  Adopting a healthy lifestyle and getting preventive care are important in promoting health and wellness.  Follow your health care provider's instructions about healthy  diet, exercising, and getting tested or screened for diseases.  Follow your health care provider's instructions on monitoring your cholesterol and blood pressure. This information is not intended to replace advice given to you by your health care provider. Make sure you discuss any questions you have with your health care provider. Document Released: 02/09/2011 Document Revised: 07/20/2018 Document Reviewed: 07/20/2018 Elsevier Patient Education  2020 Elsevier Inc.  

## 2019-03-09 NOTE — Progress Notes (Signed)
Subjective:     [name] is a 63 y.o. female and is here for a comprehensive physical exam. The patient reports no problems.  Social History   Socioeconomic History  . Marital status: Married    Spouse name: [name]  . Number of children: 4  . Years of education: 14+  . Highest education level: Not on file  Occupational History    Employer: [employer]  . Financial resource strain: Not on file  . Food insecurity    Worry: Not on file    Inability: Not on file  . Transportation needs    Medical: Not on file    Non-medical: Not on file  Tobacco Use  . Smoking status: Former Smoker    Quit date: 11/22/1983    Years since quitting: 35.3  . Smokeless tobacco: Never Used  Substance and Sexual Activity  . Alcohol use: Yes    Comment: occasional  . Drug use: No  . Sexual activity: Yes    Partners: Male  Lifestyle  . Physical activity    Days per week: Not on file    Minutes per session: Not on file  . Stress: Not on file  Relationships  . Social Herbalist on phone: Not on file    Gets together: Not on file    Attends religious service: Not on file    Active member of club or organization: Not on file    Attends meetings of clubs or organizations: Not on file    Relationship status: Not on file  . Intimate partner violence    Fear of current or ex partner: Not on file    Emotionally abused: Not on file    Physically abused: Not on file    Forced sexual activity: Not on file  Other Topics Concern  . Not on file  Social History Narrative   Marital Status:  Married Marine scientist)   Children: 4   Pets:  Dogs (2)   Living Situation: Lives with spouse, son and his family.     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[place]     Education:  Forensic psychologist; MSN    Tobacco Use/Exposure: None   Alcohol Use: Rarely   Drug Use:  None   Diet:  Low Fat    Exercise:  Limited (Walking Dogs)    Hobbies:  Reading, Movies             Health  Maintenance  Topic Date Due  . INFLUENZA VACCINE  03/11/2019  . MAMMOGRAM  05/26/2020  . TETANUS/TDAP  09/27/2021  . COLONOSCOPY  05/13/2023  . Hepatitis C Screening  Completed  . HIV Screening  Completed    The following portions of the patient's history were reviewed and updated as appropriate: allergies, current medications, past family history, past medical history, past social history, past surgical history and problem list.  Review of Systems A comprehensive review of systems was negative.   Objective:    BP 117/60   Pulse 76   Temp 98 F (36.7 C) (Oral)   Ht 5\' 5"  (1.651 m)   Wt 218 lb (98.9 kg)   SpO2 96%   BMI 36.28 kg/m  General appearance: alert, cooperative, appears stated age and moderately obese Head: Normocephalic, without obvious abnormality, atraumatic Eyes: conjunctivae/corneas clear. PERRL, EOM's intact. Fundi benign. Ears: normal TM's and external ear canals both ears Nose: Nares normal. Septum midline. Mucosa normal. No drainage or sinus tenderness.  Throat: lips, mucosa, and tongue normal; teeth and gums normal Neck: no adenopathy, no carotid bruit, no JVD, supple, symmetrical, trachea midline and thyroid not enlarged, symmetric, no tenderness/mass/nodules Back: symmetric, no curvature. ROM normal. No CVA tenderness. Lungs: clear to auscultation bilaterally Heart: regular rate and rhythm, S1, S2 normal, no murmur, click, rub or gallop Abdomen: soft, non-tender; bowel sounds normal; no masses,  no organomegaly Extremities: extremities normal, atraumatic, no cyanosis or edema Pulses: 2+ and symmetric Skin: Skin color, texture, turgor normal. No rashes or lesions Lymph nodes: Cervical, supraclavicular, and axillary nodes normal. Neurologic: Alert and oriented X 3, normal strength and tone. Normal symmetric reflexes. Normal coordination and gait    Assessment:    Healthy female exam.      Plan:    Marland KitchenMarland KitchenAliegha was seen today for annual exam.  Diagnoses  and all orders for this visit:  Routine physical examination  Class 2 obesity due to excess calories without serious comorbidity with body mass index (BMI) of 37.0 to 37.9 in adult -     Liraglutide -Weight Management (SAXENDA) 18 MG/3ML SOPN; Inject 3 mg into the skin daily. Inject 3mg  into skin daily.  Essential hypertension, benign   .Marland Kitchen Depression screen [facility] 2/9 03/09/2019 11/02/2018 03/08/2018 06/17/2017 04/07/2017  Decreased Interest 0 0 0 0 0  Down, Depressed, Hopeless 0 0 0 0 0  PHQ - 2 Score 0 0 0 0 0  Altered sleeping 0 0 0 - -  Tired, decreased energy 0 0 0 - -  Change in appetite 0 0 0 - -  Feeling bad or failure about yourself  0 0 0 - -  Trouble concentrating 0 0 0 - -  Moving slowly or fidgety/restless 0 0 0 - -  Suicidal thoughts 0 0 0 - -  PHQ-9 Score 0 0 0 - -  Difficult doing work/chores Not difficult at all Not difficult at all Not difficult at all - -   .Marland Kitchen Discussed 150 minutes of exercise a week.  Encouraged vitamin D 1000 units and Calcium 1300mg  or 4 servings of dairy a day.  Fasting labs ordered.  Mammogram done last week at breast clinic.  Hysterectomy and no need for pap.  Colonoscopy up to date.  Vaccines up to date.   Marland Kitchen.Discussed low carb diet with 1500 calories and 80g of protein.  Exercising at least 150 minutes a week.  My Fitness Pal could be a Microbiologist.  Continue on saxenda. Stay faithful.  Consider IF 16:8.  Follow up in 3 months.   See After Visit Summary for Counseling Recommendations

## 2019-03-09 NOTE — Telephone Encounter (Signed)
Called patient because last mgm is from October in the system to find out where she had done. She states it was probably in October the last time she had it. She will get repeat mammogram in October.   [place].

## 2019-03-10 ENCOUNTER — Encounter: Payer: Self-pay | Admitting: Physician Assistant

## 2019-03-10 ENCOUNTER — Other Ambulatory Visit: Payer: Self-pay | Admitting: Physician Assistant

## 2019-03-10 DIAGNOSIS — N183 Chronic kidney disease, stage 3 unspecified: Secondary | ICD-10-CM | POA: Insufficient documentation

## 2019-03-10 LAB — COMPLETE METABOLIC PANEL WITH GFR
AG Ratio: 1.8 (calc) (ref 1.0–2.5)
ALT: 36 U/L — ABNORMAL HIGH (ref 6–29)
AST: 27 U/L (ref 10–35)
Albumin: 4.2 g/dL (ref 3.6–5.1)
Alkaline phosphatase (APISO): 76 U/L (ref 37–153)
BUN/Creatinine Ratio: 14 (calc) (ref 6–22)
BUN: 16 mg/dL (ref 7–25)
CO2: 26 mmol/L (ref 20–32)
Calcium: 9.9 mg/dL (ref 8.6–10.4)
Chloride: 105 mmol/L (ref 98–110)
Creat: 1.14 mg/dL — ABNORMAL HIGH (ref 0.50–0.99)
GFR, Est African American: 59 mL/min/{1.73_m2} — ABNORMAL LOW (ref >=60)
GFR, Est Non African American: 51 mL/min/{1.73_m2} — ABNORMAL LOW (ref >=60)
Globulin: 2.3 g/dL (calc) (ref 1.9–3.7)
Glucose, Bld: 97 mg/dL (ref 65–99)
Potassium: 4.3 mmol/L (ref 3.5–5.3)
Sodium: 141 mmol/L (ref 135–146)
Total Bilirubin: 0.5 mg/dL (ref 0.2–1.2)
Total Protein: 6.5 g/dL (ref 6.1–8.1)

## 2019-03-10 LAB — LIPID PANEL W/REFLEX DIRECT LDL
Cholesterol: 171 mg/dL (ref <200)
HDL: 50 mg/dL (ref >=50)
LDL Cholesterol (Calc): 99 mg/dL (calc)
Non-HDL Cholesterol (Calc): 121 mg/dL (calc) (ref <130)
Total CHOL/HDL Ratio: 3.4 (calc) (ref <5.0)
Triglycerides: 120 mg/dL (ref <150)

## 2019-03-10 LAB — TSH: TSH: 0.12 mIU/L — ABNORMAL LOW (ref 0.40–4.50)

## 2019-03-10 MED ORDER — LEVOTHYROXINE SODIUM 125 MCG PO TABS: 125.0000 ug | ORAL_TABLET | Freq: Every day | ORAL | 0 refills | Status: DC

## 2019-03-10 MED FILL — LEVOTHYROXINE 125 MCG TAB: 125 | 30 days supply | Qty: 30 | Fill #0

## 2019-03-10 NOTE — Progress Notes (Signed)
Cholesterol looks great. TG have come down.  Kidney function stable. GFR 51. Reminder to avoid any NSAID products.  On liver enzyme. More alcohol or tylenol recently?  TSH is low meaning you are in a HYPER thyroid and over supplemented. Will send over decrease dose of synthroid. Recheck liver enzyme and TSH in 4 weeks. (hepatic function panel and TSH)

## 2019-03-13 MED FILL — SAXENDA 18 MG/3 ML PEN: 18 | 30 days supply | Qty: 15 | Fill #0

## 2019-03-15 ENCOUNTER — Other Ambulatory Visit: Payer: Self-pay | Admitting: Neurology

## 2019-03-15 DIAGNOSIS — R7989 Other specified abnormal findings of blood chemistry: Secondary | ICD-10-CM

## 2019-03-15 DIAGNOSIS — R748 Abnormal levels of other serum enzymes: Secondary | ICD-10-CM

## 2019-03-15 DIAGNOSIS — E039 Hypothyroidism, unspecified: Secondary | ICD-10-CM

## 2019-03-29 MED FILL — HYDROCHLOROTHIAZIDE 12.5 MG: 12.5 | 90 days supply | Qty: 90 | Fill #1

## 2019-04-12 DIAGNOSIS — G4733 Obstructive sleep apnea (adult) (pediatric): Secondary | ICD-10-CM | POA: Diagnosis not present

## 2019-04-18 ENCOUNTER — Other Ambulatory Visit: Payer: Self-pay | Admitting: Physician Assistant

## 2019-04-18 MED FILL — LEVOTHYROXINE 125 MCG TAB: 125 | 30 days supply | Qty: 30 | Fill #0

## 2019-05-01 MED FILL — ESOMEPRAZOLE MAG DR 40 MG C: 40 | 90 days supply | Qty: 90 | Fill #1

## 2019-05-01 MED FILL — LISINOPRIL 2.5 MG TABLET: 2.5 | 90 days supply | Qty: 90 | Fill #1

## 2019-05-15 ENCOUNTER — Other Ambulatory Visit: Payer: Self-pay | Admitting: Physician Assistant

## 2019-05-15 DIAGNOSIS — E781 Pure hyperglyceridemia: Secondary | ICD-10-CM

## 2019-05-15 DIAGNOSIS — E782 Mixed hyperlipidemia: Secondary | ICD-10-CM

## 2019-05-15 MED FILL — CITALOPRAM HBR 20 MG TABLET: 20 | 90 days supply | Qty: 45 | Fill #1

## 2019-05-15 MED FILL — LEVOTHYROXINE 125 MCG TAB: 125 | 30 days supply | Qty: 30 | Fill #0

## 2019-05-15 MED FILL — ROSUVASTATIN CALCIUM 10 MG: 10 | 90 days supply | Qty: 90 | Fill #0

## 2019-05-22 ENCOUNTER — Encounter: Payer: Self-pay | Admitting: Physician Assistant

## 2019-05-22 ENCOUNTER — Other Ambulatory Visit: Payer: Self-pay

## 2019-05-22 ENCOUNTER — Ambulatory Visit (INDEPENDENT_AMBULATORY_CARE_PROVIDER_SITE_OTHER): Payer: 59 | Admitting: Physician Assistant

## 2019-05-22 VITALS — BP 124/72 | HR 86 | Temp 98.1°F | Ht 64.5 in | Wt 212.0 lb

## 2019-05-22 DIAGNOSIS — R140 Abdominal distension (gaseous): Secondary | ICD-10-CM

## 2019-05-22 DIAGNOSIS — H8112 Benign paroxysmal vertigo, left ear: Secondary | ICD-10-CM

## 2019-05-22 DIAGNOSIS — R1011 Right upper quadrant pain: Secondary | ICD-10-CM | POA: Diagnosis not present

## 2019-05-22 DIAGNOSIS — M549 Dorsalgia, unspecified: Secondary | ICD-10-CM | POA: Diagnosis not present

## 2019-05-22 DIAGNOSIS — R198 Other specified symptoms and signs involving the digestive system and abdomen: Secondary | ICD-10-CM | POA: Diagnosis not present

## 2019-05-22 DIAGNOSIS — R1031 Right lower quadrant pain: Secondary | ICD-10-CM

## 2019-05-22 LAB — POCT URINALYSIS DIPSTICK
Bilirubin, UA: NEGATIVE
Blood, UA: NEGATIVE
Glucose, UA: NEGATIVE
Ketones, UA: NEGATIVE
Nitrite, UA: NEGATIVE
Protein, UA: NEGATIVE
Spec Grav, UA: >=1.03 — AB (ref 1.010–1.025)
Urobilinogen, UA: 0.2 E.U./dL
pH, UA: 5.5 (ref 5.0–8.0)

## 2019-05-22 NOTE — Patient Instructions (Signed)
Will get STAT CT.  Will get Labs.

## 2019-05-22 NOTE — Progress Notes (Signed)
Subjective:    Patient ID: [name], female    DOB: [birth year], 63 y.o.   MRN: [id number]  HPI  Pt is a 63 yo obese female with HTN, hypothyroidism, CKD, HLD who presents to the clinic with one week of right upper quadrant pain that radiates to back and up into right shoulder blade. At times pain was severe and very sharp and would rate 10/10. She denied any diarrhea or constipation.she denies any nausea or vomiting.  It was worse after eating anything. Pt denies any melena or hematochezia. 2 days ago it go much better suddenly but decided to keep this appt. She denies any urinary frequency or pain. She denies any fever, chills, body aches. She is on saxenda of weight loss but has been since march she is down to 212 from 224. She is on nexium for GERD.   Pt has hx of vertigo and resolving with vestibular rehab. She has noticed some vertigo when she turns her head to the left that just started this morning. She has not start epleys.   .. Active Ambulatory Problems    Diagnosis Date Noted  . UTI (urinary tract infection) 07/14/2012  . Other and unspecified hyperlipidemia 07/14/2012  . MVP (mitral valve prolapse) 07/14/2012  . GERD (gastroesophageal reflux disease) 07/14/2012  . Allergic rhinitis 07/14/2012  . Mitral regurgitation 07/14/2012  . S/P hysterectomy 07/14/2012  . Essential hypertension, benign 04/29/2013  . Wheezing 04/29/2013  . Insomnia 04/29/2013  . Unspecified vitamin D deficiency 04/29/2013  . Migraine headache 04/29/2013  . Anxiety state 04/29/2013  . Unspecified hypothyroidism 04/29/2013  . Obesity (BMI 30-39.9) 07/17/2013  . Cough 08/15/2013  . Arthritis of left knee 09/14/2013  . Status post total knee replacement 09/14/2013  . Abdominal pain, chronic, right upper quadrant 11/16/2013  . Candidiasis of female genitalia 11/16/2013  . Elevated liver enzymes 11/16/2013  . Folliculitis [id number]  . Nausea alone 11/18/2013  . Abnormal transaminases 11/18/2013   . Hyperlipidemia 11/22/2014  . Single kidney 11/22/2014  . Hypothyroidism 11/22/2014  . OSA (obstructive sleep apnea) 12/23/2015  . Daytime sleepiness 01/24/2017  . Carpal tunnel syndrome, left upper limb 02/25/2017  . Status post carpal tunnel release 03/11/2017  . Hypertriglyceridemia 07/14/2017  . Elevated fasting glucose 07/14/2017  . Chronic pain of right knee 09/13/2017  . Unilateral primary osteoarthritis, right knee 09/27/2017  . Pre-diabetes 10/19/2017  . Pain in thumb joint with movement of right hand 10/19/2017  . Class 2 obesity due to excess calories without serious comorbidity with body mass index (BMI) of 37.0 to 37.9 in adult 10/19/2017  . Other meniscus derangements, posterior horn of medial meniscus, right knee 11/24/2017  . Pain of right thumb 11/24/2017  . Trigger thumb, right thumb 11/24/2017  . BPPV (benign paroxysmal positional vertigo), right 02/20/2018  . Concussion wth loss of consciousness of 30 minutes or less 02/20/2018  . Fall 02/20/2018  . CKD (chronic kidney disease) stage 3, GFR 30-59 ml/min 03/10/2019  . BPPV (benign paroxysmal positional vertigo), left 05/22/2019  . Pain radiating to back 05/23/2019   Resolved Ambulatory Problems    Diagnosis Date Noted  . Hypothyroidism 07/14/2012   Past Medical History:  Diagnosis Date  . Acute medial meniscal tear   . Allergy   . Anxiety   . Aortic insufficiency   . Aortic regurgitation   . Arthritis   . Carpal tunnel syndrome   . Chronic kidney disease   . Dysrhythmia   . Mitral valve prolapse   .  Need for SBE (subacute bacterial endocarditis) prophylaxis   . Pilonidal cyst without mention of abscess   . Pneumonia Aug 14 2012  . PONV (postoperative nausea and vomiting)   . Sleep apnea   . Thyroid disease   . Transplanted kidney removed         Review of Systems    see HPI.  Objective:   Physical Exam Vitals signs reviewed.  Constitutional:      Appearance: She is well-developed.   HENT:     Head: Normocephalic.  Cardiovascular:     Rate and Rhythm: Normal rate.  Pulmonary:     Effort: Pulmonary effort is normal.     Breath sounds: Normal breath sounds.  Abdominal:     General: Bowel sounds are normal. There is distension.     Palpations: Abdomen is soft.     Tenderness: There is abdominal tenderness in the right upper quadrant and right lower quadrant. There is right CVA tenderness and guarding. There is no left CVA tenderness or rebound. Negative signs include Murphy's sign and psoas sign.     Hernia: No hernia is present.  Skin:    General: Skin is warm.     Findings: No rash.  Neurological:     General: No focal deficit present.     Mental Status: She is alert and oriented to person, place, and time.     Comments: dix hallpike to the left positive for nystagmus.   Psychiatric:        Mood and Affect: Mood normal.           Assessment & Plan:  Marland KitchenMarland KitchenTamyla was seen today for abdominal pain and nausea.  Diagnoses and all orders for this visit:  Right upper quadrant pain -     COMPLETE METABOLIC PANEL WITH GFR -     Lipase -     CBC with Differential/Platelet  Right lower quadrant guarding -     COMPLETE METABOLIC PANEL WITH GFR -     Lipase -     CBC with Differential/Platelet -     POCT urinalysis dipstick -     Urine Culture  Abdominal distension (gaseous) -     CT Abdomen Pelvis W Contrast -     POCT urinalysis dipstick -     Urine Culture  Right lower quadrant abdominal pain -     CT Abdomen Pelvis W Contrast -     POCT urinalysis dipstick -     Urine Culture  BPPV (benign paroxysmal positional vertigo), left  Pain radiating to back    .Marland Kitchen Results for orders placed or performed in visit on 05/22/19  COMPLETE METABOLIC PANEL WITH GFR  Result Value Ref Range   Glucose, Bld 88 65 - 139 mg/dL   BUN 16 7 - 25 mg/dL   Creat 1.09 (H) 0.50 - 0.99 mg/dL   GFR, Est Non African American 54 (L) > OR = 60 mL/min/1.65m2   GFR, Est  African American 62 > OR = 60 mL/min/1.8m2   BUN/Creatinine Ratio 15 6 - 22 (calc)   Sodium 140 135 - 146 mmol/L   Potassium 4.1 3.5 - 5.3 mmol/L   Chloride 104 98 - 110 mmol/L   CO2 30 20 - 32 mmol/L   Calcium 10.1 8.6 - 10.4 mg/dL   Total Protein 6.7 6.1 - 8.1 g/dL   Albumin 4.3 3.6 - 5.1 g/dL   Globulin 2.4 1.9 - 3.7 g/dL (calc)   AG Ratio 1.8 1.0 -  2.5 (calc)   Total Bilirubin 0.7 0.2 - 1.2 mg/dL   Alkaline phosphatase (APISO) 78 37 - 153 U/L   AST 29 10 - 35 U/L   ALT 43 (H) 6 - 29 U/L  Lipase  Result Value Ref Range   Lipase 83 (H) 7 - 60 U/L  CBC with Differential/Platelet  Result Value Ref Range   WBC 7.3 3.8 - 10.8 Thousand/uL   RBC 6.13 (H) 3.80 - 5.10 Million/uL   Hemoglobin 15.3 11.7 - 15.5 g/dL   HCT 47.4 (H) 35.0 - 45.0 %   MCV 77.3 (L) 80.0 - 100.0 fL   MCH 25.0 (L) 27.0 - 33.0 pg   MCHC 32.3 32.0 - 36.0 g/dL   RDW 15.2 (H) 11.0 - 15.0 %   Platelets 496 (H) 140 - 400 Thousand/uL   MPV 9.8 7.5 - 12.5 fL   Neutro Abs 4,679 1,500 - 7,800 cells/uL   Lymphs Abs 1,584 850 - 3,900 cells/uL   Absolute Monocytes 635 200 - 950 cells/uL   Eosinophils Absolute 314 15 - 500 cells/uL   Basophils Absolute 88 0 - 200 cells/uL   Neutrophils Relative % 64.1 %   Total Lymphocyte 21.7 %   Monocytes Relative 8.7 %   Eosinophils Relative 4.3 %   Basophils Relative 1.2 %  POCT urinalysis dipstick  Result Value Ref Range   Color, UA yellow    Clarity, UA clear    Glucose, UA Negative Negative   Bilirubin, UA negative    Ketones, UA negative    Spec Grav, UA >=1.030 (A) 1.010 - 1.025   Blood, UA negative    pH, UA 5.5 5.0 - 8.0   Protein, UA Negative Negative   Urobilinogen, UA 0.2 0.2 or 1.0 E.U./dL   Nitrite, UA negative    Leukocytes, UA Small (1+) (A) Negative   Appearance     Odor     Will culture urine but do not suspect infection.  DDX cholecystitis, pancreatitis, appendicitis, diverticulitis.  With abdominal guarding on exam I would like CT of abdomen.  CBC,  CMP, lipase ordered today.   She is on saxenda and can cause pancreatitis. She has been on for a few months and symptoms are improving.   epley maneuvers 3 rounds 3 times a day until resolved. If not resolving need to go back and do vestibular rehab again.

## 2019-05-23 ENCOUNTER — Ambulatory Visit (INDEPENDENT_AMBULATORY_CARE_PROVIDER_SITE_OTHER): Payer: 59

## 2019-05-23 ENCOUNTER — Other Ambulatory Visit: Payer: Self-pay

## 2019-05-23 ENCOUNTER — Other Ambulatory Visit: Payer: Self-pay | Admitting: Neurology

## 2019-05-23 DIAGNOSIS — R7401 Elevation of levels of liver transaminase levels: Secondary | ICD-10-CM

## 2019-05-23 DIAGNOSIS — R1031 Right lower quadrant pain: Secondary | ICD-10-CM

## 2019-05-23 DIAGNOSIS — R109 Unspecified abdominal pain: Secondary | ICD-10-CM | POA: Diagnosis not present

## 2019-05-23 DIAGNOSIS — R748 Abnormal levels of other serum enzymes: Secondary | ICD-10-CM

## 2019-05-23 DIAGNOSIS — R140 Abdominal distension (gaseous): Secondary | ICD-10-CM | POA: Diagnosis not present

## 2019-05-23 DIAGNOSIS — M549 Dorsalgia, unspecified: Secondary | ICD-10-CM | POA: Insufficient documentation

## 2019-05-23 LAB — CBC WITH DIFFERENTIAL/PLATELET
Absolute Monocytes: 635 cells/uL (ref 200–950)
Basophils Absolute: 88 cells/uL (ref 0–200)
Basophils Relative: 1.2 %
Eosinophils Absolute: 314 cells/uL (ref 15–500)
Eosinophils Relative: 4.3 %
HCT: 47.4 % — ABNORMAL HIGH (ref 35.0–45.0)
Hemoglobin: 15.3 g/dL (ref 11.7–15.5)
Lymphs Abs: 1584 cells/uL (ref 850–3900)
MCH: 25 pg — ABNORMAL LOW (ref 27.0–33.0)
MCHC: 32.3 g/dL (ref 32.0–36.0)
MCV: 77.3 fL — ABNORMAL LOW (ref 80.0–100.0)
MPV: 9.8 fL (ref 7.5–12.5)
Monocytes Relative: 8.7 %
Neutro Abs: 4679 cells/uL (ref 1500–7800)
Neutrophils Relative %: 64.1 %
Platelets: 496 10*3/uL — ABNORMAL HIGH (ref 140–400)
RBC: 6.13 10*6/uL — ABNORMAL HIGH (ref 3.80–5.10)
RDW: 15.2 % — ABNORMAL HIGH (ref 11.0–15.0)
Total Lymphocyte: 21.7 %
WBC: 7.3 10*3/uL (ref 3.8–10.8)

## 2019-05-23 LAB — COMPLETE METABOLIC PANEL WITH GFR
AG Ratio: 1.8 (calc) (ref 1.0–2.5)
ALT: 43 U/L — ABNORMAL HIGH (ref 6–29)
AST: 29 U/L (ref 10–35)
Albumin: 4.3 g/dL (ref 3.6–5.1)
Alkaline phosphatase (APISO): 78 U/L (ref 37–153)
BUN/Creatinine Ratio: 15 (calc) (ref 6–22)
BUN: 16 mg/dL (ref 7–25)
CO2: 30 mmol/L (ref 20–32)
Calcium: 10.1 mg/dL (ref 8.6–10.4)
Chloride: 104 mmol/L (ref 98–110)
Creat: 1.09 mg/dL — ABNORMAL HIGH (ref 0.50–0.99)
GFR, Est African American: 62 mL/min/{1.73_m2} (ref >=60)
GFR, Est Non African American: 54 mL/min/{1.73_m2} — ABNORMAL LOW (ref >=60)
Globulin: 2.4 g/dL (calc) (ref 1.9–3.7)
Glucose, Bld: 88 mg/dL (ref 65–139)
Potassium: 4.1 mmol/L (ref 3.5–5.3)
Sodium: 140 mmol/L (ref 135–146)
Total Bilirubin: 0.7 mg/dL (ref 0.2–1.2)
Total Protein: 6.7 g/dL (ref 6.1–8.1)

## 2019-05-23 LAB — LIPASE: Lipase: 83 U/L — ABNORMAL HIGH (ref 7–60)

## 2019-05-23 MED ORDER — IOHEXOL 300 MG/ML  SOLN
100.0000 mL | Freq: Once | INTRAMUSCULAR | Status: AC | PRN
  Administered 2019-05-23: 80 mL via INTRAVENOUS

## 2019-05-23 NOTE — Progress Notes (Signed)
No gallstones or wall thickening.  Pancreas appears normal.  Normal appendix.   You do have some aortic plaque accumulation which crestor can help with and some degenerative changes of spine.   I think saxenda could be causing some pancreatitis symptoms even with mild elevation of lipase. Any recent changes with dose? We may just back down on dose a little and see if improves.

## 2019-05-23 NOTE — Progress Notes (Signed)
[name],   Kidney function better than 2 months ago. ALT up from 2 months ago. LIpase just barely out of normal range. I do wonder if saxenda could be causing some mild pancreatitis. Get CT and will call with results of that later today.

## 2019-05-23 NOTE — Progress Notes (Signed)
Lets back down to 2.4mg  daily. Recheck lipase in 2 weeks.

## 2019-05-24 LAB — URINE CULTURE
MICRO NUMBER:: 979311
SPECIMEN QUALITY:: ADEQUATE

## 2019-05-24 NOTE — Progress Notes (Signed)
[name],   Mixed bacteria found on urine culture that is suggestive of contamination no infection.   [name]

## 2019-05-31 MED FILL — SAXENDA 18 MG/3 ML PEN: 18 | 30 days supply | Qty: 15 | Fill #1

## 2019-06-09 ENCOUNTER — Ambulatory Visit (INDEPENDENT_AMBULATORY_CARE_PROVIDER_SITE_OTHER): Payer: 59 | Admitting: Physician Assistant

## 2019-06-09 ENCOUNTER — Ambulatory Visit: Payer: 59 | Admitting: Physician Assistant

## 2019-06-09 ENCOUNTER — Other Ambulatory Visit: Payer: Self-pay

## 2019-06-09 VITALS — BP 125/64 | HR 75 | Ht 64.5 in | Wt 210.0 lb

## 2019-06-09 DIAGNOSIS — E6609 Other obesity due to excess calories: Secondary | ICD-10-CM

## 2019-06-09 DIAGNOSIS — R748 Abnormal levels of other serum enzymes: Secondary | ICD-10-CM

## 2019-06-09 DIAGNOSIS — Z6835 Body mass index (BMI) 35.0-35.9, adult: Secondary | ICD-10-CM

## 2019-06-09 DIAGNOSIS — E039 Hypothyroidism, unspecified: Secondary | ICD-10-CM

## 2019-06-09 MED ORDER — LEVOTHYROXINE SODIUM 125 MCG PO TABS: 125.0000 ug | ORAL_TABLET | Freq: Every day | ORAL | 0 refills | Status: DC

## 2019-06-09 MED FILL — LEVOTHYROXINE 125 MCG TAB: 125 | 90 days supply | Qty: 90 | Fill #0

## 2019-06-09 NOTE — Progress Notes (Signed)
Subjective:    Patient ID: [name], female    DOB: March 14, 1956, 63 y.o.   MRN: [id number]  HPI  Pt is a 63 yo obese female who presents to the clinic to follow up after episode of RUQ pain earlier this month with elevated lipase. Dx of mild pancreatitis with no other findings for symptoms on CT scan. Unclear exact etiology but suspect it could have something to do with saxenda. Decreased saxenda to 2.4mg  and lipase recheck went down. She has had problems with TG in the past but improved ant 120 02/2019.  She does not want to stop saxenda. She is doing really well. She is down from 229 to 210. She has incorporated with IF 16 to 8 and feels like she is going to be able to make lifestyle changes. She is more active.    Her last TSH levels were low at .12. levothyroxine decreased but not rechecked.   .. Active Ambulatory Problems    Diagnosis Date Noted  . UTI (urinary tract infection) 07/14/2012  . Other and unspecified hyperlipidemia 07/14/2012  . MVP (mitral valve prolapse) 07/14/2012  . GERD (gastroesophageal reflux disease) 07/14/2012  . Allergic rhinitis 07/14/2012  . Mitral regurgitation 07/14/2012  . S/P hysterectomy 07/14/2012  . Essential hypertension, benign 04/29/2013  . Wheezing 04/29/2013  . Insomnia 04/29/2013  . Unspecified vitamin D deficiency 04/29/2013  . Migraine headache 04/29/2013  . Anxiety state 04/29/2013  . Unspecified hypothyroidism 04/29/2013  . Obesity (BMI 30-39.9) 07/17/2013  . Cough 08/15/2013  . Arthritis of left knee 09/14/2013  . Status post total knee replacement 09/14/2013  . Abdominal pain, chronic, right upper quadrant 11/16/2013  . Candidiasis of female genitalia 11/16/2013  . Elevated liver enzymes 11/16/2013  . Folliculitis [id number]  . Nausea alone 11/18/2013  . Abnormal transaminases 11/18/2013  . Hyperlipidemia 11/22/2014  . Single kidney 11/22/2014  . Hypothyroidism 11/22/2014  . OSA (obstructive sleep apnea) 12/23/2015  .  Daytime sleepiness 01/24/2017  . Carpal tunnel syndrome, left upper limb 02/25/2017  . Status post carpal tunnel release 03/11/2017  . Hypertriglyceridemia 07/14/2017  . Elevated fasting glucose 07/14/2017  . Chronic pain of right knee 09/13/2017  . Unilateral primary osteoarthritis, right knee 09/27/2017  . Pre-diabetes 10/19/2017  . Pain in thumb joint with movement of right hand 10/19/2017  . Class 2 obesity due to excess calories without serious comorbidity with body mass index (BMI) of 35.0 to 35.9 in adult 10/19/2017  . Other meniscus derangements, posterior horn of medial meniscus, right knee 11/24/2017  . Pain of right thumb 11/24/2017  . Trigger thumb, right thumb 11/24/2017  . BPPV (benign paroxysmal positional vertigo), right 02/20/2018  . Concussion wth loss of consciousness of 30 minutes or less 02/20/2018  . Fall 02/20/2018  . CKD (chronic kidney disease) stage 3, GFR 30-59 ml/min 03/10/2019  . BPPV (benign paroxysmal positional vertigo), left 05/22/2019  . Pain radiating to back 05/23/2019  . Elevated lipase 06/12/2019   Resolved Ambulatory Problems    Diagnosis Date Noted  . Hypothyroidism 07/14/2012   Past Medical History:  Diagnosis Date  . Acute medial meniscal tear   . Allergy   . Anxiety   . Aortic insufficiency   . Aortic regurgitation   . Arthritis   . Carpal tunnel syndrome   . Chronic kidney disease   . Dysrhythmia   . Mitral valve prolapse   . Need for SBE (subacute bacterial endocarditis) prophylaxis   . Pilonidal cyst without mention of abscess   .  Pneumonia Aug 14 2012  . PONV (postoperative nausea and vomiting)   . Sleep apnea   . Thyroid disease   . Transplanted kidney removed        Review of Systems See HPI.     Objective:   Physical Exam Vitals signs reviewed.  Constitutional:      Appearance: Normal appearance. She is obese.  HENT:     Head: Normocephalic.  Cardiovascular:     Rate and Rhythm: Normal rate and regular  rhythm.     Pulses: Normal pulses.  Pulmonary:     Effort: Pulmonary effort is normal.     Breath sounds: Normal breath sounds.  Abdominal:     General: Bowel sounds are normal. There is distension.     Palpations: Abdomen is soft. There is no mass.     Tenderness: There is no abdominal tenderness. There is no right CVA tenderness, left CVA tenderness, guarding or rebound.  Neurological:     General: No focal deficit present.     Mental Status: She is alert and oriented to person, place, and time.  Psychiatric:        Mood and Affect: Mood normal.        Judgment: Judgment normal.           Assessment & Plan:  Marland KitchenMarland KitchenKylla was seen today for weight check.  Diagnoses and all orders for this visit:  Elevated lipase -     Lipase  Hypothyroidism, unspecified type -     levothyroxine (SYNTHROID) 125 MCG tablet; Take 1 tablet (125 mcg total) by mouth daily. -     TSH  Class 2 obesity due to excess calories without serious comorbidity with body mass index (BMI) of 35.0 to 35.9 in adult   Stay on same levothyroxine dose for now. Get checked with lipase in 4-6 weeks at 3mg  dose. If lipase rising or more pancreatitis symptoms will have to decrease saxenda again. Continue with diet and exercise. Continue with IF. Goal to be under 200 at nest 3 month follow up.

## 2019-06-09 NOTE — Progress Notes (Signed)
w

## 2019-06-12 ENCOUNTER — Encounter: Payer: Self-pay | Admitting: Physician Assistant

## 2019-06-12 DIAGNOSIS — R748 Abnormal levels of other serum enzymes: Secondary | ICD-10-CM | POA: Insufficient documentation

## 2019-06-27 MED FILL — HYDROCHLOROTHIAZIDE 12.5 MG: 12.5 | 90 days supply | Qty: 90 | Fill #2

## 2019-07-17 ENCOUNTER — Other Ambulatory Visit: Payer: Self-pay | Admitting: Physician Assistant

## 2019-07-17 DIAGNOSIS — Z1231 Encounter for screening mammogram for malignant neoplasm of breast: Secondary | ICD-10-CM

## 2019-07-24 MED FILL — ESOMEPRAZOLE MAG DR 40 MG C: 40 | 90 days supply | Qty: 90 | Fill #2

## 2019-07-24 MED FILL — LISINOPRIL 2.5 MG TABLET: 2.5 | 90 days supply | Qty: 90 | Fill #2

## 2019-07-26 DIAGNOSIS — G4733 Obstructive sleep apnea (adult) (pediatric): Secondary | ICD-10-CM | POA: Diagnosis not present

## 2019-07-26 IMAGING — RF DG ESOPHAGUS
7 of 9 series · 16 of 24 positions shown · non-contrast
Comparison: None.

CLINICAL DATA: Regurgitation of food.  Dysphagia.

EXAM:
ESOPHOGRAM / BARIUM SWALLOW / BARIUM TABLET STUDY
TECHNIQUE: Combined double contrast and single contrast examination performed
using effervescent crystals, thick barium liquid, and thin barium
liquid. The patient was observed with fluoroscopy swallowing a 13 mm
barium sulphate tablet.
FLUOROSCOPY TIME:  Fluoroscopy Time:  1 minutes 48 seconds
Radiation Exposure Index (if provided by the fluoroscopic device):
Number of Acquired Spot Images: 0

[Series 1: cp_standard · 0.37mm/px · 3 of 107 frames shown (1 of 7)]
[frame 17/107]
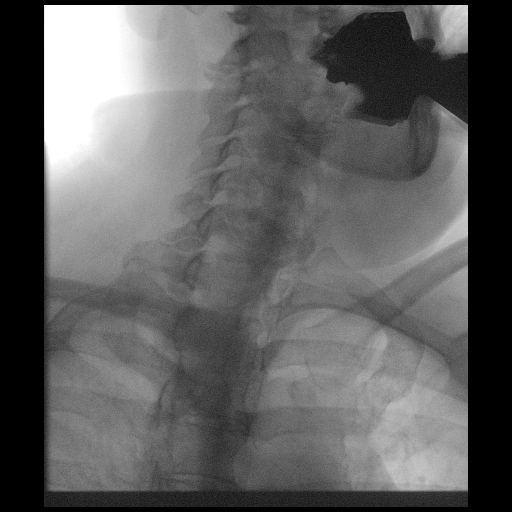
[frame 54/107]
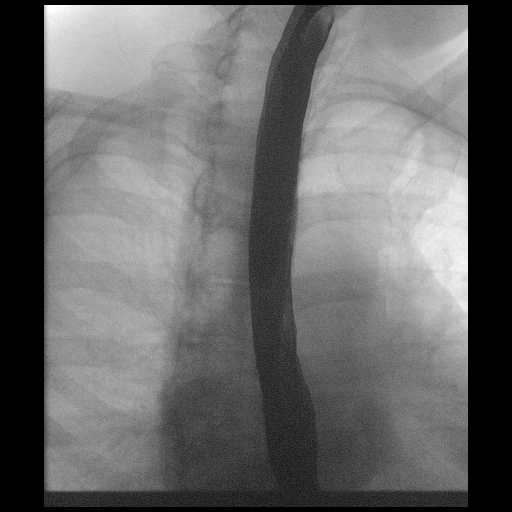
[frame 91/107]
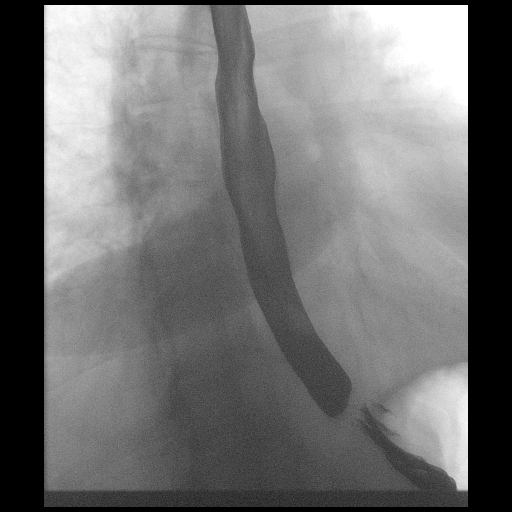

[Series 2: cp_standard · 0.35mm/px · 2 of 60 frames shown (2 of 7)]
[frame 52/60]
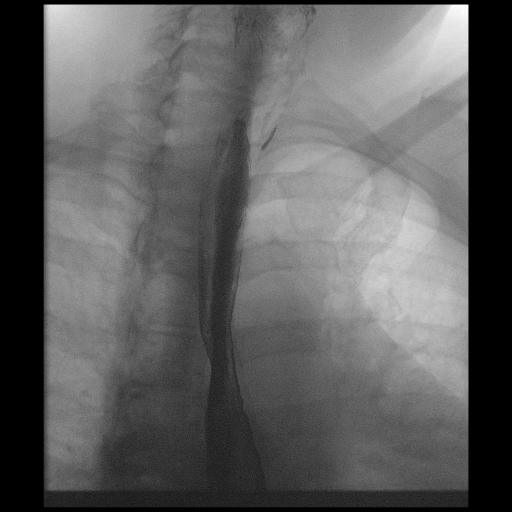
[frame 60/60]
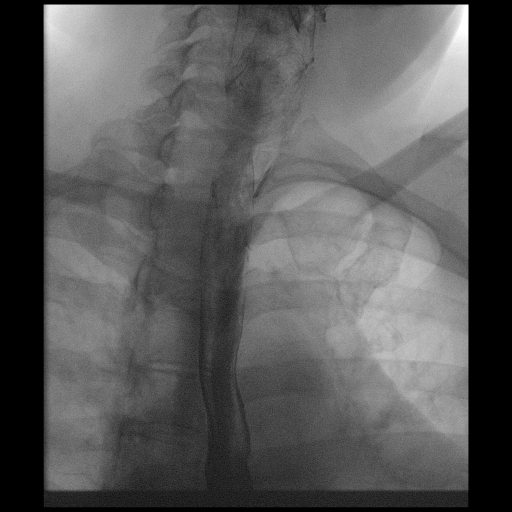

[Series 4: cp_standard · 0.35mm/px · 3 of 62 frames shown (3 of 7)]
[frame 10/62]
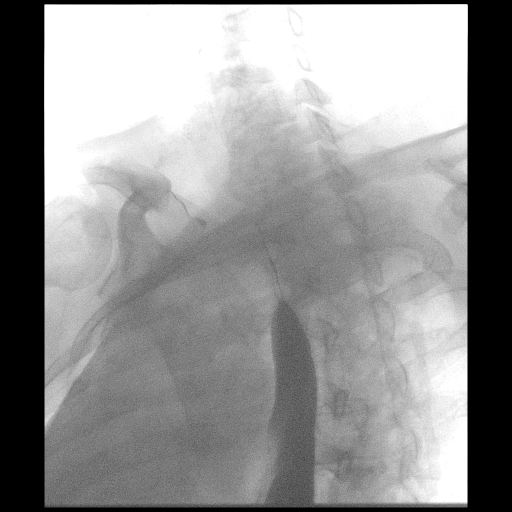
[frame 32/62]
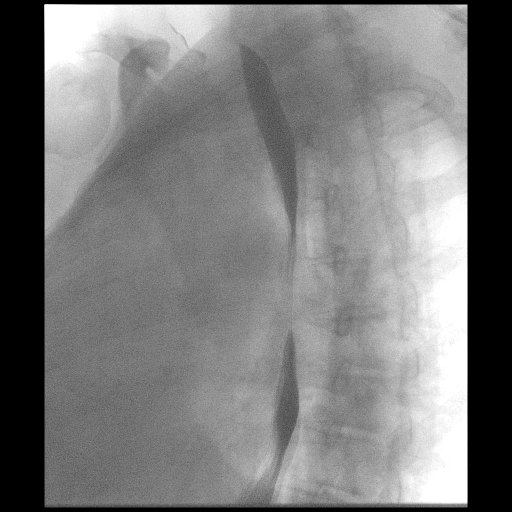
[frame 61/62]
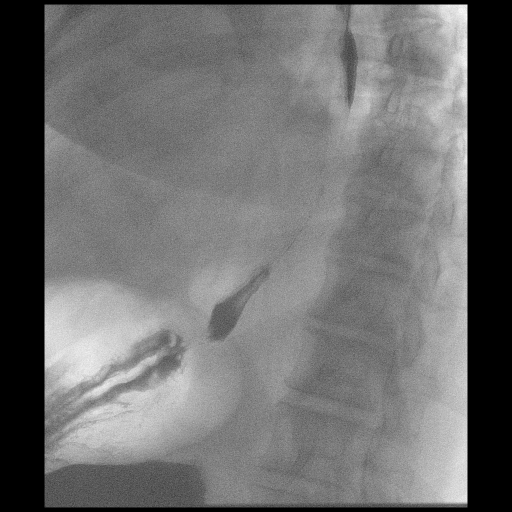

[Series 5: cp_standard · 0.35mm/px · 2 of 113 frames shown (4 of 7)]
[frame 57/113]
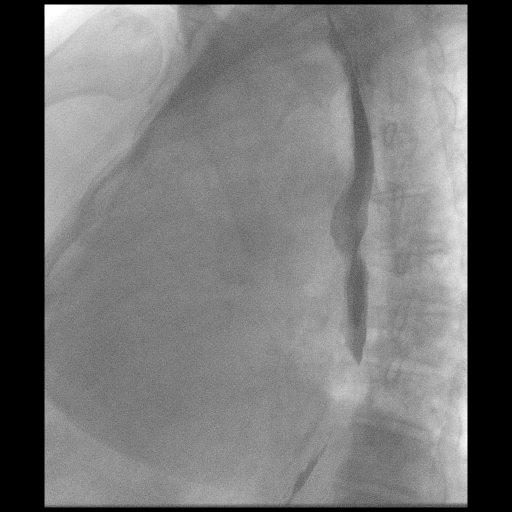
[frame 108/113]
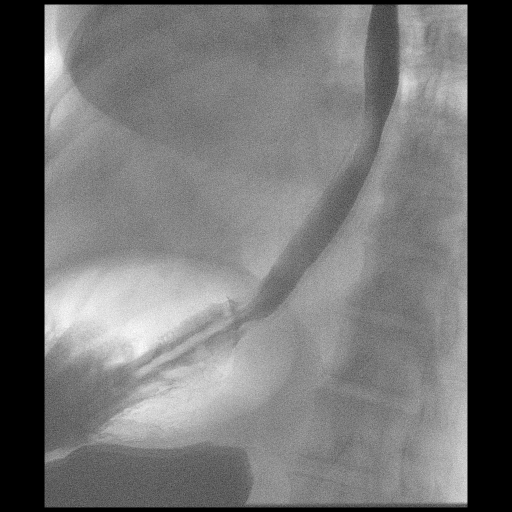

[Series 6: cp_standard · 0.34mm/px · 3 of 78 frames shown (5 of 7)]
[frame 12/78]
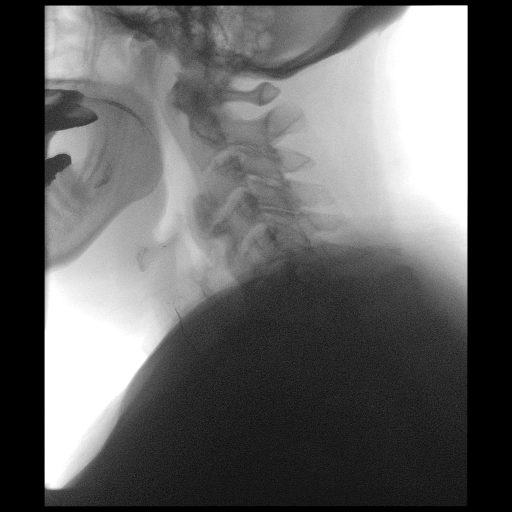
[frame 67/78]
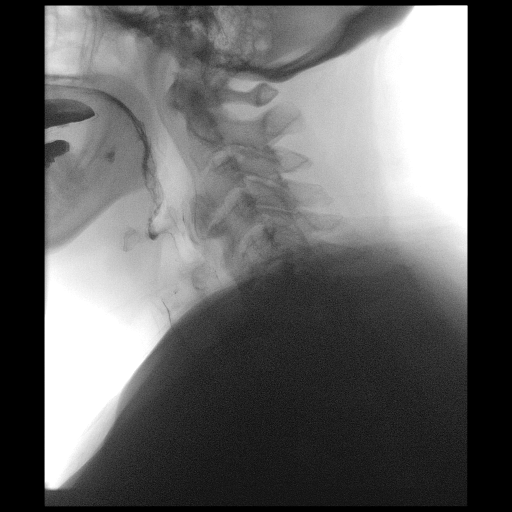
[frame 73/78]
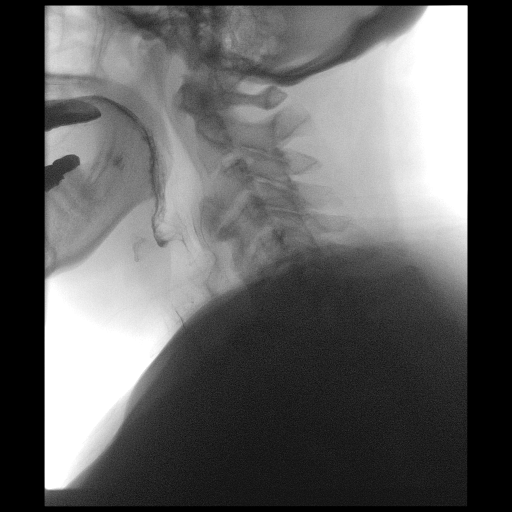

[Series 7: cp_standard · 0.34mm/px · 2 of 31 frames shown (6 of 7)]
[frame 16/31]
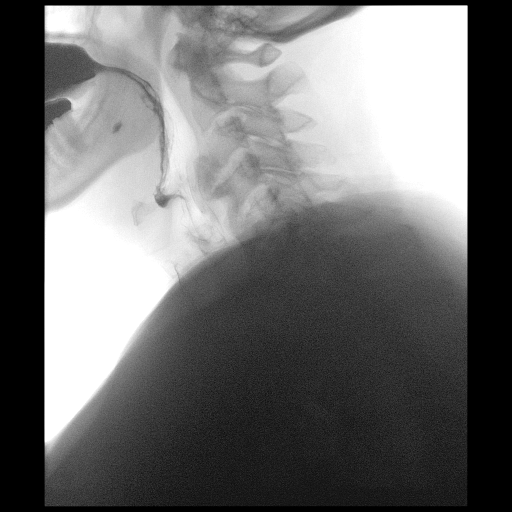
[frame 27/31]
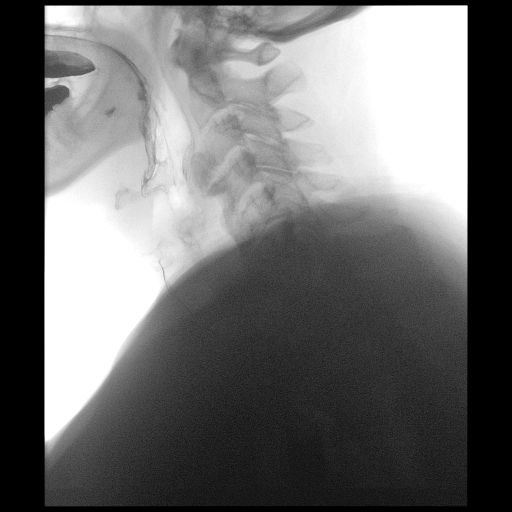

[Series 9: cp_standard · 0.17mm/px · 1 of 1 slices shown (7 of 7)]
[im 1/1]
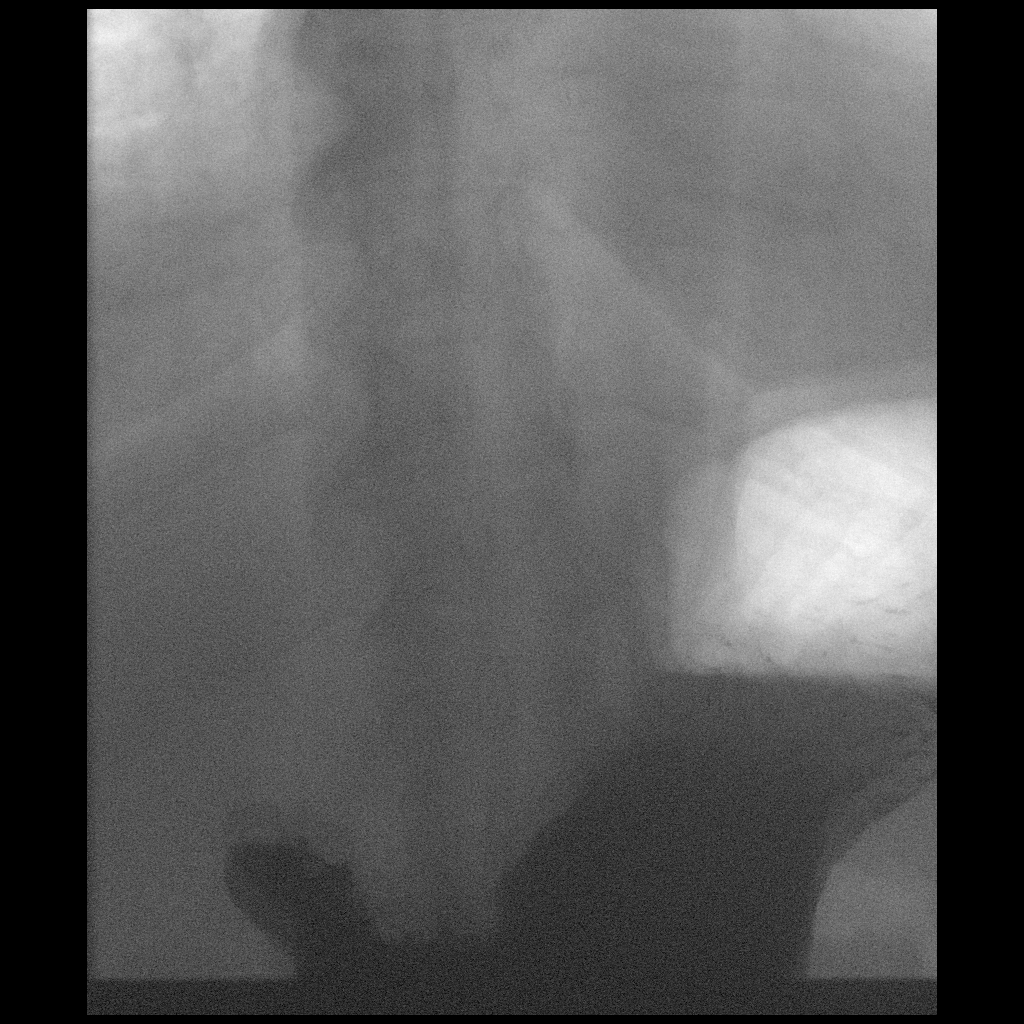

[16 of 24 positions shown; findings below may reference images not displayed]

FINDINGS: On the second swallow of barium, there was silent aspiration. No
additional aspiration identified. There is no coughing during the
study. Additional pharyngeal imaging was obtained of the lateral
projection without additional episode of aspiration. Pharyngeal
motility normal. No mass lesion. Prominent anterior osteophytes at
C3-4 C4-5 and C5-6, not likely related to aspiration.

Esophageal mucosa and motility normal. No mass or stricture in the
esophagus. No hiatal hernia or reflux identified.
IMPRESSION: One episode of silent aspiration early in the study. No additional
aspiration identified

Negative for esophageal stricture or mass. Negative for hiatal
hernia or reflux.

## 2019-08-14 MED FILL — CITALOPRAM HBR 20 MG TABLET: 20 | 90 days supply | Qty: 45 | Fill #2

## 2019-08-14 MED FILL — ROSUVASTATIN CALCIUM 10 MG: 10 | 90 days supply | Qty: 90 | Fill #1

## 2019-09-11 ENCOUNTER — Other Ambulatory Visit: Payer: Self-pay | Admitting: Physician Assistant

## 2019-09-11 DIAGNOSIS — E039 Hypothyroidism, unspecified: Secondary | ICD-10-CM

## 2019-09-11 MED FILL — LEVOTHYROXINE 125 MCG TAB: 125 | 30 days supply | Qty: 30 | Fill #0

## 2019-09-26 MED FILL — HYDROCHLOROTHIAZIDE 12.5 MG: 12.5 | 90 days supply | Qty: 90 | Fill #3

## 2019-10-12 ENCOUNTER — Other Ambulatory Visit: Payer: Self-pay | Admitting: Physician Assistant

## 2019-10-12 ENCOUNTER — Telehealth: Payer: Self-pay | Admitting: Neurology

## 2019-10-12 ENCOUNTER — Telehealth: Payer: Self-pay | Admitting: Physician Assistant

## 2019-10-12 DIAGNOSIS — E039 Hypothyroidism, unspecified: Secondary | ICD-10-CM

## 2019-10-12 NOTE — Telephone Encounter (Signed)
Received call from [facility] stating Levothyroxine manufacturer is changing and they need okay to fill RX. [phone].  Patient is overdue for TSH. She really needs to have drawn and redrawn in 6 weeks to make sure change in manufacturer doesn't cause any problems. Left message on machine for patient to call back to discuss before calling pharmacy back.

## 2019-10-12 NOTE — Telephone Encounter (Signed)
Lab for TSH entered. KG LPN

## 2019-10-12 NOTE — Telephone Encounter (Signed)
Patient made aware. She states she will have TSH drawn today.

## 2019-10-13 ENCOUNTER — Other Ambulatory Visit: Payer: Self-pay | Admitting: Physician Assistant

## 2019-10-13 DIAGNOSIS — E039 Hypothyroidism, unspecified: Secondary | ICD-10-CM

## 2019-10-13 LAB — TSH: TSH: 2.33 mIU/L (ref 0.40–4.50)

## 2019-10-13 MED ORDER — LEVOTHYROXINE SODIUM 125 MCG PO TABS: ORAL_TABLET | ORAL | 1 refills | Status: DC

## 2019-10-13 MED FILL — LEVOTHYROXINE SODIUM 125 MC: 125 | 90 days supply | Qty: 90 | Fill #0

## 2019-10-13 NOTE — Telephone Encounter (Signed)
Great!

## 2019-10-13 NOTE — Telephone Encounter (Signed)
Patient had TSH drawn. OKay given to pharmacy for manufacturer change. Patient is aware to get labs redrawn in 6 weeks.  [name].

## 2019-10-13 NOTE — Telephone Encounter (Signed)
Dem[facility] news. Thyroid looks much better. Perfectly within normal range. Sent refills.   -[name]

## 2019-10-24 MED FILL — LISINOPRIL 2.5 MG TABLET: 2.5 | 90 days supply | Qty: 90 | Fill #3

## 2019-10-24 MED FILL — ESOMEPRAZOLE MAG DR 40 MG C: 40 | 90 days supply | Qty: 90 | Fill #3

## 2019-11-07 DIAGNOSIS — G4733 Obstructive sleep apnea (adult) (pediatric): Secondary | ICD-10-CM | POA: Diagnosis not present

## 2019-11-14 ENCOUNTER — Other Ambulatory Visit: Payer: Self-pay | Admitting: Physician Assistant

## 2019-11-14 DIAGNOSIS — E781 Pure hyperglyceridemia: Secondary | ICD-10-CM

## 2019-11-14 DIAGNOSIS — E782 Mixed hyperlipidemia: Secondary | ICD-10-CM

## 2019-11-14 MED FILL — ROSUVASTATIN CALCIUM 10 MG: 10 | 90 days supply | Qty: 90 | Fill #0

## 2019-11-21 ENCOUNTER — Other Ambulatory Visit: Payer: Self-pay | Admitting: Physician Assistant

## 2019-11-21 DIAGNOSIS — F411 Generalized anxiety disorder: Secondary | ICD-10-CM

## 2019-11-21 MED FILL — CITALOPRAM HBR 20 MG TABLET: 20 | 30 days supply | Qty: 15 | Fill #0

## 2019-12-21 ENCOUNTER — Other Ambulatory Visit: Payer: Self-pay | Admitting: Physician Assistant

## 2019-12-21 DIAGNOSIS — F411 Generalized anxiety disorder: Secondary | ICD-10-CM

## 2019-12-25 MED FILL — CITALOPRAM HBR 20 MG TABLET: 20 | 14 days supply | Qty: 7 | Fill #0

## 2020-01-05 ENCOUNTER — Other Ambulatory Visit: Payer: Self-pay

## 2020-01-05 ENCOUNTER — Other Ambulatory Visit: Payer: Self-pay | Admitting: Physician Assistant

## 2020-01-05 ENCOUNTER — Ambulatory Visit (INDEPENDENT_AMBULATORY_CARE_PROVIDER_SITE_OTHER): Payer: 59 | Admitting: Physician Assistant

## 2020-01-05 ENCOUNTER — Encounter: Payer: Self-pay | Admitting: Physician Assistant

## 2020-01-05 VITALS — BP 130/56 | HR 63 | Ht 64.5 in | Wt 229.0 lb

## 2020-01-05 DIAGNOSIS — E039 Hypothyroidism, unspecified: Secondary | ICD-10-CM

## 2020-01-05 DIAGNOSIS — E782 Mixed hyperlipidemia: Secondary | ICD-10-CM

## 2020-01-05 DIAGNOSIS — Z0000 Encounter for general adult medical examination without abnormal findings: Secondary | ICD-10-CM | POA: Diagnosis not present

## 2020-01-05 DIAGNOSIS — F411 Generalized anxiety disorder: Secondary | ICD-10-CM

## 2020-01-05 DIAGNOSIS — Z6837 Body mass index (BMI) 37.0-37.9, adult: Secondary | ICD-10-CM | POA: Insufficient documentation

## 2020-01-05 DIAGNOSIS — L821 Other seborrheic keratosis: Secondary | ICD-10-CM

## 2020-01-05 DIAGNOSIS — Z6839 Body mass index (BMI) 39.0-39.9, adult: Secondary | ICD-10-CM | POA: Insufficient documentation

## 2020-01-05 DIAGNOSIS — R238 Other skin changes: Secondary | ICD-10-CM | POA: Diagnosis not present

## 2020-01-05 DIAGNOSIS — Z13820 Encounter for screening for osteoporosis: Secondary | ICD-10-CM

## 2020-01-05 DIAGNOSIS — I10 Essential (primary) hypertension: Secondary | ICD-10-CM | POA: Diagnosis not present

## 2020-01-05 DIAGNOSIS — E6609 Other obesity due to excess calories: Secondary | ICD-10-CM

## 2020-01-05 DIAGNOSIS — Z6838 Body mass index (BMI) 38.0-38.9, adult: Secondary | ICD-10-CM

## 2020-01-05 DIAGNOSIS — E781 Pure hyperglyceridemia: Secondary | ICD-10-CM

## 2020-01-05 MED ORDER — LISINOPRIL 2.5 MG PO TABS: 2.5000 mg | ORAL_TABLET | Freq: Every day | ORAL | 1 refills | Status: DC

## 2020-01-05 MED ORDER — LEVOTHYROXINE SODIUM 125 MCG PO TABS: ORAL_TABLET | ORAL | 1 refills | Status: DC

## 2020-01-05 MED ORDER — HYDROCHLOROTHIAZIDE 12.5 MG PO CAPS: 12.5000 mg | ORAL_CAPSULE | Freq: Every day | ORAL | 1 refills | Status: DC

## 2020-01-05 MED ORDER — CITALOPRAM HYDROBROMIDE 20 MG PO TABS: 10.0000 mg | ORAL_TABLET | Freq: Every day | ORAL | 1 refills | Status: DC

## 2020-01-05 MED ORDER — TOPIRAMATE 50 MG PO TABS: ORAL_TABLET | ORAL | 2 refills | Status: DC

## 2020-01-05 MED ORDER — ROSUVASTATIN CALCIUM 10 MG PO TABS: 10.0000 mg | ORAL_TABLET | Freq: Every day | ORAL | 1 refills | Status: DC

## 2020-01-05 MED FILL — LEVOTHYROXINE SODIUM 125 MC: 125 | 90 days supply | Qty: 90 | Fill #0

## 2020-01-05 MED FILL — TOPIRAMATE 50 MG TABLET: 50 | 30 days supply | Qty: 30 | Fill #0

## 2020-01-05 MED FILL — HYDROCHLOROTHIAZIDE 12.5 MG: 12.5 | 90 days supply | Qty: 90 | Fill #0

## 2020-01-05 MED FILL — CITALOPRAM HBR 20 MG TABLET: 20 | 90 days supply | Qty: 45 | Fill #0

## 2020-01-05 NOTE — Progress Notes (Signed)
Subjective:     [name] is a 64 y.o. female and is here for a comprehensive physical exam. The patient reports problems - her weight continues to be a problem. she really would like to consider referrall to bariatrics. .  Social History   Socioeconomic History  . Marital status: Married    Spouse name: [name]  . Number of children: 4  . Years of education: 14+  . Highest education level: Not on file  Occupational History    Employer: [employer]  Tobacco Use  . Smoking status: Former Smoker    Quit date: 11/22/1983    Years since quitting: 36.1  . Smokeless tobacco: Never Used  Substance and Sexual Activity  . Alcohol use: Yes    Comment: occasional  . Drug use: No  . Sexual activity: Yes    Partners: Male  Other Topics Concern  . Not on file  Social History Narrative   Marital Status:  Married Marine scientist)   Children: 4   Pets:  Dogs (2)   Living Situation: Lives with spouse, son and his family.     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 Difficulty of Paying Living Expenses:   Food Insecurity:   . Worried About Charity fundraiser in the Last Year:   . Arboriculturist in the Last Year:   Transportation Needs:   . Film/video editor (Medical):   Marland Kitchen Lack of Transportation (Non-Medical):   Physical Activity:   . Days of Exercise per Week:   . Minutes of Exercise per Session:   Stress:   . Feeling of Stress :   Social Connections:   . Frequency of Communication with Friends and Family:   . Frequency of Social Gatherings with Friends and Family:   . Attends Religious Services:   . Active Member of Clubs or Organizations:   . Attends Archivist Meetings:   Marland Kitchen  Marital Status:   Intimate Partner Violence:   . Fear of Current or Ex-Partner:   . Emotionally Abused:   Marland Kitchen Physically Abused:   . Sexually Abused:    Health Maintenance  Topic Date Due  . INFLUENZA VACCINE  03/10/2020  . MAMMOGRAM  05/26/2020  . TETANUS/TDAP  09/27/2021  . COLONOSCOPY  05/13/2023  . COVID-19 Vaccine  Completed  . Hepatitis C Screening  Completed  . HIV Screening  Completed    The following portions of the patient's history were reviewed and updated as appropriate: allergies, current medications, past family history, past medical history, past social history, past surgical history and problem list.  Review of Systems Pertinent items noted in HPI and remainder of comprehensive ROS otherwise negative.   Objective:    BP (!) 130/56   Pulse 63   Ht 5' 4.5" (1.638 m)   Wt 229 lb (103.9 kg)   SpO2 97%   BMI 38.70 kg/m  General appearance: moderately obese Head: Normocephalic, without obvious abnormality, atraumatic Eyes: conjunctivae/corneas clear. PERRL, EOM's intact. Fundi benign. Ears: normal TM's and external ear canals both ears Nose: Nares normal.  Septum midline. Mucosa normal. No drainage or sinus tenderness. Throat: lips, mucosa, and tongue normal; teeth and gums normal Neck: no adenopathy, no carotid bruit, no JVD, supple, symmetrical, trachea midline and thyroid not enlarged, symmetric, no tenderness/mass/nodules Back: symmetric, no curvature. ROM normal. No CVA tenderness. Lungs: clear to auscultation bilaterally Heart: regular rate and rhythm, S1, S2 normal, no murmur, click, rub or gallop Abdomen: soft, non-tender; bowel sounds normal; no masses,  no organomegaly Extremities: extremities normal, atraumatic, no cyanosis or edema Pulses: 2+ and symmetric Skin: Skin color, texture, turgor normal. No rashes or lesions or 2 flesh colored papules of left external nare. slightly raised waxy hyperpigement macule of left temple.  Lymph nodes: Cervical,  supraclavicular, and axillary nodes normal. Neurologic: Alert and oriented X 3, normal strength and tone. Normal symmetric reflexes. Normal coordination and gait    Assessment:    Healthy female exam.      Plan:    Marland KitchenMarland KitchenMonicia was seen today for annual exam.  Diagnoses and all orders for this visit:  Routine physical examination  Anxiety state -     citalopram (CELEXA) 20 MG tablet; Take 0.5 tablets (10 mg total) by mouth daily.  Essential hypertension, benign -     lisinopril (ZESTRIL) 2.5 MG tablet; Take 1 tablet (2.5 mg total) by mouth daily. -     hydrochlorothiazide (MICROZIDE) 12.5 MG capsule; Take 1 capsule (12.5 mg total) by mouth daily.  Mixed hyperlipidemia -     rosuvastatin (CRESTOR) 10 MG tablet; Take 1 tablet (10 mg total) by mouth daily.  Hypertriglyceridemia -     rosuvastatin (CRESTOR) 10 MG tablet; Take 1 tablet (10 mg total) by mouth daily.  Hypothyroidism, unspecified type -     levothyroxine (SYNTHROID) 125 MCG tablet; TAKE 1 TABLET BY MOUTH ONCE DAILY  Papules -     Ambulatory referral to Dermatology  Seborrheic keratoses -     Ambulatory referral to Dermatology  Class 2 obesity due to excess calories without serious comorbidity with body mass index (BMI) of 38.0 to 38.9 in adult -     topiramate (TOPAMAX) 50 MG tablet; Start 1/2 tablet for 7 days at night then increase to 1 full tablet at night daily. -     Amb Referral to Bariatric Surgery  Osteoporosis screening -     DG Bone Density   .Marland Kitchen Depression screen [facility] 2/9 01/05/2020 03/09/2019 11/02/2018 03/08/2018 06/17/2017  Decreased Interest 0 0 0 0 0  Down, Depressed, Hopeless 0 0 0 0 0  PHQ - 2 Score 0 0 0 0 0  Altered sleeping 0 0 0 0 -  Tired, decreased energy 0 0 0 0 -  Change in appetite 0 0 0 0 -  Feeling bad or failure about yourself  0 0 0 0 -  Trouble concentrating 0 0 0 0 -  Moving slowly or fidgety/restless 0 0 0 0 -  Suicidal thoughts 0 0 0 0 -  PHQ-9 Score 0 0 0 0 -  Difficult doing  work/chores Not difficult at all Not difficult at all Not difficult at all Not difficult at all -   .. GAD 7 : Generalized Anxiety Score 01/05/2020 03/09/2019 11/02/2018 03/08/2018  Nervous, Anxious, on Edge 0 0 0 0  Control/stop worrying 0 0 0 0  Worry too much - different things 0 0 0 0  Trouble relaxing 0 0 0 0  Restless 0 0 0 0  Easily annoyed or irritable 0 0 0 0  Afraid -  awful might happen 0 0 0 0  Total GAD 7 Score 0 0 0 0  Anxiety Difficulty Not difficult at all Not difficult at all Not difficult at all Not difficult at all    .Marland Kitchen Discussed 150 minutes of exercise a week.  Encouraged vitamin D 1000 units and Calcium 1300mg  or 4 servings of dairy a day.  Fasting labs UTD.  Mammogram ordered.  Bone density ordered.  Colonoscopy UTD.  Vaccines UTD.   Marland Kitchen.Discussed low carb diet with 1500 calories and 80g of protein.  Exercising at least 150 minutes a week.  My Fitness Pal could be a Microbiologist.  Tried phentermine, Wellbutrin, saxenda.  Started topamax today.  Pt has tried Engineer, water. She has actively been trying to lose weight for years and just comes and goes.  Referral made to bariatrics.   Skin papules present for a while pt would like to get taken off. SK appears benign but referral placed.   Mood extremely controlled. Consider taking every other day for a few weeks then stop.  See After Visit Summary for Counseling Recommendations

## 2020-01-05 NOTE — Patient Instructions (Signed)
Referral to derm/bariatric Start topamax.  Health Maintenance, Female Adopting a healthy lifestyle and getting preventive care are important in promoting health and wellness. Ask your health care provider about:  The right schedule for you to have regular tests and exams.  Things you can do on your own to prevent diseases and keep yourself healthy. What should I know about diet, weight, and exercise? Eat a healthy diet   Eat a diet that includes plenty of vegetables, fruits, low-fat dairy products, and lean protein.  Do not eat a lot of foods that are high in solid fats, added sugars, or sodium. Maintain a healthy weight Body mass index (BMI) is used to identify weight problems. It estimates body fat based on height and weight. Your health care provider can help determine your BMI and help you achieve or maintain a healthy weight. Get regular exercise Get regular exercise. This is one of the most important things you can do for your health. Most adults should:  Exercise for at least 150 minutes each week. The exercise should increase your heart rate and make you sweat (moderate-intensity exercise).  Do strengthening exercises at least twice a week. This is in addition to the moderate-intensity exercise.  Spend less time sitting. Even light physical activity can be beneficial. Watch cholesterol and blood lipids Have your blood tested for lipids and cholesterol at 65 years of age, then have this test every 5 years. Have your cholesterol levels checked more often if:  Your lipid or cholesterol levels are high.  You are older than 65 years of age.  You are at high risk for heart disease. What should I know about cancer screening? Depending on your health history and family history, you may need to have cancer screening at various ages. This may include screening for:  Breast cancer.  Cervical cancer.  Colorectal cancer.  Skin cancer.  Lung cancer. What should I know about  heart disease, diabetes, and high blood pressure? Blood pressure and heart disease  High blood pressure causes heart disease and increases the risk of stroke. This is more likely to develop in people who have high blood pressure readings, are of African descent, or are overweight.  Have your blood pressure checked: ? Every 3-5 years if you are 45-64 years of age. ? Every year if you are 71 years old or older. Diabetes Have regular diabetes screenings. This checks your fasting blood sugar level. Have the screening done:  Once every three years after age 14 if you are at a normal weight and have a low risk for diabetes.  More often and at a younger age if you are overweight or have a high risk for diabetes. What should I know about preventing infection? Hepatitis B If you have a higher risk for hepatitis B, you should be screened for this virus. Talk with your health care provider to find out if you are at risk for hepatitis B infection. Hepatitis C Testing is recommended for:  Everyone born from [birth year] through 1966.  Anyone with known risk factors for hepatitis C. Sexually transmitted infections (STIs)  Get screened for STIs, including gonorrhea and chlamydia, if: ? You are sexually active and are younger than 65 years of age. ? You are older than 65 years of age and your health care provider tells you that you are at risk for this type of infection. ? Your sexual activity has changed since you were last screened, and you are at increased risk for chlamydia or gonorrhea.  Ask your health care provider if you are at risk.  Ask your health care provider about whether you are at high risk for HIV. Your health care provider may recommend a prescription medicine to help prevent HIV infection. If you choose to take medicine to prevent HIV, you should first get tested for HIV. You should then be tested every 3 months for as long as you are taking the medicine. Pregnancy  If you are about to  stop having your period (premenopausal) and you may become pregnant, seek counseling before you get pregnant.  Take 400 to 800 micrograms (mcg) of folic acid every day if you become pregnant.  Ask for birth control (contraception) if you want to prevent pregnancy. Osteoporosis and menopause Osteoporosis is a disease in which the bones lose minerals and strength with aging. This can result in bone fractures. If you are 7 years old or older, or if you are at risk for osteoporosis and fractures, ask your health care provider if you should:  Be screened for bone loss.  Take a calcium or vitamin D supplement to lower your risk of fractures.  Be given hormone replacement therapy (HRT) to treat symptoms of menopause. Follow these instructions at home: Lifestyle  Do not use any products that contain nicotine or tobacco, such as cigarettes, e-cigarettes, and chewing tobacco. If you need help quitting, ask your health care provider.  Do not use street drugs.  Do not share needles.  Ask your health care provider for help if you need support or information about quitting drugs. Alcohol use  Do not drink alcohol if: ? Your health care provider tells you not to drink. ? You are pregnant, may be pregnant, or are planning to become pregnant.  If you drink alcohol: ? Limit how much you use to 0-1 drink a day. ? Limit intake if you are breastfeeding.  Be aware of how much alcohol is in your drink. In the [place] one drink equals one 12 oz bottle of beer (355 mL), one 5 oz glass of wine (148 mL), or one 1 oz glass of hard liquor (44 mL). General instructions  Schedule regular health, dental, and eye exams.  Stay current with your vaccines.  Tell your health care provider if: ? You often feel depressed. ? You have ever been abused or do not feel safe at home. Summary  Adopting a healthy lifestyle and getting preventive care are important in promoting health and wellness.  Follow your  health care provider's instructions about healthy diet, exercising, and getting tested or screened for diseases.  Follow your health care provider's instructions on monitoring your cholesterol and blood pressure. This information is not intended to replace advice given to you by your health care provider. Make sure you discuss any questions you have with your health care provider. Document Revised: 07/20/2018 Document Reviewed: 07/20/2018 Elsevier Patient Education  2020 [employer].

## 2020-01-09 ENCOUNTER — Encounter: Payer: Self-pay | Admitting: Physician Assistant

## 2020-01-22 ENCOUNTER — Other Ambulatory Visit: Payer: Self-pay | Admitting: Physician Assistant

## 2020-01-22 DIAGNOSIS — K2100 Gastro-esophageal reflux disease with esophagitis, without bleeding: Secondary | ICD-10-CM

## 2020-01-22 MED FILL — ESOMEPRAZOLE MAG DR 40 MG C: 40 | 90 days supply | Qty: 90 | Fill #0

## 2020-01-22 MED FILL — LISINOPRIL 2.5 MG TABLET: 2.5 | 90 days supply | Qty: 90 | Fill #0

## 2020-01-25 ENCOUNTER — Other Ambulatory Visit: Payer: Self-pay | Admitting: Physician Assistant

## 2020-01-25 DIAGNOSIS — Z1231 Encounter for screening mammogram for malignant neoplasm of breast: Secondary | ICD-10-CM

## 2020-02-06 ENCOUNTER — Other Ambulatory Visit: Payer: Self-pay

## 2020-02-06 ENCOUNTER — Ambulatory Visit
Admission: RE | Admit: 2020-02-06 | Discharge: 2020-02-06 | Disposition: A | Discharge status: Home / Self Care | Payer: 59 | Source: Ambulatory Visit

## 2020-02-06 DIAGNOSIS — Z1231 Encounter for screening mammogram for malignant neoplasm of breast: Secondary | ICD-10-CM | POA: Diagnosis not present

## 2020-02-09 DIAGNOSIS — G4733 Obstructive sleep apnea (adult) (pediatric): Secondary | ICD-10-CM | POA: Diagnosis not present

## 2020-02-09 NOTE — Progress Notes (Signed)
Normal mammogram. Follow up in 1 year.

## 2020-02-12 MED FILL — ROSUVASTATIN CALCIUM 10 MG: 10 | 90 days supply | Qty: 90 | Fill #1

## 2020-02-14 ENCOUNTER — Other Ambulatory Visit: Payer: Self-pay

## 2020-02-14 ENCOUNTER — Ambulatory Visit (INDEPENDENT_AMBULATORY_CARE_PROVIDER_SITE_OTHER): Payer: 59 | Admitting: Physician Assistant

## 2020-02-14 VITALS — Ht 64.5 in | Wt 229.0 lb

## 2020-02-14 DIAGNOSIS — E782 Mixed hyperlipidemia: Secondary | ICD-10-CM

## 2020-02-14 DIAGNOSIS — H8112 Benign paroxysmal vertigo, left ear: Secondary | ICD-10-CM | POA: Diagnosis not present

## 2020-02-14 DIAGNOSIS — R7303 Prediabetes: Secondary | ICD-10-CM | POA: Diagnosis not present

## 2020-02-14 DIAGNOSIS — E611 Iron deficiency: Secondary | ICD-10-CM

## 2020-02-14 DIAGNOSIS — E039 Hypothyroidism, unspecified: Secondary | ICD-10-CM

## 2020-02-14 DIAGNOSIS — N1831 Chronic kidney disease, stage 3a: Secondary | ICD-10-CM

## 2020-02-14 DIAGNOSIS — I10 Essential (primary) hypertension: Secondary | ICD-10-CM | POA: Diagnosis not present

## 2020-02-14 DIAGNOSIS — R29818 Other symptoms and signs involving the nervous system: Secondary | ICD-10-CM | POA: Diagnosis not present

## 2020-02-14 DIAGNOSIS — G43109 Migraine with aura, not intractable, without status migrainosus: Secondary | ICD-10-CM | POA: Diagnosis not present

## 2020-02-14 NOTE — Progress Notes (Signed)
Subjective:    Patient ID: [name], female    DOB: 12-29-1955, 64 y.o.   MRN: [id number]  HPI patient is a 64 year old female with history of BPPV and needed vestibular rehab for resolution who presents to the clinic with me onset dizziness and vertigo symptoms.  Symptoms started a few days before July 4.  She has no known trigger.  She will fell herself getting dizzy even in bed when she just turns her head.  It does seem worse when she turns to the left. Symptoms hit her in waves. Her last major episode of dizziness was 2019 until now.  Patient denies any sinus pressure, ear pain, cough.  She denies any recent travel.  She is concerned because she has started to have an aura then the start of a headache then dizziness.  She denies any upper extremity weakness or numbness and tingling.  The headache seems to be occipital and bilateral.  Orals are flashing lights in both eyes.  She is taking Tylenol which seems to help some.  Nothing is helping her dizziness.  She does have some Antivert which she is using.  She admits to not starting the Epley maneuvers.   .. Active Ambulatory Problems    Diagnosis Date Noted  . UTI (urinary tract infection) 07/14/2012  . Other and unspecified hyperlipidemia 07/14/2012  . MVP (mitral valve prolapse) 07/14/2012  . GERD (gastroesophageal reflux disease) 07/14/2012  . Allergic rhinitis 07/14/2012  . Mitral regurgitation 07/14/2012  . S/P hysterectomy 07/14/2012  . Essential hypertension, benign 04/29/2013  . Wheezing 04/29/2013  . Insomnia 04/29/2013  . Unspecified vitamin D deficiency 04/29/2013  . Migraine headache 04/29/2013  . Anxiety state 04/29/2013  . Unspecified hypothyroidism 04/29/2013  . Obesity (BMI 30-39.9) 07/17/2013  . Cough 08/15/2013  . Arthritis of left knee 09/14/2013  . Status post total knee replacement 09/14/2013  . Abdominal pain, chronic, right upper quadrant 11/16/2013  . Candidiasis of female genitalia 11/16/2013  .  Elevated liver enzymes 11/16/2013  . Folliculitis 78/29/5621  . Nausea alone 11/18/2013  . Abnormal transaminases 11/18/2013  . Hyperlipidemia 11/22/2014  . Single kidney 11/22/2014  . Hypothyroidism 11/22/2014  . OSA (obstructive sleep apnea) 12/23/2015  . Daytime sleepiness 01/24/2017  . Carpal tunnel syndrome, left upper limb 02/25/2017  . Status post carpal tunnel release 03/11/2017  . Hypertriglyceridemia 07/14/2017  . Elevated fasting glucose 07/14/2017  . Chronic pain of right knee 09/13/2017  . Unilateral primary osteoarthritis, right knee 09/27/2017  . Pre-diabetes 10/19/2017  . Pain in thumb joint with movement of right hand 10/19/2017  . Class 2 obesity due to excess calories without serious comorbidity with body mass index (BMI) of 35.0 to 35.9 in adult 10/19/2017  . Other meniscus derangements, posterior horn of medial meniscus, right knee 11/24/2017  . Pain of right thumb 11/24/2017  . Trigger thumb, right thumb 11/24/2017  . BPPV (benign paroxysmal positional vertigo), right 02/20/2018  . Concussion wth loss of consciousness of 30 minutes or less 02/20/2018  . Fall 02/20/2018  . CKD (chronic kidney disease) stage 3, GFR 30-59 ml/min 03/10/2019  . BPPV (benign paroxysmal positional vertigo), left 05/22/2019  . Pain radiating to back 05/23/2019  . Elevated lipase 06/12/2019  . Seborrheic keratoses 01/05/2020  . Papules 01/05/2020  . Class 2 obesity due to excess calories without serious comorbidity with body mass index (BMI) of 38.0 to 38.9 in adult 01/05/2020   Resolved Ambulatory Problems    Diagnosis Date Noted  . Hypothyroidism  07/14/2012   Past Medical History:  Diagnosis Date  . Acute medial meniscal tear   . Allergy   . Anxiety   . Aortic insufficiency   . Aortic regurgitation   . Arthritis   . Carpal tunnel syndrome   . Chronic kidney disease   . Dysrhythmia   . Mitral valve prolapse   . Need for SBE (subacute bacterial endocarditis) prophylaxis    . Pilonidal cyst without mention of abscess   . Pneumonia Aug 14 2012  . PONV (postoperative nausea and vomiting)   . Sleep apnea   . Thyroid disease   . Transplanted kidney removed         Review of Systems See HPI.     Objective:   Physical Exam Vitals reviewed.  Constitutional:      Appearance: Normal appearance. She is obese.  HENT:     Head: Normocephalic.     Right Ear: Tympanic membrane normal.     Left Ear: Tympanic membrane normal.     Nose: Nose normal.     Mouth/Throat:     Mouth: Mucous membranes are moist.  Eyes:     Pupils: Pupils are equal, round, and reactive to light.     Comments: Dizziness with EOM.  Cardiovascular:     Rate and Rhythm: Normal rate and regular rhythm.     Pulses: Normal pulses.  Pulmonary:     Effort: Pulmonary effort is normal.     Breath sounds: Normal breath sounds.  Neurological:     General: No focal deficit present.     Mental Status: She is alert and oriented to person, place, and time.     Cranial Nerves: No cranial nerve deficit.     Motor: No weakness.     Coordination: Coordination normal.     Gait: Gait normal.     Deep Tendon Reflexes: Reflexes normal.     Comments: Positive Dix Hallpike to the left.   Psychiatric:        Mood and Affect: Mood normal.           Assessment & Plan:  Marland KitchenMarland KitchenMaddyson was seen today for dizziness.  Diagnoses and all orders for this visit:  BPPV (benign paroxysmal positional vertigo), left -     COMPLETE METABOLIC PANEL WITH GFR -     Ambulatory referral to Physical Therapy  Essential hypertension, benign -     COMPLETE METABOLIC PANEL WITH GFR  Migraine with aura and without status migrainosus, not intractable -     COMPLETE METABOLIC PANEL WITH GFR -     CBC  Pre-diabetes -     COMPLETE METABOLIC PANEL WITH GFR -     CBC -     Hemoglobin A1c  Stage 3a chronic kidney disease -     COMPLETE METABOLIC PANEL WITH GFR  Hypothyroidism, unspecified type -     COMPLETE  METABOLIC PANEL WITH GFR -     TSH -     CBC  Mixed hyperlipidemia -     Lipid Panel w/reflex Direct LDL  orthostatic BP normal.   Classic positive dix-hallpike to the left however she does have some new neurological symptoms with migraine with new aura. I would like to get MRI of brain.   Written out of work until first session at PT for vestibular rehab. She will let us know when that is scheduled. Continue to do epley manuevers at home and use antivert as needed.   Needs fasting labs.  Follow up as needed. Use tylenol or NSAIDs for migraine/aura.

## 2020-02-14 NOTE — Patient Instructions (Signed)
Benign Positional Vertigo Vertigo is the feeling that you or your surroundings are moving when they are not. Benign positional vertigo is the most common form of vertigo. This is usually a harmless condition (benign). This condition is positional. This means that symptoms are triggered by certain movements and positions. This condition can be dangerous if it occurs while you are doing something that could cause harm to you or others. This includes activities such as driving or operating machinery. What are the causes? In many cases, the cause of this condition is not known. It may be caused by a disturbance in an area of the inner ear that helps your brain to sense movement and balance. This disturbance can be caused by:  Viral infection (labyrinthitis).  Head injury.  Repetitive motion, such as jumping, dancing, or running. What increases the risk? You are more likely to develop this condition if:  You are a woman.  You are 50 years of age or older. What are the signs or symptoms? Symptoms of this condition usually happen when you move your head or your eyes in different directions. Symptoms may start suddenly, and usually last for less than a minute. They include:  Loss of balance and falling.  Feeling like you are spinning or moving.  Feeling like your surroundings are spinning or moving.  Nausea and vomiting.  Blurred vision.  Dizziness.  Involuntary eye movement (nystagmus). Symptoms can be mild and cause only minor problems, or they can be severe and interfere with daily life. Episodes of benign positional vertigo may return (recur) over time. Symptoms may improve over time. How is this diagnosed? This condition may be diagnosed based on:  Your medical history.  Physical exam of the head, neck, and ears.  Tests, such as: ? MRI. ? CT scan. ? Eye movement tests. Your health care provider may ask you to change positions quickly while he or she watches you for symptoms  of benign positional vertigo, such as nystagmus. Eye movement may be tested with a variety of exams that are designed to evaluate or stimulate vertigo. ? An electroencephalogram (EEG). This records electrical activity in your brain. ? Hearing tests. You may be referred to a health care provider who specializes in ear, nose, and throat (ENT) problems (otolaryngologist) or a provider who specializes in disorders of the nervous system (neurologist). How is this treated?  This condition may be treated in a session in which your health care provider moves your head in specific positions to adjust your inner ear back to normal. Treatment for this condition may take several sessions. Surgery may be needed in severe cases, but this is rare. In some cases, benign positional vertigo may resolve on its own in 2-4 weeks. Follow these instructions at home: Safety  Move slowly. Avoid sudden body or head movements or certain positions, as told by your health care provider.  Avoid driving until your health care provider says it is safe for you to do so.  Avoid operating heavy machinery until your health care provider says it is safe for you to do so.  Avoid doing any tasks that would be dangerous to you or others if vertigo occurs.  If you have trouble walking or keeping your balance, try using a cane for stability. If you feel dizzy or unstable, sit down right away.  Return to your normal activities as told by your health care provider. Ask your health care provider what activities are safe for you. General instructions  Take over-the-counter   and prescription medicines only as told by your health care provider.  Drink enough fluid to keep your urine pale yellow.  Keep all follow-up visits as told by your health care provider. This is important. Contact a health care provider if:  You have a fever.  Your condition gets worse or you develop new symptoms.  Your family or friends notice any  behavioral changes.  You have nausea or vomiting that gets worse.  You have numbness or a "pins and needles" sensation. Get help right away if you:  Have difficulty speaking or moving.  Are always dizzy.  Faint.  Develop severe headaches.  Have weakness in your legs or arms.  Have changes in your hearing or vision.  Develop a stiff neck.  Develop sensitivity to light. Summary  Vertigo is the feeling that you or your surroundings are moving when they are not. Benign positional vertigo is the most common form of vertigo.  The cause of this condition is not known. It may be caused by a disturbance in an area of the inner ear that helps your brain to sense movement and balance.  Symptoms include loss of balance and falling, feeling that you or your surroundings are moving, nausea and vomiting, and blurred vision.  This condition can be diagnosed based on symptoms, physical exam, and other tests, such as MRI, CT scan, eye movement tests, and hearing tests.  Follow safety instructions as told by your health care provider. You will also be told when to contact your health care provider in case of problems. This information is not intended to replace advice given to you by your health care provider. Make sure you discuss any questions you have with your health care provider. Document Revised: 01/05/2018 Document Reviewed: 01/05/2018 Elsevier Patient Education  2020 Elsevier Inc.  

## 2020-02-16 ENCOUNTER — Encounter: Payer: Self-pay | Admitting: Physician Assistant

## 2020-02-19 ENCOUNTER — Encounter: Payer: Self-pay | Admitting: Physician Assistant

## 2020-02-19 NOTE — Telephone Encounter (Signed)
FYI - pt will be sending in [facility] paperwork for completion.

## 2020-02-21 ENCOUNTER — Other Ambulatory Visit: Payer: Self-pay

## 2020-02-21 ENCOUNTER — Encounter: Payer: Self-pay | Admitting: Physical Therapy

## 2020-02-21 ENCOUNTER — Ambulatory Visit: Payer: 59 | Attending: Physician Assistant | Admitting: Physical Therapy

## 2020-02-21 DIAGNOSIS — R42 Dizziness and giddiness: Secondary | ICD-10-CM | POA: Diagnosis not present

## 2020-02-21 DIAGNOSIS — H8112 Benign paroxysmal vertigo, left ear: Secondary | ICD-10-CM | POA: Diagnosis not present

## 2020-02-21 NOTE — Therapy (Addendum)
[address] [place], [place], [address] Phone: [phone]   Fax:  [phone]  Physical Therapy Evaluation  Patient Details  Name: [name] MRN: [id number] Date of Birth: Jul 22, 1956 Referring Provider (PT): Iran Planas   Encounter Date: 02/21/2020   PT End of Session - 02/21/20 1404    Visit Number 1    Date for PT Re-Evaluation 04/23/20    PT Start Time 1320    PT Stop Time 1405    PT Time Calculation (min) 45 min    Activity Tolerance Patient tolerated treatment well    Behavior During Therapy [facility] for tasks assessed/performed           Past Medical History:  Diagnosis Date  . Acute medial meniscal tear    Right knee  . Allergy   . Anxiety   . Aortic insufficiency    Mild  . Aortic regurgitation   . Arthritis   . Carpal tunnel syndrome   . Chronic kidney disease    one kidney   . Dysrhythmia    pvc's at times  . GERD (gastroesophageal reflux disease)   . Hyperlipidemia   . Hypothyroidism   . Migraine headache   . Mitral regurgitation   . Mitral valve prolapse   . Nausea alone   . Need for SBE (subacute bacterial endocarditis) prophylaxis   . Pilonidal cyst without mention of abscess   . Pneumonia Aug 14 2012  . PONV (postoperative nausea and vomiting)    states she vasovagals  . Sleep apnea    wears CPAP  . Thyroid disease   . Transplanted kidney removed    donated kidney    Past Surgical History:  Procedure Laterality Date  . ABDOMINAL HYSTERECTOMY    . CARPAL TUNNEL RELEASE Right   . CARPAL TUNNEL RELEASE Left 02/25/2017   Procedure: LEFT OPEN CARPAL TUNNEL RELEASE;  Surgeon: [name], MD;  Location: [place];  Service: Orthopedics;  Laterality: Left;  . COLONOSCOPY    . KNEE ARTHROSCOPY Right 01/06/2018   Procedure: RIGHT KNEE ARTHROSCOPY WITH PARTIAL  MEDIAL MENISCECTOMY;  Surgeon: [name], MD;  Location: [place];  Service: Orthopedics;   Laterality: Right;  . KNEE SURGERY    . NEPHRECTOMY    . PARTIAL KNEE ARTHROPLASTY Right 03/31/2018   Procedure: RIGHT KNEE UNI ARTHROPLASTY;  Surgeon: Marchia Bond, MD;  Location: [place];  Service: Orthopedics;  Laterality: Right;  . TONSILLECTOMY    . TOTAL KNEE ARTHROPLASTY Left 09/14/2013   Procedure: LEFT TOTAL KNEE ARTHROPLASTY;  Surgeon: [name], MD;  Location: [place];  Service: Orthopedics;  Laterality: Left;  . TUBAL LIGATION    . WISDOM TOOTH EXTRACTION      There were no vitals filed for this visit.    Subjective Assessment - 02/21/20 1328    Subjective Patient reports about 2 weeks ago she was in bed lying down and the room started spinning, unsure of any movement.  She was taught Epley but has continued.    Currently in Pain? No/denies              [facility] PT Assessment - 02/21/20 0001      Assessment   Medical Diagnosis BPPV    Referring Provider (PT) Iran Planas    Onset Date/Surgical Date 02/07/20    Prior Therapy for BPPV 2 years      Precautions   Precautions None  Balance Screen   Has the patient fallen in the past 6 months No    Has the patient had a decrease in activity level because of a fear of falling?  No    Is the patient reluctant to leave their home because of a fear of falling?  No      Home Environment   Additional Comments stairs, does yardwork and housework      Prior Function   Level of Independence Independent    Vocation Full time employment    Teacher, adult education of nursing    Leisure no exercisea                  Vestibular Assessment - 02/21/20 0001      Symptom Behavior   Subjective history of current problem occurred while lying in bed it occurred    Type of Dizziness  Spinning;"World moves"    Frequency of Dizziness multiple times a day    Duration of Dizziness 5 minutes    Symptom Nature Spontaneous    Aggravating Factors Turning head quickly;Rolling  to left    Relieving Factors Dark room;Closing eyes    Progression of Symptoms No change since onset      Oculomotor Exam   Oculomotor Alignment Normal    Spontaneous Absent    Smooth Pursuits Intact   "makes me feel woozy   Saccades Intact   feel woozy     Vestibulo-Ocular Reflex   Comment all VOR activities seem to make her feel "woozy" but not he room spinning      Positional Testing   Dix-Hallpike Dix-Hallpike Left      Dix-Hallpike Left   Dix-Hallpike Left Duration 30 seconds    Dix-Hallpike Left Symptoms Upbeat Nystagmus              Objective measurements completed on examination: See above findings.        Vestibular Treatment/Exercise - 02/21/20 0001      Vestibular Treatment/Exercise   Vestibular Treatment Provided Canalith Repositioning    Canalith Repositioning Epley Manuever Left       EPLEY MANUEVER LEFT   Number of Reps  1    Overall Response  Improved Symptoms                 PT Education - 02/21/20 1402    Education Details gave HEP for VOR, Brandt-Daroff, and post epley care instructions    Person(s) Educated Patient    Methods Explanation;Demonstration;Handout    Comprehension Verbalized understanding               PT Long Term Goals - 02/21/20 1532      PT LONG TERM GOAL #1   Title independent with HEP    Time 8    Period Weeks    Status New      PT LONG TERM GOAL #2   Title decrease symptoms 75%    Time 8    Period Weeks    Status New                  Plan - 02/21/20 1408    Clinical Impression Statement Patient reports that about 2 weeks ago she was lying in bed and the room started spinning, she reports that she has this happen a few times a day, she reports that it can last up to 5 minutes, she reports that she is unsure of what causes this, reports eases with dark room  and closing her eyes. She had a positive left [facility], we completed the left Epley with decreased symptoms, has some VOR issues as  well.    Stability/Clinical Decision Making Evolving/Moderate complexity    Clinical Decision Making Low    Rehab Potential Good    PT Frequency 1x / week    PT Duration 8 weeks    PT Treatment/Interventions ADLs/Self Care Home Management;Neuromuscular re-education;Balance training;Patient/family education;Vestibular;Visual/perceptual remediation/compensation;Canalith Repostioning    PT Next Visit Plan gave HEP for Eply, VOR and Brandt-Daroff    Consulted and Agree with Plan of Care Patient           Patient will benefit from skilled therapeutic intervention in order to improve the following deficits and impairments:  Dizziness, Decreased activity tolerance, Decreased balance  Visit Diagnosis: BPPV (benign paroxysmal positional vertigo), left - Plan: PT plan of care cert/re-cert  Dizziness and giddiness - Plan: PT plan of care cert/re-cert     Problem List Patient Active Problem List   Diagnosis Date Noted  . Seborrheic keratoses 01/05/2020  . Papules 01/05/2020  . Class 2 obesity due to excess calories without serious comorbidity with body mass index (BMI) of 38.0 to 38.9 in adult 01/05/2020  . Elevated lipase 06/12/2019  . Pain radiating to back 05/23/2019  . BPPV (benign paroxysmal positional vertigo), left 05/22/2019  . CKD (chronic kidney disease) stage 3, GFR 30-59 ml/min 03/10/2019  . BPPV (benign paroxysmal positional vertigo), right 02/20/2018  . Concussion wth loss of consciousness of 30 minutes or less 02/20/2018  . Fall 02/20/2018  . Other meniscus derangements, posterior horn of medial meniscus, right knee 11/24/2017  . Pain of right thumb 11/24/2017  . Trigger thumb, right thumb 11/24/2017  . Pre-diabetes 10/19/2017  . Pain in thumb joint with movement of right hand 10/19/2017  . Class 2 obesity due to excess calories without serious comorbidity with body mass index (BMI) of 35.0 to 35.9 in adult 10/19/2017  . Unilateral primary osteoarthritis, right knee  09/27/2017  . Chronic pain of right knee 09/13/2017  . Hypertriglyceridemia 07/14/2017  . Elevated fasting glucose 07/14/2017  . Status post carpal tunnel release 03/11/2017  . Carpal tunnel syndrome, left upper limb 02/25/2017  . Daytime sleepiness 01/24/2017  . OSA (obstructive sleep apnea) 12/23/2015  . Hyperlipidemia 11/22/2014  . Single kidney 11/22/2014  . Hypothyroidism 11/22/2014  . Nausea alone 11/18/2013  . Abnormal transaminases 11/18/2013  . Abdominal pain, chronic, right upper quadrant 11/16/2013  . Candidiasis of female genitalia 11/16/2013  . Elevated liver enzymes 11/16/2013  . Folliculitis 95/62/1308  . Arthritis of left knee 09/14/2013  . Status post total knee replacement 09/14/2013  . Cough 08/15/2013  . Obesity (BMI 30-39.9) 07/17/2013  . Essential hypertension, benign 04/29/2013  . Wheezing 04/29/2013  . Insomnia 04/29/2013  . Unspecified vitamin D deficiency 04/29/2013  . Migraine headache 04/29/2013  . Anxiety state 04/29/2013  . Unspecified hypothyroidism 04/29/2013  . UTI (urinary tract infection) 07/14/2012  . Other and unspecified hyperlipidemia 07/14/2012  . MVP (mitral valve prolapse) 07/14/2012  . GERD (gastroesophageal reflux disease) 07/14/2012  . Allergic rhinitis 07/14/2012  . Mitral regurgitation 07/14/2012  . S/P hysterectomy 07/14/2012  03/05/20   Patient called and reported that she has been doing well has not had any further episodes of Vertigo, she reports to me that she was taken out of work by [employer], [employer].  That person is out on vacation all week and patient would like to return to work.  [employer]  reports that she needs a note to say she can return.  I typically do not do these since I do not hold people out of work, but it is of my professional opinion that if she is no longer having any vertigo episodes that she should be capable of returning to work and performing her duties.  Lum Babe, PT   03/05/2020  Sumner Boast., PT 02/21/2020, 3:34 PM  [address] [place], [place], [address] Phone: [phone]   Fax:  [phone]  Name: SHAWNNA PANCAKE MRN: [id number] Date of Birth: 05-Feb-1956

## 2020-02-24 ENCOUNTER — Ambulatory Visit (INDEPENDENT_AMBULATORY_CARE_PROVIDER_SITE_OTHER): Payer: 59

## 2020-02-24 ENCOUNTER — Other Ambulatory Visit: Payer: Self-pay

## 2020-02-24 DIAGNOSIS — R29818 Other symptoms and signs involving the nervous system: Secondary | ICD-10-CM

## 2020-02-24 DIAGNOSIS — D1802 Hemangioma of intracranial structures: Secondary | ICD-10-CM | POA: Diagnosis not present

## 2020-02-26 NOTE — Progress Notes (Signed)
[name],   No acute findings. No cause of headache or dizziness found. You do have a venous angioma which is likely incidental. These are benign.   How is PT going?

## 2020-03-04 ENCOUNTER — Encounter: Payer: Self-pay | Admitting: Physician Assistant

## 2020-03-19 ENCOUNTER — Ambulatory Visit
Admission: RE | Admit: 2020-03-19 | Discharge: 2020-03-19 | Disposition: A | Discharge status: Home / Self Care | Payer: 59 | Source: Ambulatory Visit | Attending: Physician Assistant | Admitting: Physician Assistant

## 2020-03-19 ENCOUNTER — Other Ambulatory Visit: Payer: Self-pay

## 2020-03-19 DIAGNOSIS — Z780 Asymptomatic menopausal state: Secondary | ICD-10-CM | POA: Diagnosis not present

## 2020-03-19 DIAGNOSIS — Z13820 Encounter for screening for osteoporosis: Secondary | ICD-10-CM | POA: Diagnosis not present

## 2020-03-20 NOTE — Progress Notes (Signed)
Ruthella,   Normal bone density. Continue to take daily vitamin D and calcium to prevent any future bone loss.

## 2020-03-21 ENCOUNTER — Encounter: Payer: Self-pay | Admitting: Physician Assistant

## 2020-03-21 ENCOUNTER — Other Ambulatory Visit: Payer: Self-pay

## 2020-03-21 ENCOUNTER — Ambulatory Visit (INDEPENDENT_AMBULATORY_CARE_PROVIDER_SITE_OTHER): Payer: 59 | Admitting: Physician Assistant

## 2020-03-21 DIAGNOSIS — D485 Neoplasm of uncertain behavior of skin: Secondary | ICD-10-CM

## 2020-03-21 DIAGNOSIS — Z1283 Encounter for screening for malignant neoplasm of skin: Secondary | ICD-10-CM | POA: Diagnosis not present

## 2020-03-21 DIAGNOSIS — L570 Actinic keratosis: Secondary | ICD-10-CM

## 2020-03-21 DIAGNOSIS — L821 Other seborrheic keratosis: Secondary | ICD-10-CM

## 2020-03-21 DIAGNOSIS — L578 Other skin changes due to chronic exposure to nonionizing radiation: Secondary | ICD-10-CM

## 2020-03-21 DIAGNOSIS — D1800 Hemangioma unspecified site: Secondary | ICD-10-CM

## 2020-03-21 DIAGNOSIS — D2239 Melanocytic nevi of other parts of face: Secondary | ICD-10-CM | POA: Diagnosis not present

## 2020-03-21 DIAGNOSIS — L814 Other melanin hyperpigmentation: Secondary | ICD-10-CM | POA: Diagnosis not present

## 2020-03-21 DIAGNOSIS — D229 Melanocytic nevi, unspecified: Secondary | ICD-10-CM | POA: Diagnosis not present

## 2020-03-21 NOTE — Progress Notes (Addendum)
New Patient   Subjective  [name] is a 64 y.o. female who presents for the following: Annual Exam (right arm tan spot x months, left temple tan spot neither has bleed, sgh on nose she dont like ).  The following portions of the chart were reviewed this encounter and updated as appropriate: Tobacco  Allergies  Meds  Problems  Med Hx  Surg Hx  Fam Hx      Objective  Well appearing patient in no apparent distress; mood and affect are within normal limits.  A focused examination was performed including waist up and legs. Relevant physical exam findings are noted in the Assessment and Plan.  Objective  Right Forearm - Posterior: Erythematous patches with gritty scale.  Objective  Left Nare posterior: Pink papule     Objective  Left nare anterior: Pink pearly papule  Objective  waist up and legs: No atypical nevi No signs of non-mole skin cancer.   Assessment & Plan  Actinic keratosis Right Forearm - Posterior  Destruction of lesion - Right Forearm - Posterior Complexity: simple   Destruction method: cryotherapy   Informed consent: discussed and consent obtained   Timeout:  patient name, date of birth, surgical site, and procedure verified Lesion destroyed using liquid nitrogen: Yes   Cryotherapy cycles:  1 Outcome: patient tolerated procedure well with no complications   Post-procedure details: wound care instructions given    Neoplasm of uncertain behavior of skin (2) Left Nare posterior  Epidermal / dermal shaving  Lesion diameter (cm):  1 Informed consent: discussed and consent obtained   Timeout: patient name, date of birth, surgical site, and procedure verified   Procedure prep:  Patient was prepped and draped in usual sterile fashion Prep type:  Chlorhexidine Anesthesia: the lesion was anesthetized in a standard fashion   Anesthetic:  1% lidocaine w/ epinephrine 1-100,000 local infiltration Instrument used: DermaBlade   Hemostasis  achieved with: aluminum chloride   Outcome: patient tolerated procedure well   Post-procedure details: sterile dressing applied and wound care instructions given   Dressing type: petrolatum gauze, petrolatum and bandage    Specimen 1 - Surgical pathology Differential Diagnosis: FP vs [facility] Check Margins: No  Left nare anterior  Epidermal / dermal shaving  Lesion diameter (cm):  1 Informed consent: discussed and consent obtained   Timeout: patient name, date of birth, surgical site, and procedure verified   Procedure prep:  Patient was prepped and draped in usual sterile fashion Prep type:  Chlorhexidine Anesthesia: the lesion was anesthetized in a standard fashion   Anesthetic:  1% lidocaine w/ epinephrine 1-100,000 local infiltration Instrument used: DermaBlade   Hemostasis achieved with: aluminum chloride   Outcome: patient tolerated procedure well   Post-procedure details: sterile dressing applied and wound care instructions given   Dressing type: petrolatum gauze, petrolatum and bandage    Specimen 2 - Surgical pathology Differential Diagnosis:  FP vs [facility] Check Margins: No  Screening exam for skin cancer waist up and legs  Skin examinations yearly  Lentigines - Scattered tan macules - Discussed due to sun exposure - Benign, observe - Call for any changes  Seborrheic Keratoses - Stuck-on, waxy, tan-brown papules and plaques  - Discussed benign etiology and prognosis. - Observe - Call for any changes  Melanocytic Nevi - Tan-brown and/or pink-flesh-colored symmetric macules and papules - Benign appearing on exam today - Observation - Call clinic for new or changing moles - Recommend daily use of broad spectrum spf 30+ sunscreen  to sun-exposed areas.   Hemangiomas - Red papules - Discussed benign nature - Observe - Call for any changes  Actinic Damage - diffuse scaly erythematous macules with underlying dyspigmentation - Recommend daily broad spectrum  sunscreen SPF 30+ to sun-exposed areas, reapply every 2 hours as needed.  - Call for new or changing lesions.  Skin cancer screening performed today.

## 2020-03-21 NOTE — Patient Instructions (Signed)
Biopsy, Surgery (Curettage) & Surgery (Excision) Aftercare Instructions  1. Okay to remove bandage in 24 hours  2. Wash area with soap and water  3. Apply Vaseline to area twice daily until healed (Not Neosporin)  4. Okay to cover with a Band-Aid to decrease the chance of infection or prevent irritation from clothing; also it's okay to uncover lesion at home.  5. Suture instructions: return to our office in 7-10 or 10-14 days for a nurse visit for suture removal. Variable healing with sutures, if pain or itching occurs call our office. It's okay to shower or bathe 24 hours after sutures are given.  6. The following risks may occur after a biopsy, curettage or excision: bleeding, scarring, discoloration, recurrence, infection (redness, yellow drainage, pain or swelling).  7. If your results are positive we will contact you, if you do not hear from [place] review your MyChart.  Stay Well

## 2020-04-02 MED FILL — HYDROCHLOROTHIAZIDE 12.5 MG: 12.5 | 90 days supply | Qty: 90 | Fill #1

## 2020-04-04 ENCOUNTER — Other Ambulatory Visit: Payer: Self-pay

## 2020-04-04 ENCOUNTER — Encounter: Payer: Self-pay | Admitting: Cardiology

## 2020-04-04 ENCOUNTER — Ambulatory Visit (INDEPENDENT_AMBULATORY_CARE_PROVIDER_SITE_OTHER): Payer: 59 | Admitting: Cardiology

## 2020-04-04 VITALS — BP 120/60 | HR 79 | Ht 64.5 in | Wt 226.8 lb

## 2020-04-04 DIAGNOSIS — I351 Nonrheumatic aortic (valve) insufficiency: Secondary | ICD-10-CM | POA: Diagnosis not present

## 2020-04-04 DIAGNOSIS — I10 Essential (primary) hypertension: Secondary | ICD-10-CM | POA: Diagnosis not present

## 2020-04-04 NOTE — Patient Instructions (Signed)
Medication Instructions:  The current medical regimen is effective;  continue present plan and medications.  *If you need a refill on your cardiac medications before your next appointment, please call your pharmacy*  Testing/Procedures: Your physician has requested that you have an echocardiogram. Echocardiography is a painless test that uses sound waves to create images of your heart. It provides your doctor with information about the size and shape of your heart and how well your heart's chambers and valves are working. This procedure takes approximately one hour. There are no restrictions for this procedure.  Follow-Up: At [facility], you and your health needs are our priority.  As part of our continuing mission to provide you with exceptional heart care, we have created designated Provider Care Teams.  These Care Teams include your primary Cardiologist (physician) and Advanced Practice Providers (APPs -  Physician Assistants and Nurse Practitioners) who all work together to provide you with the care you need, when you need it.  We recommend signing up for the patient portal called "MyChart".  Sign up information is provided on this After Visit Summary.  MyChart is used to connect with patients for Virtual Visits (Telemedicine).  Patients are able to view lab/test results, encounter notes, upcoming appointments, etc.  Non-urgent messages can be sent to your provider as well.   To learn more about what you can do with MyChart, go to [email or web address].    Your next appointment:   2 year(s)  The format for your next appointment:   In Person  Provider:   Mark Skains, MD   Thank you for choosing  [facility]!!     

## 2020-04-04 NOTE — Progress Notes (Signed)
Cardiology Office Note:    Date:  04/04/2020   ID:  [name], DOB Jul 27, 1956, MRN [id number]  PCP:  Lavada Mesi  [facility] Cardiologist:  Candee Furbish, MD  [facility] Electrophysiologist:  None   Referring MD: Donella Stade, PA-C     History of Present Illness:    [name] is a 64 y.o. female here for follow-up of mitral regurgitation.  She is had murmur for several years.  Had PVCs on monitor previously which correlated with palpitations.  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  Have been on Crestor for hyperlipidemia.  Has had vertigo at times suffered a fall with fracture at 1 point.  Past Medical History:  Diagnosis Date  . Acute medial meniscal tear    Right knee  . Allergy   . Anxiety   . Aortic insufficiency    Mild  . Aortic regurgitation   . Arthritis   . Carpal tunnel syndrome   . Chronic kidney disease    one kidney   . Dysrhythmia    pvc's at times  . GERD (gastroesophageal reflux disease)   . Hyperlipidemia   . Hypothyroidism   . Migraine headache   . Mitral regurgitation   . Mitral valve prolapse   . Nausea alone   . Need for SBE (subacute bacterial endocarditis) prophylaxis   . Pilonidal cyst without mention of abscess   . Pneumonia Aug 14 2012  . PONV (postoperative nausea and vomiting)    states she vasovagals  . Sleep apnea    wears CPAP  . Thyroid disease   . Transplanted kidney removed    donated kidney    Past Surgical History:  Procedure Laterality Date  . ABDOMINAL HYSTERECTOMY    . CARPAL TUNNEL RELEASE Right   . CARPAL TUNNEL RELEASE Left 02/25/2017   Procedure: LEFT OPEN CARPAL TUNNEL RELEASE;  Surgeon: [name], MD;  Location: [place];  Service: Orthopedics;  Laterality: Left;  . COLONOSCOPY    . KNEE ARTHROSCOPY Right 01/06/2018   Procedure: RIGHT KNEE ARTHROSCOPY WITH PARTIAL  MEDIAL MENISCECTOMY;  Surgeon: [name], MD;  Location: [place];  Service: Orthopedics;   Laterality: Right;  . KNEE SURGERY    . NEPHRECTOMY    . PARTIAL KNEE ARTHROPLASTY Right 03/31/2018   Procedure: RIGHT KNEE UNI ARTHROPLASTY;  Surgeon: Marchia Bond, MD;  Location: [place];  Service: Orthopedics;  Laterality: Right;  . TONSILLECTOMY    . TOTAL KNEE ARTHROPLASTY Left 09/14/2013   Procedure: LEFT TOTAL KNEE ARTHROPLASTY;  Surgeon: [name], MD;  Location: [place];  Service: Orthopedics;  Laterality: Left;  . TUBAL LIGATION    . WISDOM TOOTH EXTRACTION      Current Medications: Current Meds  Medication Sig  . albuterol (PROVENTIL HFA;VENTOLIN HFA) 108 (90 Base) MCG/ACT inhaler Inhale 2 puffs into the lungs every 6 (six) hours as needed for wheezing or shortness of breath.  . calcium carbonate (TUMS - DOSED IN MG ELEMENTAL CALCIUM) 500 MG chewable tablet Chew 1 tablet by mouth as needed for indigestion or heartburn.  . Carboxymethylcellul-Glycerin (LUBRICATING EYE DROPS OP) Apply 1 drop to eye daily as needed (dry eyes).  . cetirizine (ZYRTEC) 10 MG tablet Take 1 tablet (10 mg total) by mouth at bedtime.  . Cholecalciferol (VITAMIN D3) 3000 units TABS Take 3,000 Units by mouth 2 (two) times a week. Wednesdays & Saturdays.  . citalopram (CELEXA) 20 MG tablet Take 0.5 tablets (  10 mg total) by mouth daily.  Marland Kitchen esomeprazole (NEXIUM) 40 MG capsule TAKE 1 CAPSULE (40 MG TOTAL) BY MOUTH DAILY BEFORE BREAKFAST.  . hydrochlorothiazide (MICROZIDE) 12.5 MG capsule Take 1 capsule (12.5 mg total) by mouth daily.  Marland Kitchen levothyroxine (SYNTHROID) 125 MCG tablet TAKE 1 TABLET BY MOUTH ONCE DAILY  . lisinopril (ZESTRIL) 2.5 MG tablet Take 1 tablet (2.5 mg total) by mouth daily.  . naproxen sodium (ANAPROX) 220 MG tablet Take 220-440 mg by mouth daily as needed (pain).  . rosuvastatin (CRESTOR) 10 MG tablet Take 1 tablet (10 mg total) by mouth daily.     Allergies:   Percocet [oxycodone-acetaminophen], Hydrocodone-acetaminophen, Lipitor [atorvastatin], Saxenda  [liraglutide -weight management], Shrimp [shellfish allergy], Belviq xr [lorcaserin hcl er], and Morphine and related   Social History   Socioeconomic History  . Marital status: Married    Spouse name: [name]  . Number of children: 4  . Years of education: 14+  . Highest education level: Not on file  Occupational History    Employer: [employer]  Tobacco Use  . Smoking status: Former Smoker    Quit date: 11/22/1983    Years since quitting: 36.3  . Smokeless tobacco: Never Used  Vaping Use  . Vaping Use: Never used  Substance and Sexual Activity  . Alcohol use: Yes    Comment: occasional  . Drug use: No  . Sexual activity: Yes    Partners: Male  Other Topics Concern  . Not on file  Social History Narrative   Marital Status:  Married Marine scientist)   Children: 4   Pets:  Dogs (2)   Living Situation: Lives with spouse, son and his family.     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[place]     Education:  Forensic psychologist; MSN    Tobacco Use/Exposure: None   Alcohol Use: Rarely   Drug Use:  None   Diet:  Low Fat    Exercise:  Limited (Walking Dogs)    Hobbies:  Reading, Movies             Social Determinants of Health   Financial Resource Strain:   . Difficulty of Paying Living Expenses: Not on file  Food Insecurity:   . Worried About Charity fundraiser in the Last Year: Not on file  . Ran Out of Food in the Last Year: Not on file  Transportation Needs:   . Lack of Transportation (Medical): Not on file  . Lack of Transportation (Non-Medical): Not on file  Physical Activity:   . Days of Exercise per Week: Not on file  . Minutes of Exercise per Session: Not on file  Stress:   . Feeling of Stress : Not on file  Social Connections:   . Frequency of Communication with Friends and Family: Not on file  . Frequency of Social Gatherings with Friends and Family: Not on file  . Attends Religious Services: Not on file  . Active Member of Clubs or Organizations: Not  on file  . Attends Archivist Meetings: Not on file  . Marital Status: Not on file     Family History: The patient's family history includes Cancer in her maternal aunt and maternal aunt; Diabetes in her mother; Heart disease in her father; Hypertension in her father and mother; Thyroid disease in her father. There is no history of Colon cancer, Esophageal cancer, Rectal cancer, or Stomach cancer.  ROS:   Please see the history of present illness.  All other systems reviewed and are negative.  EKGs/Labs/Other Studies Reviewed:    The following studies were reviewed today:  Echo 2016-EF 65%, no significant mitral regurgitation, mild aortic regurgitation  EKG:  EKG is  ordered today.  The ekg ordered today demonstrates sinus rhythm 79 with no other significant abnormalities.  Recent Labs: 05/22/2019: ALT 43; BUN 16; Creat 1.09; Hemoglobin 15.3; Platelets 496; Potassium 4.1; Sodium 140 10/12/2019: TSH 2.33  Recent Lipid Panel    Component Value Date/Time   CHOL 171 03/09/2019 0926   TRIG 120 03/09/2019 0926   HDL 50 03/09/2019 0926   CHOLHDL 3.4 03/09/2019 0926   VLDL 22 12/20/2014 0922   LDLCALC 99 03/09/2019 0926    Physical Exam:    VS:  BP 120/60   Pulse 79   Ht 5' 4.5" (1.638 m)   Wt 226 lb 12.8 oz (102.9 kg)   SpO2 98%   BMI 38.33 kg/m     Wt Readings from Last 3 Encounters:  04/04/20 226 lb 12.8 oz (102.9 kg)  02/14/20 229 lb (103.9 kg)  01/05/20 229 lb (103.9 kg)     GEN:  Well nourished, well developed in no acute distress HEENT: Normal NECK: No JVD; No carotid bruits LYMPHATICS: No lymphadenopathy CARDIAC: RRR, no murmurs, rubs, gallops RESPIRATORY:  Clear to auscultation without rales, wheezing or rhonchi  ABDOMEN: Soft, non-tender, non-distended MUSCULOSKELETAL:  No edema; No deformity  SKIN: Warm and dry NEUROLOGIC:  Alert and oriented x 3 PSYCHIATRIC:  Normal affect   ASSESSMENT:    1. Essential hypertension, benign   2.  Nonrheumatic aortic valve insufficiency    PLAN:    In order of problems listed above:  Mitral regurgitation/aortic regurgitation -Had only mild aortic regurgitation on prior echocardiogram with no significant mitral regurgitation seen.  Should not be of any major clinical significance.  We will go ahead and repeat echocardiogram to make sure that there has been no significant changes.  Palpitations -PVCs on monitor in the past.  Conservative management.  EKG reassuring today.  Try to decrease caffeine use.  Essential hypertension -Currently very well controlled.  No changes made.  Hyperlipidemia -Currently taking Crestor 10 mg a day LDL 99, hemoglobin 15.3, creatinine 1.09 potassium 4.1 ALT 43 outside labs.   Medication Adjustments/Labs and Tests Ordered: Current medicines are reviewed at length with the patient today.  Concerns regarding medicines are outlined above.  Orders Placed This Encounter  Procedures  . EKG 12-Lead  . ECHOCARDIOGRAM COMPLETE   No orders of the defined types were placed in this encounter.   Patient Instructions  Medication Instructions:  The current medical regimen is effective;  continue present plan and medications.  *If you need a refill on your cardiac medications before your next appointment, please call your pharmacy*  Testing/Procedures: Your physician has requested that you have an echocardiogram. Echocardiography is a painless test that uses sound waves to create images of your heart. It provides your doctor with information about the size and shape of your heart and how well your heart's chambers and valves are working. This procedure takes approximately one hour. There are no restrictions for this procedure.  Follow-Up: At [facility], you and your health needs are our priority.  As part of our continuing mission to provide you with exceptional heart care, we have created designated Provider Care Teams.  These Care Teams include your  primary Cardiologist (physician) and Advanced Practice Providers (APPs -  Physician Assistants and Nurse Practitioners) who all work  together to provide you with the care you need, when you need it.  We recommend signing up for the patient portal called "MyChart".  Sign up information is provided on this After Visit Summary.  MyChart is used to connect with patients for Virtual Visits (Telemedicine).  Patients are able to view lab/test results, encounter notes, upcoming appointments, etc.  Non-urgent messages can be sent to your provider as well.   To learn more about what you can do with MyChart, go to [email or web address].    Your next appointment:   2 year(s)  The format for your next appointment:   In Person  Provider:   Candee Furbish, MD   Thank you for choosing [facility]!!         Signed, Candee Furbish, MD  04/04/2020 11:56 AM    [place]

## 2020-04-09 MED FILL — LEVOTHYROXINE SODIUM 125 MC: 125 | 90 days supply | Qty: 90 | Fill #1

## 2020-04-11 ENCOUNTER — Other Ambulatory Visit: Payer: Self-pay

## 2020-04-11 ENCOUNTER — Ambulatory Visit (INDEPENDENT_AMBULATORY_CARE_PROVIDER_SITE_OTHER): Payer: 59

## 2020-04-11 ENCOUNTER — Ambulatory Visit (INDEPENDENT_AMBULATORY_CARE_PROVIDER_SITE_OTHER): Payer: 59 | Admitting: Physician Assistant

## 2020-04-11 VITALS — BP 131/52 | HR 67 | Ht 64.5 in | Wt 230.0 lb

## 2020-04-11 DIAGNOSIS — N1831 Chronic kidney disease, stage 3a: Secondary | ICD-10-CM | POA: Diagnosis not present

## 2020-04-11 DIAGNOSIS — M542 Cervicalgia: Secondary | ICD-10-CM | POA: Diagnosis not present

## 2020-04-11 DIAGNOSIS — M79601 Pain in right arm: Secondary | ICD-10-CM

## 2020-04-11 DIAGNOSIS — I10 Essential (primary) hypertension: Secondary | ICD-10-CM | POA: Diagnosis not present

## 2020-04-11 DIAGNOSIS — H8112 Benign paroxysmal vertigo, left ear: Secondary | ICD-10-CM | POA: Diagnosis not present

## 2020-04-11 DIAGNOSIS — G43109 Migraine with aura, not intractable, without status migrainosus: Secondary | ICD-10-CM | POA: Diagnosis not present

## 2020-04-11 DIAGNOSIS — Z23 Encounter for immunization: Secondary | ICD-10-CM

## 2020-04-11 DIAGNOSIS — E782 Mixed hyperlipidemia: Secondary | ICD-10-CM | POA: Diagnosis not present

## 2020-04-11 DIAGNOSIS — R29898 Other symptoms and signs involving the musculoskeletal system: Secondary | ICD-10-CM

## 2020-04-11 DIAGNOSIS — M5030 Other cervical disc degeneration, unspecified cervical region: Secondary | ICD-10-CM

## 2020-04-11 DIAGNOSIS — E039 Hypothyroidism, unspecified: Secondary | ICD-10-CM | POA: Diagnosis not present

## 2020-04-11 DIAGNOSIS — R7303 Prediabetes: Secondary | ICD-10-CM | POA: Diagnosis not present

## 2020-04-11 MED ORDER — PREDNISONE 50 MG PO TABS: ORAL_TABLET | ORAL | 0 refills | Status: DC

## 2020-04-11 MED FILL — predniSONE 50 MG TABS: 50 | 5 days supply | Qty: 5 | Fill #0

## 2020-04-11 NOTE — Progress Notes (Signed)
Subjective:    Patient ID: [name], female    DOB: 11-07-1955, 64 y.o.   MRN: [id number]  HPI  Pt is a 64 yo obese female with hx of cervical DDD and chronic neck pain who presents to the clinic with new right arm weakness and achy feeling for the last month or so. Pt is right handed.  Symptoms seem to distribute down lateral right arm to elbow. She denies any tingling or numbness. She is not taking anything for symptoms. She had a CT of neck in 2019 with significant osteophyte formation and degeneration. Never had injections. She is worried because she is started to feel weak overall in the right arm. No new trauma.  .. Active Ambulatory Problems    Diagnosis Date Noted  . UTI (urinary tract infection) 07/14/2012  . Other and unspecified hyperlipidemia 07/14/2012  . MVP (mitral valve prolapse) 07/14/2012  . GERD (gastroesophageal reflux disease) 07/14/2012  . Allergic rhinitis 07/14/2012  . Mitral regurgitation 07/14/2012  . S/P hysterectomy 07/14/2012  . Essential hypertension, benign 04/29/2013  . Wheezing 04/29/2013  . Insomnia 04/29/2013  . Unspecified vitamin D deficiency 04/29/2013  . Migraine headache 04/29/2013  . Anxiety state 04/29/2013  . Unspecified hypothyroidism 04/29/2013  . Obesity (BMI 30-39.9) 07/17/2013  . Cough 08/15/2013  . Arthritis of left knee 09/14/2013  . Status post total knee replacement 09/14/2013  . Abdominal pain, chronic, right upper quadrant 11/16/2013  . Candidiasis of female genitalia 11/16/2013  . Elevated liver enzymes 11/16/2013  . Folliculitis 54/62/7035  . Nausea alone 11/18/2013  . Abnormal transaminases 11/18/2013  . Hyperlipidemia 11/22/2014  . Single kidney 11/22/2014  . Hypothyroidism 11/22/2014  . OSA (obstructive sleep apnea) 12/23/2015  . Daytime sleepiness 01/24/2017  . Carpal tunnel syndrome, left upper limb 02/25/2017  . Status post carpal tunnel release 03/11/2017  . Hypertriglyceridemia 07/14/2017  . Elevated  fasting glucose 07/14/2017  . Chronic pain of right knee 09/13/2017  . Unilateral primary osteoarthritis, right knee 09/27/2017  . Pre-diabetes 10/19/2017  . Pain in thumb joint with movement of right hand 10/19/2017  . Class 2 obesity due to excess calories without serious comorbidity with body mass index (BMI) of 35.0 to 35.9 in adult 10/19/2017  . Other meniscus derangements, posterior horn of medial meniscus, right knee 11/24/2017  . Pain of right thumb 11/24/2017  . Trigger thumb, right thumb 11/24/2017  . BPPV (benign paroxysmal positional vertigo), right 02/20/2018  . Concussion wth loss of consciousness of 30 minutes or less 02/20/2018  . Fall 02/20/2018  . CKD (chronic kidney disease) stage 3, GFR 30-59 ml/min 03/10/2019  . BPPV (benign paroxysmal positional vertigo), left 05/22/2019  . Pain radiating to back 05/23/2019  . Elevated lipase 06/12/2019  . Seborrheic keratoses 01/05/2020  . Papules 01/05/2020  . Class 2 obesity due to excess calories without serious comorbidity with body mass index (BMI) of 38.0 to 38.9 in adult 01/05/2020  . Anxiety and depression 06/03/2014  . Fracture of triquetral bone of wrist 02/21/2018  . Primary osteoarthritis involving multiple joints 06/03/2014  . Degenerative disc disease, cervical 04/11/2020  . Right arm pain 04/16/2020  . Right arm weakness 04/16/2020   Resolved Ambulatory Problems    Diagnosis Date Noted  . Hypothyroidism 07/14/2012   Past Medical History:  Diagnosis Date  . Acute medial meniscal tear   . Allergy   . Anxiety   . Aortic insufficiency   . Aortic regurgitation   . Arthritis   . Carpal tunnel  syndrome   . Chronic kidney disease   . Dysrhythmia   . Mitral valve prolapse   . Need for SBE (subacute bacterial endocarditis) prophylaxis   . Pilonidal cyst without mention of abscess   . Pneumonia Aug 14 2012  . PONV (postoperative nausea and vomiting)   . Sleep apnea   . Thyroid disease   . Transplanted kidney  removed       Review of Systems  All other systems reviewed and are negative.      Objective:   Physical Exam Vitals reviewed.  Constitutional:      Appearance: Normal appearance. She is obese.  HENT:     Head: Normocephalic.  Cardiovascular:     Rate and Rhythm: Normal rate.  Pulmonary:     Effort: Pulmonary effort is normal.  Musculoskeletal:     Comments: Decreased ROM of neck due to pain and stiffness.  No tenderness to palpation over cervical spine.  Tight right upper paraspinal/shoulder muscles.  NROM of right shoulder.  No tenderness to palpation of shoulder.  Strength 4/5 on right arm.  Hand grip 5/5.  1+antecubital reflexes on right compared to 2+ on left.   Neurological:     General: No focal deficit present.     Mental Status: She is alert and oriented to person, place, and time.  Psychiatric:        Mood and Affect: Mood normal.           Assessment & Plan:  Marland KitchenMarland KitchenDenetria was seen today for arm pain.  Diagnoses and all orders for this visit:  Degenerative disc disease, cervical -     predniSONE (DELTASONE) 50 MG tablet; One tab PO daily for 5 days. -     DG Cervical Spine Complete -     MR Cervical Spine Wo Contrast  Need for pneumococcal vaccine -     Pneumococcal polysaccharide vaccine 23-valent greater than or equal to 2yo subcutaneous/IM  Right arm pain -     predniSONE (DELTASONE) 50 MG tablet; One tab PO daily for 5 days. -     DG Cervical Spine Complete -     MR Cervical Spine Wo Contrast  Right arm weakness -     predniSONE (DELTASONE) 50 MG tablet; One tab PO daily for 5 days. -     DG Cervical Spine Complete -     MR Cervical Spine Wo Contrast   Distribution of symptoms align with C5-C6  Last CT of neck 02/2018.   IMPRESSION: 1.  No acute intracranial abnormality.  No skull fracture. 2. Straightening of normal lordosis may be positional or seen with muscle spasm. No evidence of acute cervical spine fracture. 3. Multilevel  degenerative disc disease and facet arthropathy. Multilevel neural foraminal stenosis and spinal canal stenosis at C6-C7.  Will repeat neck xray today. Consider MRI and referral. Pt request neurosurgery referral if needed.  CKD-avoid NSAIDs. Needs labs. Ordered today.  Prednisone burst given.  Flexeril as needed.  Exercises given to start.  Discussed icy hot, tens unit.

## 2020-04-16 ENCOUNTER — Other Ambulatory Visit: Payer: Self-pay

## 2020-04-16 ENCOUNTER — Ambulatory Visit (HOSPITAL_COMMUNITY): Payer: 59 | Attending: Cardiology

## 2020-04-16 ENCOUNTER — Encounter: Payer: Self-pay | Admitting: Physician Assistant

## 2020-04-16 DIAGNOSIS — I351 Nonrheumatic aortic (valve) insufficiency: Secondary | ICD-10-CM | POA: Insufficient documentation

## 2020-04-16 DIAGNOSIS — R29898 Other symptoms and signs involving the musculoskeletal system: Secondary | ICD-10-CM | POA: Insufficient documentation

## 2020-04-16 DIAGNOSIS — M79601 Pain in right arm: Secondary | ICD-10-CM | POA: Insufficient documentation

## 2020-04-16 LAB — ECHOCARDIOGRAM COMPLETE
AR max vel: 2.44 cm2
AV Area VTI: 2.79 cm2
AV Area mean vel: 2.53 cm2
AV Mean grad: 7 mmHg
AV Peak grad: 12.6 mmHg
Ao pk vel: 1.77 m/s
Area-P 1/2: 3.6 cm2
P 1/2 time: 511 msec
S' Lateral: 3.1 cm

## 2020-04-16 MED ORDER — PERFLUTREN LIPID MICROSPHERE
1.0000 mL | INTRAVENOUS | Status: AC | PRN
  Administered 2020-04-16: 2 mL via INTRAVENOUS

## 2020-04-16 NOTE — Progress Notes (Signed)
[name],   No acute changes seen but still significant degeneration and osteophytes as well as narrowing at the neck level that correlates to your arm pain and weakness. What neurosurgeon would you like referral too?

## 2020-04-16 NOTE — Progress Notes (Signed)
Referral placed.

## 2020-04-16 NOTE — Addendum Note (Signed)
Addended by: Donella Stade on: 04/16/2020 04:10 PM   Modules accepted: Orders

## 2020-04-16 NOTE — Progress Notes (Signed)
[name],   Cholesterol looks GREAT.  Thyroid stable.  A1C is still in pre-diabetes range but elevated from last check. We will watch this closely but a1C over 6 I do start the conversation about metformin. Would you be interested?  Liver enzymes up. Are you taking tylenol right now? Kidney function down some? This could be from any anti-inflammatory. I would stop any NSAIDs for now.  RBC and hematocrit up hemogloblin normal. Will add serum iron and ferritin to labs.

## 2020-04-17 ENCOUNTER — Other Ambulatory Visit: Payer: Self-pay | Admitting: Physician Assistant

## 2020-04-17 ENCOUNTER — Encounter: Payer: Self-pay | Admitting: Physician Assistant

## 2020-04-17 DIAGNOSIS — E611 Iron deficiency: Secondary | ICD-10-CM | POA: Insufficient documentation

## 2020-04-17 LAB — CBC
HCT: 48.4 % — ABNORMAL HIGH (ref 35.0–45.0)
Hemoglobin: 15.5 g/dL (ref 11.7–15.5)
MCH: 24.8 pg — ABNORMAL LOW (ref 27.0–33.0)
MCHC: 32 g/dL (ref 32.0–36.0)
MCV: 77.3 fL — ABNORMAL LOW (ref 80.0–100.0)
MPV: 10.1 fL (ref 7.5–12.5)
Platelets: 390 10*3/uL (ref 140–400)
RBC: 6.26 10*6/uL — ABNORMAL HIGH (ref 3.80–5.10)
RDW: 17.4 % — ABNORMAL HIGH (ref 11.0–15.0)
WBC: 6.8 10*3/uL (ref 3.8–10.8)

## 2020-04-17 LAB — COMPLETE METABOLIC PANEL WITH GFR
AG Ratio: 1.8 (calc) (ref 1.0–2.5)
ALT: 69 U/L — ABNORMAL HIGH (ref 6–29)
AST: 50 U/L — ABNORMAL HIGH (ref 10–35)
Albumin: 4.2 g/dL (ref 3.6–5.1)
Alkaline phosphatase (APISO): 74 U/L (ref 37–153)
BUN/Creatinine Ratio: 13 (calc) (ref 6–22)
BUN: 17 mg/dL (ref 7–25)
CO2: 31 mmol/L (ref 20–32)
Calcium: 9.7 mg/dL (ref 8.6–10.4)
Chloride: 104 mmol/L (ref 98–110)
Creat: 1.28 mg/dL — ABNORMAL HIGH (ref 0.50–0.99)
GFR, Est African American: 51 mL/min/{1.73_m2} — ABNORMAL LOW (ref >=60)
GFR, Est Non African American: 44 mL/min/{1.73_m2} — ABNORMAL LOW (ref >=60)
Globulin: 2.3 g/dL (calc) (ref 1.9–3.7)
Glucose, Bld: 88 mg/dL (ref 65–99)
Potassium: 4.4 mmol/L (ref 3.5–5.3)
Sodium: 142 mmol/L (ref 135–146)
Total Bilirubin: 0.8 mg/dL (ref 0.2–1.2)
Total Protein: 6.5 g/dL (ref 6.1–8.1)

## 2020-04-17 LAB — IRON,TIBC AND FERRITIN PANEL
%SAT: 8 % (calc) — ABNORMAL LOW (ref 16–45)
Ferritin: 15 ng/mL — ABNORMAL LOW (ref 16–288)
Iron: 37 ug/dL — ABNORMAL LOW (ref 45–160)
TIBC: 483 mcg/dL (calc) — ABNORMAL HIGH (ref 250–450)

## 2020-04-17 LAB — LIPID PANEL W/REFLEX DIRECT LDL
Cholesterol: 171 mg/dL (ref <200)
HDL: 56 mg/dL (ref >=50)
LDL Cholesterol (Calc): 91 mg/dL (calc)
Non-HDL Cholesterol (Calc): 115 mg/dL (calc) (ref <130)
Total CHOL/HDL Ratio: 3.1 (calc) (ref <5.0)
Triglycerides: 143 mg/dL (ref <150)

## 2020-04-17 LAB — HEMOGLOBIN A1C
Hgb A1c MFr Bld: 6.1 % of total Hgb — ABNORMAL HIGH (ref <5.7)
Mean Plasma Glucose: 128 (calc)
eAG (mmol/L): 7.1 (calc)

## 2020-04-17 LAB — TSH: TSH: 2.71 mIU/L (ref 0.40–4.50)

## 2020-04-17 MED ORDER — METFORMIN HCL 500 MG PO TABS: 500.0000 mg | ORAL_TABLET | Freq: Two times a day (BID) | ORAL | 3 refills | Status: DC

## 2020-04-17 MED ORDER — FERROUS SULFATE 325 (65 FE) MG PO TBEC: 325.0000 mg | DELAYED_RELEASE_TABLET | Freq: Every day | ORAL | 1 refills | Status: DC

## 2020-04-17 MED FILL — FERROUS SULFATE 325 MG TAB: 325 (65 FE) | 100 days supply | Qty: 100 | Fill #0

## 2020-04-17 MED FILL — METFORMIN HCL 500 MG TABS: 500 | 90 days supply | Qty: 180 | Fill #0

## 2020-04-17 NOTE — Progress Notes (Signed)
I would also start a low dose 81mg  of ASA daily.

## 2020-04-17 NOTE — Addendum Note (Signed)
Addended by: Donella Stade on: 04/17/2020 04:06 PM   Modules accepted: Orders

## 2020-04-17 NOTE — Progress Notes (Signed)
Your blood work shows iron deficiency without anemia. Your serum iron and iron stores are low. Your last colonoscopy was 2014. I want you to get back in with GI a little early since you have no blood low but iron is low. I want you to start ferrous sulfate with breakfast and repeat labs in 2 months.

## 2020-04-18 ENCOUNTER — Encounter: Payer: Self-pay | Admitting: Physician Assistant

## 2020-04-22 ENCOUNTER — Other Ambulatory Visit: Payer: Self-pay | Admitting: Physician Assistant

## 2020-04-22 MED ORDER — OZEMPIC (0.25 OR 0.5 MG/DOSE) 2 MG/1.5ML ~~LOC~~ SOPN: 0.2500 mg | PEN_INJECTOR | SUBCUTANEOUS | 2 refills | Status: DC

## 2020-04-22 MED FILL — CITALOPRAM HBR 20 MG TABLET: 20 | 90 days supply | Qty: 45 | Fill #1

## 2020-04-22 MED FILL — OZEMPIC 0.25 OR 0.5 MG/DOSE: 2 | 56 days supply | Qty: 2 | Fill #0

## 2020-04-22 MED FILL — ESOMEPRAZOLE MAG DR 40 MG C: 40 | 90 days supply | Qty: 90 | Fill #1

## 2020-04-22 MED FILL — LISINOPRIL 2.5 MG TABLET: 2.5 | 90 days supply | Qty: 90 | Fill #1

## 2020-05-15 ENCOUNTER — Other Ambulatory Visit (HOSPITAL_BASED_OUTPATIENT_CLINIC_OR_DEPARTMENT_OTHER): Payer: Self-pay | Admitting: Neurosurgery

## 2020-05-15 ENCOUNTER — Encounter: Payer: Self-pay | Admitting: Physician Assistant

## 2020-05-15 DIAGNOSIS — Z6838 Body mass index (BMI) 38.0-38.9, adult: Secondary | ICD-10-CM | POA: Diagnosis not present

## 2020-05-15 DIAGNOSIS — M4322 Fusion of spine, cervical region: Secondary | ICD-10-CM

## 2020-05-15 DIAGNOSIS — I10 Essential (primary) hypertension: Secondary | ICD-10-CM | POA: Diagnosis not present

## 2020-05-15 DIAGNOSIS — M5412 Radiculopathy, cervical region: Secondary | ICD-10-CM | POA: Diagnosis not present

## 2020-05-15 DIAGNOSIS — M4712 Other spondylosis with myelopathy, cervical region: Secondary | ICD-10-CM | POA: Diagnosis not present

## 2020-05-15 DIAGNOSIS — M79601 Pain in right arm: Secondary | ICD-10-CM

## 2020-05-15 DIAGNOSIS — M2578 Osteophyte, vertebrae: Secondary | ICD-10-CM | POA: Diagnosis not present

## 2020-05-15 DIAGNOSIS — M4802 Spinal stenosis, cervical region: Secondary | ICD-10-CM | POA: Diagnosis not present

## 2020-05-15 DIAGNOSIS — M542 Cervicalgia: Secondary | ICD-10-CM | POA: Diagnosis not present

## 2020-05-15 DIAGNOSIS — G959 Disease of spinal cord, unspecified: Secondary | ICD-10-CM | POA: Diagnosis not present

## 2020-05-15 MED FILL — traMADol HCL 50 MG TABS: 50 | 15 days supply | Qty: 60 | Fill #0

## 2020-05-16 ENCOUNTER — Other Ambulatory Visit: Payer: Self-pay | Admitting: Physician Assistant

## 2020-05-16 ENCOUNTER — Other Ambulatory Visit: Payer: Self-pay

## 2020-05-16 ENCOUNTER — Ambulatory Visit (HOSPITAL_COMMUNITY)
Admission: RE | Admit: 2020-05-16 | Discharge: 2020-05-16 | Disposition: A | Discharge status: Home / Self Care | Payer: 59 | Source: Ambulatory Visit | Attending: Physician Assistant | Admitting: Physician Assistant

## 2020-05-16 DIAGNOSIS — M4802 Spinal stenosis, cervical region: Secondary | ICD-10-CM | POA: Diagnosis not present

## 2020-05-16 DIAGNOSIS — M5030 Other cervical disc degeneration, unspecified cervical region: Secondary | ICD-10-CM | POA: Diagnosis not present

## 2020-05-16 DIAGNOSIS — M79601 Pain in right arm: Secondary | ICD-10-CM | POA: Insufficient documentation

## 2020-05-16 DIAGNOSIS — M5023 Other cervical disc displacement, cervicothoracic region: Secondary | ICD-10-CM | POA: Diagnosis not present

## 2020-05-16 DIAGNOSIS — R29898 Other symptoms and signs involving the musculoskeletal system: Secondary | ICD-10-CM | POA: Diagnosis not present

## 2020-05-16 DIAGNOSIS — R531 Weakness: Secondary | ICD-10-CM | POA: Diagnosis not present

## 2020-05-16 DIAGNOSIS — M5021 Other cervical disc displacement,  high cervical region: Secondary | ICD-10-CM | POA: Diagnosis not present

## 2020-05-16 DIAGNOSIS — E782 Mixed hyperlipidemia: Secondary | ICD-10-CM

## 2020-05-16 DIAGNOSIS — E781 Pure hyperglyceridemia: Secondary | ICD-10-CM

## 2020-05-16 MED FILL — ROSUVASTATIN CALCIUM 10 MG: 10 | 90 days supply | Qty: 90 | Fill #0

## 2020-05-16 NOTE — Telephone Encounter (Signed)
Rheumatologist referral pended.

## 2020-05-17 NOTE — Progress Notes (Signed)
Do we need to fax this to Dr. Vertell Limber? Or did he already see it. Lots of bone spurs and stenosis.

## 2020-05-20 DIAGNOSIS — I10 Essential (primary) hypertension: Secondary | ICD-10-CM | POA: Diagnosis not present

## 2020-05-20 DIAGNOSIS — Z6838 Body mass index (BMI) 38.0-38.9, adult: Secondary | ICD-10-CM | POA: Diagnosis not present

## 2020-05-20 DIAGNOSIS — M5000 Cervical disc disorder with myelopathy, unspecified cervical region: Secondary | ICD-10-CM | POA: Diagnosis not present

## 2020-05-20 DIAGNOSIS — M5412 Radiculopathy, cervical region: Secondary | ICD-10-CM | POA: Diagnosis not present

## 2020-05-22 ENCOUNTER — Other Ambulatory Visit (HOSPITAL_COMMUNITY): Payer: Self-pay | Admitting: Neurosurgery

## 2020-05-22 DIAGNOSIS — G959 Disease of spinal cord, unspecified: Secondary | ICD-10-CM

## 2020-05-23 DIAGNOSIS — G4733 Obstructive sleep apnea (adult) (pediatric): Secondary | ICD-10-CM | POA: Diagnosis not present

## 2020-05-28 ENCOUNTER — Emergency Department (HOSPITAL_COMMUNITY): Payer: 59 | Admitting: Registered Nurse

## 2020-05-28 ENCOUNTER — Inpatient Hospital Stay (HOSPITAL_COMMUNITY)
Admission: EM | Admit: 2020-05-28 | Discharge: 2020-05-31 | DRG: 907 | Disposition: A | Discharge status: Home / Self Care | Payer: 59 | Source: Ambulatory Visit | Attending: Neurosurgery | Admitting: Neurosurgery

## 2020-05-28 ENCOUNTER — Emergency Department (HOSPITAL_COMMUNITY): Payer: 59

## 2020-05-28 ENCOUNTER — Encounter (HOSPITAL_COMMUNITY)
Admission: EM | Disposition: A | Discharge status: Home / Self Care | Payer: Self-pay | Source: Ambulatory Visit | Attending: Neurosurgery

## 2020-05-28 ENCOUNTER — Encounter (HOSPITAL_COMMUNITY): Payer: Self-pay

## 2020-05-28 ENCOUNTER — Other Ambulatory Visit: Payer: Self-pay

## 2020-05-28 ENCOUNTER — Inpatient Hospital Stay (HOSPITAL_COMMUNITY): Payer: 59

## 2020-05-28 ENCOUNTER — Other Ambulatory Visit (HOSPITAL_BASED_OUTPATIENT_CLINIC_OR_DEPARTMENT_OTHER): Payer: Self-pay | Admitting: Neurosurgery

## 2020-05-28 DIAGNOSIS — Z8249 Family history of ischemic heart disease and other diseases of the circulatory system: Secondary | ICD-10-CM

## 2020-05-28 DIAGNOSIS — I080 Rheumatic disorders of both mitral and aortic valves: Secondary | ICD-10-CM | POA: Diagnosis present

## 2020-05-28 DIAGNOSIS — J385 Laryngeal spasm: Secondary | ICD-10-CM | POA: Diagnosis present

## 2020-05-28 DIAGNOSIS — N1831 Chronic kidney disease, stage 3a: Secondary | ICD-10-CM | POA: Diagnosis present

## 2020-05-28 DIAGNOSIS — Z20822 Contact with and (suspected) exposure to covid-19: Secondary | ICD-10-CM | POA: Diagnosis present

## 2020-05-28 DIAGNOSIS — J988 Other specified respiratory disorders: Secondary | ICD-10-CM | POA: Diagnosis not present

## 2020-05-28 DIAGNOSIS — L7632 Postprocedural hematoma of skin and subcutaneous tissue following other procedure: Secondary | ICD-10-CM | POA: Diagnosis present

## 2020-05-28 DIAGNOSIS — G4733 Obstructive sleep apnea (adult) (pediatric): Secondary | ICD-10-CM | POA: Diagnosis not present

## 2020-05-28 DIAGNOSIS — E039 Hypothyroidism, unspecified: Secondary | ICD-10-CM | POA: Diagnosis present

## 2020-05-28 DIAGNOSIS — E7849 Other hyperlipidemia: Secondary | ICD-10-CM | POA: Diagnosis not present

## 2020-05-28 DIAGNOSIS — R0902 Hypoxemia: Secondary | ICD-10-CM | POA: Diagnosis not present

## 2020-05-28 DIAGNOSIS — I129 Hypertensive chronic kidney disease with stage 1 through stage 4 chronic kidney disease, or unspecified chronic kidney disease: Secondary | ICD-10-CM | POA: Diagnosis not present

## 2020-05-28 DIAGNOSIS — R061 Stridor: Secondary | ICD-10-CM | POA: Diagnosis not present

## 2020-05-28 DIAGNOSIS — M2578 Osteophyte, vertebrae: Secondary | ICD-10-CM | POA: Diagnosis present

## 2020-05-28 DIAGNOSIS — M96840 Postprocedural hematoma of a musculoskeletal structure following a musculoskeletal system procedure: Secondary | ICD-10-CM | POA: Diagnosis not present

## 2020-05-28 DIAGNOSIS — Z885 Allergy status to narcotic agent status: Secondary | ICD-10-CM

## 2020-05-28 DIAGNOSIS — J9601 Acute respiratory failure with hypoxia: Secondary | ICD-10-CM | POA: Diagnosis present

## 2020-05-28 DIAGNOSIS — E1165 Type 2 diabetes mellitus with hyperglycemia: Secondary | ICD-10-CM | POA: Diagnosis present

## 2020-05-28 DIAGNOSIS — E1122 Type 2 diabetes mellitus with diabetic chronic kidney disease: Secondary | ICD-10-CM | POA: Diagnosis present

## 2020-05-28 DIAGNOSIS — Z833 Family history of diabetes mellitus: Secondary | ICD-10-CM

## 2020-05-28 DIAGNOSIS — M2469 Ankylosis, other specified joint: Secondary | ICD-10-CM | POA: Diagnosis not present

## 2020-05-28 DIAGNOSIS — I10 Essential (primary) hypertension: Secondary | ICD-10-CM | POA: Diagnosis not present

## 2020-05-28 DIAGNOSIS — Z6841 Body Mass Index (BMI) 40.0 and over, adult: Secondary | ICD-10-CM

## 2020-05-28 DIAGNOSIS — J384 Edema of larynx: Secondary | ICD-10-CM | POA: Diagnosis present

## 2020-05-28 DIAGNOSIS — Z888 Allergy status to other drugs, medicaments and biological substances status: Secondary | ICD-10-CM

## 2020-05-28 DIAGNOSIS — Z90710 Acquired absence of both cervix and uterus: Secondary | ICD-10-CM

## 2020-05-28 DIAGNOSIS — E785 Hyperlipidemia, unspecified: Secondary | ICD-10-CM | POA: Diagnosis present

## 2020-05-28 DIAGNOSIS — F419 Anxiety disorder, unspecified: Secondary | ICD-10-CM | POA: Diagnosis present

## 2020-05-28 DIAGNOSIS — J9811 Atelectasis: Secondary | ICD-10-CM | POA: Diagnosis not present

## 2020-05-28 DIAGNOSIS — Z4682 Encounter for fitting and adjustment of non-vascular catheter: Secondary | ICD-10-CM | POA: Diagnosis not present

## 2020-05-28 DIAGNOSIS — J9382 Other air leak: Secondary | ICD-10-CM | POA: Diagnosis not present

## 2020-05-28 DIAGNOSIS — M50123 Cervical disc disorder at C6-C7 level with radiculopathy: Secondary | ICD-10-CM | POA: Diagnosis not present

## 2020-05-28 DIAGNOSIS — K219 Gastro-esophageal reflux disease without esophagitis: Secondary | ICD-10-CM | POA: Diagnosis present

## 2020-05-28 DIAGNOSIS — M4722 Other spondylosis with radiculopathy, cervical region: Secondary | ICD-10-CM | POA: Diagnosis not present

## 2020-05-28 DIAGNOSIS — M5000 Cervical disc disorder with myelopathy, unspecified cervical region: Secondary | ICD-10-CM | POA: Diagnosis not present

## 2020-05-28 DIAGNOSIS — G43909 Migraine, unspecified, not intractable, without status migrainosus: Secondary | ICD-10-CM | POA: Diagnosis present

## 2020-05-28 DIAGNOSIS — R000 Tachycardia, unspecified: Secondary | ICD-10-CM | POA: Diagnosis not present

## 2020-05-28 DIAGNOSIS — I517 Cardiomegaly: Secondary | ICD-10-CM | POA: Diagnosis not present

## 2020-05-28 DIAGNOSIS — T380X5A Adverse effect of glucocorticoids and synthetic analogues, initial encounter: Secondary | ICD-10-CM | POA: Diagnosis present

## 2020-05-28 DIAGNOSIS — Z8349 Family history of other endocrine, nutritional and metabolic diseases: Secondary | ICD-10-CM

## 2020-05-28 DIAGNOSIS — J90 Pleural effusion, not elsewhere classified: Secondary | ICD-10-CM | POA: Diagnosis not present

## 2020-05-28 DIAGNOSIS — N183 Chronic kidney disease, stage 3 unspecified: Secondary | ICD-10-CM | POA: Diagnosis not present

## 2020-05-28 DIAGNOSIS — E669 Obesity, unspecified: Secondary | ICD-10-CM | POA: Diagnosis not present

## 2020-05-28 DIAGNOSIS — Z981 Arthrodesis status: Secondary | ICD-10-CM | POA: Diagnosis not present

## 2020-05-28 DIAGNOSIS — R0602 Shortness of breath: Secondary | ICD-10-CM | POA: Diagnosis not present

## 2020-05-28 DIAGNOSIS — Z96653 Presence of artificial knee joint, bilateral: Secondary | ICD-10-CM | POA: Diagnosis present

## 2020-05-28 DIAGNOSIS — Z87891 Personal history of nicotine dependence: Secondary | ICD-10-CM

## 2020-05-28 DIAGNOSIS — J9690 Respiratory failure, unspecified, unspecified whether with hypoxia or hypercapnia: Secondary | ICD-10-CM | POA: Diagnosis not present

## 2020-05-28 DIAGNOSIS — K3189 Other diseases of stomach and duodenum: Secondary | ICD-10-CM | POA: Diagnosis not present

## 2020-05-28 DIAGNOSIS — Y838 Other surgical procedures as the cause of abnormal reaction of the patient, or of later complication, without mention of misadventure at the time of the procedure: Secondary | ICD-10-CM | POA: Diagnosis present

## 2020-05-28 DIAGNOSIS — Z905 Acquired absence of kidney: Secondary | ICD-10-CM

## 2020-05-28 DIAGNOSIS — Z0189 Encounter for other specified special examinations: Secondary | ICD-10-CM

## 2020-05-28 DIAGNOSIS — R230 Cyanosis: Secondary | ICD-10-CM | POA: Diagnosis present

## 2020-05-28 HISTORY — PX: WOUND EXPLORATION: SHX6188

## 2020-05-28 HISTORY — PX: ANTERIOR CERVICAL DECOMP/DISCECTOMY FUSION: SHX1161

## 2020-05-28 LAB — POCT I-STAT 7, (LYTES, BLD GAS, ICA,H+H)
Acid-Base Excess: 2 mmol/L (ref 0.0–2.0)
Bicarbonate: 27.4 mmol/L (ref 20.0–28.0)
Calcium, Ion: 1.3 mmol/L (ref 1.15–1.40)
HCT: 44 % (ref 36.0–46.0)
Hemoglobin: 15 g/dL (ref 12.0–15.0)
O2 Saturation: 95 %
Patient temperature: 97.6
Potassium: 3.5 mmol/L (ref 3.5–5.1)
Sodium: 137 mmol/L (ref 135–145)
TCO2: 29 mmol/L (ref 22–32)
pCO2 arterial: 44.9 mmHg (ref 32.0–48.0)
pH, Arterial: 7.391 (ref 7.350–7.450)
pO2, Arterial: 76 mmHg — ABNORMAL LOW (ref 83.0–108.0)

## 2020-05-28 LAB — I-STAT CHEM 8, ED
BUN: 14 mg/dL (ref 8–23)
Calcium, Ion: 1.33 mmol/L (ref 1.15–1.40)
Chloride: 100 mmol/L (ref 98–111)
Creatinine, Ser: 1 mg/dL (ref 0.44–1.00)
Glucose, Bld: 173 mg/dL — ABNORMAL HIGH (ref 70–99)
HCT: 53 % — ABNORMAL HIGH (ref 36.0–46.0)
Hemoglobin: 18 g/dL — ABNORMAL HIGH (ref 12.0–15.0)
Potassium: 3.6 mmol/L (ref 3.5–5.1)
Sodium: 139 mmol/L (ref 135–145)
TCO2: 28 mmol/L (ref 22–32)

## 2020-05-28 LAB — CBC WITH DIFFERENTIAL/PLATELET
Abs Immature Granulocytes: 0.24 10*3/uL — ABNORMAL HIGH (ref 0.00–0.07)
Basophils Absolute: 0.1 10*3/uL (ref 0.0–0.1)
Basophils Relative: 1 %
Eosinophils Absolute: 0.2 10*3/uL (ref 0.0–0.5)
Eosinophils Relative: 1 %
HCT: 53.6 % — ABNORMAL HIGH (ref 36.0–46.0)
Hemoglobin: 16.1 g/dL — ABNORMAL HIGH (ref 12.0–15.0)
Immature Granulocytes: 1 %
Lymphocytes Relative: 13 %
Lymphs Abs: 3.2 10*3/uL (ref 0.7–4.0)
MCH: 24.1 pg — ABNORMAL LOW (ref 26.0–34.0)
MCHC: 30 g/dL (ref 30.0–36.0)
MCV: 80.1 fL (ref 80.0–100.0)
Monocytes Absolute: 0.8 10*3/uL (ref 0.1–1.0)
Monocytes Relative: 3 %
Neutro Abs: 19.8 10*3/uL — ABNORMAL HIGH (ref 1.7–7.7)
Neutrophils Relative %: 81 %
Platelets: 676 10*3/uL — ABNORMAL HIGH (ref 150–400)
RBC: 6.69 MIL/uL — ABNORMAL HIGH (ref 3.87–5.11)
RDW: 18.6 % — ABNORMAL HIGH (ref 11.5–15.5)
WBC: 24.3 10*3/uL — ABNORMAL HIGH (ref 4.0–10.5)
nRBC: 0 % (ref 0.0–0.2)

## 2020-05-28 LAB — BASIC METABOLIC PANEL
Anion gap: 11 (ref 5–15)
BUN: 13 mg/dL (ref 8–23)
CO2: 27 mmol/L (ref 22–32)
Calcium: 9.9 mg/dL (ref 8.9–10.3)
Chloride: 98 mmol/L (ref 98–111)
Creatinine, Ser: 1.2 mg/dL — ABNORMAL HIGH (ref 0.44–1.00)
GFR, Estimated: 47 mL/min — ABNORMAL LOW (ref >60)
Glucose, Bld: 172 mg/dL — ABNORMAL HIGH (ref 70–99)
Potassium: 3.5 mmol/L (ref 3.5–5.1)
Sodium: 136 mmol/L (ref 135–145)

## 2020-05-28 LAB — GLUCOSE, CAPILLARY: Glucose-Capillary: 200 mg/dL — ABNORMAL HIGH (ref 70–99)

## 2020-05-28 LAB — TRIGLYCERIDES: Triglycerides: 104 mg/dL (ref <150)

## 2020-05-28 LAB — C-REACTIVE PROTEIN: CRP: <0.5 mg/dL (ref <1.0)

## 2020-05-28 LAB — PROTIME-INR
INR: 1.1 (ref 0.8–1.2)
Prothrombin Time: 14.2 seconds (ref 11.4–15.2)

## 2020-05-28 SURGERY — WOUND EXPLORATION
Anesthesia: General | Site: Neck

## 2020-05-28 MED ORDER — PROPOFOL 1000 MG/100ML IV EMUL
INTRAVENOUS | Status: DC | PRN
  Administered 2020-05-28: 20 ug/kg/min via INTRAVENOUS
  Administered 2020-05-28: 40 ug/kg/min via INTRAVENOUS

## 2020-05-28 MED ORDER — ACETAMINOPHEN 325 MG PO TABS: 650.0000 mg | ORAL_TABLET | ORAL | Status: DC | PRN

## 2020-05-28 MED ORDER — POLYVINYL ALCOHOL 1.4 % OP SOLN
1.0000 [drp] | Freq: Four times a day (QID) | OPHTHALMIC | Status: DC | PRN
  Administered 2020-05-30: 1 [drp] via OPHTHALMIC
  Filled 2020-05-28: qty 15

## 2020-05-28 MED ORDER — CEFAZOLIN SODIUM-DEXTROSE 2-4 GM/100ML-% IV SOLN
2.0000 g | Freq: Three times a day (TID) | INTRAVENOUS | Status: AC
  Administered 2020-05-29 (×2): 2 g via INTRAVENOUS
  Filled 2020-05-28 (×3): qty 100

## 2020-05-28 MED ORDER — ALBUTEROL SULFATE HFA 108 (90 BASE) MCG/ACT IN AERS
2.0000 | INHALATION_SPRAY | Freq: Four times a day (QID) | RESPIRATORY_TRACT | Status: DC | PRN
  Filled 2020-05-28: qty 6.7

## 2020-05-28 MED ORDER — CARBOXYMETHYLCELLUL-GLYCERIN 0.5-0.9 % OP SOLN: 1.0000 [drp] | Freq: Four times a day (QID) | OPHTHALMIC | Status: DC | PRN

## 2020-05-28 MED ORDER — METFORMIN HCL 500 MG PO TABS: 500.0000 mg | ORAL_TABLET | Freq: Two times a day (BID) | ORAL | Status: DC

## 2020-05-28 MED ORDER — ACETAMINOPHEN 650 MG RE SUPP: 650.0000 mg | RECTAL | Status: DC | PRN

## 2020-05-28 MED ORDER — FERROUS SULFATE 325 (65 FE) MG PO TABS
325.0000 mg | ORAL_TABLET | Freq: Every day | ORAL | Status: DC
  Filled 2020-05-28: qty 1

## 2020-05-28 MED ORDER — MIDAZOLAM HCL 2 MG/2ML IJ SOLN
INTRAMUSCULAR | Status: AC
  Filled 2020-05-28: qty 2

## 2020-05-28 MED ORDER — DEXAMETHASONE SODIUM PHOSPHATE 10 MG/ML IJ SOLN
INTRAMUSCULAR | Status: DC | PRN
  Administered 2020-05-28: 10 mg via INTRAVENOUS

## 2020-05-28 MED ORDER — PHENYLEPHRINE 40 MCG/ML (10ML) SYRINGE FOR IV PUSH (FOR BLOOD PRESSURE SUPPORT)
PREFILLED_SYRINGE | INTRAVENOUS | Status: DC | PRN
  Administered 2020-05-28: 80 ug via INTRAVENOUS
  Administered 2020-05-28 (×2): 40 ug via INTRAVENOUS

## 2020-05-28 MED ORDER — ROCURONIUM BROMIDE 50 MG/5ML IV SOLN
INTRAVENOUS | Status: DC | PRN
  Administered 2020-05-28: 100 mg via INTRAVENOUS

## 2020-05-28 MED ORDER — THROMBIN 5000 UNITS EX SOLR
OROMUCOSAL | Status: DC | PRN
  Administered 2020-05-28: 5 mL via TOPICAL

## 2020-05-28 MED ORDER — CEFAZOLIN SODIUM-DEXTROSE 2-3 GM-%(50ML) IV SOLR
INTRAVENOUS | Status: DC | PRN
  Administered 2020-05-28: 2 g via INTRAVENOUS

## 2020-05-28 MED ORDER — PHENOL 1.4 % MT LIQD
1.0000 | OROMUCOSAL | Status: DC | PRN
  Administered 2020-05-31: 1 via OROMUCOSAL
  Filled 2020-05-28 (×2): qty 177

## 2020-05-28 MED ORDER — ORAL CARE MOUTH RINSE
15.0000 mL | OROMUCOSAL | Status: DC
  Administered 2020-05-29 – 2020-05-30 (×18): 15 mL via OROMUCOSAL

## 2020-05-28 MED ORDER — HYDROCHLOROTHIAZIDE 12.5 MG PO CAPS
12.5000 mg | ORAL_CAPSULE | Freq: Every day | ORAL | Status: DC
  Filled 2020-05-28 (×2): qty 1

## 2020-05-28 MED ORDER — ARTIFICIAL TEARS OPHTHALMIC OINT
TOPICAL_OINTMENT | OPHTHALMIC | Status: DC | PRN
  Administered 2020-05-28: 1 via OPHTHALMIC

## 2020-05-28 MED ORDER — MENTHOL 3 MG MT LOZG
1.0000 | LOZENGE | OROMUCOSAL | Status: DC | PRN
  Filled 2020-05-28: qty 9

## 2020-05-28 MED ORDER — DEXAMETHASONE 4 MG PO TABS
4.0000 mg | ORAL_TABLET | Freq: Four times a day (QID) | ORAL | Status: DC
  Filled 2020-05-28 (×3): qty 1

## 2020-05-28 MED ORDER — ROSUVASTATIN CALCIUM 5 MG PO TABS: 10.0000 mg | ORAL_TABLET | Freq: Every day | ORAL | Status: DC

## 2020-05-28 MED ORDER — LORATADINE 10 MG PO TABS: 10.0000 mg | ORAL_TABLET | Freq: Every day | ORAL | Status: DC

## 2020-05-28 MED ORDER — THROMBIN 20000 UNITS EX SOLR
CUTANEOUS | Status: AC
  Filled 2020-05-28: qty 20000

## 2020-05-28 MED ORDER — PANTOPRAZOLE SODIUM 40 MG PO TBEC
40.0000 mg | DELAYED_RELEASE_TABLET | Freq: Every day | ORAL | Status: DC
Start: 2020-05-28 — End: 2020-05-28

## 2020-05-28 MED ORDER — NAPROXEN SODIUM 220 MG PO TABS: 220.0000 mg | ORAL_TABLET | Freq: Every day | ORAL | Status: DC | PRN

## 2020-05-28 MED ORDER — LISINOPRIL 2.5 MG PO TABS: 2.5000 mg | ORAL_TABLET | Freq: Every day | ORAL | Status: DC

## 2020-05-28 MED ORDER — MIDAZOLAM HCL 5 MG/5ML IJ SOLN
INTRAMUSCULAR | Status: DC | PRN
  Administered 2020-05-28: 2 mg via INTRAVENOUS

## 2020-05-28 MED ORDER — KCL IN DEXTROSE-NACL 20-5-0.45 MEQ/L-%-% IV SOLN
INTRAVENOUS | Status: DC
  Filled 2020-05-28: qty 1000

## 2020-05-28 MED ORDER — FENTANYL 2500MCG IN NS 250ML (10MCG/ML) PREMIX INFUSION: 0.0000 ug/h | INTRAVENOUS | Status: DC

## 2020-05-28 MED ORDER — PROPOFOL 1000 MG/100ML IV EMUL
5.0000 ug/kg/min | INTRAVENOUS | Status: DC
  Administered 2020-05-28 – 2020-05-29 (×5): 40 ug/kg/min via INTRAVENOUS
  Filled 2020-05-28 (×3): qty 100

## 2020-05-28 MED ORDER — PROPOFOL 500 MG/50ML IV EMUL
INTRAVENOUS | Status: DC | PRN
  Administered 2020-05-28: 40 ug/kg/min via INTRAVENOUS

## 2020-05-28 MED ORDER — 0.9 % SODIUM CHLORIDE (POUR BTL) OPTIME
TOPICAL | Status: DC | PRN
  Administered 2020-05-28: 1000 mL

## 2020-05-28 MED ORDER — DEXAMETHASONE SODIUM PHOSPHATE 4 MG/ML IJ SOLN
4.0000 mg | Freq: Four times a day (QID) | INTRAMUSCULAR | Status: DC
  Administered 2020-05-29: 4 mg via INTRAVENOUS
  Filled 2020-05-28: qty 1

## 2020-05-28 MED ORDER — PHENYLEPHRINE HCL-NACL 10-0.9 MG/250ML-% IV SOLN
INTRAVENOUS | Status: DC | PRN
  Administered 2020-05-28: 20 ug/min via INTRAVENOUS

## 2020-05-28 MED ORDER — LEVOTHYROXINE SODIUM 25 MCG PO TABS
125.0000 ug | ORAL_TABLET | Freq: Every day | ORAL | Status: DC
  Filled 2020-05-28: qty 1

## 2020-05-28 MED ORDER — HYDROMORPHONE HCL 1 MG/ML IJ SOLN
0.5000 mg | INTRAMUSCULAR | Status: DC | PRN
  Filled 2020-05-28: qty 0.5

## 2020-05-28 MED ORDER — PROPOFOL 10 MG/ML IV BOLUS
INTRAVENOUS | Status: DC | PRN
  Administered 2020-05-28: 50 mg via INTRAVENOUS

## 2020-05-28 MED ORDER — CHLORHEXIDINE GLUCONATE CLOTH 2 % EX PADS
6.0000 | MEDICATED_PAD | Freq: Every day | CUTANEOUS | Status: DC
  Administered 2020-05-28 – 2020-05-29 (×2): 6 via TOPICAL

## 2020-05-28 MED ORDER — LACTATED RINGERS IV SOLN: INTRAVENOUS | Status: DC | PRN

## 2020-05-28 MED ORDER — FENTANYL CITRATE (PF) 250 MCG/5ML IJ SOLN
INTRAMUSCULAR | Status: AC
  Filled 2020-05-28: qty 5

## 2020-05-28 MED ORDER — INSULIN ASPART 100 UNIT/ML ~~LOC~~ SOLN
2.0000 [IU] | SUBCUTANEOUS | Status: DC
  Administered 2020-05-29 (×3): 4 [IU] via SUBCUTANEOUS

## 2020-05-28 MED ORDER — PROPOFOL 10 MG/ML IV BOLUS
INTRAVENOUS | Status: AC
  Filled 2020-05-28: qty 20

## 2020-05-28 MED ORDER — PANTOPRAZOLE SODIUM 40 MG IV SOLR
40.0000 mg | Freq: Every day | INTRAVENOUS | Status: DC
  Administered 2020-05-28 – 2020-05-30 (×3): 40 mg via INTRAVENOUS
  Filled 2020-05-28 (×3): qty 40

## 2020-05-28 MED ORDER — ONDANSETRON HCL 4 MG/2ML IJ SOLN: 4.0000 mg | Freq: Four times a day (QID) | INTRAMUSCULAR | Status: DC | PRN

## 2020-05-28 MED ORDER — VITAMIN D 25 MCG (1000 UNIT) PO TABS
3000.0000 [IU] | ORAL_TABLET | ORAL | Status: DC
  Administered 2020-05-29: 3000 [IU] via ORAL
  Filled 2020-05-28 (×3): qty 3

## 2020-05-28 MED ORDER — SEMAGLUTIDE(0.25 OR 0.5MG/DOS) 2 MG/1.5ML ~~LOC~~ SOPN: 0.2500 mg | PEN_INJECTOR | SUBCUTANEOUS | Status: DC

## 2020-05-28 MED ORDER — CITALOPRAM HYDROBROMIDE 10 MG PO TABS
10.0000 mg | ORAL_TABLET | Freq: Every day | ORAL | Status: DC
  Filled 2020-05-28 (×2): qty 1

## 2020-05-28 MED ORDER — ETOMIDATE 2 MG/ML IV SOLN
INTRAVENOUS | Status: DC | PRN
  Administered 2020-05-28: 30 mg via INTRAVENOUS

## 2020-05-28 MED ORDER — SODIUM CHLORIDE 0.9% FLUSH: 3.0000 mL | INTRAVENOUS | Status: DC | PRN

## 2020-05-28 MED ORDER — ONDANSETRON HCL 4 MG PO TABS: 4.0000 mg | ORAL_TABLET | Freq: Four times a day (QID) | ORAL | Status: DC | PRN

## 2020-05-28 MED ORDER — SODIUM CHLORIDE 0.9% FLUSH
3.0000 mL | Freq: Two times a day (BID) | INTRAVENOUS | Status: DC
  Administered 2020-05-28 – 2020-05-31 (×6): 3 mL via INTRAVENOUS

## 2020-05-28 MED ORDER — CHLORHEXIDINE GLUCONATE 0.12% ORAL RINSE (MEDLINE KIT)
15.0000 mL | Freq: Two times a day (BID) | OROMUCOSAL | Status: DC
  Administered 2020-05-28 – 2020-05-31 (×6): 15 mL via OROMUCOSAL

## 2020-05-28 MED ORDER — CALCIUM CARBONATE ANTACID 500 MG PO CHEW
1.0000 | CHEWABLE_TABLET | ORAL | Status: DC | PRN
  Filled 2020-05-28: qty 1

## 2020-05-28 MED ORDER — ROCURONIUM BROMIDE 100 MG/10ML IV SOLN
INTRAVENOUS | Status: DC | PRN
  Administered 2020-05-28: 50 mg via INTRAVENOUS

## 2020-05-28 MED ORDER — FENTANYL CITRATE (PF) 250 MCG/5ML IJ SOLN
INTRAMUSCULAR | Status: DC | PRN
  Administered 2020-05-28 (×5): 50 ug via INTRAVENOUS

## 2020-05-28 MED ORDER — SODIUM CHLORIDE 0.9 % IV SOLN: 250.0000 mL | INTRAVENOUS | Status: DC

## 2020-05-28 MED FILL — CYCLOBENZAPRINE HCL 5 MG TA: 5 | 30 days supply | Qty: 90 | Fill #0

## 2020-05-28 MED FILL — HYDROCODON-APAP 5-325: 5-325 | 12 days supply | Qty: 90 | Fill #0

## 2020-05-28 SURGICAL SUPPLY — 55 items
BAND RUBBER #18 3X1/16 STRL (MISCELLANEOUS) ×4 IMPLANT
BASKET BONE COLLECTION (BASKET) IMPLANT
BIT DRILL NEURO 2X3.1 SFT TUCH (MISCELLANEOUS) ×1 IMPLANT
BLADE ULTRA TIP 2M (BLADE) IMPLANT
BNDG GAUZE ELAST 4 BULKY (GAUZE/BANDAGES/DRESSINGS) IMPLANT
BUR BARREL STRAIGHT FLUTE 4.0 (BURR) ×2 IMPLANT
CANISTER SUCT 3000ML PPV (MISCELLANEOUS) ×2 IMPLANT
CARTRIDGE OIL MAESTRO DRILL (MISCELLANEOUS) ×1 IMPLANT
COVER MAYO STAND STRL (DRAPES) ×2 IMPLANT
COVER WAND RF STERILE (DRAPES) ×2 IMPLANT
DECANTER SPIKE VIAL GLASS SM (MISCELLANEOUS) ×2 IMPLANT
DERMABOND ADVANCED (GAUZE/BANDAGES/DRESSINGS) ×1
DERMABOND ADVANCED .7 DNX12 (GAUZE/BANDAGES/DRESSINGS) ×1 IMPLANT
DIFFUSER DRILL AIR PNEUMATIC (MISCELLANEOUS) ×2 IMPLANT
DRAPE HALF SHEET 40X57 (DRAPES) IMPLANT
DRAPE LAPAROTOMY 100X72 PEDS (DRAPES) ×2 IMPLANT
DRAPE MICROSCOPE LEICA (MISCELLANEOUS) ×2 IMPLANT
DRILL NEURO 2X3.1 SOFT TOUCH (MISCELLANEOUS) ×2
DRSG OPSITE POSTOP 3X4 (GAUZE/BANDAGES/DRESSINGS) ×2 IMPLANT
DURAPREP 6ML APPLICATOR 50/CS (WOUND CARE) ×2 IMPLANT
ELECT COATED BLADE 2.86 ST (ELECTRODE) ×2 IMPLANT
ELECT REM PT RETURN 9FT ADLT (ELECTROSURGICAL) ×2
ELECTRODE REM PT RTRN 9FT ADLT (ELECTROSURGICAL) ×1 IMPLANT
GAUZE 4X4 16PLY RFD (DISPOSABLE) IMPLANT
GAUZE SPONGE 4X4 12PLY STRL (GAUZE/BANDAGES/DRESSINGS) IMPLANT
GLOVE BIO SURGEON STRL SZ8 (GLOVE) ×2 IMPLANT
GLOVE BIOGEL PI IND STRL 8 (GLOVE) ×1 IMPLANT
GLOVE BIOGEL PI IND STRL 8.5 (GLOVE) ×1 IMPLANT
GLOVE BIOGEL PI INDICATOR 8 (GLOVE) ×1
GLOVE BIOGEL PI INDICATOR 8.5 (GLOVE) ×1
GLOVE ECLIPSE 8.0 STRL XLNG CF (GLOVE) ×2 IMPLANT
GLOVE EXAM NITRILE XL STR (GLOVE) IMPLANT
GOWN STRL REUS W/ TWL LRG LVL3 (GOWN DISPOSABLE) IMPLANT
GOWN STRL REUS W/ TWL XL LVL3 (GOWN DISPOSABLE) IMPLANT
GOWN STRL REUS W/TWL 2XL LVL3 (GOWN DISPOSABLE) IMPLANT
GOWN STRL REUS W/TWL LRG LVL3 (GOWN DISPOSABLE)
GOWN STRL REUS W/TWL XL LVL3 (GOWN DISPOSABLE)
HALTER HD/CHIN CERV TRACTION D (MISCELLANEOUS) ×2 IMPLANT
HEMOSTAT POWDER KIT SURGIFOAM (HEMOSTASIS) ×2 IMPLANT
KIT BASIN OR (CUSTOM PROCEDURE TRAY) ×2 IMPLANT
KIT TURNOVER KIT B (KITS) ×2 IMPLANT
NEEDLE HYPO 25X1 1.5 SAFETY (NEEDLE) ×2 IMPLANT
NEEDLE SPNL 22GX3.5 QUINCKE BK (NEEDLE) ×2 IMPLANT
NS IRRIG 1000ML POUR BTL (IV SOLUTION) ×2 IMPLANT
OIL CARTRIDGE MAESTRO DRILL (MISCELLANEOUS) ×2
PACK LAMINECTOMY NEURO (CUSTOM PROCEDURE TRAY) ×2 IMPLANT
PAD ARMBOARD 7.5X6 YLW CONV (MISCELLANEOUS) ×6 IMPLANT
PIN DISTRACTION 14MM (PIN) ×4 IMPLANT
SPONGE INTESTINAL PEANUT (DISPOSABLE) ×2 IMPLANT
SPONGE SURGIFOAM ABS GEL SZ50 (HEMOSTASIS) IMPLANT
STAPLER SKIN PROX WIDE 3.9 (STAPLE) IMPLANT
SUT VIC AB 3-0 SH 8-18 (SUTURE) ×4 IMPLANT
TOWEL GREEN STERILE (TOWEL DISPOSABLE) ×2 IMPLANT
TOWEL GREEN STERILE FF (TOWEL DISPOSABLE) ×2 IMPLANT
WATER STERILE IRR 1000ML POUR (IV SOLUTION) ×2 IMPLANT

## 2020-05-28 NOTE — Transfer of Care (Signed)
Immediate Anesthesia Transfer of Care Note  Patient: [name]  Procedure(s) Performed: ANTERIOR CERVICAL WOUND EXPLORATION (N/A Neck)  Patient Location: ICU  Anesthesia Type:General  Level of Consciousness: sedated and Patient remains intubated per anesthesia plan  Airway & Oxygen Therapy: Patient remains intubated per anesthesia plan and Patient placed on Ventilator (see vital sign flow sheet for setting)  Post-op Assessment: Report given to RN and Post -op Vital signs reviewed and stable  Post vital signs: Reviewed and stable  Last Vitals:  Vitals Value Taken Time  BP 101/58 05/28/20 2045  Temp 36.6 C 05/28/20 2035  Pulse 87 05/28/20 2047  Resp 8 05/28/20 2047  SpO2 99 % 05/28/20 2047  Vitals shown include unvalidated device data.  Last Pain:  Vitals:   05/28/20 2035  TempSrc: Axillary         Complications: No complications documented.

## 2020-05-28 NOTE — Brief Op Note (Signed)
05/28/2020  8:04 PM  PATIENT:  [name]  64 y.o. female  PRE-OPERATIVE DIAGNOSIS:  Laryngospasm and Neck Hematoma   POST-OPERATIVE DIAGNOSIS:  Laryngospasm and Neck Hematoma  PROCEDURE:  Procedure(s): ANTERIOR CERVICAL WOUND EXPLORATION (N/A)  SURGEON:  Surgeon(s) and Role:    Erline Levine, MD - Primary  PHYSICIAN ASSISTANT: Glenford Peers, NP  ASSISTANTS: none   ANESTHESIA:   general  EBL:  100 mL   BLOOD ADMINISTERED:none  DRAINS: (10) Jackson-Pratt drain(s) with closed bulb suction in the prevertebral space   LOCAL MEDICATIONS USED:  NONE  SPECIMEN:  No Specimen  DISPOSITION OF SPECIMEN:  N/A  COUNTS:  YES  TOURNIQUET:  * No tourniquets in log *  DICTATION: Patient is 64 year old female who underwent uncomplicated ACDF C 67 today, who developed laryngospasm and respiratory distress requiring bag ventilation.  She was intubated by Anesthesia in ER and taken to OR for wound exploration.     PROCEDURE: Patient was brought to operating room and her head was placed on a horseshoe head holder she was placed in 5 pounds of Holter traction and his anterior neck was prepped and draped in usual sterile fashion. Her previous incision was opened and carried to the deep soft tissues.  These were explored and blood and clot was removed.  The wound was explored for active bleeding.  None was found.  Hardware was inspected and found to be in good position without complicating features.  Surgifoam and copious irrigation was used and the wound appeared dry with minimal bleeding.  A #10 JP drain was inserted through a separate stab incision and the drain was left in place.  The platysma layer was closed with 3-0 Vicryl stitches and the skin was reapproximated with 3-0 Vicryl subcuticular stitches. The wound was dressed with Dermabond. Counts were correct at the end of the case. Patient was kept intubated and taken to recovery in stable and satisfactory condition.  Plan was to keep the  patient intubated overnight with CCM service assistance.   PLAN OF CARE: Admit to inpatient   PATIENT DISPOSITION:  PACU - hemodynamically stable.   Delay start of Pharmacological VTE agent (>24hrs) due to surgical blood loss or risk of bleeding: yes

## 2020-05-28 NOTE — ED Provider Notes (Signed)
[name] Provider Note   CSN: [id number] Arrival date & time: 05/28/20  1823     History Chief Complaint  Patient presents with  . Respiratory Distress    [name] is a 64 y.o. female hx of OSA, cervical fusion, HL, HTN, here presenting with respiratory distress.  Patient just had a cervical fusion done earlier and was extubated around 3 PM.  Patient was in the outpatient surgical center and began to have worsening shortness of breath.  Patient was thought to have laryngospasm and became progressively harder to back.  EMS was called to transfer patient to the ER for intubation.  The history is provided by the EMS personnel.       Past Medical History:  Diagnosis Date  . Acute medial meniscal tear    Right knee  . Allergy   . Anxiety   . Aortic insufficiency    Mild  . Aortic regurgitation   . Arthritis   . Carpal tunnel syndrome   . Chronic kidney disease    one kidney   . Dysrhythmia    pvc's at times  . GERD (gastroesophageal reflux disease)   . Hyperlipidemia   . Hypothyroidism   . Migraine headache   . Mitral regurgitation   . Mitral valve prolapse   . Nausea alone   . Need for SBE (subacute bacterial endocarditis) prophylaxis   . Pilonidal cyst without mention of abscess   . Pneumonia Aug 14 2012  . PONV (postoperative nausea and vomiting)    states she vasovagals  . Sleep apnea    wears CPAP  . Thyroid disease   . Transplanted kidney removed    donated kidney    Patient Active Problem List   Diagnosis Date Noted  . Iron deficiency 04/17/2020  . Right arm pain 04/16/2020  . Right arm weakness 04/16/2020  . Degenerative disc disease, cervical 04/11/2020  . Seborrheic keratoses 01/05/2020  . Papules 01/05/2020  . Class 2 obesity due to excess calories without serious comorbidity with body mass index (BMI) of 38.0 to 38.9 in adult 01/05/2020  . Elevated lipase 06/12/2019  . Pain radiating to back  05/23/2019  . BPPV (benign paroxysmal positional vertigo), left 05/22/2019  . CKD (chronic kidney disease) stage 3, GFR 30-59 ml/min (HCC) 03/10/2019  . Fracture of triquetral bone of wrist 02/21/2018  . BPPV (benign paroxysmal positional vertigo), right 02/20/2018  . Concussion wth loss of consciousness of 30 minutes or less 02/20/2018  . Fall 02/20/2018  . Other meniscus derangements, posterior horn of medial meniscus, right knee 11/24/2017  . Pain of right thumb 11/24/2017  . Trigger thumb, right thumb 11/24/2017  . Pre-diabetes 10/19/2017  . Pain in thumb joint with movement of right hand 10/19/2017  . Class 2 obesity due to excess calories without serious comorbidity with body mass index (BMI) of 35.0 to 35.9 in adult 10/19/2017  . Unilateral primary osteoarthritis, right knee 09/27/2017  . Chronic pain of right knee 09/13/2017  . Hypertriglyceridemia 07/14/2017  . Elevated fasting glucose 07/14/2017  . Status post carpal tunnel release 03/11/2017  . Carpal tunnel syndrome, left upper limb 02/25/2017  . Daytime sleepiness 01/24/2017  . OSA (obstructive sleep apnea) 12/23/2015  . Hyperlipidemia 11/22/2014  . Single kidney 11/22/2014  . Hypothyroidism 11/22/2014  . Anxiety and depression 06/03/2014  . Primary osteoarthritis involving multiple joints 06/03/2014  . Nausea alone 11/18/2013  . Abnormal transaminases 11/18/2013  . Abdominal pain, chronic, right upper quadrant  11/16/2013  . Candidiasis of female genitalia 11/16/2013  . Elevated liver enzymes 11/16/2013  . Folliculitis 71/01/2693  . Arthritis of left knee 09/14/2013  . Status post total knee replacement 09/14/2013  . Cough 08/15/2013  . Obesity (BMI 30-39.9) 07/17/2013  . Essential hypertension, benign 04/29/2013  . Wheezing 04/29/2013  . Insomnia 04/29/2013  . Unspecified vitamin D deficiency 04/29/2013  . Migraine headache 04/29/2013  . Anxiety state 04/29/2013  . Unspecified hypothyroidism 04/29/2013  . UTI  (urinary tract infection) 07/14/2012  . Other and unspecified hyperlipidemia 07/14/2012  . MVP (mitral valve prolapse) 07/14/2012  . GERD (gastroesophageal reflux disease) 07/14/2012  . Allergic rhinitis 07/14/2012  . Mitral regurgitation 07/14/2012  . S/P hysterectomy 07/14/2012    Past Surgical History:  Procedure Laterality Date  . ABDOMINAL HYSTERECTOMY    . CARPAL TUNNEL RELEASE Right   . CARPAL TUNNEL RELEASE Left 02/25/2017   Procedure: LEFT OPEN CARPAL TUNNEL RELEASE;  Surgeon: [name], MD;  Location: [facility];  Service: Orthopedics;  Laterality: Left;  . COLONOSCOPY    . KNEE ARTHROSCOPY Right 01/06/2018   Procedure: RIGHT KNEE ARTHROSCOPY WITH PARTIAL  MEDIAL MENISCECTOMY;  Surgeon: [name], MD;  Location: [facility];  Service: Orthopedics;  Laterality: Right;  . KNEE SURGERY    . NEPHRECTOMY    . PARTIAL KNEE ARTHROPLASTY Right 03/31/2018   Procedure: RIGHT KNEE UNI ARTHROPLASTY;  Surgeon: Marchia Bond, MD;  Location: [facility];  Service: Orthopedics;  Laterality: Right;  . TONSILLECTOMY    . TOTAL KNEE ARTHROPLASTY Left 09/14/2013   Procedure: LEFT TOTAL KNEE ARTHROPLASTY;  Surgeon: [name], MD;  Location: [place];  Service: Orthopedics;  Laterality: Left;  . TUBAL LIGATION    . WISDOM TOOTH EXTRACTION       OB History   No obstetric history on file.     Family History  Problem Relation Age of Onset  . Diabetes Mother   . Hypertension Mother   . Heart disease Father   . Hypertension Father   . Thyroid disease Father   . Cancer Maternal Aunt   . Cancer Maternal Aunt   . Colon cancer Neg Hx   . Esophageal cancer Neg Hx   . Rectal cancer Neg Hx   . Stomach cancer Neg Hx     Social History   Tobacco Use  . Smoking status: Former Smoker    Quit date: 11/22/1983    Years since quitting: 36.5  . Smokeless tobacco: Never Used  Vaping Use  . Vaping Use: Never used  Substance Use Topics  . Alcohol use: Yes      Comment: occasional  . Drug use: No    Home Medications Prior to Admission medications   Medication Sig Start Date End Date Taking? Authorizing Provider  albuterol (PROVENTIL HFA;VENTOLIN HFA) 108 (90 Base) MCG/ACT inhaler Inhale 2 puffs into the lungs every 6 (six) hours as needed for wheezing or shortness of breath. 03/09/18   Breeback, Jade L, PA-C  calcium carbonate (TUMS - DOSED IN MG ELEMENTAL CALCIUM) 500 MG chewable tablet Chew 1 tablet by mouth as needed for indigestion or heartburn.    [provider]  Carboxymethylcellul-Glycerin (LUBRICATING EYE DROPS OP) Apply 1 drop to eye daily as needed (dry eyes).    [provider]  cetirizine (ZYRTEC) 10 MG tablet Take 1 tablet (10 mg total) by mouth at bedtime. 01/25/14 04/04/20  Jonathon Resides, MD  Cholecalciferol (VITAMIN D3) 3000 units TABS Take  3,000 Units by mouth 2 (two) times a week. Wednesdays & Saturdays.    [provider]  citalopram (CELEXA) 20 MG tablet Take 0.5 tablets (10 mg total) by mouth daily. 01/05/20   Breeback, Jade L, PA-C  Cyanocobalamin (B-12 PO) Take by mouth daily.    [provider]  esomeprazole (NEXIUM) 40 MG capsule TAKE 1 CAPSULE (40 MG TOTAL) BY MOUTH DAILY BEFORE BREAKFAST. 01/22/20   Breeback, Royetta Car, PA-C  ferrous sulfate 325 (65 FE) MG EC tablet Take 1 tablet (325 mg total) by mouth daily with breakfast. 04/17/20   Breeback, Jade L, PA-C  hydrochlorothiazide (MICROZIDE) 12.5 MG capsule Take 1 capsule (12.5 mg total) by mouth daily. 01/05/20   Breeback, Royetta Car, PA-C  levothyroxine (SYNTHROID) 125 MCG tablet TAKE 1 TABLET BY MOUTH ONCE DAILY 01/05/20   Breeback, Jade L, PA-C  lisinopril (ZESTRIL) 2.5 MG tablet Take 1 tablet (2.5 mg total) by mouth daily. 01/05/20   Breeback, Royetta Car, PA-C  metFORMIN (GLUCOPHAGE) 500 MG tablet Take 1 tablet (500 mg total) by mouth 2 (two) times daily with a meal. 04/17/20   Breeback, Jade L, PA-C  naproxen sodium (ANAPROX) 220 MG tablet Take 220-440  mg by mouth daily as needed (pain).    [provider]  predniSONE (DELTASONE) 50 MG tablet One tab PO daily for 5 days. 04/11/20   Breeback, [name] L, PA-C  Pyridoxine HCl (B-6 PO) Take by mouth daily.    [provider]  rosuvastatin (CRESTOR) 10 MG tablet Take 1 tablet (10 mg total) by mouth daily. 01/05/20   Breeback, Jade L, PA-C  Semaglutide,0.25 or 0.5MG /DOS, (OZEMPIC, 0.25 OR 0.5 MG/DOSE,) 2 MG/1.5ML SOPN Inject 0.25 mg into the skin once a week. 04/22/20   Donella Stade, PA-C    Allergies    Percocet [oxycodone-acetaminophen], Hydrocodone-acetaminophen, Lipitor [atorvastatin], Saxenda [liraglutide -weight management], Shrimp [shellfish allergy], Belviq xr [lorcaserin hcl er], and Morphine and related  Review of Systems   Review of Systems  Unable to perform ROS: Patient unresponsive  All other systems reviewed and are negative.   Physical Exam Updated Vital Signs BP (!) 150/78   Pulse (!) 121   Resp 18   SpO2 99%   Physical Exam Vitals and nursing note reviewed.  Constitutional:      Comments: Cyanotic, altered   HENT:     Mouth/Throat:     Mouth: Mucous membranes are dry.  Eyes:     Pupils: Pupils are equal, round, and reactive to light.  Neck:     Comments: Anterior cervical fusion scar Cardiovascular:     Rate and Rhythm: Normal rate.     Pulses: Normal pulses.  Pulmonary:     Comments: No spontaneous breath sounds Abdominal:     General: Abdomen is flat.     Palpations: Abdomen is soft.  Musculoskeletal:        General: Normal range of motion.  Skin:    General: Skin is warm.     Capillary Refill: Capillary refill takes less than 2 seconds.  Neurological:     Comments: Lethargic   Psychiatric:     Comments: Unable      ED Results / Procedures / Treatments   Labs (all labs ordered are listed, but only abnormal results are displayed) Labs Reviewed  I-STAT CHEM 8, ED - Abnormal; Notable for the following components:      Result Value    Glucose, Bld 173 (*)    Hemoglobin 18.0 (*)  HCT 53.0 (*)    All other components within normal limits  CBC WITH DIFFERENTIAL/PLATELET  BASIC METABOLIC PANEL  I-STAT CHEM 8, ED    EKG None  Radiology No results found.  Procedures Procedures (including critical care time) CRITICAL CARE Performed by: Wandra Arthurs   Total critical care time: 40 minutes  Critical care time was exclusive of separately billable procedures and treating other patients.  Critical care was necessary to treat or prevent imminent or life-threatening deterioration.  Critical care was time spent personally by me on the following activities: development of treatment plan with patient and/or surrogate as well as nursing, discussions with consultants, evaluation of patient's response to treatment, examination of patient, obtaining history from patient or surrogate, ordering and performing treatments and interventions, ordering and review of laboratory studies, ordering and review of radiographic studies, pulse oximetry and re-evaluation of patient's condition.    Medications Ordered in ED Medications  etomidate (AMIDATE) injection (30 mg Intravenous Given 05/28/20 1826)  rocuronium (ZEMURON) injection (100 mg Intravenous Given 05/28/20 1826)  propofol (DIPRIVAN) 1000 MG/100ML infusion (40 mcg/kg/min  104 kg (Order-Specific) Intravenous New Bag/Given 05/28/20 1839)  propofol (DIPRIVAN) 10 mg/mL bolus/IV push (50 mg Intravenous Given 05/28/20 1834)    ED Course  I have reviewed the triage vital signs and the nursing notes.  Pertinent labs & imaging results that were available during my care of the patient were reviewed by me and considered in my medical decision making (see chart for details).    MDM Rules/Calculators/A&P                         [name] is a 64 y.o. female here presenting with laryngospasm.  Patient has a very large neck and initially attempted to intubate patient without any  success.  Patient initially was cyanotic with oxygen saturation of 20%.  After bagging and OPA, patient was maintaining oxygen saturation around 98%.  Consulted anesthesia (Dr. Glennon Mac) who came and intubated the patient.  The neurosurgeon, Dr. Vertell Limber is also at bedside.  Patient will go up to the OR emergently for a neck exploration.    Final Clinical Impression(s) / ED Diagnoses Final diagnoses:  Laryngospasm    Rx / DC Orders ED Discharge Orders    None       Drenda Freeze, MD 05/28/20 1944

## 2020-05-28 NOTE — Op Note (Signed)
05/28/2020  8:04 PM  PATIENT:  [name]  64 y.o. female  PRE-OPERATIVE DIAGNOSIS:  Laryngospasm and Neck Hematoma   POST-OPERATIVE DIAGNOSIS:  Laryngospasm and Neck Hematoma  PROCEDURE:  Procedure(s): ANTERIOR CERVICAL WOUND EXPLORATION (N/A)  SURGEON:  Surgeon(s) and Role:    Erline Levine, MD - Primary  PHYSICIAN ASSISTANT: Glenford Peers, NP  ASSISTANTS: none   ANESTHESIA:   general  EBL:  100 mL   BLOOD ADMINISTERED:none  DRAINS: (10) Jackson-Pratt drain(s) with closed bulb suction in the prevertebral space   LOCAL MEDICATIONS USED:  NONE  SPECIMEN:  No Specimen  DISPOSITION OF SPECIMEN:  N/A  COUNTS:  YES  TOURNIQUET:  * No tourniquets in log *  DICTATION: Patient is 64 year old female who underwent uncomplicated ACDF C 67 today, who developed laryngospasm and respiratory distress requiring bag ventilation.  She was intubated by Anesthesia in ER and taken to OR for wound exploration.     PROCEDURE: Patient was brought to operating room and her head was placed on a horseshoe head holder she was placed in 5 pounds of Holter traction and his anterior neck was prepped and draped in usual sterile fashion. Her previous incision was opened and carried to the deep soft tissues.  These were explored and blood and clot was removed.  The wound was explored for active bleeding.  None was found.  Hardware was inspected and found to be in good position without complicating features.  Surgifoam and copious irrigation was used and the wound appeared dry with minimal bleeding.  A #10 JP drain was inserted through a separate stab incision and the drain was left in place.  The platysma layer was closed with 3-0 Vicryl stitches and the skin was reapproximated with 3-0 Vicryl subcuticular stitches. The wound was dressed with Dermabond. Counts were correct at the end of the case. Patient was kept intubated and taken to recovery in stable and satisfactory condition.  Plan was to keep the  patient intubated overnight with CCM service assistance.   PLAN OF CARE: Admit to inpatient   PATIENT DISPOSITION:  PACU - hemodynamically stable.   Delay start of Pharmacological VTE agent (>24hrs) due to surgical blood loss or risk of bleeding: yes

## 2020-05-28 NOTE — Consult Note (Signed)
NAME:  [name], MRN:  [id number], DOB:  [birth year], LOS: 0 ADMISSION DATE:  05/28/2020, CONSULTATION DATE:  10/19 REFERRING MD:  Erline Levine  CHIEF COMPLAINT:  S/p cervical fusion   Brief History   63 yo female with PMH significant for OSA, hypertension admitted to ICU s/p anterior approach of cervical discectomy and fusion of c6/c7 with post op re-intubation due to laryngospasm and neck hematoma requiring cervical wound exploration.    History of present illness   Patient underwent c6/c7 discectomy and fusion with anterior approach.  She was extubated post procedure and was being monitored in PACU when she developed respiratory distress in setting of laryngospasm.  BVM was started immediately and patient transported to [facility].  She was intubated in the emergency department by anesthesia who noted her to have significant edema.  NSGY consulted. Patient was taken to OR for wound exploration, she was found to have hematoma which was drained in OR and JP drain placed. Post op she returns to ICU, intubated and sedated with recent chemical paralytic.   Past Medical History  OSA Hypertension   Significant Hospital Events   10/19 OR for ACDF C6/7 10/19 Intubated 10/19 OR for anterior cervical wound exploration with JP drain placement  Consults:  10/19 NSGY 10/19 PCCM  Procedures:  10/19: ACDF C6/7 10/19 Re-intubation for laryngospasm and neck hematoma 10/19 OR for anterior cervical wound exploration with JP drain placement  Significant Diagnostic Tests:    Micro Data:    Antimicrobials:    Interim history/subjective:  New admit, see above.   Objective   Blood pressure (!) 150/78, pulse (!) 121, resp. rate 18, SpO2 99 %.        Intake/Output Summary (Last 24 hours) at 05/28/2020 2034 Last data filed at 05/28/2020 1949 Gross per 24 hour  Intake 1000 ml  Output 100 ml  Net 900 ml   There were no vitals filed for this visit.   Examination: General: no  acute distress, sedated on vent HEENT: NCAT,  PERRLA, MMMP, swelling on R side of neck with bandage in place as well as drain Lungs: CTA, no wheezes, rhonchi, rales Cardiovascular: RRR, no m/g/r Abdomen: obese, soft, NT,ND, BS+ Extremities: no c/c, trace edema Skin: no rashes, warm and dry Neuro: sedated post operative GU: deferred  Resolved Hospital Problem list   Neck hematoma  Assessment & Plan:  Acute hypoxic resp failure Severe laryngeal edema -titrate vent -cont decadron 27m q6 -d/c acei out of overabundance of caution -send c4 complement, c1 esterase, crp,esr and tryptase for completion.  -suspect would benefit from ent upper airway eval prior to extubation in light of challenges with intubation and degree of reported edema.   T2dm, controlled -ssi -holding metformin.   Cervical hematoma:  -s/p ACDF C  -per neurosx -avoid chemoprophy until ok with them.  -check coags -follow hgb  Htn:  -holding ace -otherwise cont meds  Hypothyroid:  -cont levothyroxine    Best practice:  Diet: npo. Start tf if unable to be extubated tomorrow Pain/Anxiety/Delirium protocol (if indicated): per protocol VAP protocol (if indicated): protocol DVT prophylaxis: holding with recent hematoma GI prophylaxis: ppi Glucose control: monitoring Mobility: bedrest Code Status: full Family Communication: at bedside Disposition: ICU... ett overnight with severe airway edema. Suspect warrants ent eval prior to extubation.   Labs   CBC: Recent Labs  Lab 05/28/20 1838 05/28/20 1859  WBC  --  24.3*  NEUTROABS  --  19.8*  HGB 18.0* 16.1*  HCT  53.0* 53.6*  MCV  --  80.1  PLT  --  676*    Basic Metabolic Panel: Recent Labs  Lab 05/28/20 1838 05/28/20 1859  NA 139 136  K 3.6 3.5  CL 100 98  CO2  --  27  GLUCOSE 173* 172*  BUN 14 13  CREATININE 1.00 1.20*  CALCIUM  --  9.9   GFR: Estimated Creatinine Clearance: 57.3 mL/min (A) (by C-G formula based on SCr of 1.2 mg/dL  (H)). Recent Labs  Lab 05/28/20 1859  WBC 24.3*    Liver Function Tests: No results for input(s): AST, ALT, ALKPHOS, BILITOT, PROT, ALBUMIN in the last 168 hours. No results for input(s): LIPASE, AMYLASE in the last 168 hours. No results for input(s): AMMONIA in the last 168 hours.  ABG    Component Value Date/Time   TCO2 28 05/28/2020 1838     Coagulation Profile: No results for input(s): INR, PROTIME in the last 168 hours.  Cardiac Enzymes: No results for input(s): CKTOTAL, CKMB, CKMBINDEX, TROPONINI in the last 168 hours.  HbA1C: Hgb A1c MFr Bld  Date/Time Value Ref Range Status  04/11/2020 09:58 AM 6.1 (H) <5.7 % of total Hgb Final    Comment:    For someone without known diabetes, a hemoglobin  A1c value between 5.7% and 6.4% is consistent with prediabetes and should be confirmed with a  follow-up test. . For someone with known diabetes, a value <7% indicates that their diabetes is well controlled. A1c targets should be individualized based on duration of diabetes, age, comorbid conditions, and other considerations. . This assay result is consistent with an increased risk of diabetes. . Currently, no consensus exists regarding use of hemoglobin A1c for diagnosis of diabetes for children. .   07/12/2017 09:20 AM 5.7 (H) <5.7 % of total Hgb Final    Comment:    For someone without known diabetes, a hemoglobin  A1c value between 5.7% and 6.4% is consistent with prediabetes and should be confirmed with a  follow-up test. . For someone with known diabetes, a value <7% indicates that their diabetes is well controlled. A1c targets should be individualized based on duration of diabetes, age, comorbid conditions, and other considerations. . This assay result is consistent with an increased risk of diabetes. . Currently, no consensus exists regarding use of hemoglobin A1c for diagnosis of diabetes for children. .     CBG: Recent Labs  Lab  05/28/20 2032  GLUCAP 200*    Review of Systems:   Unobtainable 2/2 intubation and sedation  Past Medical History  She,  has a past medical history of Acute medial meniscal tear, Allergy, Anxiety, Aortic insufficiency, Aortic regurgitation, Arthritis, Carpal tunnel syndrome, Chronic kidney disease, Dysrhythmia, GERD (gastroesophageal reflux disease), Hyperlipidemia, Hypothyroidism, Migraine headache, Mitral regurgitation, Mitral valve prolapse, Nausea alone, Need for SBE (subacute bacterial endocarditis) prophylaxis, Pilonidal cyst without mention of abscess, Pneumonia (Aug 14 2012), PONV (postoperative nausea and vomiting), Sleep apnea, Thyroid disease, and Transplanted kidney removed.   Surgical History    Past Surgical History:  Procedure Laterality Date  . ABDOMINAL HYSTERECTOMY    . CARPAL TUNNEL RELEASE Right   . CARPAL TUNNEL RELEASE Left 02/25/2017   Procedure: LEFT OPEN CARPAL TUNNEL RELEASE;  Surgeon: [name], MD;  Location: [place];  Service: Orthopedics;  Laterality: Left;  . COLONOSCOPY    . KNEE ARTHROSCOPY Right 01/06/2018   Procedure: RIGHT KNEE ARTHROSCOPY WITH PARTIAL  MEDIAL MENISCECTOMY;  Surgeon: [name], MD;  Location: [place];  Service: Orthopedics;  Laterality: Right;  . KNEE SURGERY    . NEPHRECTOMY    . PARTIAL KNEE ARTHROPLASTY Right 03/31/2018   Procedure: RIGHT KNEE UNI ARTHROPLASTY;  Surgeon: Marchia Bond, MD;  Location: [place];  Service: Orthopedics;  Laterality: Right;  . TONSILLECTOMY    . TOTAL KNEE ARTHROPLASTY Left 09/14/2013   Procedure: LEFT TOTAL KNEE ARTHROPLASTY;  Surgeon: [name], MD;  Location: [place];  Service: Orthopedics;  Laterality: Left;  . TUBAL LIGATION    . WISDOM TOOTH EXTRACTION       Social History   reports that she quit smoking about 36 years ago. She has never used smokeless tobacco. She reports current alcohol use. She reports that she does not use drugs.   Family  History   Her family history includes Cancer in her maternal aunt and maternal aunt; Diabetes in her mother; Heart disease in her father; Hypertension in her father and mother; Thyroid disease in her father. There is no history of Colon cancer, Esophageal cancer, Rectal cancer, or Stomach cancer.   Allergies Allergies  Allergen Reactions  . Percocet [Oxycodone-Acetaminophen] Other (See Comments)    SYNCOPE TACHYCARDIA  . Hydrocodone-Acetaminophen Nausea And Vomiting and Other (See Comments)    dizziness  . Lipitor [Atorvastatin] Other (See Comments)    Ear pain/fullness in ear "as if I were under water"  . Saxenda [Liraglutide -Weight Management] Other (See Comments)    Hives/itching/site reaction  . Shrimp [Shellfish Allergy] Itching, Nausea And Vomiting and Swelling    SWELLING REACTION UNSPECIFIED   . Belviq Xr [Lorcaserin Hcl Er] Other (See Comments)    Gained weight on eat.   . Morphine And Related Nausea And Vomiting     Home Medications  Prior to Admission medications   Medication Sig Start Date End Date Taking? Authorizing Provider  albuterol (PROVENTIL HFA;VENTOLIN HFA) 108 (90 Base) MCG/ACT inhaler Inhale 2 puffs into the lungs every 6 (six) hours as needed for wheezing or shortness of breath. 03/09/18   Breeback, Jade L, PA-C  calcium carbonate (TUMS - DOSED IN MG ELEMENTAL CALCIUM) 500 MG chewable tablet Chew 1 tablet by mouth as needed for indigestion or heartburn.    [provider]  Carboxymethylcellul-Glycerin (LUBRICATING EYE DROPS OP) Apply 1 drop to eye daily as needed (dry eyes).    [provider]  cetirizine (ZYRTEC) 10 MG tablet Take 1 tablet (10 mg total) by mouth at bedtime. 01/25/14 04/04/20  Jonathon Resides, MD  Cholecalciferol (VITAMIN D3) 3000 units TABS Take 3,000 Units by mouth 2 (two) times a week. Wednesdays & Saturdays.    [provider]  citalopram (CELEXA) 20 MG tablet Take 0.5 tablets (10 mg total) by mouth daily. 01/05/20    Breeback, Jade L, PA-C  Cyanocobalamin (B-12 PO) Take by mouth daily.    [provider]  esomeprazole (NEXIUM) 40 MG capsule TAKE 1 CAPSULE (40 MG TOTAL) BY MOUTH DAILY BEFORE BREAKFAST. 01/22/20   Breeback, Royetta Car, PA-C  ferrous sulfate 325 (65 FE) MG EC tablet Take 1 tablet (325 mg total) by mouth daily with breakfast. 04/17/20   Breeback, Jade L, PA-C  hydrochlorothiazide (MICROZIDE) 12.5 MG capsule Take 1 capsule (12.5 mg total) by mouth daily. 01/05/20   Breeback, Royetta Car, PA-C  levothyroxine (SYNTHROID) 125 MCG tablet TAKE 1 TABLET BY MOUTH ONCE DAILY 01/05/20   Breeback, Jade L, PA-C  lisinopril (ZESTRIL) 2.5 MG tablet Take 1 tablet (2.5 mg total)  by mouth daily. 01/05/20   Breeback, Royetta Car, PA-C  metFORMIN (GLUCOPHAGE) 500 MG tablet Take 1 tablet (500 mg total) by mouth 2 (two) times daily with a meal. 04/17/20   Breeback, Jade L, PA-C  naproxen sodium (ANAPROX) 220 MG tablet Take 220-440 mg by mouth daily as needed (pain).    [provider]  predniSONE (DELTASONE) 50 MG tablet One tab PO daily for 5 days. 04/11/20   Breeback, [name] L, PA-C  Pyridoxine HCl (B-6 PO) Take by mouth daily.    [provider]  rosuvastatin (CRESTOR) 10 MG tablet Take 1 tablet (10 mg total) by mouth daily. 01/05/20   Breeback, Jade L, PA-C  Semaglutide,0.25 or 0.5MG/DOS, (OZEMPIC, 0.25 OR 0.5 MG/DOSE,) 2 MG/1.5ML SOPN Inject 0.25 mg into the skin once a week. 04/22/20   Donella Stade, PA-C     Critical care time: The patient is critically ill with multiple organ systems failure and requires high complexity decision making for assessment and support, frequent evaluation and titration of therapies, application of advanced monitoring technologies and extensive interpretation of multiple databases.  Critical care time 38 mins. This represents my time independent of the NP's/PA's/med students/residents time taking care of the pt. This is excluding procedures.     Frank  Pulmonary and Critical Care 05/28/2020, 9:02 PM

## 2020-05-28 NOTE — Anesthesia Preprocedure Evaluation (Addendum)
Anesthesia Evaluation  Patient identified by MRN, date of birth, ID band Patient unresponsive    Reviewed: Allergy & Precautions, NPO status , Patient's Chart, lab work & pertinent test resultsPreop documentation limited or incomplete due to emergent nature of procedure.  History of Anesthesia Complications (+) PONV  Airway Mallampati: IV  TM Distance: <3 FB Neck ROM: Limited  Mouth opening: Limited Mouth Opening  Dental  (+) Missing   Pulmonary sleep apnea and Continuous Positive Airway Pressure Ventilation , former smoker,   BVM ventilation  breath sounds clear to auscultation  unstable     Cardiovascular (-) anginanegative cardio ROS   Rhythm:Regular Rate:Tachycardia  04/16/2020 ECHO: EF 60-65%, trivial MR, trivial AI   Neuro/Psych  Headaches, Anxiety Depression Acute neck edema post op ACDF Ankylosing spondylosis    GI/Hepatic Neg liver ROS, GERD  Controlled,  Endo/Other  Hypothyroidism Morbid obesity  Renal/GU Only one kidney: donated the other to fam member     Musculoskeletal  (+) Arthritis ,   Abdominal (+) + obese,   Peds  Hematology   Anesthesia Other Findings   Reproductive/Obstetrics                             Anesthesia Physical Anesthesia Plan  ASA: IV and emergent  Anesthesia Plan: General   Post-op Pain Management:    Induction: Intravenous  PONV Risk Score and Plan: 4 or greater and Ondansetron and Dexamethasone  Airway Management Planned: Oral ETT and Video Laryngoscope Planned  Additional Equipment: None  Intra-op Plan:   Post-operative Plan: Post-operative intubation/ventilation  Informed Consent: I have reviewed the patients History and Physical, chart, labs and discussed the procedure including the risks, benefits and alternatives for the proposed anesthesia with the patient or authorized representative who has indicated his/her understanding and  acceptance.     Consent reviewed with POA and Only emergency history available  Plan Discussed with: Surgeon and CRNA  Anesthesia Plan Comments: (Called emergently to ED for intubation of pt post op ACDF with neck swelling. Arrived to find pt with OA in, BVM ventilation, O2 sat 99%, BP 227/128, HR 110, pt sedated with propofol, relaxed with rocuronium)        Anesthesia Quick Evaluation

## 2020-05-28 NOTE — ED Triage Notes (Addendum)
Pt presents to ED via EMS from surgery center in respiratory distress after laryngospasm after extubation. PT being bagged with BVM. Face cyanotic on arrival

## 2020-05-28 NOTE — Anesthesia Postprocedure Evaluation (Signed)
Anesthesia Post Note  Patient: [name]  Procedure(s) Performed: ANTERIOR CERVICAL WOUND EXPLORATION (N/A Neck)     Patient location during evaluation: ICU Anesthesia Type: General Level of consciousness: sedated and patient remains intubated per anesthesia plan Pain management: pain level controlled Vital Signs Assessment: post-procedure vital signs reviewed and stable Respiratory status: patient on ventilator - see flowsheet for VS and patient remains intubated per anesthesia plan Cardiovascular status: blood pressure returned to baseline and stable Postop Assessment: no apparent nausea or vomiting Anesthetic complications: no   No complications documented.  Last Vitals:  Vitals:   05/28/20 1850 05/28/20 2035  BP:    Pulse: (!) 121   Resp: 18   Temp:  36.6 C  SpO2: 99%     Last Pain:  Vitals:   05/28/20 2035  TempSrc: Axillary                 Mauri Temkin,E. Matin Mattioli

## 2020-05-28 NOTE — Anesthesia Procedure Notes (Signed)
Procedure Name: Intubation Date/Time: 05/28/2020 6:45 PM Performed by: Annye Asa, MD Pre-anesthesia Checklist: Patient identified, Emergency Drugs available, Suction available and Patient being monitored Patient Re-evaluated:Patient Re-evaluated prior to induction Oxygen Delivery Method: Ambu bag Preoxygenation: Pre-oxygenation with 100% oxygen Induction Type: IV induction Ventilation: Mask ventilation with difficulty, Two handed mask ventilation required and Oral airway inserted - appropriate to patient size Laryngoscope Size: Glidescope and 4 Grade View: Grade III Tube type: Oral Tube size: 7.0 mm Number of attempts: 1 Airway Equipment and Method: Bougie stylet and Stylet Placement Confirmation: CO2 detector,  breath sounds checked- equal and bilateral and ETT inserted through vocal cords under direct vision Secured at: 23 cm Tube secured with: Tape Dental Injury: Teeth and Oropharynx as per pre-operative assessment  Difficulty Due To: Difficulty was anticipated, Difficult Airway- due to anterior larynx, Difficult Airway-  due to edematous airway, Difficult Airway- due to limited oral opening and Difficult Airway- due to reduced neck mobility Comments: Emergent intubation, after failed attempts by ED

## 2020-05-29 ENCOUNTER — Encounter (HOSPITAL_COMMUNITY): Payer: Self-pay | Admitting: Neurosurgery

## 2020-05-29 DIAGNOSIS — J988 Other specified respiratory disorders: Secondary | ICD-10-CM

## 2020-05-29 LAB — MRSA PCR SCREENING: MRSA by PCR: NEGATIVE

## 2020-05-29 LAB — GLUCOSE, CAPILLARY
Glucose-Capillary: 105 mg/dL — ABNORMAL HIGH (ref 70–99)
Glucose-Capillary: 129 mg/dL — ABNORMAL HIGH (ref 70–99)
Glucose-Capillary: 145 mg/dL — ABNORMAL HIGH (ref 70–99)
Glucose-Capillary: 153 mg/dL — ABNORMAL HIGH (ref 70–99)
Glucose-Capillary: 157 mg/dL — ABNORMAL HIGH (ref 70–99)
Glucose-Capillary: 160 mg/dL — ABNORMAL HIGH (ref 70–99)
Glucose-Capillary: 181 mg/dL — ABNORMAL HIGH (ref 70–99)

## 2020-05-29 LAB — MAGNESIUM
Magnesium: 1.7 mg/dL (ref 1.7–2.4)
Magnesium: 1.5 mg/dL — ABNORMAL LOW (ref 1.7–2.4)

## 2020-05-29 LAB — PHOSPHORUS
Phosphorus: 3.3 mg/dL (ref 2.5–4.6)
Phosphorus: 3.7 mg/dL (ref 2.5–4.6)

## 2020-05-29 LAB — SEDIMENTATION RATE: Sed Rate: 3 mm/hr (ref 0–22)

## 2020-05-29 LAB — RESPIRATORY PANEL BY RT PCR (FLU A&B, COVID)
Influenza A by PCR: NEGATIVE
Influenza B by PCR: NEGATIVE
SARS Coronavirus 2 by RT PCR: NEGATIVE

## 2020-05-29 MED ORDER — ACETAMINOPHEN 650 MG RE SUPP: 650.0000 mg | RECTAL | Status: DC | PRN

## 2020-05-29 MED ORDER — DOCUSATE SODIUM 50 MG/5ML PO LIQD
100.0000 mg | Freq: Two times a day (BID) | ORAL | Status: DC
  Administered 2020-05-29 – 2020-05-31 (×3): 100 mg via ORAL
  Filled 2020-05-29 (×3): qty 10

## 2020-05-29 MED ORDER — PROPOFOL 1000 MG/100ML IV EMUL
0.0000 ug/kg/min | INTRAVENOUS | Status: DC
  Administered 2020-05-29: 40 ug/kg/min via INTRAVENOUS
  Administered 2020-05-29: 35 ug/kg/min via INTRAVENOUS
  Administered 2020-05-29: 40 ug/kg/min via INTRAVENOUS
  Administered 2020-05-29: 50 ug/kg/min via INTRAVENOUS
  Administered 2020-05-29 (×2): 45 ug/kg/min via INTRAVENOUS
  Administered 2020-05-30: 50 ug/kg/min via INTRAVENOUS
  Administered 2020-05-30 (×2): 35 ug/kg/min via INTRAVENOUS
  Filled 2020-05-29: qty 200
  Filled 2020-05-29 (×6): qty 100

## 2020-05-29 MED ORDER — DEXAMETHASONE SODIUM PHOSPHATE 4 MG/ML IJ SOLN
4.0000 mg | Freq: Four times a day (QID) | INTRAMUSCULAR | Status: DC
  Administered 2020-05-29 – 2020-05-31 (×9): 4 mg via INTRAVENOUS
  Filled 2020-05-29 (×9): qty 1

## 2020-05-29 MED ORDER — POLYETHYLENE GLYCOL 3350 17 G PO PACK
17.0000 g | PACK | Freq: Every day | ORAL | Status: DC
  Administered 2020-05-29 – 2020-05-31 (×2): 17 g via ORAL
  Filled 2020-05-29 (×2): qty 1

## 2020-05-29 MED ORDER — MIDAZOLAM HCL 2 MG/2ML IJ SOLN: 1.0000 mg | INTRAMUSCULAR | Status: DC | PRN

## 2020-05-29 MED ORDER — FENTANYL CITRATE (PF) 100 MCG/2ML IJ SOLN: 25.0000 ug | INTRAMUSCULAR | Status: DC | PRN

## 2020-05-29 MED ORDER — PROSOURCE TF PO LIQD
45.0000 mL | Freq: Two times a day (BID) | ORAL | Status: DC
  Administered 2020-05-29 (×2): 45 mL
  Filled 2020-05-29 (×2): qty 45

## 2020-05-29 MED ORDER — MAGNESIUM SULFATE 2 GM/50ML IV SOLN
2.0000 g | Freq: Once | INTRAVENOUS | Status: AC
  Administered 2020-05-29: 2 g via INTRAVENOUS
  Filled 2020-05-29: qty 50

## 2020-05-29 MED ORDER — DEXAMETHASONE 4 MG PO TABS
4.0000 mg | ORAL_TABLET | Freq: Four times a day (QID) | ORAL | Status: DC
  Filled 2020-05-29 (×10): qty 1

## 2020-05-29 MED ORDER — LORATADINE 10 MG PO TABS
10.0000 mg | ORAL_TABLET | Freq: Every day | ORAL | Status: DC
  Administered 2020-05-29 – 2020-05-31 (×2): 10 mg
  Filled 2020-05-29 (×4): qty 1

## 2020-05-29 MED ORDER — FERROUS SULFATE 300 (60 FE) MG/5ML PO SYRP
300.0000 mg | ORAL_SOLUTION | Freq: Every day | ORAL | Status: DC
  Administered 2020-05-29 – 2020-05-31 (×2): 300 mg
  Filled 2020-05-29 (×3): qty 5

## 2020-05-29 MED ORDER — HYDROCHLOROTHIAZIDE 10 MG/ML ORAL SUSPENSION
12.5000 mg | Freq: Every day | ORAL | Status: DC
  Administered 2020-05-31: 13 mg
  Filled 2020-05-29 (×4): qty 1.88

## 2020-05-29 MED ORDER — VITAL HIGH PROTEIN PO LIQD
1000.0000 mL | ORAL | Status: DC
  Administered 2020-05-29: 1000 mL
  Filled 2020-05-29: qty 1000

## 2020-05-29 MED ORDER — FENTANYL CITRATE (PF) 100 MCG/2ML IJ SOLN
25.0000 ug | INTRAMUSCULAR | Status: DC | PRN
  Administered 2020-05-29 – 2020-05-30 (×5): 50 ug via INTRAVENOUS
  Administered 2020-05-30 (×2): 25 ug via INTRAVENOUS
  Administered 2020-05-30 – 2020-05-31 (×4): 50 ug via INTRAVENOUS
  Filled 2020-05-29 (×11): qty 2

## 2020-05-29 MED ORDER — CALCIUM CARBONATE ANTACID 500 MG PO CHEW
1.0000 | CHEWABLE_TABLET | ORAL | Status: DC | PRN
  Filled 2020-05-29: qty 1

## 2020-05-29 MED ORDER — INSULIN ASPART 100 UNIT/ML ~~LOC~~ SOLN
0.0000 [IU] | SUBCUTANEOUS | Status: DC
  Administered 2020-05-29: 4 [IU] via SUBCUTANEOUS
  Administered 2020-05-29 – 2020-05-30 (×3): 3 [IU] via SUBCUTANEOUS
  Administered 2020-05-30: 4 [IU] via SUBCUTANEOUS
  Administered 2020-05-30 – 2020-05-31 (×3): 3 [IU] via SUBCUTANEOUS

## 2020-05-29 MED ORDER — LEVOTHYROXINE SODIUM 25 MCG PO TABS
125.0000 ug | ORAL_TABLET | Freq: Every day | ORAL | Status: DC
  Administered 2020-05-29 – 2020-05-30 (×2): 125 ug
  Filled 2020-05-29: qty 1

## 2020-05-29 MED ORDER — MIDAZOLAM HCL 2 MG/2ML IJ SOLN
1.0000 mg | INTRAMUSCULAR | Status: DC | PRN
  Administered 2020-05-29 – 2020-05-30 (×2): 1 mg via INTRAVENOUS
  Filled 2020-05-29 (×2): qty 2

## 2020-05-29 MED ORDER — CITALOPRAM HYDROBROMIDE 10 MG PO TABS
10.0000 mg | ORAL_TABLET | Freq: Every day | ORAL | Status: DC
  Administered 2020-05-29 – 2020-05-31 (×2): 10 mg
  Filled 2020-05-29 (×4): qty 1

## 2020-05-29 MED ORDER — ROSUVASTATIN CALCIUM 5 MG PO TABS
10.0000 mg | ORAL_TABLET | Freq: Every day | ORAL | Status: DC
  Administered 2020-05-29 – 2020-05-31 (×2): 10 mg
  Filled 2020-05-29 (×4): qty 2

## 2020-05-29 MED ORDER — ACETAMINOPHEN 325 MG PO TABS: 650.0000 mg | ORAL_TABLET | ORAL | Status: DC | PRN

## 2020-05-29 NOTE — Progress Notes (Signed)
PT Cancellation Note  Patient Details Name: [name] MRN: [id number] DOB: July 19, 1956   Cancelled Treatment:    Reason Eval/Treat Not Completed: Patient not medically ready   Intubated and sedated;   Will follow for appropriateness of PT evaluation;   Roney Marion, PT  Acute Rehabilitation Services Pager [phone] Office [phone]    Colletta [place] 05/29/2020, 8:21 AM

## 2020-05-29 NOTE — Progress Notes (Signed)
NAME:  [name], MRN:  [id number], DOB:  06-22-1956, LOS: 1 ADMISSION DATE:  05/28/2020, CONSULTATION DATE:  10/19 REFERRING MD:  Vertell Limber, CHIEF COMPLAINT:  dyspnea   Brief History   64 y/o female underwent an elective ACDF on 10/19 and post operatively developed the sensation of dyspnea and required emergent re-intubation in the [facility] emergency department by anesthesia who noted an anterior airway with cord edema.  She then underwent wound exploration and hematoma evacuation with a JP drain placed.    Past Medical History  Anxiety Aortic regurgitation Carpal tunnel CKD, uncertain stage GERD Hyperlipidemia Hypothyroidism Migraine headache Mitral Valve prolapse OSA on CPAP Single kidney  Significant Hospital Events   10/19 ACDF, emergent re-intubation 10/20 no cuff leak  Consults:  PCCM  Procedures:  10/19 ACDF 10/19 ETT 10/19 cervical wound exploration  Significant Diagnostic Tests:    Micro Data:  10/19 sars cov 2/flu > negative  Antimicrobials:  10/19 post op ancef   Interim history/subjective:  No acute events Moved all four extremities overnight No cuff leak this morning  Objective   Blood pressure 96/61, pulse 77, temperature 97.9 F (36.6 C), temperature source Axillary, resp. rate (!) 3, height 5\' 4"  (1.626 m), weight 112 kg, SpO2 93 %.    Vent Mode: PRVC FiO2 (%):  [50 %-100 %] 50 % Set Rate:  [18 bmp] 18 bmp Vt Set:  [430 mL] 430 mL PEEP:  [5 cmH20] 5 cmH20 Plateau Pressure:  [18 cmH20] 18 cmH20   Intake/Output Summary (Last 24 hours) at 05/29/2020 0735 Last data filed at 05/29/2020 0600 Gross per 24 hour  Intake 1938.6 ml  Output 1264 ml  Net 674.6 ml   Filed Weights   05/28/20 2035  Weight: 112 kg    Examination:   General:  In bed on vent HENT: NCAT ETT in place, anterior neck surgical scar dressing and JP drain in place, clean, dry, intact PULM: Slight wheezing bilaterally, vent supported breathing CV: RRR, no mgr GI:  BS+, soft, nontender MSK: normal bulk and tone Neuro: sedated on vent  10/19 CXR images personally reviewed> cardiomegaly, ett in place, cervical spine fusion plate noted  Resolved Hospital Problem list     Assessment & Plan:  Airway compromise after elective ACDF due to laryngeal edema complicated by C3 osteophyte and neck hematoma now evacuated > 10/20 no cuff leak > ENT evaluation today > hold extubation as no cuff leak > would like to plan for in-OR extubation this week  > continue full vent support > VAP prevention > hold WUA/SBT today > continue to hold lisinopril thought doubt this is the culprit > continue decadron as ordered  Need for sedation for mechanical ventilation > order PAD protocol > RASS goal -1 > propofol infusion, prn fentanyl/versed  Single kidney > monitor renal function > monitor UOP  Hypothyroidism > synthroid  Hypertension > hold lisinopriol  DM2 with hyperglycemia on steroids > SSI > change to resistant scale > d/c D5 > start tube feding  Best practice:  Diet: start tube feeding Pain/Anxiety/Delirium protocol (if indicated): as above VAP protocol (if indicated): yes DVT prophylaxis: scd GI prophylaxis: Pantoprazole for stress ulcer prophylaxis Glucose control: SSI Mobility: bed rest Code Status: FULL Family Communication: updated her husband Tom bedside Disposition:   Labs   CBC: Recent Labs  Lab 05/28/20 1838 05/28/20 1859 05/28/20 2244  WBC  --  24.3*  --   NEUTROABS  --  19.8*  --   HGB 18.0*  16.1* 15.0  HCT 53.0* 53.6* 44.0  MCV  --  80.1  --   PLT  --  676*  --     Basic Metabolic Panel: Recent Labs  Lab 05/28/20 1838 05/28/20 1859 05/28/20 2244  NA 139 136 137  K 3.6 3.5 3.5  CL 100 98  --   CO2  --  27  --   GLUCOSE 173* 172*  --   BUN 14 13  --   CREATININE 1.00 1.20*  --   CALCIUM  --  9.9  --    GFR: Estimated Creatinine Clearance: 57.3 mL/min (A) (by C-G formula based on SCr of 1.2 mg/dL  (H)). Recent Labs  Lab 05/28/20 1859  WBC 24.3*    Liver Function Tests: No results for input(s): AST, ALT, ALKPHOS, BILITOT, PROT, ALBUMIN in the last 168 hours. No results for input(s): LIPASE, AMYLASE in the last 168 hours. No results for input(s): AMMONIA in the last 168 hours.  ABG    Component Value Date/Time   PHART 7.391 05/28/2020 2244   PCO2ART 44.9 05/28/2020 2244   PO2ART 76 (L) 05/28/2020 2244   HCO3 27.4 05/28/2020 2244   TCO2 29 05/28/2020 2244   O2SAT 95.0 05/28/2020 2244     Coagulation Profile: Recent Labs  Lab 05/28/20 2124  INR 1.1    Cardiac Enzymes: No results for input(s): CKTOTAL, CKMB, CKMBINDEX, TROPONINI in the last 168 hours.  HbA1C: Hgb A1c MFr Bld  Date/Time Value Ref Range Status  04/11/2020 09:58 AM 6.1 (H) <5.7 % of total Hgb Final    Comment:    For someone without known diabetes, a hemoglobin  A1c value between 5.7% and 6.4% is consistent with prediabetes and should be confirmed with a  follow-up test. . For someone with known diabetes, a value <7% indicates that their diabetes is well controlled. A1c targets should be individualized based on duration of diabetes, age, comorbid conditions, and other considerations. . This assay result is consistent with an increased risk of diabetes. . Currently, no consensus exists regarding use of hemoglobin A1c for diagnosis of diabetes for children. .   07/12/2017 09:20 AM 5.7 (H) <5.7 % of total Hgb Final    Comment:    For someone without known diabetes, a hemoglobin  A1c value between 5.7% and 6.4% is consistent with prediabetes and should be confirmed with a  follow-up test. . For someone with known diabetes, a value <7% indicates that their diabetes is well controlled. A1c targets should be individualized based on duration of diabetes, age, comorbid conditions, and other considerations. . This assay result is consistent with an increased risk of diabetes. . Currently,  no consensus exists regarding use of hemoglobin A1c for diagnosis of diabetes for children. .     CBG: Recent Labs  Lab 05/28/20 2032 05/29/20 0019 05/29/20 0439  GLUCAP 200* 160* 181*     Critical care time: 40 minutes     Roselie Awkward, MD [place] Pager: [phone] Cell: [phone] If no response, call [phone]

## 2020-05-29 NOTE — Progress Notes (Signed)
[place] Progress Note Patient Name: [name] DOB: 1956-02-09 MRN: [id number]   Date of Service  05/29/2020  HPI/Events of Note  Nursing request for COVID screening test.   eICU Interventions  Will order 2 hr COVID  PCR test.      Intervention Category Major Interventions: Other:  Carmelina Balducci Cornelia Copa 05/29/2020, 12:06 AM

## 2020-05-29 NOTE — Consult Note (Signed)
Reason for Consult: Difficult airway, respiratory distress, laryngospasm Referring Physician: Simonne Maffucci, MD  HPI:  [name] is an 64 y.o. female who underwent cervical fusion surgery yesterday. The surgery was performed at the [facility]. After extubation, the patient had apparent laryngospasm and respiratory distress. She was transferred emergently to the [facility] ER by EMS. Multiple intubation attempts in the ER was unsuccessful. Her airway was anterior, with significant edema. She was eventually intubated by anesthesia. She also underwent hematoma evacuation by Dr. Vertell Limber. She is currently on vent. ENT consulted for management of her difficult airway.   Past Medical History:  Diagnosis Date  . Acute medial meniscal tear    Right knee  . Allergy   . Anxiety   . Aortic insufficiency    Mild  . Aortic regurgitation   . Arthritis   . Carpal tunnel syndrome   . Chronic kidney disease    one kidney   . Dysrhythmia    pvc's at times  . GERD (gastroesophageal reflux disease)   . Hyperlipidemia   . Hypothyroidism   . Migraine headache   . Mitral regurgitation   . Mitral valve prolapse   . Nausea alone   . Need for SBE (subacute bacterial endocarditis) prophylaxis   . Pilonidal cyst without mention of abscess   . Pneumonia Aug 14 2012  . PONV (postoperative nausea and vomiting)    states she vasovagals  . Sleep apnea    wears CPAP  . Thyroid disease   . Transplanted kidney removed    donated kidney    Past Surgical History:  Procedure Laterality Date  . ABDOMINAL HYSTERECTOMY    . CARPAL TUNNEL RELEASE Right   . CARPAL TUNNEL RELEASE Left 02/25/2017   Procedure: LEFT OPEN CARPAL TUNNEL RELEASE;  Surgeon: [name], MD;  Location: [place];  Service: Orthopedics;  Laterality: Left;  . COLONOSCOPY    . KNEE ARTHROSCOPY Right 01/06/2018   Procedure: RIGHT KNEE ARTHROSCOPY WITH PARTIAL  MEDIAL MENISCECTOMY;  Surgeon: [name], MD;   Location: [place];  Service: Orthopedics;  Laterality: Right;  . KNEE SURGERY    . NEPHRECTOMY    . PARTIAL KNEE ARTHROPLASTY Right 03/31/2018   Procedure: RIGHT KNEE UNI ARTHROPLASTY;  Surgeon: Marchia Bond, MD;  Location: [place];  Service: Orthopedics;  Laterality: Right;  . TONSILLECTOMY    . TOTAL KNEE ARTHROPLASTY Left 09/14/2013   Procedure: LEFT TOTAL KNEE ARTHROPLASTY;  Surgeon: [name], MD;  Location: [place];  Service: Orthopedics;  Laterality: Left;  . TUBAL LIGATION    . WISDOM TOOTH EXTRACTION      Family History  Problem Relation Age of Onset  . Diabetes Mother   . Hypertension Mother   . Heart disease Father   . Hypertension Father   . Thyroid disease Father   . Cancer Maternal Aunt   . Cancer Maternal Aunt   . Colon cancer Neg Hx   . Esophageal cancer Neg Hx   . Rectal cancer Neg Hx   . Stomach cancer Neg Hx     Social History:  reports that she quit smoking about 36 years ago. She has never used smokeless tobacco. She reports current alcohol use. She reports that she does not use drugs.  Allergies:  Allergies  Allergen Reactions  . Percocet [Oxycodone-Acetaminophen] Other (See Comments)    SYNCOPE TACHYCARDIA  . Shrimp [Shellfish Allergy] Anaphylaxis, Itching, Nausea And Vomiting and Swelling  . Hydrocodone-Acetaminophen Nausea  And Vomiting and Other (See Comments)    Dizziness, also  . Lipitor [Atorvastatin] Other (See Comments)    Ear pain/fullness in ear "as if I were under water"  . Saxenda [Liraglutide -Weight Management] Hives, Itching and Other (See Comments)    Hives/itching/site reaction  . Adhesive [Tape] Rash and Other (See Comments)    EKG leads and certain bandages break out the skin  . Belviq Xr [Lorcaserin Hcl Er] Other (See Comments)    Gained weight   . Morphine And Related Nausea And Vomiting    Prior to Admission medications   Medication Sig Start Date End Date Taking? Authorizing Provider   acetaminophen (TYLENOL) 325 MG tablet Take 325-650 mg by mouth every 6 (six) hours as needed for mild pain or headache.   Yes [provider]  calcium carbonate (TUMS - DOSED IN MG ELEMENTAL CALCIUM) 500 MG chewable tablet Chew 1 tablet by mouth as needed for indigestion or heartburn.   Yes [provider]  Carboxymethylcellul-Glycerin (LUBRICATING EYE DROPS OP) Place 1 drop into both eyes as needed (dry eyes).    Yes [provider]  citalopram (CELEXA) 20 MG tablet Take 0.5 tablets (10 mg total) by mouth daily. Patient taking differently: Take 10 mg by mouth at bedtime.  01/05/20  Yes Breeback, Jade L, PA-C  Cyanocobalamin (B-12 PO) Take 1 tablet by mouth daily.    Yes [provider]  esomeprazole (NEXIUM) 40 MG capsule TAKE 1 CAPSULE (40 MG TOTAL) BY MOUTH DAILY BEFORE BREAKFAST. Patient taking differently: Take 40 mg by mouth daily before breakfast.  01/22/20  Yes Breeback, Jade L, PA-C  hydrochlorothiazide (MICROZIDE) 12.5 MG capsule Take 1 capsule (12.5 mg total) by mouth daily. 01/05/20  Yes Breeback, Jade L, PA-C  levothyroxine (SYNTHROID) 125 MCG tablet TAKE 1 TABLET BY MOUTH ONCE DAILY Patient taking differently: Take 125 mcg by mouth daily before breakfast.  01/05/20  Yes Breeback, Jade L, PA-C  lisinopril (ZESTRIL) 2.5 MG tablet Take 1 tablet (2.5 mg total) by mouth daily. 01/05/20  Yes Breeback, Jade L, PA-C  Pyridoxine HCl (B-6 PO) Take 1 tablet by mouth daily.    Yes [provider]  rosuvastatin (CRESTOR) 10 MG tablet Take 1 tablet (10 mg total) by mouth daily. 01/05/20  Yes Breeback, Jade L, PA-C  Semaglutide,0.25 or 0.5MG/DOS, (OZEMPIC, 0.25 OR 0.5 MG/DOSE,) 2 MG/1.5ML SOPN Inject 0.25 mg into the skin once a week. 04/22/20  Yes Breeback, Jade L, PA-C  traMADol (ULTRAM) 50 MG tablet Take 50 mg by mouth in the morning and at bedtime.  05/15/20  Yes [provider]  albuterol (PROVENTIL HFA;VENTOLIN HFA) 108 (90 Base) MCG/ACT inhaler  Inhale 2 puffs into the lungs every 6 (six) hours as needed for wheezing or shortness of breath. 03/09/18   Breeback, Jade L, PA-C  cetirizine (ZYRTEC) 10 MG tablet Take 1 tablet (10 mg total) by mouth at bedtime. 01/25/14 05/28/20  Jonathon Resides, MD  Cholecalciferol (VITAMIN D3) 3000 units TABS Take 3,000 Units by mouth See admin instructions. Take 3,000 units by mouth two times a week- on Wednesdays & Saturdays.    [provider]  ferrous sulfate 325 (65 FE) MG EC tablet Take 1 tablet (325 mg total) by mouth daily with breakfast. 04/17/20   Breeback, Jade L, PA-C  metFORMIN (GLUCOPHAGE) 500 MG tablet Take 1 tablet (500 mg total) by mouth 2 (two) times daily with a meal. 04/17/20   Breeback, Jade L, PA-C  predniSONE (DELTASONE) 50  MG tablet One tab PO daily for 5 days. Patient not taking: Reported on 05/28/2020 04/11/20   Donella Stade, PA-C    Medications:  I have reviewed the patient's current medications. Scheduled: . chlorhexidine gluconate (MEDLINE KIT)  15 mL Mouth Rinse BID  . Chlorhexidine Gluconate Cloth  6 each Topical Daily  . cholecalciferol  3,000 Units Oral Once per day on Wed Sat  . citalopram  10 mg Per Tube Daily  . dexamethasone  4 mg Per Tube Q6H   Or  . dexamethasone (DECADRON) injection  4 mg Intravenous Q6H  . docusate  100 mg Oral BID  . ferrous sulfate  300 mg Per Tube Q breakfast  . hydrochlorothiazide  13 mg Per Tube Daily  . insulin aspart  0-20 Units Subcutaneous Q4H  . levothyroxine  125 mcg Per Tube Q0600  . loratadine  10 mg Per Tube Daily  . mouth rinse  15 mL Mouth Rinse 10 times per day  . pantoprazole (PROTONIX) IV  40 mg Intravenous QHS  . polyethylene glycol  17 g Oral Daily  . rosuvastatin  10 mg Per Tube Daily  . sodium chloride flush  3 mL Intravenous Q12H   Continuous: . sodium chloride    .  ceFAZolin (ANCEF) IV Stopped (05/29/20 0414)  . propofol (DIPRIVAN) infusion 40 mcg/kg/min (05/29/20 0908)    Results for orders placed or  performed during the hospital encounter of 05/28/20 (from the past 48 hour(s))  I-stat chem 8, ed     Status: Abnormal   Collection Time: 05/28/20  6:38 PM  Result Value Ref Range   Sodium 139 135 - 145 mmol/L   Potassium 3.6 3.5 - 5.1 mmol/L   Chloride 100 98 - 111 mmol/L   BUN 14 8 - 23 mg/dL   Creatinine, Ser 1.00 0.44 - 1.00 mg/dL   Glucose, Bld 173 (H) 70 - 99 mg/dL    Comment: Glucose reference range applies only to samples taken after fasting for at least 8 hours.   Calcium, Ion 1.33 1.15 - 1.40 mmol/L   TCO2 28 22 - 32 mmol/L   Hemoglobin 18.0 (H) 12.0 - 15.0 g/dL   HCT 53.0 (H) 36 - 46 %  CBC with Differential/Platelet     Status: Abnormal   Collection Time: 05/28/20  6:59 PM  Result Value Ref Range   WBC 24.3 (H) 4.0 - 10.5 K/uL   RBC 6.69 (H) 3.87 - 5.11 MIL/uL   Hemoglobin 16.1 (H) 12.0 - 15.0 g/dL   HCT 53.6 (H) 36 - 46 %   MCV 80.1 80.0 - 100.0 fL   MCH 24.1 (L) 26.0 - 34.0 pg   MCHC 30.0 30.0 - 36.0 g/dL   RDW 18.6 (H) 11.5 - 15.5 %   Platelets 676 (H) 150 - 400 K/uL   nRBC 0.0 0.0 - 0.2 %   Neutrophils Relative % 81 %   Neutro Abs 19.8 (H) 1.7 - 7.7 K/uL   Lymphocytes Relative 13 %   Lymphs Abs 3.2 0.7 - 4.0 K/uL   Monocytes Relative 3 %   Monocytes Absolute 0.8 0.1 - 1.0 K/uL   Eosinophils Relative 1 %   Eosinophils Absolute 0.2 0.0 - 0.5 K/uL   Basophils Relative 1 %   Basophils Absolute 0.1 0.0 - 0.1 K/uL   Immature Granulocytes 1 %   Abs Immature Granulocytes 0.24 (H) 0.00 - 0.07 K/uL    Comment: Performed at [facility], [address]  [place], [place] [address]  Basic metabolic panel     Status: Abnormal   Collection Time: 05/28/20  6:59 PM  Result Value Ref Range   Sodium 136 135 - 145 mmol/L   Potassium 3.5 3.5 - 5.1 mmol/L   Chloride 98 98 - 111 mmol/L   CO2 27 22 - 32 mmol/L   Glucose, Bld 172 (H) 70 - 99 mg/dL    Comment: Glucose reference range applies only to samples taken after fasting for at least 8 hours.   BUN 13 8 - 23 mg/dL    Creatinine, Ser 1.20 (H) 0.44 - 1.00 mg/dL   Calcium 9.9 8.9 - 10.3 mg/dL   GFR, Estimated 47 (L) >60 mL/min   Anion gap 11 5 - 15    Comment: Performed at [facility] [address], [place], [place] [address]  Glucose, capillary     Status: Abnormal   Collection Time: 05/28/20  8:32 PM  Result Value Ref Range   Glucose-Capillary 200 (H) 70 - 99 mg/dL    Comment: Glucose reference range applies only to samples taken after fasting for at least 8 hours.  Triglycerides     Status: None   Collection Time: 05/28/20  9:24 PM  Result Value Ref Range   Triglycerides 104 <150 mg/dL    Comment: Performed at [facility], [place] [address], [place], [place] [address]  Protime-INR     Status: None   Collection Time: 05/28/20  9:24 PM  Result Value Ref Range   Prothrombin Time 14.2 11.4 - 15.2 seconds   INR 1.1 0.8 - 1.2    Comment: (NOTE) INR goal varies based on device and disease states. Performed at [facility], [place] [address] [place], [place] [address]   MRSA PCR Screening     Status: None   Collection Time: 05/28/20 10:13 PM   Specimen: Nasal Mucosa; Nasopharyngeal  Result Value Ref Range   MRSA by PCR NEGATIVE NEGATIVE    Comment:        The GeneXpert MRSA Assay (FDA approved for NASAL specimens only), is one component of a comprehensive MRSA colonization surveillance program. It is not intended to diagnose MRSA infection nor to guide or monitor treatment for MRSA infections. Performed at [facility], [place] [address], [place], [place] [address]   I-STAT 7, (LYTES, BLD GAS, ICA, H+H)     Status: Abnormal   Collection Time: 05/28/20 10:44 PM  Result Value Ref Range   pH, Arterial 7.391 7.35 - 7.45   pCO2 arterial 44.9 32 - 48 mmHg   pO2, Arterial 76 (L) 83 - 108 mmHg   Bicarbonate 27.4 20.0 - 28.0 mmol/L   TCO2 29 22 - 32 mmol/L   O2 Saturation 95.0 %   Acid-Base Excess 2.0 0.0 - 2.0 mmol/L   Sodium 137 135 - 145 mmol/L   Potassium 3.5 3.5 -  5.1 mmol/L   Calcium, Ion 1.30 1.15 - 1.40 mmol/L   HCT 44.0 36 - 46 %   Hemoglobin 15.0 12.0 - 15.0 g/dL   Patient temperature 97.6 F    Sample type ARTERIAL   Sedimentation rate     Status: None   Collection Time: 05/28/20 10:47 PM  Result Value Ref Range   Sed Rate 3 0 - 22 mm/hr    Comment: Performed at [facility] [address], [place], [place] [address]  C-reactive protein     Status: None  Collection Time: 05/28/20 10:47 PM  Result Value Ref Range   CRP <0.5 <1.0 mg/dL    Comment: Performed at [facility], [place] [address] [place], [place] [address]  Glucose, capillary     Status: Abnormal   Collection Time: 05/29/20 12:19 AM  Result Value Ref Range   Glucose-Capillary 160 (H) 70 - 99 mg/dL    Comment: Glucose reference range applies only to samples taken after fasting for at least 8 hours.   Comment 1 Notify RN   Respiratory Panel by RT PCR (Flu A&B, Covid) - Nasopharyngeal Swab     Status: None   Collection Time: 05/29/20 12:43 AM   Specimen: Nasopharyngeal Swab  Result Value Ref Range   SARS Coronavirus 2 by RT PCR NEGATIVE NEGATIVE    Comment: (NOTE) SARS-CoV-2 target nucleic acids are NOT DETECTED.  The SARS-CoV-2 RNA is generally detectable in upper respiratoy specimens during the acute phase of infection. The lowest concentration of SARS-CoV-2 viral copies this assay can detect is 131 copies/mL. A negative result does not preclude SARS-Cov-2 infection and should not be used as the sole basis for treatment or other patient management decisions. A negative result may occur with  improper specimen collection/handling, submission of specimen other than nasopharyngeal swab, presence of viral mutation(s) within the areas targeted by this assay, and inadequate number of viral copies (<131 copies/mL). A negative result must be combined with clinical observations, patient history, and epidemiological information. The expected result is Negative.  Fact  Sheet for Patients:  PinkCheek.be  Fact Sheet for Healthcare Providers:  GravelBags.it  This test is no t yet approved or cleared by the [place] FDA and  has been authorized for detection and/or diagnosis of SARS-CoV-2 by FDA under an Emergency Use Authorization (EUA). This EUA will remain  in effect (meaning this test can be used) for the duration of the COVID-19 declaration under Section 564(b)(1) of the Act, 21 U.S.C. section 360bbb-3(b)(1), unless the authorization is terminated or revoked sooner.     Influenza A by PCR NEGATIVE NEGATIVE   Influenza B by PCR NEGATIVE NEGATIVE    Comment: (NOTE) The Xpert Xpress SARS-CoV-2/FLU/RSV assay is intended as an aid in  the diagnosis of influenza from Nasopharyngeal swab specimens and  should not be used as a sole basis for treatment. Nasal washings and  aspirates are unacceptable for Xpert Xpress SARS-CoV-2/FLU/RSV  testing.  Fact Sheet for Patients: PinkCheek.be  Fact Sheet for Healthcare Providers: GravelBags.it  This test is not yet approved or cleared by the [place] FDA and  has been authorized for detection and/or diagnosis of SARS-CoV-2 by  FDA under an Emergency Use Authorization (EUA). This EUA will remain  in effect (meaning this test can be used) for the duration of the  Covid-19 declaration under Section 564(b)(1) of the Act, 21  U.S.C. section 360bbb-3(b)(1), unless the authorization is  terminated or revoked. Performed at [facility], [place] [address], [place], [place] [address]   Glucose, capillary     Status: Abnormal   Collection Time: 05/29/20  4:39 AM  Result Value Ref Range   Glucose-Capillary 181 (H) 70 - 99 mg/dL    Comment: Glucose reference range applies only to samples taken after fasting for at least 8 hours.   Comment 1 Notify RN   Glucose, capillary     Status: Abnormal    Collection Time: 05/29/20  7:53 AM  Result Value Ref Range   Glucose-Capillary 157 (H) 70 -  99 mg/dL    Comment: Glucose reference range applies only to samples taken after fasting for at least 8 hours.    DG Abd 1 View  Result Date: 05/28/2020 CLINICAL DATA:  OG tube placement EXAM: ABDOMEN - 1 VIEW COMPARISON:  CT 05/23/2019 FINDINGS: Esophageal tube tip and side port overlie the proximal stomach. There is moderate gaseous dilatation of the stomach. There is consolidation at the left lung base. IMPRESSION: Esophageal tube tip overlies the proximal stomach. Moderate gaseous dilatation of the stomach. Electronically Signed   By: Donavan Foil M.D.   On: 05/28/2020 20:47   DG Chest Port 1 View  Result Date: 05/28/2020 CLINICAL DATA:  Intubation EXAM: PORTABLE CHEST 1 VIEW COMPARISON:  09/07/2013 FINDINGS: Endotracheal tube is 4 cm above the carina. Mild cardiomegaly. Bilateral lower lobe airspace opacities. Small left pleural effusion. No pneumothorax or acute bony abnormality. NG tube is in the stomach. IMPRESSION: Bilateral lower lobe airspace opacities, left greater than right concerning for pneumonia. Small left pleural effusion. Mild cardiomegaly. Electronically Signed   By: Rolm Baptise M.D.   On: 05/28/2020 20:47   Review of Systems  Unable to perform ROS: Patient intubated and unresponsive.  Blood pressure (!) 88/51, pulse 72, temperature 98 F (36.7 C), temperature source Axillary, resp. rate 18, height 5' 4"  (1.626 m), weight 112 kg, SpO2 94 %. General appearance: Obese, intubated, not responsive. Head: Normocephalic, without obvious abnormality, atraumatic Ears: Examination of the ears shows normal auricles and external auditory canals bilaterally.  Nose: Nasal examination shows normal mucosa, septum, turbinates.  Face: Facial examination shows no asymmetry.  Mouth: ET tube in place. No mucosal abnormality. Neck: Obese neck. Laryngeal framework palpable. Neuro: On vent. Not  responsive.  Assessment/Plan: Recent laryngospasm and respiratory distress. Found to have difficult airway. Currently intubated.  - On IV decadron Q6 hours. - If cuff leak is noted, plan extubation and laryngoscopy in the OR. - Will follow.  Brelynn Wheller W Rabiah Goeser 05/29/2020, 9:53 AM

## 2020-05-29 NOTE — Progress Notes (Signed)
[facility] Progress Note Patient Name: ELISSE PENNICK DOB: 07/21/1956 MRN: [id number]   Date of Service  05/29/2020  HPI/Events of Note  Hypomagnesemia - Mg++ = 1.5 and Creatinine = 1.2.  eICU Interventions  Will replace Mg++.     Intervention Category Major Interventions: Electrolyte abnormality - evaluation and management  Jenea Dake [place] 05/29/2020, 7:50 PM

## 2020-05-29 NOTE — Progress Notes (Signed)
Subjective: Patient reports sedated, on vent  Objective: Vital signs in last 24 hours: Temp:  [97.8 F (36.6 C)-98 F (36.7 C)] 98 F (36.7 C) (10/20 0830) Pulse Rate:  [69-137] 69 (10/20 0810) Resp:  [3-31] 18 (10/20 0810) BP: (94-215)/(55-113) 96/62 (10/20 0810) SpO2:  [25 %-100 %] 95 % (10/20 0810) FiO2 (%):  [50 %-100 %] 50 % (10/20 0810) Weight:  [751 kg] 112 kg (10/19 2035)  Intake/Output from previous day: 10/19 0701 - 10/20 0700 In: 1938.6 [I.V.:1838.5; IV Piggyback:100.1] Out: 1264 [Urine:830; Emesis/NG output:300; Drains:34; Blood:100] Intake/Output this shift: No intake/output data recorded.  Physical Exam: Neck soft, minimal drainage from JP.  On vent and sedated.  MAEW.  Lab Results: Recent Labs    05/28/20 1859 05/28/20 2244  WBC 24.3*  --   HGB 16.1* 15.0  HCT 53.6* 44.0  PLT 676*  --    BMET Recent Labs    05/28/20 1838 05/28/20 1838 05/28/20 1859 05/28/20 2244  NA 139   < > 136 137  K 3.6   < > 3.5 3.5  CL 100  --  98  --   CO2  --   --  27  --   GLUCOSE 173*  --  172*  --   BUN 14  --  13  --   CREATININE 1.00  --  1.20*  --   CALCIUM  --   --  9.9  --    < > = values in this interval not displayed.    Studies/Results: DG Abd 1 View  Result Date: 05/28/2020 CLINICAL DATA:  OG tube placement EXAM: ABDOMEN - 1 VIEW COMPARISON:  CT 05/23/2019 FINDINGS: Esophageal tube tip and side port overlie the proximal stomach. There is moderate gaseous dilatation of the stomach. There is consolidation at the left lung base. IMPRESSION: Esophageal tube tip overlies the proximal stomach. Moderate gaseous dilatation of the stomach. Electronically Signed   By: Donavan Foil M.D.   On: 05/28/2020 20:47   DG Chest Port 1 View  Result Date: 05/28/2020 CLINICAL DATA:  Intubation EXAM: PORTABLE CHEST 1 VIEW COMPARISON:  09/07/2013 FINDINGS: Endotracheal tube is 4 cm above the carina. Mild cardiomegaly. Bilateral lower lobe airspace opacities. Small left  pleural effusion. No pneumothorax or acute bony abnormality. NG tube is in the stomach. IMPRESSION: Bilateral lower lobe airspace opacities, left greater than right concerning for pneumonia. Small left pleural effusion. Mild cardiomegaly. Electronically Signed   By: Rolm Baptise M.D.   On: 05/28/2020 20:47    Assessment/Plan: Continue rest and vent support today.  I appreciate Dr. Anastasia Pall kind assistance.  Discussed situation with patient's husband, who agrees with plan of rest on ventilator today and plan for extubation tomorrow.    LOS: 1 day    Peggyann Shoals, MD 05/29/2020, 8:43 AM

## 2020-05-29 NOTE — Progress Notes (Signed)
OT Cancellation Note  Patient Details Name: [name] MRN: [id number] DOB: [birth year]   Cancelled Treatment:    Reason Eval/Treat Not Completed: Patient not medically ready--pt intubated and sedated, not appropriate for OT at this time.  Will follow and see as able.   Jolaine Artist, OT Acute Rehabilitation Services Pager [phone] Office [phone]   Delight Stare 05/29/2020, 8:20 AM

## 2020-05-30 ENCOUNTER — Inpatient Hospital Stay (HOSPITAL_COMMUNITY): Payer: 59 | Admitting: Anesthesiology

## 2020-05-30 ENCOUNTER — Encounter (HOSPITAL_COMMUNITY)
Admission: EM | Disposition: A | Discharge status: Home / Self Care | Payer: Self-pay | Source: Ambulatory Visit | Attending: Neurosurgery

## 2020-05-30 ENCOUNTER — Inpatient Hospital Stay (HOSPITAL_COMMUNITY): Payer: 59

## 2020-05-30 HISTORY — PX: LARYNGOSCOPY: SHX5203

## 2020-05-30 LAB — CBC
HCT: 41.5 % (ref 36.0–46.0)
Hemoglobin: 13 g/dL (ref 12.0–15.0)
MCH: 24.4 pg — ABNORMAL LOW (ref 26.0–34.0)
MCHC: 31.3 g/dL (ref 30.0–36.0)
MCV: 77.9 fL — ABNORMAL LOW (ref 80.0–100.0)
Platelets: 376 10*3/uL (ref 150–400)
RBC: 5.33 MIL/uL — ABNORMAL HIGH (ref 3.87–5.11)
RDW: 18.1 % — ABNORMAL HIGH (ref 11.5–15.5)
WBC: 14.9 10*3/uL — ABNORMAL HIGH (ref 4.0–10.5)
nRBC: 0 % (ref 0.0–0.2)

## 2020-05-30 LAB — BASIC METABOLIC PANEL
Anion gap: 9 (ref 5–15)
BUN: 20 mg/dL (ref 8–23)
CO2: 25 mmol/L (ref 22–32)
Calcium: 8.9 mg/dL (ref 8.9–10.3)
Chloride: 100 mmol/L (ref 98–111)
Creatinine, Ser: 1.13 mg/dL — ABNORMAL HIGH (ref 0.44–1.00)
GFR, Estimated: 51 mL/min — ABNORMAL LOW (ref >60)
Glucose, Bld: 150 mg/dL — ABNORMAL HIGH (ref 70–99)
Potassium: 3.9 mmol/L (ref 3.5–5.1)
Sodium: 134 mmol/L — ABNORMAL LOW (ref 135–145)

## 2020-05-30 LAB — C1 ESTERASE INHIBITOR: C1INH SerPl-mCnc: 23 mg/dL (ref 21–39)

## 2020-05-30 LAB — GLUCOSE, CAPILLARY
Glucose-Capillary: 163 mg/dL — ABNORMAL HIGH (ref 70–99)
Glucose-Capillary: 146 mg/dL — ABNORMAL HIGH (ref 70–99)
Glucose-Capillary: 113 mg/dL — ABNORMAL HIGH (ref 70–99)
Glucose-Capillary: 111 mg/dL — ABNORMAL HIGH (ref 70–99)
Glucose-Capillary: 103 mg/dL — ABNORMAL HIGH (ref 70–99)
Glucose-Capillary: 131 mg/dL — ABNORMAL HIGH (ref 70–99)

## 2020-05-30 LAB — TRIGLYCERIDES: Triglycerides: 103 mg/dL (ref <150)

## 2020-05-30 LAB — PHOSPHORUS: Phosphorus: 2.9 mg/dL (ref 2.5–4.6)

## 2020-05-30 LAB — MAGNESIUM: Magnesium: 2.4 mg/dL (ref 1.7–2.4)

## 2020-05-30 SURGERY — LARYNGOSCOPY
Anesthesia: Choice | Site: Throat

## 2020-05-30 MED ORDER — ORAL CARE MOUTH RINSE
15.0000 mL | Freq: Two times a day (BID) | OROMUCOSAL | Status: DC
  Administered 2020-05-31: 15 mL via OROMUCOSAL

## 2020-05-30 MED ORDER — DIAZEPAM 5 MG/ML IJ SOLN
5.0000 mg | Freq: Four times a day (QID) | INTRAMUSCULAR | Status: DC | PRN
  Administered 2020-05-30 – 2020-05-31 (×2): 5 mg via INTRAVENOUS
  Filled 2020-05-30 (×2): qty 2

## 2020-05-30 MED ORDER — LACTATED RINGERS IV SOLN: INTRAVENOUS | Status: DC | PRN

## 2020-05-30 MED ORDER — KCL IN DEXTROSE-NACL 20-5-0.45 MEQ/L-%-% IV SOLN
INTRAVENOUS | Status: DC
  Filled 2020-05-30 (×2): qty 1000

## 2020-05-30 SURGICAL SUPPLY — 25 items
BLADE SURG 15 STRL LF DISP TIS (BLADE) IMPLANT
BLADE SURG 15 STRL SS (BLADE)
CANISTER SUCT 3000ML PPV (MISCELLANEOUS) ×2 IMPLANT
CNTNR URN SCR LID CUP LEK RST (MISCELLANEOUS) IMPLANT
CONT SPEC 4OZ STRL OR WHT (MISCELLANEOUS)
COVER BACK TABLE 60X90IN (DRAPES) ×2 IMPLANT
COVER WAND RF STERILE (DRAPES) ×2 IMPLANT
DRAPE HALF SHEET 40X57 (DRAPES) ×2 IMPLANT
GAUZE SPONGE 4X4 12PLY STRL (GAUZE/BANDAGES/DRESSINGS) ×2 IMPLANT
GLOVE BIO SURGEON STRL SZ7.5 (GLOVE) ×2 IMPLANT
GOWN STRL REUS W/ TWL LRG LVL3 (GOWN DISPOSABLE) ×2 IMPLANT
GOWN STRL REUS W/TWL LRG LVL3 (GOWN DISPOSABLE) ×4
GUARD TEETH (MISCELLANEOUS) IMPLANT
KIT BASIN OR (CUSTOM PROCEDURE TRAY) ×2 IMPLANT
KIT TURNOVER KIT B (KITS) ×2 IMPLANT
NEEDLE HYPO 25GX1X1/2 BEV (NEEDLE) IMPLANT
NS IRRIG 1000ML POUR BTL (IV SOLUTION) ×2 IMPLANT
PAD ARMBOARD 7.5X6 YLW CONV (MISCELLANEOUS) ×4 IMPLANT
PATTIES SURGICAL .5 X1 (DISPOSABLE) IMPLANT
SOL ANTI FOG 6CC (MISCELLANEOUS) IMPLANT
SOLUTION ANTI FOG 6CC (MISCELLANEOUS)
SURGILUBE 2OZ TUBE FLIPTOP (MISCELLANEOUS) ×2 IMPLANT
TOWEL GREEN STERILE FF (TOWEL DISPOSABLE) ×4 IMPLANT
TUBE CONNECTING 12X1/4 (SUCTIONS) ×2 IMPLANT
WATER STERILE IRR 1000ML POUR (IV SOLUTION) ×2 IMPLANT

## 2020-05-30 NOTE — Progress Notes (Signed)
Subjective: No issues overnight. Air leak noted. Still on vent.  Objective: Vital signs in last 24 hours: Temp:  [96.9 F (36.1 C)-97.9 F (36.6 C)] 97.7 F (36.5 C) (10/21 0807) Pulse Rate:  [57-74] 61 (10/21 1137) Resp:  [16-19] 17 (10/21 1137) BP: (88-120)/(50-72) 98/56 (10/21 1100) SpO2:  [93 %-100 %] 98 % (10/21 1137) FiO2 (%):  [40 %-50 %] 40 % (10/21 1137) Weight:  [107.8 kg] 107.8 kg (10/21 0400)  General appearance: Intubated, more awake today. Head: Normocephalic, without obvious abnormality, atraumatic Ears: Examination of the ears shows normal auricles and external auditory canals bilaterally.  Nose: Nasal examination shows normal mucosa, septum, turbinates.  Face: Facial examination shows no asymmetry.  Mouth: ET tube in place. No mucosal abnormality. Neck: Obese neck. Laryngeal framework palpable. Neuro: On vent. Awake and responsive.  Recent Labs    05/28/20 1859 05/28/20 1859 05/28/20 2244 05/30/20 0635  WBC 24.3*  --   --  14.9*  HGB 16.1*   < > 15.0 13.0  HCT 53.6*   < > 44.0 41.5  PLT 676*  --   --  376   < > = values in this interval not displayed.   Recent Labs    05/28/20 1859 05/28/20 1859 05/28/20 2244 05/30/20 0635  NA 136   < > 137 134*  K 3.5   < > 3.5 3.9  CL 98  --   --  100  CO2 27  --   --  25  GLUCOSE 172*  --   --  150*  BUN 13  --   --  20  CREATININE 1.20*  --   --  1.13*  CALCIUM 9.9  --   --  8.9   < > = values in this interval not displayed.    Medications:  I have reviewed the patient's current medications. Scheduled: . chlorhexidine gluconate (MEDLINE KIT)  15 mL Mouth Rinse BID  . Chlorhexidine Gluconate Cloth  6 each Topical Daily  . cholecalciferol  3,000 Units Oral Once per day on Wed Sat  . citalopram  10 mg Per Tube Daily  . dexamethasone  4 mg Per Tube Q6H   Or  . dexamethasone (DECADRON) injection  4 mg Intravenous Q6H  . docusate  100 mg Oral BID  . feeding supplement (PROSource TF)  45 mL Per Tube BID   . feeding supplement (VITAL HIGH PROTEIN)  1,000 mL Per Tube Q24H  . ferrous sulfate  300 mg Per Tube Q breakfast  . hydrochlorothiazide  13 mg Per Tube Daily  . insulin aspart  0-20 Units Subcutaneous Q4H  . levothyroxine  125 mcg Per Tube Q0600  . loratadine  10 mg Per Tube Daily  . mouth rinse  15 mL Mouth Rinse 10 times per day  . pantoprazole (PROTONIX) IV  40 mg Intravenous QHS  . polyethylene glycol  17 g Oral Daily  . rosuvastatin  10 mg Per Tube Daily  . sodium chloride flush  3 mL Intravenous Q12H   Continuous: . sodium chloride    . propofol (DIPRIVAN) infusion 35 mcg/kg/min (05/30/20 1100)    Assessment/Plan: Postop laryngospasm and respiratory distress. Found to have difficult airway and vocal cord edema. Currently intubated.  - On IV decadron Q6 hours. - Plan extubation and laryngoscopy in the OR. Informed consent obtained from husband.    LOS: 2 days   Shakendra Griffeth W Tyanne Derocher 05/30/2020, 12:15 PM

## 2020-05-30 NOTE — Progress Notes (Signed)
Awake, alert, conversant.  Mild sore throat.  Patient is doing well.  Discussed situation with patient and her husband.

## 2020-05-30 NOTE — Anesthesia Preprocedure Evaluation (Addendum)
Anesthesia Evaluation  Patient identified by MRN, date of birth, ID bandGeneral Assessment Comment:Patient sedated on vent  Reviewed: Allergy & Precautions, NPO status , Patient's Chart, lab work & pertinent test results  Airway Mallampati: Intubated       Dental  (+) Teeth Intact   Pulmonary former smoker,    breath sounds clear to auscultation       Cardiovascular  Rhythm:Regular     Neuro/Psych    GI/Hepatic   Endo/Other    Renal/GU      Musculoskeletal   Abdominal   Peds  Hematology   Anesthesia Other Findings   Reproductive/Obstetrics                            Anesthesia Physical Anesthesia Plan  ASA: III  Anesthesia Plan: General   Post-op Pain Management:    Induction: Intravenous  PONV Risk Score and Plan:   Airway Management Planned: Oral ETT  Additional Equipment:   Intra-op Plan:   Post-operative Plan: Extubation in OR  Informed Consent: I have reviewed the patients History and Physical, chart, labs and discussed the procedure including the risks, benefits and alternatives for the proposed anesthesia with the patient or authorized representative who has indicated his/her understanding and acceptance.       Plan Discussed with: CRNA and Anesthesiologist  Anesthesia Plan Comments:         Anesthesia Quick Evaluation

## 2020-05-30 NOTE — Op Note (Signed)
DATE OF PROCEDURE:  05/30/2020                              OPERATIVE REPORT  SURGEON:  Leta Baptist, MD  PREOPERATIVE DIAGNOSES: 1. Respiratory distress 2. Airway edema  POSTOPERATIVE DIAGNOSES: 1. Respiratory distress 2. Airway edema  PROCEDURE PERFORMED: Fiberoptic laryngoscopy and OR extubation  ANESTHESIA:  General endotracheal tube anesthesia.  COMPLICATIONS:  None.  ESTIMATED BLOOD LOSS:  Minimal.  INDICATION FOR PROCEDURE:  [name] is a 64 y.o. female who underwent cervical fusion surgery 2 days ago. The surgery was performed at the [facility]. After extubation, the patient had apparent laryngospasm and respiratory distress. She was transferred emergently to the [facility] ER by EMS. Multiple intubation attempts in the ER was unsuccessful. Her airway was anterior, with significant edema. She was eventually intubated by anesthesia and placed on ventilator for the past 2 days.  The edema has improved with IV steroid. Based on the above findings, the decision was made for the patient to undergo the extubation and laryngoscopy procedure in the OR.  The risks, benefits, alternatives, and details of the procedure were discussed with the husband.  Questions were invited and answered.  Informed consent was obtained.  DESCRIPTION:  The patient was taken to the operating room and placed supine on the operating table.  General endotracheal tube anesthesia was gradually weaned off by the anesthesiologist.  The patient was extubated without difficulty. The flexible scope was inserted into the right nasal cavity and advanced towards the nasopharynx.  Visualized mucosa over the turbinates and septum were congested.  The nasopharynx was clear.  Oropharyngeal walls were symmetric and mobile without lesion or mass.  Hypopharynx was also without lesion or edema.  Larynx was mildly edematous.  No lesions or asymmetry in the supraglottic larynx.  Arytenoid mucosa was edematous.  Posterior  commissure with edema and redundant mucosa.  True vocal folds were pale yellow and edematous but without mass or lesion.  The glottic opening is patent.  The care of the patient was turned over to the anesthesiologist.  The patient was transferred to the recovery room in good condition.  OPERATIVE FINDINGS: Mild to moderate laryngeal edema. Glottic opening is patent.  SPECIMEN:  None  FOLLOWUP CARE:  The patient will be returned to her ICU room.  [name] [name] 05/30/2020 12:34 PM

## 2020-05-30 NOTE — Transfer of Care (Signed)
Immediate Anesthesia Transfer of Care Note  Patient: [name]  Procedure(s) Performed: LARYNGOSCOPY (N/A Throat)  Patient Location: PACU  Anesthesia Type:General  Level of Consciousness: awake, oriented and patient cooperative  Airway & Oxygen Therapy: Patient Spontanous Breathing and Patient connected to face mask oxygen  Post-op Assessment: Report given to RN and Post -op Vital signs reviewed and stable  Post vital signs: Reviewed and stable  Last Vitals:  Vitals Value Taken Time  BP 124/68 05/30/20 1255  Temp 36.9 C 05/30/20 1240  Pulse 77 05/30/20 1258  Resp 19 05/30/20 1258  SpO2 97 % 05/30/20 1258  Vitals shown include unvalidated device data.  Last Pain:  Vitals:   05/30/20 1240  TempSrc:   PainSc: 5          Complications: No complications documented.

## 2020-05-30 NOTE — Progress Notes (Signed)
Orthopedic Tech Progress Note Patient Details:  [name] [birth year] [id number]  Ortho Devices Type of Ortho Device: Soft collar Ortho Device/Splint Interventions: Ordered, Application   Post Interventions Patient Tolerated: Well Instructions Provided: Adjustment of device, Care of device, Poper ambulation with device   [name] A Inge Waldroup 05/30/2020, 4:54 PM

## 2020-05-30 NOTE — Anesthesia Postprocedure Evaluation (Signed)
Anesthesia Post Note  Patient: [name]  Procedure(s) Performed: LARYNGOSCOPY (N/A Throat)     Patient location during evaluation: PACU Anesthesia Type: General Level of consciousness: awake and alert Pain management: pain level controlled Vital Signs Assessment: post-procedure vital signs reviewed and stable Respiratory status: spontaneous breathing, nonlabored ventilation, respiratory function stable and patient connected to nasal cannula oxygen Cardiovascular status: blood pressure returned to baseline and stable Postop Assessment: no apparent nausea or vomiting Anesthetic complications: no   No complications documented.  Last Vitals:  Vitals:   05/30/20 1530 05/30/20 1600  BP: 127/65 127/70  Pulse: 80 82  Resp: (!) 21 (!) 22  Temp:    SpO2: 94% 95%    Last Pain:  Vitals:   05/30/20 1509  TempSrc: Oral  PainSc:                  Keli Buehner COKER

## 2020-05-30 NOTE — Progress Notes (Signed)
PT Cancellation Note  Patient Details Name: [name] MRN: [id number] DOB: 12-05-1955   Cancelled Treatment:    Reason Eval/Treat Not Completed: Patient not medically ready (await extubation today to proceed with mobility)   Concepcion Kirkpatrick B Kimberley Speece 05/30/2020, 9:50 AM  [place] Pager: [phone] Office: [phone]

## 2020-05-30 NOTE — Progress Notes (Signed)
NAME:  [name], MRN:  [id number], DOB:  [birth year], LOS: 2 ADMISSION DATE:  05/28/2020, CONSULTATION DATE:  10/19 REFERRING MD:  Vertell Limber, CHIEF COMPLAINT:  dyspnea   Brief History   64 y/o female underwent an elective ACDF on 10/19 and post operatively developed the sensation of dyspnea and required emergent re-intubation in the [facility] emergency department by anesthesia who noted an anterior airway with cord edema.  She then underwent wound exploration and hematoma evacuation with a JP drain placed.    Past Medical History  Anxiety Aortic regurgitation Carpal tunnel CKD, uncertain stage GERD Hyperlipidemia Hypothyroidism Migraine headache Mitral Valve prolapse OSA on CPAP Single kidney  Significant Hospital Events   10/19 ACDF, emergent re-intubation 10/20 no cuff leak  Consults:  PCCM  Procedures:  10/19 ACDF 10/19 ETT 10/19 cervical wound exploration  Significant Diagnostic Tests:    Micro Data:  10/19 sars cov 2/flu > negative  Antimicrobials:  10/19 post op ancef   Interim history/subjective:   Has cuff leak Moving all four extremities   Objective   Blood pressure 106/72, pulse 65, temperature (!) 96.9 F (36.1 C), temperature source Axillary, resp. rate 18, height 5\' 4"  (1.626 m), weight 107.8 kg, SpO2 96 %.    Vent Mode: PRVC FiO2 (%):  [40 %-50 %] 40 % Set Rate:  [18 bmp] 18 bmp Vt Set:  [430 mL] 430 mL PEEP:  [5 cmH20] 5 cmH20 Plateau Pressure:  [18 cmH20] 18 cmH20   Intake/Output Summary (Last 24 hours) at 05/30/2020 0720 Last data filed at 05/30/2020 0600 Gross per 24 hour  Intake 1410.39 ml  Output 1060 ml  Net 350.39 ml   Filed Weights   05/28/20 2035 05/30/20 0400  Weight: 112 kg 107.8 kg    Examination:   General:  In bed on vent HENT: NCAT ETT in place, JP drain in place, dressing clean, dry, intact PULM: CTA B, vent supported breathing CV: RRR, no mgr GI: BS+, soft, nontender MSK: normal bulk and tone Neuro:  sedated on vent   10/19 CXR images personally reviewed> cardiomegaly, ett in place, cervical spine fusion plate noted  Resolved Hospital Problem list     Assessment & Plan:  Airway compromise after elective ACDF due to laryngeal edema complicated by C3 osteophyte and neck hematoma now evacuated > 10/21 has cuff leak > plan laryngoscopy in OR> extubation and laryngoscopy after > continue full vent support until extubation > vap prevention > hold lisinopril > decadron  Need for sedation for mechanical ventilation > PAD protocol > RASS goal -1 > propofol, prn versed/fentanyl  Single kidney > Monitor BMET and UOP > Replace electrolytes as needed  Hypothyroidism > synthroid  Hypertension > hold lisinopril for now (though suspect she could restart this in 2-4 weeks after discharge, this was not the culprit)  DM2 with hyperglycemia on steroids > SSI> change to resistant scale > hold tube feeding  Best practice:  Diet: start tube feeding Pain/Anxiety/Delirium protocol (if indicated): as above VAP protocol (if indicated): yes DVT prophylaxis: scd GI prophylaxis: Pantoprazole for stress ulcer prophylaxis Glucose control: SSI Mobility: bed rest Code Status: FULL Family Communication: daughter Hope updated bedside Disposition:   Labs   CBC: Recent Labs  Lab 05/28/20 1838 05/28/20 1859 05/28/20 2244 05/30/20 0635  WBC  --  24.3*  --  14.9*  NEUTROABS  --  19.8*  --   --   HGB 18.0* 16.1* 15.0 13.0  HCT 53.0* 53.6* 44.0 41.5  MCV  --  80.1  --  77.9*  PLT  --  676*  --  785    Basic Metabolic Panel: Recent Labs  Lab 05/28/20 1838 05/28/20 1859 05/28/20 2244 05/29/20 1450 05/29/20 1825 05/30/20 0635  NA 139 136 137  --   --  134*  K 3.6 3.5 3.5  --   --  3.9  CL 100 98  --   --   --  100  CO2  --  27  --   --   --  25  GLUCOSE 173* 172*  --   --   --  150*  BUN 14 13  --   --   --  20  CREATININE 1.00 1.20*  --   --   --  1.13*  CALCIUM  --  9.9  --    --   --  8.9  MG  --   --   --  1.7 1.5* 2.4  PHOS  --   --   --  3.3 3.7 2.9   GFR: Estimated Creatinine Clearance: 59.5 mL/min (A) (by C-G formula based on SCr of 1.13 mg/dL (H)). Recent Labs  Lab 05/28/20 1859 05/30/20 0635  WBC 24.3* 14.9*    Liver Function Tests: No results for input(s): AST, ALT, ALKPHOS, BILITOT, PROT, ALBUMIN in the last 168 hours. No results for input(s): LIPASE, AMYLASE in the last 168 hours. No results for input(s): AMMONIA in the last 168 hours.  ABG    Component Value Date/Time   PHART 7.391 05/28/2020 2244   PCO2ART 44.9 05/28/2020 2244   PO2ART 76 (L) 05/28/2020 2244   HCO3 27.4 05/28/2020 2244   TCO2 29 05/28/2020 2244   O2SAT 95.0 05/28/2020 2244     Coagulation Profile: Recent Labs  Lab 05/28/20 2124  INR 1.1    Cardiac Enzymes: No results for input(s): CKTOTAL, CKMB, CKMBINDEX, TROPONINI in the last 168 hours.  HbA1C: Hgb A1c MFr Bld  Date/Time Value Ref Range Status  04/11/2020 09:58 AM 6.1 (H) <5.7 % of total Hgb Final    Comment:    For someone without known diabetes, a hemoglobin  A1c value between 5.7% and 6.4% is consistent with prediabetes and should be confirmed with a  follow-up test. . For someone with known diabetes, a value <7% indicates that their diabetes is well controlled. A1c targets should be individualized based on duration of diabetes, age, comorbid conditions, and other considerations. . This assay result is consistent with an increased risk of diabetes. . Currently, no consensus exists regarding use of hemoglobin A1c for diagnosis of diabetes for children. .   07/12/2017 09:20 AM 5.7 (H) <5.7 % of total Hgb Final    Comment:    For someone without known diabetes, a hemoglobin  A1c value between 5.7% and 6.4% is consistent with prediabetes and should be confirmed with a  follow-up test. . For someone with known diabetes, a value <7% indicates that their diabetes is well controlled.  A1c targets should be individualized based on duration of diabetes, age, comorbid conditions, and other considerations. . This assay result is consistent with an increased risk of diabetes. . Currently, no consensus exists regarding use of hemoglobin A1c for diagnosis of diabetes for children. .     CBG: Recent Labs  Lab 05/29/20 1137 05/29/20 1533 05/29/20 1945 05/29/20 2316 05/30/20 0336  GLUCAP 145* 105* 129* 153* 163*     Critical care time: 31 minutes     Roselie Awkward, MD [place]  PCCM Pager: [phone] Cell: [phone] If no response, call [phone]

## 2020-05-30 NOTE — Progress Notes (Signed)
OT Cancellation Note  Patient Details Name: LORY NOWACZYK MRN: [id number] DOB: 01/18/1956   Cancelled Treatment:    Reason Eval/Treat Not Completed: Patient not medically ready-awaiting extubation to proceed with OT.  Will follow and see as able.   Jolaine Artist, OT Acute Rehabilitation Services Pager [phone] Office [phone]   Delight Stare 05/30/2020, 11:47 AM

## 2020-05-30 NOTE — Progress Notes (Signed)
Patient checked for cuff leak, cuff leak noted with no volume loss on the  vent. RN informed

## 2020-05-30 NOTE — Progress Notes (Addendum)
Subjective: Patient reports Vent, sedated  Objective: Vital signs in last 24 hours: Temp:  [96.9 F (36.1 C)-98.4 F (36.9 C)] 97.7 F (36.5 C) (10/21 0807) Pulse Rate:  [57-74] 65 (10/21 0930) Resp:  [16-19] 17 (10/21 0930) BP: (88-120)/(50-72) 103/53 (10/21 0930) SpO2:  [93 %-100 %] 98 % (10/21 0930) FiO2 (%):  [40 %-50 %] 40 % (10/21 0806) Weight:  [107.8 kg] 107.8 kg (10/21 0400)  Intake/Output from previous day: 10/20 0701 - 10/21 0700 In: 1473.9 [I.V.:963.8; NG/GT:360; IV Piggyback:150] Out: 1060 [Urine:1020; Drains:40] Intake/Output this shift: Total I/O In: 70.6 [I.V.:70.6] Out: 180 [Urine:180]  Resting quietly; sedated on vent. Husband present reports no events overnight. Planning for extubation in [place] today with Anesthesia and ENT. JP with minimal drainage last 24hrs (77ml)  Lab Results: Recent Labs    05/28/20 1859 05/28/20 1859 05/28/20 2244 05/30/20 0635  WBC 24.3*  --   --  14.9*  HGB 16.1*   < > 15.0 13.0  HCT 53.6*   < > 44.0 41.5  PLT 676*  --   --  376   < > = values in this interval not displayed.   BMET Recent Labs    05/28/20 1859 05/28/20 1859 05/28/20 2244 05/30/20 0635  NA 136   < > 137 134*  K 3.5   < > 3.5 3.9  CL 98  --   --  100  CO2 27  --   --  25  GLUCOSE 172*  --   --  150*  BUN 13  --   --  20  CREATININE 1.20*  --   --  1.13*  CALCIUM 9.9  --   --  8.9   < > = values in this interval not displayed.    Studies/Results: DG Abd 1 View  Result Date: 05/28/2020 CLINICAL DATA:  OG tube placement EXAM: ABDOMEN - 1 VIEW COMPARISON:  CT 05/23/2019 FINDINGS: Esophageal tube tip and side port overlie the proximal stomach. There is moderate gaseous dilatation of the stomach. There is consolidation at the left lung base. IMPRESSION: Esophageal tube tip overlies the proximal stomach. Moderate gaseous dilatation of the stomach. Electronically Signed   By: Donavan Foil M.D.   On: 05/28/2020 20:47   DG CHEST PORT 1 VIEW  Result Date:  05/30/2020 CLINICAL DATA:  Acute respiratory failure with hypoxemia. EXAM: PORTABLE CHEST 1 VIEW COMPARISON:  05/28/2020 FINDINGS: Endotracheal tube and enteric tube are unchanged in position. Shallow inspiration. Cardiac enlargement. Probable consolidation or atelectasis in the left lung base behind the heart. Infiltration in the right lung base is unchanged. Small left pleural effusion. IMPRESSION: Appliances are unchanged in position. Cardiac enlargement. Consolidation or atelectasis in the left lung base behind the heart with small left pleural effusion. Infiltration in the right lung base is unchanged. Electronically Signed   By: Lucienne Capers M.D.   On: 05/30/2020 04:41   DG Chest Port 1 View  Result Date: 05/28/2020 CLINICAL DATA:  Intubation EXAM: PORTABLE CHEST 1 VIEW COMPARISON:  09/07/2013 FINDINGS: Endotracheal tube is 4 cm above the carina. Mild cardiomegaly. Bilateral lower lobe airspace opacities. Small left pleural effusion. No pneumothorax or acute bony abnormality. NG tube is in the stomach. IMPRESSION: Bilateral lower lobe airspace opacities, left greater than right concerning for pneumonia. Small left pleural effusion. Mild cardiomegaly. Electronically Signed   By: Rolm Baptise M.D.   On: 05/28/2020 20:47    Assessment/Plan:   LOS: 2 days  Supportive care continues with plans  to extubae in [place] today.   [name] 05/30/2020, 10:12 AM   I am grateful for CCM and ENT help.

## 2020-05-30 NOTE — Progress Notes (Signed)
[facility] Progress Note Patient Name: [name] DOB: June 04, 1956 MRN: [id number]   Date of Service  05/30/2020  HPI/Events of Note  Patient asking if she can use her own home CPAP machine.  eICU Interventions  Order entered permitting patient to use her own CPAP machine when it becomes available to her.        [name] Kass [name] 05/30/2020, 9:25 PM

## 2020-05-31 ENCOUNTER — Encounter (HOSPITAL_COMMUNITY): Payer: Self-pay | Admitting: Otolaryngology

## 2020-05-31 LAB — COMPREHENSIVE METABOLIC PANEL
ALT: 22 U/L (ref 0–44)
AST: 32 U/L (ref 15–41)
Albumin: 3.1 g/dL — ABNORMAL LOW (ref 3.5–5.0)
Alkaline Phosphatase: 50 U/L (ref 38–126)
Anion gap: 7 (ref 5–15)
BUN: 22 mg/dL (ref 8–23)
CO2: 25 mmol/L (ref 22–32)
Calcium: 8.9 mg/dL (ref 8.9–10.3)
Chloride: 106 mmol/L (ref 98–111)
Creatinine, Ser: 1.1 mg/dL — ABNORMAL HIGH (ref 0.44–1.00)
GFR, Estimated: 56 mL/min — ABNORMAL LOW (ref >60)
Glucose, Bld: 138 mg/dL — ABNORMAL HIGH (ref 70–99)
Potassium: 4.7 mmol/L (ref 3.5–5.1)
Sodium: 138 mmol/L (ref 135–145)
Total Bilirubin: 1.1 mg/dL (ref 0.3–1.2)
Total Protein: 5.4 g/dL — ABNORMAL LOW (ref 6.5–8.1)

## 2020-05-31 LAB — CBC
HCT: 41.8 % (ref 36.0–46.0)
Hemoglobin: 13.1 g/dL (ref 12.0–15.0)
MCH: 24.5 pg — ABNORMAL LOW (ref 26.0–34.0)
MCHC: 31.3 g/dL (ref 30.0–36.0)
MCV: 78.3 fL — ABNORMAL LOW (ref 80.0–100.0)
Platelets: 422 10*3/uL — ABNORMAL HIGH (ref 150–400)
RBC: 5.34 MIL/uL — ABNORMAL HIGH (ref 3.87–5.11)
RDW: 18.4 % — ABNORMAL HIGH (ref 11.5–15.5)
WBC: 15.9 10*3/uL — ABNORMAL HIGH (ref 4.0–10.5)
nRBC: 0 % (ref 0.0–0.2)

## 2020-05-31 LAB — TRYPTASE: Tryptase: 5.2 ug/L (ref 2.2–13.2)

## 2020-05-31 LAB — GLUCOSE, CAPILLARY
Glucose-Capillary: 143 mg/dL — ABNORMAL HIGH (ref 70–99)
Glucose-Capillary: 130 mg/dL — ABNORMAL HIGH (ref 70–99)
Glucose-Capillary: 105 mg/dL — ABNORMAL HIGH (ref 70–99)

## 2020-05-31 LAB — C4 COMPLEMENT: Complement C4, Body Fluid: 28 mg/dL (ref 12–38)

## 2020-05-31 MED ORDER — DEXAMETHASONE 4 MG PO TABS
4.0000 mg | ORAL_TABLET | Freq: Four times a day (QID) | ORAL | Status: DC
  Administered 2020-05-31: 4 mg via ORAL
  Filled 2020-05-31 (×2): qty 1

## 2020-05-31 MED ORDER — ACETAMINOPHEN 325 MG PO TABS: 650.0000 mg | ORAL_TABLET | ORAL | Status: DC | PRN

## 2020-05-31 MED ORDER — LORATADINE 10 MG PO TABS: 10.0000 mg | ORAL_TABLET | Freq: Every day | ORAL | Status: DC

## 2020-05-31 MED ORDER — ACETAMINOPHEN 650 MG RE SUPP: 650.0000 mg | RECTAL | Status: DC | PRN

## 2020-05-31 MED ORDER — HYDROCHLOROTHIAZIDE 10 MG/ML ORAL SUSPENSION: 12.5000 mg | Freq: Every day | ORAL | Status: DC

## 2020-05-31 MED ORDER — DEXAMETHASONE SODIUM PHOSPHATE 4 MG/ML IJ SOLN: 4.0000 mg | Freq: Four times a day (QID) | INTRAMUSCULAR | Status: DC

## 2020-05-31 MED ORDER — CITALOPRAM HYDROBROMIDE 10 MG PO TABS: 10.0000 mg | ORAL_TABLET | Freq: Every day | ORAL | Status: DC

## 2020-05-31 MED ORDER — ROSUVASTATIN CALCIUM 5 MG PO TABS: 10.0000 mg | ORAL_TABLET | Freq: Every day | ORAL | Status: DC

## 2020-05-31 MED ORDER — FERROUS SULFATE 300 (60 FE) MG/5ML PO SYRP: 300.0000 mg | ORAL_SOLUTION | Freq: Every day | ORAL | Status: DC

## 2020-05-31 NOTE — Progress Notes (Signed)
[facility] PCCM  Doing well this morning  Breathing comfortably No stridor Moving all four extremities well Drain removed For PT eval and SLP eval, hopeful for discharge today  PCCM will sign off  Roselie Awkward, MD [place] PCCM Pager: [phone] Cell: [phone] If no response, call [phone]

## 2020-05-31 NOTE — Progress Notes (Signed)
Subjective: No issues overnight. Resting comfortably in bed.  Objective: Vital signs in last 24 hours: Temp:  [97.7 F (36.5 C)-98.5 F (36.9 C)] 97.9 F (36.6 C) (10/22 0803) Pulse Rate:  [58-91] 67 (10/22 0800) Resp:  [13-25] 20 (10/22 0800) BP: (98-131)/(50-80) 114/62 (10/22 0800) SpO2:  [91 %-100 %] 93 % (10/22 0800) FiO2 (%):  [40 %] 40 % (10/21 1137)  Physical Exam: General appearance:Resting in bed. A&O x3. Head:Normocephalic, without obvious abnormality, atraumatic Ears: Examination of the ears shows normal auricles and external auditory canals bilaterally.  Nose: Nasal examination shows normal mucosa, septum, turbinates.  Face: Facial examination shows no asymmetry.  Mouth: No mucosal abnormality. Voice is strong. Neck:Left neck dressing in place.  No stridor. Neuro:A&O x3.   Recent Labs    05/30/20 0635 05/31/20 0148  WBC 14.9* 15.9*  HGB 13.0 13.1  HCT 41.5 41.8  PLT 376 422*   Recent Labs    05/30/20 0635 05/31/20 0148  NA 134* 138  K 3.9 4.7  CL 100 106  CO2 25 25  GLUCOSE 150* 138*  BUN 20 22  CREATININE 1.13* 1.10*  CALCIUM 8.9 8.9    Medications:  I have reviewed the patient's current medications. Scheduled: . chlorhexidine gluconate (MEDLINE KIT)  15 mL Mouth Rinse BID  . Chlorhexidine Gluconate Cloth  6 each Topical Daily  . cholecalciferol  3,000 Units Oral Once per day on Wed Sat  . citalopram  10 mg Per Tube Daily  . dexamethasone  4 mg Per Tube Q6H   Or  . dexamethasone (DECADRON) injection  4 mg Intravenous Q6H  . docusate  100 mg Oral BID  . ferrous sulfate  300 mg Per Tube Q breakfast  . hydrochlorothiazide  13 mg Per Tube Daily  . insulin aspart  0-20 Units Subcutaneous Q4H  . levothyroxine  125 mcg Per Tube Q0600  . loratadine  10 mg Per Tube Daily  . mouth rinse  15 mL Mouth Rinse q12n4p  . pantoprazole (PROTONIX) IV  40 mg Intravenous QHS  . polyethylene glycol  17 g Oral Daily  . rosuvastatin  10 mg Per Tube Daily   . sodium chloride flush  3 mL Intravenous Q12H   Continuous: . sodium chloride    . dextrose 5 % and 0.45 % NaCl with KCl 20 mEq/L 75 mL/hr at 05/31/20 0800    Assessment/Plan: Pt's postop laryngospasm and respiratory distress have resolved. Doing well s/p extubation yesterday. - Only mild laryngeal edema was noted on yesterday's laryngoscopy exam. - Will sign off. Please call with questions or concerns.   LOS: 3 days   Lucille Crichlow W Ebelyn Bohnet 05/31/2020, 8:38 AM

## 2020-05-31 NOTE — Evaluation (Signed)
Occupational Therapy Evaluation and Discharge Patient Details Name: [name] MRN: [id number] DOB: [birth year] Today's Date: 05/31/2020    History of Present Illness 64 yo admitted 10/19 after cervical fusion C 6-7 at OP Sx center with laryngospasm and airway compromise post extubation with transfer to ED and intubation by anesthesia. Extubated 10/21. PMhx: mitral valve prolapse, GERd, HTN, insomnia, anxiety, hypothyroidism, Rt TKA, HLD   Clinical Impression   This 64 yo female admitted and underwent above presents to acute OT with all education completed, we will D/C from acute OT.    Follow Up Recommendations  No OT follow up;Supervision - Intermittent    Equipment Recommendations  None recommended by OT       Precautions / Restrictions Precautions Precautions: Cervical Precaution Booklet Issued: Yes (comment) Restrictions Weight Bearing Restrictions: No      Mobility Bed Mobility Overal bed mobility: Modified Independent             General bed mobility comments: Pt up in recliner upon my arrival    Transfers Overall transfer level: Modified independent               General transfer comment: ambulated around the unit without AD--reported not as fatiguing as the first time around earlier today    Balance Overall balance assessment: No apparent balance deficits (not formally assessed)                                         ADL either performed or assessed with clinical judgement   ADL                                         General ADL Comments: Gave pt handout on post op cervical care. Educated her on not bending over or reaching up high (above shoulder height) thus putting stress on her neck. We talked about using cups with straws only so as to avoid extending the neck to get the last sips out, using a cup with straw for rinse cup with brushing teeth and then having a spit cup to to avoid bending over the sink. I  demonstrated to her the 1st stance in ballet (plie) to help with sit<>stand so she does not bend over too much with sit<>stand. We also talked about since she does not have as hand held shower she needs to make sure she gets directly under the water to get wet and rinsed off instead of leaning into the water. Crossing her legs one over the other to get to feet for socks; wearing v-neck, boat neck or button up shirts.     Vision Patient Visual Report: No change from baseline              Pertinent Vitals/Pain Pain Assessment: 0-10 Pain Score: 2  Faces Pain Scale: Hurts a little bit Pain Location: throat Pain Descriptors / Indicators: Sore Pain Intervention(s): Monitored during session (pt used throat spray)     Hand Dominance Right   Extremity/Trunk Assessment Upper Extremity Assessment Upper Extremity Assessment: Overall WFL for tasks assessed     Communication Communication Communication: No difficulties   Cognition Arousal/Alertness: Awake/alert Behavior During Therapy: WFL for tasks assessed/performed Overall Cognitive Status: Within Functional Limits for tasks assessed  Home Living Family/patient expects to be discharged to:: Private residence Living Arrangements: Spouse/significant other Available Help at Discharge: Family;Available 24 hours/day Type of Home: House Home Access: Stairs to enter [employer] of Steps: 6 Entrance Stairs-Rails: Can reach both Home Layout: Two level Alternate Level Stairs-Number of Steps: flight Alternate Level Stairs-Rails: Right;Left Bathroom Shower/Tub: Occupational psychologist: Handicapped height     Home Equipment: Environmental consultant - 2 wheels          Prior Functioning/Environment Level of Independence: Independent                 OT Problem List: Pain         OT Goals(Current goals can be found in the care plan section) Acute Rehab OT  Goals Patient Stated Goal: to go home today  OT Frequency:                AM-PAC OT "6 Clicks" Daily Activity     Outcome Measure Help from another person eating meals?: None Help from another person taking care of personal grooming?: None Help from another person toileting, which includes using toliet, bedpan, or urinal?: None Help from another person bathing (including washing, rinsing, drying)?: None Help from another person to put on and taking off regular upper body clothing?: None Help from another person to put on and taking off regular lower body clothing?: None 6 Click Score: 24   End of Session Nurse Communication:  (RN saw her ambulating in hallway)  Activity Tolerance: Patient tolerated treatment well Patient left: in chair;with call bell/phone within reach;with family/visitor present  OT Visit Diagnosis: Muscle weakness (generalized) (M62.81);Pain Pain - part of body:  (throat)                Time: 3254-9826 OT Time Calculation (min): 22 min Charges:  OT General Charges $OT Visit: 1 Visit OT Evaluation $OT Eval Moderate Complexity: 1 Mod  [place], OTR/L Acute [employer] Pager [phone] Office [phone]     Almon Register 05/31/2020, 11:50 AM

## 2020-05-31 NOTE — Progress Notes (Signed)
Subjective: The patient reports that she is continuing to have a sore throat and appropriate incisional pain. No acute events were reported overnight.  She is ambulating well and feels that she is ready for discharge.  Objective: Vital signs in last 24 hours: Temp:  [97.7 F (36.5 C)-98 F (36.7 C)] 98 F (36.7 C) (10/22 1130) Pulse Rate:  [59-91] 65 (10/22 1300) Resp:  [13-25] 19 (10/22 1300) BP: (100-131)/(50-78) 109/78 (10/22 1300) SpO2:  [91 %-98 %] 94 % (10/22 1300)  Intake/Output from previous day: 10/21 0701 - 10/22 0700 In: 999.8 [I.V.:999.8] Out: 2620 [Urine:2600; Drains:20] Intake/Output this shift: Total I/O In: 75 [I.V.:75] Out: 140 [Urine:140]  Physical Exam: She is A/O X4, conversant, and in good spirits. Reports sore throat and appropriate incisional pain. MAEW with good strength.  Drain has been removed.  Hard cervical collar in use.  Dressing is clean dry intact.  Incision is well approximated with no erythema, drainage, or edema.  Lab Results: Recent Labs    05/30/20 0635 05/31/20 0148  WBC 14.9* 15.9*  HGB 13.0 13.1  HCT 41.5 41.8  PLT 376 422*   BMET Recent Labs    05/30/20 0635 05/31/20 0148  NA 134* 138  K 3.9 4.7  CL 100 106  CO2 25 25  GLUCOSE 150* 138*  BUN 20 22  CREATININE 1.13* 1.10*  CALCIUM 8.9 8.9    Studies/Results: DG CHEST PORT 1 VIEW  Result Date: 05/30/2020 CLINICAL DATA:  Acute respiratory failure with hypoxemia. EXAM: PORTABLE CHEST 1 VIEW COMPARISON:  05/28/2020 FINDINGS: Endotracheal tube and enteric tube are unchanged in position. Shallow inspiration. Cardiac enlargement. Probable consolidation or atelectasis in the left lung base behind the heart. Infiltration in the right lung base is unchanged. Small left pleural effusion. IMPRESSION: Appliances are unchanged in position. Cardiac enlargement. Consolidation or atelectasis in the left lung base behind the heart with small left pleural effusion. Infiltration in the right  lung base is unchanged. Electronically Signed   By: Lucienne Capers M.D.   On: 05/30/2020 04:41    Assessment/Plan: The patient is S/P anterior cervical wound exploration on 05/28/2020.  She underwent a C6-C7 anterior cervical decompression and fusion at an outpatient facility when she began having respiratory distress due to laryngeal edema complicated by C3 osteophyte and neck hematoma.  She was emergently taken to surgery word anterior cervical wound exploration was performed, hematoma evacuated, and JP drain placement.  She was then taken to the medical ICU.  After resting overnight on the ventilator, she was extubated in the OR with anesthesia and ENT present.  She was assessed by PT, OT, and SLP. JP drain was removed. She continues recover in the medical ICU and is now doing much better and feels that she is ready for discharge.  Continue hard cervical collar.  She will follow-up in the clinic in 3 weeks.   LOS: 3 days     Marvis Moeller, DNP, NP-C 05/31/2020, 1:17 PM

## 2020-05-31 NOTE — Progress Notes (Signed)
Pt walked one lap around unit w/ PT, no assistance needed, tolerated well.

## 2020-05-31 NOTE — Progress Notes (Signed)
Discharge summary, instructions, meds reviewed w/ pt and pts husband. Pt states no questions or concerns. Pt being discharged to home. Taken out by wheelchair.

## 2020-05-31 NOTE — Discharge Summary (Signed)
Physician Discharge Summary  Patient ID: [name] MRN: [id number] DOB/AGE: [birth year] 64 y.o.  Admit date: 05/28/2020 Discharge date: 05/31/2020  Admission Diagnoses:Laryngospasm and Neck Hematoma  Discharge Diagnoses: Laryngospasm and Neck Hematoma Active Problems:   Airway compromise   Laryngospasm   Discharged Condition: good  Hospital Course: The patient is S/P anterior cervical wound exploration on 05/28/2020.  She underwent a C6-C7 anterior cervical decompression and fusion at an outpatient facility when she began having respiratory distress due to laryngeal edema complicated by C3 osteophyte and neck hematoma.  She was emergently taken to surgery where an anterior cervical wound exploration was performed, hematoma evacuated, and JP drain placement.  She was then taken to the medical ICU.  After resting overnight on the ventilator, she was extubated in the OR with anesthesia and ENT present.  She wonder went evaluation from physical therapy, occupational therapy, and speech language pathology.  Her wound remained clean dry and intact.  She has appropriate neck and throat soreness.  She has no complaints of new numbness, tingling, or weakness.  She has remained afebrile with stable vital signs and is tolerating a regular diet.  Foley wrist has been removed and she has urinated multiple times.  She has continued to increase her activities, and pain is well controlled with oral pain medications. She has continued recover well in the medical ICU and is now appropriate for discharge.    Consults: Critical Care Medicine, ENT  Significant Diagnostic Studies: None  Treatments: surgery: Anterior cervical wound exploration  Discharge Exam: Blood pressure 109/78, pulse 65, temperature 98 F (36.7 C), temperature source Oral, resp. rate 19, height 5\' 4"  (1.626 m), weight 107.8 kg, SpO2 94 %.  Physical Exam: Physical Exam: She is A/O X4, conversant, and in good spirits. Reports sore  throat and appropriate incisional pain. MAEW with good strength.  Drain has been removed.  Hard cervical collar in use.  Dressing is clean dry intact.  Incision is well approximated with no erythema, drainage, or edema.  Disposition: Discharge disposition: 01-Home or Self Care       Allergies as of 05/31/2020      Reactions   Percocet [oxycodone-acetaminophen] Other (See Comments)   SYNCOPE TACHYCARDIA   Shrimp [shellfish Allergy] Anaphylaxis, Itching, Nausea And Vomiting, Swelling   Hydrocodone-acetaminophen Nausea And Vomiting, Other (See Comments)   Dizziness, also   Lipitor [atorvastatin] Other (See Comments)   Ear pain/fullness in ear "as if I were under water"   Saxenda [liraglutide -weight Management] Hives, Itching, Other (See Comments)   Hives/itching/site reaction   Adhesive [tape] Rash, Other (See Comments)   EKG leads and certain bandages break out the skin   Belviq Xr [lorcaserin Hcl Er] Other (See Comments)   Gained weight    Morphine And Related Nausea And Vomiting          Signed: Marvis Moeller, DNP, NP-C 05/31/2020, 1:18 PM

## 2020-05-31 NOTE — Evaluation (Signed)
Physical Therapy Evaluation Patient Details Name: [name] MRN: [id number] DOB: May 13, 1956 Today's Date: 05/31/2020   History of Present Illness  64 yo admitted 10/19 after cervical fusion at OP Sx center with laryngospasm and airway compromise post extubation with transfer to ED and intubation by anesthesia. Extubated 10/21. PMhx: mitral valve prolapse, GERd, HTN, insomnia, anxiety, hypothyroidism, Rt TKA, HLD  Clinical Impression  Pt very pleasant reports no pain and improved RUE function since surgery. Pt maintaining sats >94% on RA with activity. Pt educated for cervical restrictions, transfers, gait and progression with pt and spouse able to verbalize understanding. Pt reports sufficient assist at home and understanding of all education. No further acute needs with pt encouraged to continue gait acutely.      Follow Up Recommendations No PT follow up    Equipment Recommendations  None recommended by PT    Recommendations for Other Services       Precautions / Restrictions Precautions Precautions: Cervical Restrictions Weight Bearing Restrictions: No      Mobility  Bed Mobility Overal bed mobility: Modified Independent             General bed mobility comments: rolling to left and side to sit    Transfers Overall transfer level: Modified independent                  Ambulation/Gait Ambulation/Gait assistance: Supervision Gait Distance (Feet): 150 Feet Assistive device: None Gait Pattern/deviations: Step-through pattern;Decreased stride length   Gait velocity interpretation: >2.62 ft/sec, indicative of community ambulatory General Gait Details: pt with slight unsteadiness with initial gait but able to maintain balance without assist. Pt fatigued easily with gait and declined attempting stairs at this time  Stairs            Wheelchair Mobility    Modified Rankin (Stroke Patients Only)       Balance Overall balance assessment: Mild  deficits observed, not formally tested                                           Pertinent Vitals/Pain Pain Assessment: No/denies pain Faces Pain Scale: Hurts a little bit Pain Intervention(s): Monitored during session    Home Living Family/patient expects to be discharged to:: Private residence Living Arrangements: Spouse/significant other Available Help at Discharge: Family;Available 24 hours/day Type of Home: House Home Access: Stairs to enter Entrance Stairs-Rails: Can reach both Entrance Stairs-Number of Steps: 6 Home Layout: Two level Home Equipment: Walker - 2 wheels      Prior Function Level of Independence: Independent               Hand Dominance        Extremity/Trunk Assessment   Upper Extremity Assessment Upper Extremity Assessment: Defer to OT evaluation    Lower Extremity Assessment Lower Extremity Assessment: Overall WFL for tasks assessed    Cervical / Trunk Assessment Cervical / Trunk Assessment: Normal  Communication   Communication: No difficulties  Cognition Arousal/Alertness: Awake/alert Behavior During Therapy: WFL for tasks assessed/performed Overall Cognitive Status: Within Functional Limits for tasks assessed                                        General Comments      Exercises     Assessment/Plan  PT Assessment Patent does not need any further PT services  PT Problem List         PT Treatment Interventions      PT Goals (Current goals can be found in the Care Plan section)  Acute Rehab PT Goals PT Goal Formulation: All assessment and education complete, DC therapy    Frequency     Barriers to discharge        Co-evaluation               AM-PAC PT "6 Clicks" Mobility  Outcome Measure Help needed turning from your back to your side while in a flat bed without using bedrails?: None Help needed moving from lying on your back to sitting on the side of a flat bed without  using bedrails?: None Help needed moving to and from a bed to a chair (including a wheelchair)?: None Help needed standing up from a chair using your arms (e.g., wheelchair or bedside chair)?: None Help needed to walk in hospital room?: A Little Help needed climbing 3-5 steps with a railing? : A Little 6 Click Score: 22    End of Session   Activity Tolerance: Patient tolerated treatment well Patient left: in chair;with call bell/phone within reach;with family/visitor present Nurse Communication: Mobility status PT Visit Diagnosis: Other abnormalities of gait and mobility (R26.89)    Time: 6681-5947 PT Time Calculation (min) (ACUTE ONLY): 16 min   Charges:   PT Evaluation $PT Eval Low Complexity: [place], PT Acute Rehabilitation Services Pager: [phone] Office: [place] 05/31/2020, 10:23 AM

## 2020-05-31 NOTE — TOC Transition Note (Signed)
Transition of Care [facility]) - CM/SW Discharge Note   Patient Details  Name: [name] MRN: [id number] Date of Birth: 07-14-1956  [facility]) CM/SW Contact:  Pollie Friar, RN Phone Number: 05/31/2020, 2:25 PM   Clinical Narrative:    Pt is discharging home with self care. No f/u per PT/OT and no DME needs.  Pt has transportation home.   Final next level of care: Home/Self Care Barriers to Discharge: No Barriers Identified   Patient Goals and CMS Choice        Discharge Placement                       Discharge Plan and Services                                     Social Determinants of Health (SDOH) Interventions     Readmission Risk Interventions No flowsheet data found.

## 2020-06-03 ENCOUNTER — Encounter: Payer: Self-pay | Admitting: *Deleted

## 2020-06-03 ENCOUNTER — Telehealth: Payer: Self-pay | Admitting: Physician Assistant

## 2020-06-03 ENCOUNTER — Other Ambulatory Visit: Payer: Self-pay | Admitting: *Deleted

## 2020-06-03 ENCOUNTER — Encounter: Payer: Self-pay | Admitting: Physician Assistant

## 2020-06-03 DIAGNOSIS — Z981 Arthrodesis status: Secondary | ICD-10-CM

## 2020-06-03 DIAGNOSIS — R059 Cough, unspecified: Secondary | ICD-10-CM

## 2020-06-03 DIAGNOSIS — R0602 Shortness of breath: Secondary | ICD-10-CM

## 2020-06-03 NOTE — Telephone Encounter (Signed)
MyChart inquiry from patient: Respiratory status.  Thank you.

## 2020-06-03 NOTE — Patient Outreach (Signed)
[facility] Care Management  06/03/2020  [name] 1955/11/28 [id number]  Transition of care call/case closure   Referral received:05/29/20 Initial outreach:06/03/20 Insurance: [place] [employer]    Subjective: Initial successful telephone call to patient's preferred number in order to complete transition of care assessment; 2 HIPAA identifiers verified. Explained purpose of call and completed transition of care assessment.  [name] states that she is feeling run down after recent hospital admission . She discussed developing respiratory distress , laryngospasm after outpatient anterior cervical decompression surgery. She denies  says surgical incisions are unremarkable, no sign /symptoms of infection , states surgical pain well managed with prescribed medications . She denies shortness of breath,little cough, she report having a low grade fever and has contacted PCP regarding getting a CXR. She reports having incentive spirometry and continuing to use.  She still has  sore throat ,with some difficult with  swallowing but is getting better. She states that she passed her swallowing study, tolerating soft diet. She  denies bowel or bladder problems. She reports tolerating mobility in home.   Spouse/children are assisting with her recovery.   Reviewed accessing the following [place] Benefits : She discussed  ongoing health issues of hyperlipidemia ,and is enrolled in  [employer] chronic disease management programs.  She doe not have the hospital indemnity plan  She  uses a [facility] outpatient pharmacy [facility]      Objective:  Mrs.[name] was hospitalized at [facility]  from 10/19-10/22 for Laryngospasm and neck hematoma. Comorbidities include: C6-C7 anterior cervical decompression and fusion at an outpatient facility, hyperlipidemia, hypertension ,   She was discharged to home on 05/31/20 without the need for home health services or DME.    Assessment:  Patient voices good understanding of all discharge instructions.  See transition of care flowsheet for assessment details.   Plan:  Reviewed hospital discharge diagnosis of Laryngospasm and neck hematoma  anterior cervical wound exploration and discharge treatment plan using hospital discharge instructions, assessing medication adherence, reviewing problems requiring provider notification, and discussing the importance of follow up with surgeon, primary care provider and/or specialists as directed.  Reviewed [place] healthy lifestyle program information to receive discounted premium for  2023  Step 1: Get  your annual physical  Step 2: Complete your health assessment  Step 3:Identify your current health status and complete the corresponding action step between January 1, and April 10, 2021. Discussed deadline for enrolling in benefits for 2022 October 29.    Using [place] website, verified that patient is an active participate in [employer] [employer] chronic disease management program.    No ongoing care management needs identified so will close case to [place] Management services and route successful outreach letter with [place] Management pamphlet and 24 Hour Nurse Line Magnet to [place] [facility] [facility] clinical pool to be mailed to patient's home address.  Thanked patient for their services to [facility].  Joylene Draft, RN, BSN  Power Management Coordinator  [phone]- Mobile [phone]- Toll Free Main Office

## 2020-06-03 NOTE — Telephone Encounter (Signed)
I don't have anything from her only status updates from when she was in the hospital and discharged. Can we call her and see what she needs?

## 2020-06-03 NOTE — Telephone Encounter (Signed)
I don't see a mychart message from patient in the chart?

## 2020-06-04 ENCOUNTER — Other Ambulatory Visit: Payer: Self-pay

## 2020-06-04 ENCOUNTER — Ambulatory Visit (INDEPENDENT_AMBULATORY_CARE_PROVIDER_SITE_OTHER): Payer: 59 | Admitting: Physician Assistant

## 2020-06-04 ENCOUNTER — Ambulatory Visit (INDEPENDENT_AMBULATORY_CARE_PROVIDER_SITE_OTHER): Payer: 59

## 2020-06-04 ENCOUNTER — Encounter: Payer: Self-pay | Admitting: Physician Assistant

## 2020-06-04 VITALS — BP 117/86 | HR 77 | Temp 97.9°F | Ht 64.0 in | Wt 219.0 lb

## 2020-06-04 DIAGNOSIS — Z981 Arthrodesis status: Secondary | ICD-10-CM | POA: Diagnosis not present

## 2020-06-04 DIAGNOSIS — R0602 Shortness of breath: Secondary | ICD-10-CM | POA: Diagnosis not present

## 2020-06-04 DIAGNOSIS — Z8709 Personal history of other diseases of the respiratory system: Secondary | ICD-10-CM | POA: Diagnosis not present

## 2020-06-04 DIAGNOSIS — J9811 Atelectasis: Secondary | ICD-10-CM | POA: Insufficient documentation

## 2020-06-04 DIAGNOSIS — R059 Cough, unspecified: Secondary | ICD-10-CM | POA: Diagnosis not present

## 2020-06-04 NOTE — Progress Notes (Signed)
Subjective:    Patient ID: [name], female    DOB: 18-Aug-1955, 64 y.o.   MRN: [id number]  HPI  Patient is a 64 year old female who presents to the clinic to follow-up after 05/28/2020 cervical fusion with complication of laryngospasm and hospital admission.  Patient did end up having a hematoma but suspected the spasm was likely due to medications given in post surgery recovery room.  Patient did have to be intubated and was in ICU for 2 days.  She denies any fever, chills, nausea right now.  She is on Flexeril and Vicodin for her pain.  She is having a slightly productive cough and some feelings like she cannot get a full breath.  She just wants to make sure everything in her chest looks all right.  She is using the incentive spirometry.  Follow-up with surgeon in 1 week.  .. Active Ambulatory Problems    Diagnosis Date Noted   UTI (urinary tract infection) 07/14/2012   Other and unspecified hyperlipidemia 07/14/2012   MVP (mitral valve prolapse) 07/14/2012   GERD (gastroesophageal reflux disease) 07/14/2012   Allergic rhinitis 07/14/2012   Mitral regurgitation 07/14/2012   S/P hysterectomy 07/14/2012   Essential hypertension, benign 04/29/2013   Wheezing 04/29/2013   Insomnia 04/29/2013   Unspecified vitamin D deficiency 04/29/2013   Migraine headache 04/29/2013   Anxiety state 04/29/2013   Unspecified hypothyroidism 04/29/2013   Obesity (BMI 30-39.9) 07/17/2013   Cough 08/15/2013   Arthritis of left knee 09/14/2013   Status post total knee replacement 09/14/2013   Abdominal pain, chronic, right upper quadrant 11/16/2013   Candidiasis of female genitalia 11/16/2013   Elevated liver enzymes 62/94/7654   Folliculitis 65/10/5463   Nausea alone 11/18/2013   Abnormal transaminases 11/18/2013   Hyperlipidemia 11/22/2014   Single kidney 11/22/2014   Hypothyroidism 11/22/2014   OSA (obstructive sleep apnea) 12/23/2015   Daytime sleepiness  01/24/2017   Carpal tunnel syndrome, left upper limb 02/25/2017   Status post carpal tunnel release 03/11/2017   Hypertriglyceridemia 07/14/2017   Elevated fasting glucose 07/14/2017   Chronic pain of right knee 09/13/2017   Unilateral primary osteoarthritis, right knee 09/27/2017   Pre-diabetes 10/19/2017   Pain in thumb joint with movement of right hand 10/19/2017   Class 2 obesity due to excess calories without serious comorbidity with body mass index (BMI) of 35.0 to 35.9 in adult 10/19/2017   Other meniscus derangements, posterior horn of medial meniscus, right knee 11/24/2017   Pain of right thumb 11/24/2017   Trigger thumb, right thumb 11/24/2017   BPPV (benign paroxysmal positional vertigo), right 02/20/2018   Concussion wth loss of consciousness of 30 minutes or less 02/20/2018   Fall 02/20/2018   CKD (chronic kidney disease) stage 3, GFR 30-59 ml/min (HCC) 03/10/2019   BPPV (benign paroxysmal positional vertigo), left 05/22/2019   Pain radiating to back 05/23/2019   Elevated lipase 06/12/2019   Seborrheic keratoses 01/05/2020   Papules 01/05/2020   Class 2 obesity due to excess calories without serious comorbidity with body mass index (BMI) of 38.0 to 38.9 in adult 01/05/2020   Anxiety and depression 06/03/2014   Fracture of triquetral bone of wrist 02/21/2018   Primary osteoarthritis involving multiple joints 06/03/2014   Degenerative disc disease, cervical 04/11/2020   Right arm pain 04/16/2020   Right arm weakness 04/16/2020   Iron deficiency 04/17/2020   Airway compromise 05/28/2020   Laryngospasm 05/28/2020   Status post cervical spinal fusion 06/04/2020   SOB (shortness of breath)  06/04/2020   History of laryngeal spasm 06/04/2020   Atelectasis 06/04/2020   Resolved Ambulatory Problems    Diagnosis Date Noted   Hypothyroidism 07/14/2012   Past Medical History:  Diagnosis Date   Acute medial meniscal tear    Allergy     Anxiety    Aortic insufficiency    Aortic regurgitation    Arthritis    Carpal tunnel syndrome    Chronic kidney disease    Dysrhythmia    Mitral valve prolapse    Need for SBE (subacute bacterial endocarditis) prophylaxis    Pilonidal cyst without mention of abscess    Pneumonia Aug 14 2012   PONV (postoperative nausea and vomiting)    Sleep apnea    Thyroid disease    Transplanted kidney removed      Review of Systems See HPI.     Objective:   Physical Exam Vitals reviewed.  Constitutional:      Appearance: Normal appearance. She is obese.  HENT:     Head: Normocephalic.  Neck:     Comments: brusing anterior neck over incision with some swelling and tenderness.  Cardiovascular:     Rate and Rhythm: Normal rate and regular rhythm.     Pulses: Normal pulses.  Pulmonary:     Effort: Pulmonary effort is normal.     Breath sounds: Normal breath sounds. No wheezing or rhonchi.  Neurological:     General: No focal deficit present.     Mental Status: She is alert and oriented to person, place, and time.  Psychiatric:        Mood and Affect: Mood normal.           Assessment & Plan:  Marland KitchenMarland KitchenSantanna was seen today for cough.  Diagnoses and all orders for this visit:  Cough  Status post cervical spinal fusion  SOB (shortness of breath)  History of laryngeal spasm  Atelectasis   STAT CXR:   Mild atelectasis left base. Lungs elsewhere clear. Cardiac silhouette within normal limits. No adenopathy appreciable.  No sign of pneumonia or other infection. Wound looks like healing well. Continue using incentive spirometry. Reassuring vitals. CBC and CMP to follow up post surgery and ICU. Continue to follow up with neurosurgery after cervical fusion.  Use CPAP at night.  Follow up with fever, worsening SOB or cough.

## 2020-06-04 NOTE — Progress Notes (Signed)
l °

## 2020-06-04 NOTE — Telephone Encounter (Signed)
Alis,   WBC normal range and better than 4 days ago.  Hemoglobin normal.  Platelets a little elevated but do be expected after surgery and especially with complications.

## 2020-06-04 NOTE — Telephone Encounter (Signed)
Reviewed with patient at office visit.

## 2020-06-05 LAB — CBC WITH DIFFERENTIAL/PLATELET
Absolute Monocytes: 510 cells/uL (ref 200–950)
Basophils Absolute: 53 cells/uL (ref 0–200)
Basophils Relative: 0.6 %
Eosinophils Absolute: 616 cells/uL — ABNORMAL HIGH (ref 15–500)
Eosinophils Relative: 7 %
HCT: 45.5 % — ABNORMAL HIGH (ref 35.0–45.0)
Hemoglobin: 14.8 g/dL (ref 11.7–15.5)
Lymphs Abs: 1434 cells/uL (ref 850–3900)
MCH: 25.1 pg — ABNORMAL LOW (ref 27.0–33.0)
MCHC: 32.5 g/dL (ref 32.0–36.0)
MCV: 77.1 fL — ABNORMAL LOW (ref 80.0–100.0)
MPV: 10.1 fL (ref 7.5–12.5)
Monocytes Relative: 5.8 %
Neutro Abs: 6186 cells/uL (ref 1500–7800)
Neutrophils Relative %: 70.3 %
Platelets: 424 10*3/uL — ABNORMAL HIGH (ref 140–400)
RBC: 5.9 10*6/uL — ABNORMAL HIGH (ref 3.80–5.10)
RDW: 17.6 % — ABNORMAL HIGH (ref 11.0–15.0)
Total Lymphocyte: 16.3 %
WBC: 8.8 10*3/uL (ref 3.8–10.8)

## 2020-06-05 LAB — COMPLETE METABOLIC PANEL WITH GFR
AG Ratio: 1.5 (calc) (ref 1.0–2.5)
ALT: 25 U/L (ref 6–29)
AST: 21 U/L (ref 10–35)
Albumin: 3.8 g/dL (ref 3.6–5.1)
Alkaline phosphatase (APISO): 87 U/L (ref 37–153)
BUN/Creatinine Ratio: 16 (calc) (ref 6–22)
BUN: 17 mg/dL (ref 7–25)
CO2: 27 mmol/L (ref 20–32)
Calcium: 10 mg/dL (ref 8.6–10.4)
Chloride: 102 mmol/L (ref 98–110)
Creat: 1.09 mg/dL — ABNORMAL HIGH (ref 0.50–0.99)
GFR, Est African American: 62 mL/min/{1.73_m2} (ref >=60)
GFR, Est Non African American: 53 mL/min/{1.73_m2} — ABNORMAL LOW (ref >=60)
Globulin: 2.5 g/dL (calc) (ref 1.9–3.7)
Glucose, Bld: 143 mg/dL — ABNORMAL HIGH (ref 65–139)
Potassium: 4.2 mmol/L (ref 3.5–5.3)
Sodium: 139 mmol/L (ref 135–146)
Total Bilirubin: 0.8 mg/dL (ref 0.2–1.2)
Total Protein: 6.3 g/dL (ref 6.1–8.1)

## 2020-06-05 NOTE — Telephone Encounter (Signed)
[name],   GFR stable. Electrolytes great.

## 2020-06-07 NOTE — Telephone Encounter (Signed)
Please call patient back not sure what she is calling about.

## 2020-06-07 NOTE — Telephone Encounter (Signed)
Left vm message for pt to call us back.

## 2020-06-10 MED FILL — OZEMPIC 0.25 OR 0.5 MG/DOSE: 2 | 56 days supply | Qty: 2 | Fill #1

## 2020-06-11 NOTE — Telephone Encounter (Signed)
Called patient, she has been in the office, had Xray, talked through [place]. No other questions.

## 2020-06-19 DIAGNOSIS — M542 Cervicalgia: Secondary | ICD-10-CM | POA: Diagnosis not present

## 2020-06-19 DIAGNOSIS — M5000 Cervical disc disorder with myelopathy, unspecified cervical region: Secondary | ICD-10-CM | POA: Diagnosis not present

## 2020-06-19 DIAGNOSIS — M2578 Osteophyte, vertebrae: Secondary | ICD-10-CM | POA: Diagnosis not present

## 2020-06-19 DIAGNOSIS — Z6836 Body mass index (BMI) 36.0-36.9, adult: Secondary | ICD-10-CM | POA: Diagnosis not present

## 2020-06-19 DIAGNOSIS — M5412 Radiculopathy, cervical region: Secondary | ICD-10-CM | POA: Diagnosis not present

## 2020-06-19 DIAGNOSIS — G959 Disease of spinal cord, unspecified: Secondary | ICD-10-CM | POA: Diagnosis not present

## 2020-06-19 DIAGNOSIS — M4802 Spinal stenosis, cervical region: Secondary | ICD-10-CM | POA: Diagnosis not present

## 2020-06-19 DIAGNOSIS — M4712 Other spondylosis with myelopathy, cervical region: Secondary | ICD-10-CM | POA: Diagnosis not present

## 2020-06-24 ENCOUNTER — Encounter: Payer: Self-pay | Admitting: Internal Medicine

## 2020-06-24 ENCOUNTER — Other Ambulatory Visit: Payer: Self-pay

## 2020-06-24 ENCOUNTER — Ambulatory Visit (INDEPENDENT_AMBULATORY_CARE_PROVIDER_SITE_OTHER): Payer: 59 | Admitting: Internal Medicine

## 2020-06-24 ENCOUNTER — Ambulatory Visit: Payer: Self-pay

## 2020-06-24 VITALS — BP 111/66 | HR 76 | Ht 64.25 in | Wt 218.8 lb

## 2020-06-24 DIAGNOSIS — M5030 Other cervical disc degeneration, unspecified cervical region: Secondary | ICD-10-CM | POA: Diagnosis not present

## 2020-06-24 DIAGNOSIS — Z981 Arthrodesis status: Secondary | ICD-10-CM | POA: Diagnosis not present

## 2020-06-24 NOTE — Progress Notes (Signed)
Office Visit Note  Patient: [name]             Date of Birth: 1955-12-08           MRN: [id number]             PCP: Lavada Mesi Referring: Donella Stade, PA-C Visit Date: 06/24/2020 Occupation: Nurse  Subjective:   History of Present Illness: [name] is a 64 y.o. female here for evaluation of extensive multilevel stenosis and fusions in the thoracic and cervical spine. She has a history of hand and knee osteoarthritis and degenerative disc disease of the cervical and thoracic spine. However this year she developed cervical radiculopathy affecting the right arm. This was related to severe disease at the C5-C6 and C6-C7 level. She underwent anterior cervical spine fusion surgery for this last month with good resolution of her radicular symptoms. However she was recommended to have rheumatology evaluation for her extensive spinal fusion in the thoracic and cervical spine demonstrated on xray, CT, and MR imaging. She has low back pain intermittently usually use associated with her work but no persistent morning stiffness or night time pain worsening. She denies history of inflammatory eye disease or inflammatory bowel disease.   Imaging reviewed 05/2020 MR cervical spine 1. Multilevel degenerative changes of the cervical spine as described above. Mild to moderate spinal canal stenosis and severe bilateral neuroforaminal stenosis at C6-C7. 2. Mild spinal canal stenosis and moderate left neuroforaminal stenosis at C5-C6.  03/2020 DXA wrist wnl   Activities of Daily Living:  Patient reports morning stiffness for 0 minutes.   Patient Denies nocturnal pain.  Difficulty dressing/grooming: Denies Difficulty climbing stairs: Denies Difficulty getting out of chair: Denies Difficulty using hands for taps, buttons, cutlery, and/or writing: Denies  Review of Systems  Constitutional: Positive for fatigue.  HENT: Positive for mouth dryness. Negative for mouth sores  and nose dryness.   Eyes: Positive for itching. Negative for pain, visual disturbance and dryness.  Respiratory: Negative for cough, hemoptysis, shortness of breath and difficulty breathing.   Cardiovascular: Positive for swelling in legs/feet. Negative for chest pain and palpitations.  Gastrointestinal: Negative for abdominal pain, blood in stool, constipation and diarrhea.  Endocrine: Negative for increased urination.  Genitourinary: Negative for painful urination.  Musculoskeletal: Positive for arthralgias, joint pain, myalgias and myalgias. Negative for joint swelling, muscle weakness, morning stiffness and muscle tenderness.  Skin: Negative for color change, rash and redness.  Allergic/Immunologic: Negative for susceptible to infections.  Neurological: Negative for dizziness, numbness, headaches, memory loss and weakness.  Hematological: Negative for swollen glands.  Psychiatric/Behavioral: Negative for confusion and sleep disturbance.    PMFS History:  Patient Active Problem List   Diagnosis Date Noted  . Status post cervical spinal fusion 06/04/2020  . SOB (shortness of breath) 06/04/2020  . History of laryngeal spasm 06/04/2020  . Atelectasis 06/04/2020  . Airway compromise 05/28/2020  . Laryngospasm 05/28/2020  . Iron deficiency 04/17/2020  . Right arm pain 04/16/2020  . Right arm weakness 04/16/2020  . Degenerative disc disease, cervical 04/11/2020  . Seborrheic keratoses 01/05/2020  . Papules 01/05/2020  . Class 2 obesity due to excess calories without serious comorbidity with body mass index (BMI) of 38.0 to 38.9 in adult 01/05/2020  . Elevated lipase 06/12/2019  . Pain radiating to back 05/23/2019  . BPPV (benign paroxysmal positional vertigo), left 05/22/2019  . CKD (chronic kidney disease) stage 3, GFR 30-59 ml/min (HCC) 03/10/2019  . Fracture of  triquetral bone of wrist 02/21/2018  . BPPV (benign paroxysmal positional vertigo), right 02/20/2018  . Concussion wth  loss of consciousness of 30 minutes or less 02/20/2018  . Fall 02/20/2018  . Other meniscus derangements, posterior horn of medial meniscus, right knee 11/24/2017  . Pain of right thumb 11/24/2017  . Trigger thumb, right thumb 11/24/2017  . Pre-diabetes 10/19/2017  . Pain in thumb joint with movement of right hand 10/19/2017  . Class 2 obesity due to excess calories without serious comorbidity with body mass index (BMI) of 35.0 to 35.9 in adult 10/19/2017  . Unilateral primary osteoarthritis, right knee 09/27/2017  . Chronic pain of right knee 09/13/2017  . Hypertriglyceridemia 07/14/2017  . Elevated fasting glucose 07/14/2017  . Status post carpal tunnel release 03/11/2017  . Carpal tunnel syndrome, left upper limb 02/25/2017  . Daytime sleepiness 01/24/2017  . OSA (obstructive sleep apnea) 12/23/2015  . Hyperlipidemia 11/22/2014  . Single kidney 11/22/2014  . Hypothyroidism 11/22/2014  . Anxiety and depression 06/03/2014  . Primary osteoarthritis involving multiple joints 06/03/2014  . Nausea alone 11/18/2013  . Abnormal transaminases 11/18/2013  . Abdominal pain, chronic, right upper quadrant 11/16/2013  . Candidiasis of female genitalia 11/16/2013  . Elevated liver enzymes 11/16/2013  . Folliculitis 86/76/7209  . Arthritis of left knee 09/14/2013  . Status post total knee replacement 09/14/2013  . Cough 08/15/2013  . Obesity (BMI 30-39.9) 07/17/2013  . Essential hypertension, benign 04/29/2013  . Wheezing 04/29/2013  . Insomnia 04/29/2013  . Unspecified vitamin D deficiency 04/29/2013  . Migraine headache 04/29/2013  . Anxiety state 04/29/2013  . Unspecified hypothyroidism 04/29/2013  . UTI (urinary tract infection) 07/14/2012  . Other and unspecified hyperlipidemia 07/14/2012  . MVP (mitral valve prolapse) 07/14/2012  . GERD (gastroesophageal reflux disease) 07/14/2012  . Allergic rhinitis 07/14/2012  . Mitral regurgitation 07/14/2012  . S/P hysterectomy 07/14/2012     Past Medical History:  Diagnosis Date  . Acute medial meniscal tear    Right knee  . Allergy   . Anxiety   . Aortic insufficiency    Mild  . Aortic regurgitation   . Arthritis   . Carpal tunnel syndrome   . Chronic kidney disease    one kidney   . Dysrhythmia    pvc's at times  . GERD (gastroesophageal reflux disease)   . Hyperlipidemia   . Hypothyroidism   . Migraine headache   . Mitral regurgitation   . Mitral valve prolapse   . Nausea alone   . Need for SBE (subacute bacterial endocarditis) prophylaxis   . Pilonidal cyst without mention of abscess   . Pneumonia Aug 14 2012  . PONV (postoperative nausea and vomiting)    states she vasovagals  . Sleep apnea    wears CPAP  . Thyroid disease   . Transplanted kidney removed    donated kidney    Family History  Problem Relation Age of Onset  . Diabetes Mother   . Hypertension Mother   . Heart disease Father   . Hypertension Father   . Thyroid disease Father   . AAA (abdominal aortic aneurysm) Father   . Cancer Maternal Aunt   . Cancer Maternal Aunt   . Diabetes Sister   . Fibromyalgia Sister   . Obstructive Sleep Apnea Sister   . Kidney disease Daughter   . Heart disease Son   . Colon cancer Neg Hx   . Esophageal cancer Neg Hx   . Rectal cancer Neg Hx   .  Stomach cancer Neg Hx    Past Surgical History:  Procedure Laterality Date  . ABDOMINAL HYSTERECTOMY    . ANTERIOR CERVICAL DECOMP/DISCECTOMY FUSION  05/28/2020  . CARPAL TUNNEL RELEASE Right   . CARPAL TUNNEL RELEASE Left 02/25/2017   Procedure: LEFT OPEN CARPAL TUNNEL RELEASE;  Surgeon: [name], MD;  Location: [facility];  Service: Orthopedics;  Laterality: Left;  . CARPAL TUNNEL RELEASE Left 1998  . CARPAL TUNNEL RELEASE Right 2019  . COLONOSCOPY    . KNEE ARTHROSCOPY Right 01/06/2018   Procedure: RIGHT KNEE ARTHROSCOPY WITH PARTIAL  MEDIAL MENISCECTOMY;  Surgeon: [name], MD;  Location: [place] [facility] [place];  Service: Orthopedics;   Laterality: Right;  . KNEE SURGERY    . LARYNGOSCOPY N/A 05/30/2020   Procedure: LARYNGOSCOPY;  Surgeon: Leta Baptist, MD;  Location: [facility];  Service: ENT;  Laterality: N/A;  . NEPHRECTOMY    . PARTIAL KNEE ARTHROPLASTY Right 03/31/2018   Procedure: RIGHT KNEE UNI ARTHROPLASTY;  Surgeon: Marchia Bond, MD;  Location: [facility];  Service: Orthopedics;  Laterality: Right;  . TONSILLECTOMY    . TOTAL KNEE ARTHROPLASTY Left 09/14/2013   Procedure: LEFT TOTAL KNEE ARTHROPLASTY;  Surgeon: [name], MD;  Location: [place];  Service: Orthopedics;  Laterality: Left;  . TUBAL LIGATION    . WISDOM TOOTH EXTRACTION    . WOUND EXPLORATION N/A 05/28/2020   Procedure: ANTERIOR CERVICAL WOUND EXPLORATION;  Surgeon: Erline Levine, MD;  Location: [facility];  Service: Neurosurgery;  Laterality: N/A;   Social History   Social History Narrative   Marital Status:  Married Marine scientist)   Children: 4   Pets:  Dogs (2)   Living Situation: Lives with spouse, son and his family.     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 Influenza-Unspecified 04/10/2016, 05/16/2018  . PFIZER SARS-COV-2 Vaccination 08/02/2019, 08/23/2019  . Pneumococcal Conjugate-13 01/25/2014  . Pneumococcal Polysaccharide-23 04/11/2020  . Tdap 08/11/2003, 09/28/2011  . Zoster 05/24/2012  . Zoster Recombinat (Shingrix) 06/16/2017, 12/13/2017     Objective: Vital Signs: BP 111/66 (BP Location: Left Arm, Patient Position: Sitting, Cuff Size: Small)   Pulse 76   Ht 5' 4.25" (1.632 m)   Wt 218 lb 12.8 oz (99.2 kg)   BMI 37.27 kg/m    Physical Exam HENT:     Head: Normocephalic.     Right Ear: External ear normal.     Left Ear:  External ear normal.     Mouth/Throat:     Mouth: Mucous membranes are moist.     Pharynx: Oropharynx is clear.  Eyes:     Conjunctiva/sclera: Conjunctivae normal.  Cardiovascular:     Rate and Rhythm: Normal rate and regular rhythm.  Pulmonary:     Effort: Pulmonary effort is normal.     Breath sounds: Normal breath sounds.  Skin:    General: Skin is warm and dry.     Findings: No rash.  Neurological:     General: No focal deficit present.     Mental Status: She is alert.  Psychiatric:        Mood  and Affect: Mood normal.      Musculoskeletal Exam:  Neck restricted ROM in flexion and b/l lateral rotation Shoulder, elbow, wrist, fingers full range of motion no tenderness or swelling, heberdon nodes present over DIP joints b/l Normal hip internal and external rotation without pain Knees, ankles, MTPs full range of motion no tenderness or swelling, well healed anterior knee surgical scars Normal wall to tragus distance   CDAI Exam: CDAI Score: -- Patient Global: --; Provider Global: -- Swollen: --; Tender: -- Joint Exam 06/24/2020   No joint exam has been documented for this visit   There is currently no information documented on the homunculus. Go to the Rheumatology activity and complete the homunculus joint exam.  Investigation: No additional findings.  Imaging: DG Chest 2 View  Result Date: 06/04/2020 CLINICAL DATA:  Cough and shortness of breath. Recent cervical fusion EXAM: CHEST - 2 VIEW COMPARISON:  May 30, 2020 FINDINGS: There is mild atelectatic change in the left base. The lungs elsewhere are clear. Heart is upper normal in size with pulmonary vascularity normal. No adenopathy. There is degenerative change in the thoracic spine. There is postoperative change at the cervicothoracic junction level. IMPRESSION: Mild atelectasis left base. Lungs elsewhere clear. Cardiac silhouette within normal limits. No adenopathy appreciable. Electronically Signed   By:  Lowella Grip III M.D.   On: 06/04/2020 09:47   DG Abd 1 View  Result Date: 05/28/2020 CLINICAL DATA:  OG tube placement EXAM: ABDOMEN - 1 VIEW COMPARISON:  CT 05/23/2019 FINDINGS: Esophageal tube tip and side port overlie the proximal stomach. There is moderate gaseous dilatation of the stomach. There is consolidation at the left lung base. IMPRESSION: Esophageal tube tip overlies the proximal stomach. Moderate gaseous dilatation of the stomach. Electronically Signed   By: Donavan Foil M.D.   On: 05/28/2020 20:47   DG CHEST PORT 1 VIEW  Result Date: 05/30/2020 CLINICAL DATA:  Acute respiratory failure with hypoxemia. EXAM: PORTABLE CHEST 1 VIEW COMPARISON:  05/28/2020 FINDINGS: Endotracheal tube and enteric tube are unchanged in position. Shallow inspiration. Cardiac enlargement. Probable consolidation or atelectasis in the left lung base behind the heart. Infiltration in the right lung base is unchanged. Small left pleural effusion. IMPRESSION: Appliances are unchanged in position. Cardiac enlargement. Consolidation or atelectasis in the left lung base behind the heart with small left pleural effusion. Infiltration in the right lung base is unchanged. Electronically Signed   By: Lucienne Capers M.D.   On: 05/30/2020 04:41   DG Chest Port 1 View  Result Date: 05/28/2020 CLINICAL DATA:  Intubation EXAM: PORTABLE CHEST 1 VIEW COMPARISON:  09/07/2013 FINDINGS: Endotracheal tube is 4 cm above the carina. Mild cardiomegaly. Bilateral lower lobe airspace opacities. Small left pleural effusion. No pneumothorax or acute bony abnormality. NG tube is in the stomach. IMPRESSION: Bilateral lower lobe airspace opacities, left greater than right concerning for pneumonia. Small left pleural effusion. Mild cardiomegaly. Electronically Signed   By: Rolm Baptise M.D.   On: 05/28/2020 20:47   XR Lumbar Spine 2-3 Views  Result Date: 06/24/2020 Xray lumbar spine 2 views Leftward deviation of L3-L5 with  compensatory dextroscoliosis above this level. Anterior osteophytes are present at multiple levels most extensive at L2-L3 but also L1-L2 L3-L4 and L4-L5 levels. Disc spaces are relatively preserved. Posterior osteophytes at face joints at L2, L3, L4 vertebrae. No bridging osteophytes present. SI joints appear open. Impression Multilevel degenerative disease with osteophyte formation, no erosions or ankylosis present   Recent Labs: Lab Results  Component Value Date   WBC 8.8 06/04/2020   HGB 14.8 06/04/2020   PLT 424 (H) 06/04/2020   NA 139 06/04/2020   K 4.2 06/04/2020   CL 102 06/04/2020   CO2 27 06/04/2020   GLUCOSE 143 (H) 06/04/2020   BUN 17 06/04/2020   CREATININE 1.09 (H) 06/04/2020   BILITOT 0.8 06/04/2020   ALKPHOS 50 05/31/2020   AST 21 06/04/2020   ALT 25 06/04/2020   PROT 6.3 06/04/2020   ALBUMIN 3.1 (L) 05/31/2020   CALCIUM 10.0 06/04/2020   GFRAA 62 06/04/2020    Speciality Comments: No specialty comments available.  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 She has no ankylosis of the lumbar spine or SI joint fusion and no history of inflammatory back pain, ocular, or intestinal disease to suggest spondyloarthritis. She has diabetes and thyroid disease which are associated risk factors for hyperostosis. Discussed prognosis usually benign but does remain at risk of further osteophyte enlargement  that would warrant surgical follow up as needed. No specific treatment is indicated for DISH, but encouraged her to maintain stretching and ROM but no rheumatology follow up needed at this time.  Orders: Orders Placed This Encounter  Procedures  . XR Lumbar Spine 2-3 Views   No orders of the defined types were placed in this encounter.   Follow-Up Instructions: Return if symptoms worsen or fail to improve.   Collier Salina, MD  Note - This record has been created using [employer].  Chart creation errors have been sought, but may not always  have been located. Such creation errors do not reflect on  the standard of medical care.

## 2020-06-24 NOTE — Patient Instructions (Addendum)
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 This is not an inflammatory disease but is a disorder of the joints and the tendons attached to them that causes excess bony deposition and bridging that fuses joints together.

## 2020-07-05 ENCOUNTER — Other Ambulatory Visit: Payer: Self-pay | Admitting: Physician Assistant

## 2020-07-05 DIAGNOSIS — I10 Essential (primary) hypertension: Secondary | ICD-10-CM

## 2020-07-08 ENCOUNTER — Other Ambulatory Visit: Payer: Self-pay | Admitting: Physician Assistant

## 2020-07-08 MED FILL — HYDROCHLOROTHIAZIDE 12.5 MG: 12.5 | 90 days supply | Qty: 90 | Fill #0

## 2020-07-10 MED FILL — LEVOTHYROXINE SODIUM 125 MC: 125 | 90 days supply | Qty: 90 | Fill #1

## 2020-07-22 ENCOUNTER — Other Ambulatory Visit: Payer: Self-pay | Admitting: Physician Assistant

## 2020-07-22 DIAGNOSIS — F411 Generalized anxiety disorder: Secondary | ICD-10-CM

## 2020-07-22 DIAGNOSIS — M542 Cervicalgia: Secondary | ICD-10-CM | POA: Diagnosis not present

## 2020-07-22 DIAGNOSIS — M5000 Cervical disc disorder with myelopathy, unspecified cervical region: Secondary | ICD-10-CM | POA: Diagnosis not present

## 2020-07-22 DIAGNOSIS — G959 Disease of spinal cord, unspecified: Secondary | ICD-10-CM | POA: Diagnosis not present

## 2020-07-22 DIAGNOSIS — M2578 Osteophyte, vertebrae: Secondary | ICD-10-CM | POA: Diagnosis not present

## 2020-07-22 DIAGNOSIS — M4712 Other spondylosis with myelopathy, cervical region: Secondary | ICD-10-CM | POA: Diagnosis not present

## 2020-07-22 DIAGNOSIS — I10 Essential (primary) hypertension: Secondary | ICD-10-CM

## 2020-07-22 MED FILL — CITALOPRAM HBR 20 MG TABLET: 20 | 90 days supply | Qty: 45 | Fill #0

## 2020-07-22 MED FILL — LISINOPRIL 2.5 MG TABLET: 2.5 | 90 days supply | Qty: 90 | Fill #0

## 2020-07-22 MED FILL — ESOMEPRAZOLE MAG DR 40 MG C: 40 | 90 days supply | Qty: 90 | Fill #2

## 2020-08-20 MED FILL — ROSUVASTATIN CALCIUM 10 MG: 10 | 90 days supply | Qty: 90 | Fill #1

## 2020-09-19 ENCOUNTER — Encounter: Payer: Self-pay | Admitting: Physician Assistant

## 2020-09-23 ENCOUNTER — Ambulatory Visit (INDEPENDENT_AMBULATORY_CARE_PROVIDER_SITE_OTHER): Payer: Medicare Other | Admitting: Physician Assistant

## 2020-09-23 DIAGNOSIS — Z5329 Procedure and treatment not carried out because of patient's decision for other reasons: Secondary | ICD-10-CM

## 2020-09-23 NOTE — Progress Notes (Signed)
No show

## 2020-10-01 MED FILL — HYDROCHLOROTHIAZIDE 12.5 MG: 12.5 | 90 days supply | Qty: 90 | Fill #1

## 2020-10-03 ENCOUNTER — Telehealth: Payer: Self-pay | Admitting: Neurology

## 2020-10-03 NOTE — Telephone Encounter (Signed)
Prior Authorization for Ozempic submitted via covermymeds. Awaiting response. [employer] is reviewing your PA request and will respond within 24 hours for Medicaid or up to 72 hours for non-Medicaid plans, based on the required timeframe determined by state or federal regulations. To check for an update later, open this request from your dashboard.

## 2020-10-07 ENCOUNTER — Other Ambulatory Visit: Payer: Self-pay | Admitting: Physician Assistant

## 2020-10-07 DIAGNOSIS — E039 Hypothyroidism, unspecified: Secondary | ICD-10-CM

## 2020-10-07 MED FILL — LEVOTHYROXINE SODIUM 125 MC: 125 | 90 days supply | Qty: 90 | Fill #0

## 2020-10-14 ENCOUNTER — Encounter: Payer: Self-pay | Admitting: Physician Assistant

## 2020-10-14 ENCOUNTER — Other Ambulatory Visit: Payer: Self-pay

## 2020-10-14 ENCOUNTER — Ambulatory Visit (INDEPENDENT_AMBULATORY_CARE_PROVIDER_SITE_OTHER): Payer: Medicare Other | Admitting: Physician Assistant

## 2020-10-14 ENCOUNTER — Other Ambulatory Visit: Payer: Self-pay | Admitting: Physician Assistant

## 2020-10-14 VITALS — BP 119/56 | HR 70 | Ht 64.25 in | Wt 220.0 lb

## 2020-10-14 DIAGNOSIS — Z6837 Body mass index (BMI) 37.0-37.9, adult: Secondary | ICD-10-CM

## 2020-10-14 DIAGNOSIS — R519 Headache, unspecified: Secondary | ICD-10-CM | POA: Diagnosis not present

## 2020-10-14 DIAGNOSIS — I10 Essential (primary) hypertension: Secondary | ICD-10-CM

## 2020-10-14 DIAGNOSIS — E782 Mixed hyperlipidemia: Secondary | ICD-10-CM

## 2020-10-14 DIAGNOSIS — J302 Other seasonal allergic rhinitis: Secondary | ICD-10-CM

## 2020-10-14 DIAGNOSIS — K2100 Gastro-esophageal reflux disease with esophagitis, without bleeding: Secondary | ICD-10-CM

## 2020-10-14 DIAGNOSIS — R7303 Prediabetes: Secondary | ICD-10-CM

## 2020-10-14 DIAGNOSIS — E039 Hypothyroidism, unspecified: Secondary | ICD-10-CM | POA: Diagnosis not present

## 2020-10-14 DIAGNOSIS — E6609 Other obesity due to excess calories: Secondary | ICD-10-CM

## 2020-10-14 DIAGNOSIS — F411 Generalized anxiety disorder: Secondary | ICD-10-CM | POA: Diagnosis not present

## 2020-10-14 DIAGNOSIS — M5030 Other cervical disc degeneration, unspecified cervical region: Secondary | ICD-10-CM

## 2020-10-14 MED ORDER — LISINOPRIL 2.5 MG PO TABS: 2.5000 mg | ORAL_TABLET | Freq: Every day | ORAL | 1 refills | Status: DC

## 2020-10-14 MED ORDER — PANTOPRAZOLE SODIUM 40 MG PO TBEC: 40.0000 mg | DELAYED_RELEASE_TABLET | Freq: Every day | ORAL | 3 refills | Status: DC

## 2020-10-14 MED ORDER — DICLOFENAC SODIUM 1 % EX GEL: 4.0000 g | Freq: Four times a day (QID) | CUTANEOUS | 11 refills | Status: DC

## 2020-10-14 MED ORDER — CITALOPRAM HYDROBROMIDE 20 MG PO TABS: ORAL_TABLET | ORAL | 1 refills | Status: DC

## 2020-10-14 MED ORDER — CYCLOBENZAPRINE HCL 5 MG PO TABS: 5.0000 mg | ORAL_TABLET | Freq: Three times a day (TID) | ORAL | 1 refills | Status: DC

## 2020-10-14 MED FILL — PANTOPRAZOLE SOD DR 40 MG T: 40 | 90 days supply | Qty: 90 | Fill #0

## 2020-10-14 MED FILL — DICLOFENAC SODIUM 1 % GEL: 1 | 6 days supply | Qty: 100 | Fill #0

## 2020-10-14 MED FILL — CYCLOBENZAPRINE HCL 5 MG TA: 5 | 30 days supply | Qty: 90 | Fill #0

## 2020-10-14 MED FILL — CITALOPRAM HBR 20 MG TABLET: 20 | 90 days supply | Qty: 45 | Fill #0

## 2020-10-14 MED FILL — LISINOPRIL 2.5 MG TABLET: 2.5 | 90 days supply | Qty: 90 | Fill #0

## 2020-10-14 NOTE — Patient Instructions (Signed)
Posture corrector Massage every 2 weeks then transition to once a month Good supportive pillow voltaren on neck.  Alternating heat and ice Flexeril at bedtime 1-2 tablets.  NSaIDs sparingly.    Neck Exercises Ask your health care provider which exercises are safe for you. Do exercises exactly as told by your health care provider and adjust them as directed. It is normal to feel mild stretching, pulling, tightness, or discomfort as you do these exercises. Stop right away if you feel sudden pain or your pain gets worse. Do not begin these exercises until told by your health care provider. Neck exercises can be important for many reasons. They can improve strength and maintain flexibility in your neck, which will help your upper back and prevent neck pain. Stretching exercises Rotation neck stretching 1. Sit in a chair or stand up. 2. Place your feet flat on the floor, shoulder width apart. 3. Slowly turn your head (rotate) to the right until a slight stretch is felt. Turn it all the way to the right so you can look over your right shoulder. Do not tilt or tip your head. 4. Hold this position for 10-30 seconds. 5. Slowly turn your head (rotate) to the left until a slight stretch is felt. Turn it all the way to the left so you can look over your left shoulder. Do not tilt or tip your head. 6. Hold this position for 10-30 seconds. Repeat __________ times. Complete this exercise __________ times a day.   Neck retraction 1. Sit in a sturdy chair or stand up. 2. Look straight ahead. Do not bend your neck. 3. Use your fingers to push your chin backward (retraction). Do not bend your neck for this movement. Continue to face straight ahead. If you are doing the exercise properly, you will feel a slight sensation in your throat and a stretch at the back of your neck. 4. Hold the stretch for 1-2 seconds. Repeat __________ times. Complete this exercise __________ times a day. Strengthening  exercises Neck press 1. Lie on your back on a firm bed or on the floor with a pillow under your head. 2. Use your neck muscles to push your head down on the pillow and straighten your spine. 3. Hold the position as well as you can. Keep your head facing up (in a neutral position) and your chin tucked. 4. Slowly count to 5 while holding this position. Repeat __________ times. Complete this exercise __________ times a day. Isometrics These are exercises in which you strengthen the muscles in your neck while keeping your neck still (isometrics). 1. Sit in a supportive chair and place your hand on your forehead. 2. Keep your head and face facing straight ahead. Do not flex or extend your neck while doing isometrics. 3. Push forward with your head and neck while pushing back with your hand. Hold for 10 seconds. 4. Do the sequence again, this time putting your hand against the back of your head. Use your head and neck to push backward against the hand pressure. 5. Finally, do the same exercise on either side of your head, pushing sideways against the pressure of your hand. Repeat __________ times. Complete this exercise __________ times a day. Prone head lifts 1. Lie face-down (prone position), resting on your elbows so that your chest and upper back are raised. 2. Start with your head facing downward, near your chest. Position your chin either on or near your chest. 3. Slowly lift your head upward. Lift until you  are looking straight ahead. Then continue lifting your head as far back as you can comfortably stretch. 4. Hold your head up for 5 seconds. Then slowly lower it to your starting position. Repeat __________ times. Complete this exercise __________ times a day. Supine head lifts 1. Lie on your back (supine position), bending your knees to point to the ceiling and keeping your feet flat on the floor. 2. Lift your head slowly off the floor, raising your chin toward your chest. 3. Hold for 5  seconds. Repeat __________ times. Complete this exercise __________ times a day. Scapular retraction 1. Stand with your arms at your sides. Look straight ahead. 2. Slowly pull both shoulders (scapulae) backward and downward (retraction) until you feel a stretch between your shoulder blades in your upper back. 3. Hold for 10-30 seconds. 4. Relax and repeat. Repeat __________ times. Complete this exercise __________ times a day. Contact a health care provider if:  Your neck pain or discomfort gets much worse when you do an exercise.  Your neck pain or discomfort does not improve within 2 hours after you exercise. If you have any of these problems, stop exercising right away. Do not do the exercises again unless your health care provider says that you can. Get help right away if:  You develop sudden, severe neck pain. If this happens, stop exercising right away. Do not do the exercises again unless your health care provider says that you can. This information is not intended to replace advice given to you by your health care provider. Make sure you discuss any questions you have with your health care provider. Document Revised: 05/25/2018 Document Reviewed: 05/25/2018 Elsevier Patient Education  2021 [place].  Tension Headache, Adult A tension headache is a feeling of pain, pressure, or aching in the head. It is often felt over the front and sides of the head. Tension headaches can last from 30 minutes to several days. What are the causes? The cause of this condition is not known. Sometimes, tension headaches are brought on by stress, worry (anxiety), or depression. Other things that may set them off include:  Alcohol.  Too much caffeine or caffeine withdrawal.  Colds, flu, or sinus infections.  Dental problems. This can include clenching your teeth.  Being tired.  Holding your head and neck in the same position for a long time, such as while using a  computer.  Smoking.  Arthritis in the neck. What are the signs or symptoms?  Feeling pressure around the head.  A dull ache in the head.  Pain over the front and sides of the head.  Feeling sore or tender in the muscles of the head, neck, and shoulders. How is this treated? This condition may be treated with lifestyle changes and with medicines that help relieve symptoms. Follow these instructions at home: Managing pain  Take over-the-counter and prescription medicines only as told by your doctor.  When you have a headache, lie down in a dark, quiet room.  If told, put ice on your head and neck. To do this: ? Put ice in a plastic bag. ? Place a towel between your skin and the bag. ? Leave the ice on for 20 minutes, 2-3 times a day. ? Take off the ice if your skin turns bright red. This is very important. If you cannot feel pain, heat, or cold, you have a greater risk of damage to the area.  If told, put heat on the back of your neck. Do this  as often as told by your doctor. Use the heat source that your doctor recommends, such as a moist heat pack or a heating pad. ? Place a towel between your skin and the heat source. ? Leave the heat on for 20-30 minutes. ? Take off the heat if your skin turns bright red. This is very important. If you cannot feel pain, heat, or cold, you have a greater risk of getting burned. Eating and drinking  Eat meals on a regular schedule.  If you drink alcohol: ? Limit how much you have to:  0-1 drink a day for women who are not pregnant.  0-2 drinks a day for men. ? Know how much alcohol is in your drink. In the [place] one drink equals one 12 oz bottle of beer (355 mL), one 5 oz glass of wine (148 mL), or one 1 oz glass of hard liquor (44 mL).  Drink enough fluid to keep your pee (urine) pale yellow.  Do not use a lot of caffeine, or stop using caffeine. Lifestyle  Get 7-9 hours of sleep each night. Or get the amount of sleep that your  doctor tells you to.  At bedtime, keep computers, phones, and tablets out of your room.  Find ways to lessen your stress. This may include: ? Exercise. ? Deep breathing. ? Yoga. ? Listening to music. ? Thinking positive thoughts.  Sit up straight. Try to relax your muscles.  Do not smoke or use any products that contain nicotine or tobacco. If you need help quitting, ask your doctor. General instructions  Avoid things that can bring on headaches. Keep a headache journal to see what may bring on headaches. For example, write down: ? What you eat and drink. ? How much sleep you get. ? Any change to your diet or medicines.  Keep all follow-up visits.   Contact a doctor if:  Your headache does not get better.  Your headache comes back.  You have a headache, and sounds, light, or smells bother you.  You feel like you may vomit, or you vomit.  Your stomach hurts. Get help right away if:  You all of a sudden get a very bad headache with any of these things: ? A stiff neck. ? Feeling like you may vomit. ? Vomiting. ? Feeling mixed up (confused). ? Feeling weak in one part or one side of your body. ? Having trouble seeing or speaking, or both. ? Feeling short of breath. ? A rash. ? Feeling very sleepy. ? Pain in your eye or ear. ? Trouble walking or balancing. ? Feeling like you will faint, or you faint. Summary  A tension headache is pain, pressure, or aching in your head.  Tension headaches can last from 30 minutes to several days.  Lifestyle changes and medicines may help relieve pain. This information is not intended to replace advice given to you by your health care provider. Make sure you discuss any questions you have with your health care provider. Document Revised: 04/25/2020 Document Reviewed: 04/25/2020 Elsevier Patient Education  2021 [employer].

## 2020-10-14 NOTE — Progress Notes (Signed)
Subjective:    Patient ID: [name], female    DOB: [birth year], 65 y.o.   MRN: [id number]  HPI  Pt is a 65 yo obese female with pre-diabetes, HTN, OSA, hypothyroidism, cervical DDD, CKD, depressed mood who presents to the clinic for follow up and to discuss new frequent headaches.   For the past 2-3 weeks she has had a constant headache. Seems to start in the neck or back of the head and radiate forward. NSAIDs do help but has CKD and cannot take. New supportive pillow has helped. She takes flexeril as needed and does help but also makes sleepy. Rates 5/10 most days. Waxes and wanes. Bilateral squeezing like pain. No vision changes, nausea, vomiting.    Pre-diabetes- not had for 3 weeks since insurance not paying. Not checking sugars.      .. Lab Results  Component Value Date   HGBA1C 6.1 (H) 04/11/2020      Review of Systems  All other systems reviewed and are negative.      Objective:   Physical Exam Vitals reviewed.  Constitutional:      Appearance: Normal appearance. She is obese.  HENT:     Head: Normocephalic.  Neck:     Vascular: No carotid bruit.     Comments: Decreased ROM due to stiffness and pain of neck.  Tightness of paraspinal muscles of neck.  No tenderness over cspine to palpation.  No radicular pain.  Cardiovascular:     Rate and Rhythm: Normal rate and regular rhythm.     Pulses: Normal pulses.  Pulmonary:     Effort: Pulmonary effort is normal.     Breath sounds: Normal breath sounds.  Musculoskeletal:        General: Normal range of motion.  Lymphadenopathy:     Cervical: No cervical adenopathy.  Neurological:     General: No focal deficit present.     Mental Status: She is alert and oriented to person, place, and time.  Psychiatric:        Mood and Affect: Mood normal.     .. Depression screen [facility] 2/9 10/14/2020 01/05/2020 03/09/2019 11/02/2018 03/08/2018  Decreased Interest 0 0 0 0 0  Down, Depressed, Hopeless 0 0 0 0 0  PHQ - 2  Score 0 0 0 0 0  Altered sleeping 0 0 0 0 0  Tired, decreased energy 0 0 0 0 0  Change in appetite 0 0 0 0 0  Feeling bad or failure about yourself  0 0 0 0 0  Trouble concentrating 0 0 0 0 0  Moving slowly or fidgety/restless 0 0 0 0 0  Suicidal thoughts 0 0 0 0 0  PHQ-9 Score 0 0 0 0 0  Difficult doing work/chores Not difficult at all Not difficult at all Not difficult at all Not difficult at all Not difficult at all   .Marland Kitchen GAD 7 : Generalized Anxiety Score 10/14/2020 01/05/2020 03/09/2019 11/02/2018  Nervous, Anxious, on Edge 0 0 0 0  Control/stop worrying 0 0 0 0  Worry too much - different things 0 0 0 0  Trouble relaxing 0 0 0 0  Restless 0 0 0 0  Easily annoyed or irritable 0 0 0 0  Afraid - awful might happen 0 0 0 0  Total GAD 7 Score 0 0 0 0  Anxiety Difficulty Not difficult at all Not difficult at all Not difficult at all Not difficult at all  Assessment & Plan:  Marland KitchenMarland KitchenIda was seen today for follow-up.  Diagnoses and all orders for this visit:  Frequent headaches -     cyclobenzaprine (FLEXERIL) 5 MG tablet; Take 1 tablet (5 mg total) by mouth 3 (three) times daily.  Anxiety state -     COMPLETE METABOLIC PANEL WITH GFR -     citalopram (CELEXA) 20 MG tablet; TAKE 1/2 TABLET (10 MG TOTAL) BY MOUTH DAILY.  Essential hypertension, benign -     COMPLETE METABOLIC PANEL WITH GFR -     lisinopril (ZESTRIL) 2.5 MG tablet; Take 1 tablet (2.5 mg total) by mouth daily.  Hypothyroidism, unspecified type -     TSH -     COMPLETE METABOLIC PANEL WITH GFR  Mixed hyperlipidemia -     Lipid Panel w/reflex Direct LDL -     COMPLETE METABOLIC PANEL WITH GFR  Prediabetes -     Hemoglobin A1c  Class 2 obesity due to excess calories without serious comorbidity with body mass index (BMI) of 37.0 to 37.9 in adult  Gastroesophageal reflux disease with esophagitis, unspecified whether hemorrhage -     pantoprazole (PROTONIX) 40 MG tablet; Take 1 tablet (40 mg total) by  mouth daily.  Seasonal allergies  DDD (degenerative disc disease), cervical -     diclofenac Sodium (VOLTAREN) 1 % GEL; Apply 4 g topically 4 (four) times daily. To affected joint. -     cyclobenzaprine (FLEXERIL) 5 MG tablet; Take 1 tablet (5 mg total) by mouth 3 (three) times daily.     nexium switched to protonix.  Zyrtec switched to allegra.   Due for labs.   Mood controlled-refilled celexa.  HTN/CKD- on lisionpril.   Suspect HA's are more tension headaches.  Posture corrector Massage every 2 weeks then transition to once a month Good supportive pillow voltaren on neck.  Alternating heat and ice Flexeril at bedtime 1-2 tablets.  NSaIDs sparingly.

## 2020-10-17 NOTE — Telephone Encounter (Signed)
ZOXWRU:[id number];XBJYNW:GNFAOZ;Review Type:Prior Auth;Appeal Information: Attention:ATTN: [place] [id number]. HYQMV,HQ,[id number] UXLKG:[phone] UYQ:[phone]; Important - Please read the below note on eAppeals: Please reference the denial letter for information on the rights for an appeal, rationale for the denial, and how to submit an appeal including if any information is needed to support the appeal. Note about urgent situations - Generally, an urgent situation is one which, in the opinion of the provider, the health of the patient may be in serious jeopardy or may experience pain that cannot be adequately controlled while waiting for a decision on the appeal.;

## 2020-10-19 LAB — COMPLETE METABOLIC PANEL WITH GFR
AG Ratio: 1.9 (calc) (ref 1.0–2.5)
ALT: 49 U/L — ABNORMAL HIGH (ref 6–29)
AST: 35 U/L (ref 10–35)
Albumin: 4.6 g/dL (ref 3.6–5.1)
Alkaline phosphatase (APISO): 76 U/L (ref 37–153)
BUN/Creatinine Ratio: 21 (calc) (ref 6–22)
BUN: 26 mg/dL — ABNORMAL HIGH (ref 7–25)
CO2: 30 mmol/L (ref 20–32)
Calcium: 10.2 mg/dL (ref 8.6–10.4)
Chloride: 102 mmol/L (ref 98–110)
Creat: 1.24 mg/dL — ABNORMAL HIGH (ref 0.50–0.99)
GFR, Est African American: 53 mL/min/{1.73_m2} — ABNORMAL LOW (ref >=60)
GFR, Est Non African American: 46 mL/min/{1.73_m2} — ABNORMAL LOW (ref >=60)
Globulin: 2.4 g/dL (calc) (ref 1.9–3.7)
Glucose, Bld: 110 mg/dL — ABNORMAL HIGH (ref 65–99)
Potassium: 4.7 mmol/L (ref 3.5–5.3)
Sodium: 139 mmol/L (ref 135–146)
Total Bilirubin: 0.6 mg/dL (ref 0.2–1.2)
Total Protein: 7 g/dL (ref 6.1–8.1)

## 2020-10-19 LAB — LIPID PANEL W/REFLEX DIRECT LDL
Cholesterol: 218 mg/dL — ABNORMAL HIGH (ref <200)
HDL: 58 mg/dL (ref >=50)
LDL Cholesterol (Calc): 128 mg/dL (calc) — ABNORMAL HIGH
Non-HDL Cholesterol (Calc): 160 mg/dL (calc) — ABNORMAL HIGH (ref <130)
Total CHOL/HDL Ratio: 3.8 (calc) (ref <5.0)
Triglycerides: 180 mg/dL — ABNORMAL HIGH (ref <150)

## 2020-10-19 LAB — HEMOGLOBIN A1C
Hgb A1c MFr Bld: 5.8 % of total Hgb — ABNORMAL HIGH (ref <5.7)
Mean Plasma Glucose: 120 mg/dL
eAG (mmol/L): 6.6 mmol/L

## 2020-10-19 LAB — TSH: TSH: 1.45 mIU/L (ref 0.40–4.50)

## 2020-10-23 ENCOUNTER — Other Ambulatory Visit: Payer: Self-pay | Admitting: Neurology

## 2020-10-23 DIAGNOSIS — N289 Disorder of kidney and ureter, unspecified: Secondary | ICD-10-CM

## 2020-10-23 DIAGNOSIS — R7989 Other specified abnormal findings of blood chemistry: Secondary | ICD-10-CM

## 2020-10-23 DIAGNOSIS — R748 Abnormal levels of other serum enzymes: Secondary | ICD-10-CM

## 2020-10-23 NOTE — Progress Notes (Signed)
Niajah,   A1C has improved to 5.8! Thyroid perfect.   Your kidney function has declined. GFR is 46 and BUN is elevated.  How much water are you drinking a day? Make sure drinking at least 50oz of water a day. Are you taking any anti-inflammatories?  I am wondering about your anti-histamines as well. Some times they can effect kidney function.  One liver enzyme up too.   Recheck in 2 weeks and see if trending up or down.

## 2020-10-25 NOTE — Progress Notes (Signed)
Ok to add CBC

## 2020-10-25 NOTE — Addendum Note (Signed)
Addended byAnnamaria Helling on: 10/25/2020 01:54 PM   Modules accepted: Orders

## 2020-11-16 ENCOUNTER — Other Ambulatory Visit: Payer: Self-pay | Admitting: Physician Assistant

## 2020-11-16 DIAGNOSIS — E781 Pure hyperglyceridemia: Secondary | ICD-10-CM

## 2020-11-16 DIAGNOSIS — E782 Mixed hyperlipidemia: Secondary | ICD-10-CM

## 2020-11-18 ENCOUNTER — Other Ambulatory Visit (HOSPITAL_BASED_OUTPATIENT_CLINIC_OR_DEPARTMENT_OTHER): Payer: Self-pay

## 2020-11-18 MED ORDER — ROSUVASTATIN CALCIUM 10 MG PO TABS
10.0000 mg | ORAL_TABLET | Freq: Every day | ORAL | 3 refills | Status: DC
  Filled 2020-11-18: qty 90, 90d supply, fill #0
  Filled 2021-02-19: qty 90, 90d supply, fill #1
  Filled 2021-05-11: qty 90, 90d supply, fill #2

## 2020-12-25 ENCOUNTER — Other Ambulatory Visit (HOSPITAL_BASED_OUTPATIENT_CLINIC_OR_DEPARTMENT_OTHER): Payer: Self-pay

## 2020-12-25 ENCOUNTER — Other Ambulatory Visit: Payer: Self-pay | Admitting: Physician Assistant

## 2020-12-25 DIAGNOSIS — I10 Essential (primary) hypertension: Secondary | ICD-10-CM

## 2020-12-25 MED ORDER — HYDROCHLOROTHIAZIDE 12.5 MG PO CAPS
12.5000 mg | ORAL_CAPSULE | Freq: Every day | ORAL | 0 refills | Status: DC
  Filled 2020-12-25: qty 90, 90d supply, fill #0

## 2021-01-07 ENCOUNTER — Other Ambulatory Visit (HOSPITAL_BASED_OUTPATIENT_CLINIC_OR_DEPARTMENT_OTHER): Payer: Self-pay

## 2021-01-07 MED FILL — Levothyroxine Sodium Tab 125 MCG: ORAL | 90 days supply | Qty: 90 | Fill #0 | Status: AC

## 2021-01-14 MED FILL — Pantoprazole Sodium EC Tab 40 MG (Base Equiv): ORAL | 90 days supply | Qty: 90 | Fill #0 | Status: AC

## 2021-01-15 ENCOUNTER — Other Ambulatory Visit (HOSPITAL_BASED_OUTPATIENT_CLINIC_OR_DEPARTMENT_OTHER): Payer: Self-pay

## 2021-01-19 MED FILL — Citalopram Hydrobromide Tab 20 MG (Base Equiv): ORAL | 90 days supply | Qty: 45 | Fill #0 | Status: AC

## 2021-01-20 ENCOUNTER — Other Ambulatory Visit (HOSPITAL_BASED_OUTPATIENT_CLINIC_OR_DEPARTMENT_OTHER): Payer: Self-pay

## 2021-01-22 ENCOUNTER — Other Ambulatory Visit (HOSPITAL_BASED_OUTPATIENT_CLINIC_OR_DEPARTMENT_OTHER): Payer: Self-pay

## 2021-01-22 MED FILL — Lisinopril Tab 2.5 MG: ORAL | 90 days supply | Qty: 90 | Fill #0 | Status: AC

## 2021-02-19 ENCOUNTER — Other Ambulatory Visit (HOSPITAL_BASED_OUTPATIENT_CLINIC_OR_DEPARTMENT_OTHER): Payer: Self-pay

## 2021-03-04 ENCOUNTER — Ambulatory Visit (INDEPENDENT_AMBULATORY_CARE_PROVIDER_SITE_OTHER): Payer: Medicare Other | Admitting: Physician Assistant

## 2021-03-04 VITALS — BP 129/58 | HR 71 | Ht 64.5 in | Wt 226.0 lb

## 2021-03-04 DIAGNOSIS — Z1231 Encounter for screening mammogram for malignant neoplasm of breast: Secondary | ICD-10-CM | POA: Diagnosis not present

## 2021-03-04 DIAGNOSIS — Z0000 Encounter for general adult medical examination without abnormal findings: Secondary | ICD-10-CM

## 2021-03-04 NOTE — Patient Instructions (Addendum)
[place] Maintenance Summary and Written Plan of Care  Ms. Straker ,  Thank you for allowing me to perform your Medicare Annual Wellness Visit and for your ongoing commitment to your health.   Health Maintenance & Immunization History Health Maintenance  Topic Date Due   COVID-19 Vaccine (4 - Booster for Pfizer series) 03/20/2021 (Originally 07/29/2020)   INFLUENZA VACCINE  03/10/2021   TETANUS/TDAP  09/27/2021   MAMMOGRAM  02/05/2022   COLONOSCOPY (Pts 45-70yr Insurance coverage will need to be confirmed)  05/13/2023   DEXA SCAN  Completed   Hepatitis C Screening  Completed   PNA vac Low Risk Adult  Completed   Zoster Vaccines- Shingrix  Completed   HPV VACCINES  Aged Out   Immunization History  Administered Date(s) Administered   Influenza Whole 08/14/1999   Influenza-Unspecified 04/10/2016, 05/16/2018   PFIZER(Purple Top)SARS-COV-2 Vaccination 08/02/2019, 08/23/2019, 04/29/2020   Pneumococcal Conjugate-13 01/25/2014   Pneumococcal Polysaccharide-23 04/11/2020   Tdap 08/11/2003, 09/28/2011   Zoster Recombinat (Shingrix) 06/16/2017, 12/13/2017   Zoster, Live 05/24/2012    These are the patient goals that we discussed:  Goals Addressed               This Visit's Progress     Patient Stated (pt-stated)        03/04/2021 AWV Goal: Exercise for General Health  Patient will verbalize understanding of the benefits of increased physical activity: Exercising regularly is important. It will improve your overall fitness, flexibility, and endurance. Regular exercise also will improve your overall health. It can help you control your weight, reduce stress, and improve your bone density. Over the next year, patient will increase physical activity as tolerated with a goal of at least 150 minutes of moderate physical activity per week.  You can tell that you are exercising at a moderate intensity if your heart starts beating faster and you start  breathing faster but can still hold a conversation. Moderate-intensity exercise ideas include: Walking 1 mile (1.6 km) in about 15 minutes Biking Hiking Golfing Dancing Water aerobics Patient will verbalize understanding of everyday activities that increase physical activity by providing examples like the following: Yard work, such as: PSales promotion account executiveGardening Washing windows or floors Patient will be able to explain general safety guidelines for exercising:  Before you start a new exercise program, talk with your health care provider. Do not exercise so much that you hurt yourself, feel dizzy, or get very short of breath. Wear comfortable clothes and wear shoes with good support. Drink plenty of water while you exercise to prevent dehydration or heat stroke. Work out until your breathing and your heartbeat get faster.          This is a list of Health Maintenance Items that are overdue or due now: There are no preventive care reminders to display for this patient.    Orders/Referrals Placed Today: Orders Placed This Encounter  Procedures   Mammogram 3D SCREEN BREAST BILATERAL    Standing Status:   Future    Standing Expiration Date:   03/04/2022    Scheduling Instructions:     Please call the patient to schedule.    Order Specific Question:   Reason for Exam (SYMPTOM  OR DIAGNOSIS REQUIRED)    Answer:   Breast cancer screening    Order Specific Question:   Preferred imaging location?    [facility]  (  Contact our referral department at [phone] if you have not spoken with someone about your referral appointment within the next 5 days)    Follow-up Plan Follow-up with Donella Stade, PA-C as planned Mammogram referral has been sent and they will call yo uto schedule. Medicare wellness visit in one year. AVS printed and given to the patient.

## 2021-03-04 NOTE — Progress Notes (Signed)
MEDICARE ANNUAL WELLNESS VISIT  03/04/2021  Subjective:  [name] is a 65 y.o. female patient of Tanya Stade, PA-C who had a Medicare Annual Wellness Visit today. [name] is Working full time and lives with their spouse. she has 2-3 children. she reports that she is socially active and does interact with friends/family regularly. she is minimally physically active and enjoys reading and swimming.  Patient Care Team: [name] as PCP - General (Family Medicine) Tanya Pain, MD as PCP - Cardiology (Cardiology) [name] as Physician Assistant (Dermatology)  Advanced Directives 03/04/2021 03/31/2018 03/24/2018 02/14/2018 12/28/2017 02/25/2017 01/07/2016  Does Patient Have a Medical Advance Directive? Yes Yes Yes Yes Yes Yes No  Type of Paramedic of [place];Living will [place];Living will Living will;Healthcare Power of Attorney Living will;Healthcare Power of Attorney Living will [place];Living will -  Does patient want to make changes to medical advance directive? No - Patient declined No - Patient declined No - Patient declined No - Patient declined No - Patient declined No - Patient declined -  Copy of [place] in Chart? Yes - validated most recent copy scanned in chart (See row information) No - copy requested No - copy requested Yes - Yes -  Would patient like information on creating a medical advance directive? - - - - No - Patient declined - No - patient declined information  Pre-existing out of facility DNR order (yellow form or pink MOST form) - - - - - - -    Hospital Utilization Over the Past 12 Months: # of hospitalizations or ER visits: 1 # of surgeries: 1  Review of Systems    Patient reports that her overall health is unchanged when compared to last year.  Review of Systems: History obtained from chart review and the patient  All other systems  negative.  Buckley Assessment Buckley : No/denies Buckley     Current Medications & Allergies (verified) Allergies as of 03/04/2021       Reactions   Percocet [oxycodone-acetaminophen] Other (See Comments)   SYNCOPE TACHYCARDIA   Shrimp [shellfish Allergy] Anaphylaxis, Itching, Nausea And Vomiting, Swelling   Hydrocodone-acetaminophen Nausea And Vomiting, Other (See Comments)   Dizziness, also   Lipitor [atorvastatin] Other (See Comments)   Ear Buckley/fullness in ear "as if I were under water"   Saxenda [liraglutide -weight Management] Hives, Itching, Other (See Comments)   Hives/itching/site reaction   Adhesive [tape] Rash, Other (See Comments)   EKG leads and certain bandages break out the skin   Belviq Xr [lorcaserin Hcl Er] Other (See Comments)   Gained weight    Morphine And Related Nausea And Vomiting        Medication List        Accurate as of March 04, 2021  1:42 PM. If you have any questions, ask your nurse or doctor.          acetaminophen 325 MG tablet Commonly known as: TYLENOL Take 325-650 mg by mouth every 6 (six) hours as needed for mild Buckley or headache.   albuterol 108 (90 Base) MCG/ACT inhaler Commonly known as: VENTOLIN HFA Inhale 2 puffs into the lungs every 6 (six) hours as needed for wheezing or shortness of breath.   B-12 PO Take 1 tablet by mouth daily.   B-6 PO Take 1 tablet by mouth daily.   calcium carbonate 500 MG chewable tablet Commonly known as: TUMS -  dosed in mg elemental calcium Chew 1 tablet by mouth as needed for indigestion or heartburn.   citalopram 20 MG tablet Commonly known as: CELEXA TAKE 1/2 TABLET (10 MG TOTAL) BY MOUTH DAILY.   cyclobenzaprine 5 MG tablet Commonly known as: FLEXERIL TAKE ONE TABLET BY MOUTH THREE TIMES DAILY   cyclobenzaprine 5 MG tablet Commonly known as: FLEXERIL TAKE 1 TABLET (5 MG TOTAL) BY MOUTH 3 (THREE) TIMES DAILY.   diclofenac Sodium 1 % Gel Commonly known as: VOLTAREN APPLY 4 GRAMS  TOPICALLY 4 (FOUR) TIMES DAILY TO AFFECTED JOINT.   ferrous sulfate 325 (65 FE) MG EC tablet Take 1 tablet (325 mg total) by mouth daily with breakfast.   fexofenadine 180 MG tablet Commonly known as: ALLEGRA Take 180 mg by mouth daily.   hydrochlorothiazide 12.5 MG capsule Commonly known as: MICROZIDE Take 1 capsule (12.5 mg total) by mouth daily. NEEDS LABS   levothyroxine 125 MCG tablet Commonly known as: SYNTHROID TAKE 1 TABLET BY MOUTH ONCE DAILY   lisinopril 2.5 MG tablet Commonly known as: ZESTRIL TAKE 1 TABLET (2.5 MG TOTAL) BY MOUTH DAILY.   LUBRICATING EYE DROPS OP Place 1 drop into both eyes as needed (dry eyes).   Ozempic (0.25 or 0.5 MG/DOSE) 2 MG/1.5ML Sopn Generic drug: Semaglutide(0.25 or 0.'5MG'$ /DOS) INJECT 0.'25MG'$  INTO THE SKIN ONCE WEEKLY   pantoprazole 40 MG tablet Commonly known as: PROTONIX TAKE 1 TABLET (40 MG TOTAL) BY MOUTH DAILY.   rosuvastatin 10 MG tablet Commonly known as: CRESTOR Take 1 tablet (10 mg total) by mouth daily.   traMADol 50 MG tablet Commonly known as: ULTRAM Take 50 mg by mouth in the morning and at bedtime.   Vitamin D3 75 MCG (3000 UT) Tabs Take 1,000 Units by mouth daily.        History (reviewed): Past Medical History:  Diagnosis Date   Acute medial meniscal tear    Right knee   Allergy    Anxiety    Aortic insufficiency    Mild   Aortic regurgitation    Arthritis    Carpal tunnel syndrome    Chronic kidney disease    one kidney    Dysrhythmia    pvc's at times   GERD (gastroesophageal reflux disease)    Hyperlipidemia    Hypothyroidism    Migraine headache    Mitral regurgitation    Mitral valve prolapse    Nausea alone    Need for SBE (subacute bacterial endocarditis) prophylaxis    Pilonidal cyst without mention of abscess    Pneumonia Aug 14 2012   PONV (postoperative nausea and vomiting)    states she vasovagals   Sleep apnea    wears CPAP   Thyroid disease    Transplanted kidney removed     donated kidney   Past Surgical History:  Procedure Laterality Date   ABDOMINAL HYSTERECTOMY     ANTERIOR CERVICAL DECOMP/DISCECTOMY FUSION  05/28/2020   CARPAL TUNNEL RELEASE Right    CARPAL TUNNEL RELEASE Left 02/25/2017   Procedure: LEFT OPEN CARPAL TUNNEL RELEASE;  Surgeon: [name], MD;  Location: [facility];  Service: Orthopedics;  Laterality: Left;   CARPAL TUNNEL RELEASE Left 1998   CARPAL TUNNEL RELEASE Right 2019   COLONOSCOPY     KNEE ARTHROSCOPY Right 01/06/2018   Procedure: RIGHT KNEE ARTHROSCOPY WITH PARTIAL  MEDIAL MENISCECTOMY;  Surgeon: [name], MD;  Location: [facility];  Service: Orthopedics;  Laterality: Right;   KNEE SURGERY  LARYNGOSCOPY N/A 05/30/2020   Procedure: LARYNGOSCOPY;  Surgeon: Leta Baptist, MD;  Location: [facility];  Service: ENT;  Laterality: N/A;   NEPHRECTOMY     PARTIAL KNEE ARTHROPLASTY Right 03/31/2018   Procedure: RIGHT KNEE UNI ARTHROPLASTY;  Surgeon: Marchia Bond, MD;  Location: [facility];  Service: Orthopedics;  Laterality: Right;   TONSILLECTOMY     TOTAL KNEE ARTHROPLASTY Left 09/14/2013   Procedure: LEFT TOTAL KNEE ARTHROPLASTY;  Surgeon: [name], MD;  Location: [facility];  Service: Orthopedics;  Laterality: Left;   TUBAL LIGATION     WISDOM TOOTH EXTRACTION     WOUND EXPLORATION N/A 05/28/2020   Procedure: ANTERIOR CERVICAL WOUND EXPLORATION;  Surgeon: Erline Levine, MD;  Location: [facility];  Service: Neurosurgery;  Laterality: N/A;   Family History  Problem Relation Age of Onset   Diabetes Mother    Hypertension Mother    Heart disease Father    Hypertension Father    Thyroid disease Father    AAA (abdominal aortic aneurysm) Father    Cancer Maternal Aunt    Cancer Maternal Aunt    Diabetes Sister    Fibromyalgia Sister    Obstructive Sleep Apnea Sister    Kidney disease Daughter    Heart disease Son    Colon cancer Neg Hx    Esophageal cancer Neg Hx    Rectal cancer Neg Hx    Stomach  cancer Neg Hx    Social History   Socioeconomic History   Marital status: Married    Spouse name: Tom   Number of children: 7   Years of education: 23   Highest education level: Conservator, museum/gallery (e.g., MA, MS, MEng, MEd, MSW, MBA)  Occupational History    Employer: [employer]  Tobacco Use   Smoking status: Former    Types: Cigarettes    Quit date: 11/22/1983    Years since quitting: 37.3   Smokeless tobacco: Never  Vaping Use   Vaping Use: Never used  Substance and Sexual Activity   Alcohol use: Yes    Comment: Rarely   Drug use: No   Sexual activity: Yes    Partners: Male  Other Topics Concern   Not on file  Social History Narrative   Marital Status:  Married (Tom)Children: 7Pets:  Dogs (2)Living Situation: Lives with spouse, son and his family.  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[place])  Education:  Forensic psychologist; MSN Tobacco Use/Exposure: NoneAlcohol Use: RarelyDrug Use:  NoneDiet:  Low Fat Exercise:  Limited (Walking Dogs) Hobbies:  Reading, Geneticist, molecular    Social Determinants of Radio broadcast assistant Strain: Low Risk    Difficulty of Paying Living Expenses: Not hard at all  Food Insecurity: No Food Insecurity   Worried About Charity fundraiser in the Last Year: Never true   Arboriculturist in the Last Year: Never true  Transportation Needs: No Transportation Needs   Lack of Transportation (Medical): No   Lack of Transportation (Non-Medical): No  Physical Activity: Insufficiently Active   Days of Exercise per Week: 2 days   Minutes of Exercise per Session: 50 min  Stress: No Stress Concern Present   Feeling of Stress : Not at all  Social Connections: Socially Integrated   Frequency of Communication with Friends and Family: More than three times a week   Frequency of Social Gatherings with Friends and Family: Three times a week   Attends Religious Services: 1 to 4 times per year  Active Member of Clubs or Organizations: Yes   Attends English as a second language teacher Meetings: More than 4 times per year   Marital Status: Married    Activities of Daily Living In your present state of health, do you have any difficulty performing the following activities: 03/04/2021  Hearing? Y  Comment little hearing loss bilaterally  Vision? N  Difficulty concentrating or making decisions? N  Walking or climbing stairs? N  Dressing or bathing? N  Doing errands, shopping? N  Preparing Food and eating ? N  Using the Toilet? N  In the past six months, have you accidently leaked urine? N  Do you have problems with loss of bowel control? N  Managing your Medications? N  Managing your Finances? N  Housekeeping or managing your Housekeeping? N  Some recent data might be hidden    Patient Education/Literacy How often do you need to have someone help you when you read instructions, pamphlets, or other written materials from your doctor or pharmacy?: 1 - Never What is the last grade level you completed in school?: Master's degree  Exercise Current Exercise Habits: Home exercise routine, Type of exercise: Other - see comments (swimming), Time (Minutes): 60, Frequency (Times/Week): 2, Weekly Exercise (Minutes/Week): 120, Intensity: Mild, Exercise limited by: None identified  Diet Patient reports consuming  2-3  meals a day and 1 snack(s) a day Patient reports that her primary diet is: Regular Patient reports that she does have regular access to food.   Depression Screen PHQ 2/9 Scores 03/04/2021 10/14/2020 01/05/2020 03/09/2019 11/02/2018 03/08/2018 06/17/2017  PHQ - 2 Score 0 0 0 0 0 0 0  PHQ- 9 Score - 0 0 0 0 0 -     Fall Risk Fall Risk  03/04/2021 01/05/2020  Falls in the past year? 0 0  Number falls in past yr: 0 0  Injury with Fall? 0 0  Risk for fall due to : History of fall(s) -  Follow up Falls evaluation completed Falls evaluation completed     Objective:   BP (!) 129/58 (BP Location: Right Arm, Patient Position: Sitting, Cuff Size: Large)    Pulse 71   Ht 5' 4.5" (1.638 m)   Wt 226 lb 0.6 oz (102.5 kg)   SpO2 97%   BMI 38.20 kg/m   Last Weight  Most recent update: 03/04/2021  1:06 PM    Weight  102.5 kg (226 lb 0.6 oz)             Body mass index is 38.2 kg/m.  Hearing/Vision  Kayala did not have difficulty with hearing/understanding during the face-to-face interview Joselene did not have difficulty with her vision during the face-to-face interview Reports that she has had a formal eye exam by an eye care professional within the past year Reports that she has not had a formal hearing evaluation within the past year  Cognitive Function: 6CIT Screen 03/04/2021  What Year? 0 points  What month? 0 points  What time? 0 points  Count back from 20 0 points  Months in reverse 0 points  Repeat phrase 0 points  Total Score 0    Normal Cognitive Function Screening: Yes (Normal:0-7, Significant for Dysfunction: >8)  Immunization & Health Maintenance Record Immunization History  Administered Date(s) Administered   Influenza Whole 08/14/1999   Influenza-Unspecified 04/10/2016, 05/16/2018   PFIZER(Purple Top)SARS-COV-2 Vaccination 08/02/2019, 08/23/2019, 04/29/2020   Pneumococcal Conjugate-13 01/25/2014   Pneumococcal Polysaccharide-23 04/11/2020   Tdap 08/11/2003, 09/28/2011   Zoster Recombinat (Shingrix) 06/16/2017,  12/13/2017   Zoster, Live 05/24/2012    Health Maintenance  Topic Date Due   COVID-19 Vaccine (4 - Booster for Pfizer series) 03/20/2021 (Originally 07/29/2020)   INFLUENZA VACCINE  03/10/2021   TETANUS/TDAP  09/27/2021   MAMMOGRAM  02/05/2022   COLONOSCOPY (Pts 45-55yr Insurance coverage will need to be confirmed)  05/13/2023   DEXA SCAN  Completed   Hepatitis C Screening  Completed   PNA vac Low Risk Adult  Completed   Zoster Vaccines- Shingrix  Completed   HPV VACCINES  Aged Out       Assessment  This is a routine wellness examination for [employer]  Health Maintenance: Due or  Overdue There are no preventive care reminders to display for this patient.   KLuiz Ochoadoes not need a referral for CCommercial Metals CompanyAssistance: Care Management:   no Social Work:    no Prescription Assistance:  no Nutrition/Diabetes Education:  no   Plan:  Personalized Goals  Goals Addressed               This Visit's Progress     Patient Stated (pt-stated)        03/04/2021 AWV Goal: Exercise for General Health  Patient will verbalize understanding of the benefits of increased physical activity: Exercising regularly is important. It will improve your overall fitness, flexibility, and endurance. Regular exercise also will improve your overall health. It can help you control your weight, reduce stress, and improve your bone density. Over the next year, patient will increase physical activity as tolerated with a goal of at least 150 minutes of moderate physical activity per week.  You can tell that you are exercising at a moderate intensity if your heart starts beating faster and you start breathing faster but can still hold a conversation. Moderate-intensity exercise ideas include: Walking 1 mile (1.6 km) in about 15 minutes Biking Hiking Golfing Dancing Water aerobics Patient will verbalize understanding of everyday activities that increase physical activity by providing examples like the following: Yard work, such as: PSales promotion account executiveGardening Washing windows or floors Patient will be able to explain general safety guidelines for exercising:  Before you start a new exercise program, talk with your health care provider. Do not exercise so much that you hurt yourself, feel dizzy, or get very short of breath. Wear comfortable clothes and wear shoes with good support. Drink plenty of water while you exercise to prevent dehydration or heat stroke. Work out until your breathing and your  heartbeat get faster.        Personalized Health Maintenance & Screening Recommendations  Screening mammography  Lung Cancer Screening Recommended: no (Low Dose CT Chest recommended if Age 66-80years, 30 pack-year currently smoking OR have quit w/in past 15 years) Hepatitis C Screening recommended: no HIV Screening recommended: no  Advanced Directives: Written information was not given per the patient's request.  Referrals & Orders Orders Placed This Encounter  Procedures   Mammogram 3D SCREEN BREAST BILATERAL     Follow-up Plan Follow-up with BDonella Stade PA-C as planned Mammogram referral has been sent and they will call yo uto schedule. Medicare wellness visit in one year. AVS printed and given to the patient.   I have personally reviewed and noted the following in the patient's chart:   Medical and social history Use of alcohol, tobacco or illicit drugs  Current medications and supplements Functional ability and  status Nutritional status Physical activity Advanced directives List of other physicians Hospitalizations, surgeries, and ER visits in previous 12 months Vitals Screenings to include cognitive, depression, and falls Referrals and appointments  In addition, I have reviewed and discussed with patient certain preventive protocols, quality metrics, and best practice recommendations. A written personalized care plan for preventive services as well as general preventive health recommendations were provided to patient.     Tinnie Gens, RN  03/04/2021

## 2021-03-06 ENCOUNTER — Other Ambulatory Visit: Payer: Self-pay | Admitting: Physician Assistant

## 2021-03-06 DIAGNOSIS — Z1231 Encounter for screening mammogram for malignant neoplasm of breast: Secondary | ICD-10-CM

## 2021-03-07 ENCOUNTER — Other Ambulatory Visit: Payer: Self-pay

## 2021-03-07 ENCOUNTER — Encounter: Payer: Self-pay | Admitting: Physician Assistant

## 2021-03-07 ENCOUNTER — Ambulatory Visit
Admission: RE | Admit: 2021-03-07 | Discharge: 2021-03-07 | Disposition: A | Discharge status: Home / Self Care | Payer: Medicare Other | Source: Ambulatory Visit

## 2021-03-07 ENCOUNTER — Other Ambulatory Visit (HOSPITAL_BASED_OUTPATIENT_CLINIC_OR_DEPARTMENT_OTHER): Payer: Self-pay

## 2021-03-07 ENCOUNTER — Ambulatory Visit (INDEPENDENT_AMBULATORY_CARE_PROVIDER_SITE_OTHER): Payer: Medicare Other | Admitting: Physician Assistant

## 2021-03-07 VITALS — BP 137/61 | HR 51 | Temp 98.1°F

## 2021-03-07 DIAGNOSIS — I10 Essential (primary) hypertension: Secondary | ICD-10-CM | POA: Diagnosis not present

## 2021-03-07 DIAGNOSIS — L720 Epidermal cyst: Secondary | ICD-10-CM

## 2021-03-07 DIAGNOSIS — D509 Iron deficiency anemia, unspecified: Secondary | ICD-10-CM

## 2021-03-07 DIAGNOSIS — F411 Generalized anxiety disorder: Secondary | ICD-10-CM

## 2021-03-07 DIAGNOSIS — N289 Disorder of kidney and ureter, unspecified: Secondary | ICD-10-CM

## 2021-03-07 DIAGNOSIS — E039 Hypothyroidism, unspecified: Secondary | ICD-10-CM | POA: Diagnosis not present

## 2021-03-07 DIAGNOSIS — Z1231 Encounter for screening mammogram for malignant neoplasm of breast: Secondary | ICD-10-CM

## 2021-03-07 DIAGNOSIS — E611 Iron deficiency: Secondary | ICD-10-CM

## 2021-03-07 MED ORDER — FUSION PLUS PO CAPS
1.0000 | ORAL_CAPSULE | Freq: Every day | ORAL | 3 refills | Status: DC
  Filled 2021-03-07: qty 30, 30d supply, fill #0
  Filled 2021-05-11: qty 30, 30d supply, fill #1

## 2021-03-07 MED ORDER — LEVOTHYROXINE SODIUM 125 MCG PO TABS
ORAL_TABLET | Freq: Every day | ORAL | 1 refills | Status: DC
  Filled 2021-03-07: qty 90, fill #0
  Filled 2021-04-10: qty 90, 90d supply, fill #0

## 2021-03-07 MED ORDER — CITALOPRAM HYDROBROMIDE 20 MG PO TABS
ORAL_TABLET | ORAL | 1 refills | Status: DC
Start: 2021-03-07 — End: 2021-10-06
  Filled 2021-03-07 – 2021-04-16 (×2): qty 45, 90d supply, fill #0

## 2021-03-07 MED ORDER — LISINOPRIL 2.5 MG PO TABS
ORAL_TABLET | Freq: Every day | ORAL | 1 refills | Status: DC
Start: 2021-03-07 — End: 2021-10-06
  Filled 2021-03-07: qty 90, fill #0
  Filled 2021-04-16: qty 90, 90d supply, fill #0

## 2021-03-07 MED ORDER — HYDROCHLOROTHIAZIDE 12.5 MG PO CAPS
12.5000 mg | ORAL_CAPSULE | Freq: Every day | ORAL | 1 refills | Status: DC
Start: 2021-03-07 — End: 2021-10-13
  Filled 2021-03-07: qty 90, 90d supply, fill #0

## 2021-03-07 NOTE — Patient Instructions (Signed)
Epidermoid Cyst  An epidermoid cyst, also known as epidermal cyst, is a sac made of skin tissue. The sac contains a substance called keratin. Keratin is a protein that is normally secreted through the hair follicles. When keratin becomes trapped in the top layer of skin (epidermis), it can form an epidermoid cyst. Epidermoid cysts can be found anywhere on your body. These cysts are usually harmless (benign), and they may not cause symptoms unless they become inflamed or infected. What are the causes? This condition may be caused by: A blocked hair follicle. A hair that curls and re-enters the skin instead of growing straight out of the skin (ingrown hair). A blocked pore. Irritated skin. An injury to the skin. Certain conditions that are passed along from parent to child (inherited). Human papillomavirus (HPV). This happens rarely when cysts occur on the bottom of the feet. Long-term (chronic) sun damage to the skin. What increases the risk? The following factors may make you more likely to develop an epidermoid cyst: Having acne. Being female. Having an injury to the skin. Being past puberty. Having certain rare genetic disorders. What are the signs or symptoms? The only symptom of this condition may be a small, painless lump underneath the skin. When an epidermal cyst ruptures, it may become inflamed. True infection in cysts is rare. Symptoms may include: Redness. Inflammation. Tenderness. Warmth. Keratin draining from the cyst. Keratin is grayish-white, bad-smelling substance. Pus draining from the cyst. How is this diagnosed? This condition is diagnosed with a physical exam. In some cases, you may have a sample of tissue (biopsy) taken from your cyst to be examined under a microscope or tested for bacteria. You may be referred to a health care provider who specializes in skin care (dermatologist). How is this treated? If a cyst becomes inflamed, treatment may include: Opening and  draining the cyst, done by a health care provider. After draining, minor surgery to remove the rest of the cyst may be done. Taking antibiotic medicine. Having injections of medicines (steroids) that help to reduce inflammation. Having surgery to remove the cyst. Surgery may be done if the cyst: Becomes large. Bothers you. Has a chance of turning into cancer. Do not try to open a cyst yourself. Follow these instructions at home: Medicines If you were prescribed an antibiotic medicine, take it it as told by your health care provider. Do not stop using the antibiotic even if you start to feel better. Take over-the-counter and prescription medicines only as told by your health care provider. General instructions Keep the area around your cyst clean and dry. Wear loose, dry clothing. Avoid touching your cyst. Check your cyst every day for signs of infection. Check for: Redness, swelling, or pain. Fluid or blood. Warmth. Pus or a bad smell. Keep all follow-up visits. This is important. How is this prevented? Wear clean, dry, clothing. Avoid wearing tight clothing. Keep your skin clean and dry. Take showers or baths every day. Contact a health care provider if: Your cyst develops symptoms of infection. Your condition is not improving or is getting worse. You develop a cyst that looks different from other cysts you have had. You have a fever. Get help right away if: Redness spreads from the cyst into the surrounding area. Summary An epidermoid cyst is a sac made of skin tissue. These cysts are usually harmless (benign), and they may not cause symptoms unless they become inflamed. If a cyst becomes inflamed, treatment may include surgery to open and drain the   cyst, or to remove it. Treatment may also include medicines by mouth or through an injection. Take over-the-counter and prescription medicines only as told by your health care provider. If you were prescribed an antibiotic medicine,  take it as told by your health care provider. Do not stop using the antibiotic even if you start to feel better. Contact a health care provider if your condition is not improving or is getting worse. Keep all follow-up visits as told by your health care provider. This is important. This information is not intended to replace advice given to you by your health care provider. Make sure you discuss any questions you have with your healthcare provider. Document Revised: 11/01/2019 Document Reviewed: 11/01/2019 Elsevier Patient Education  [place].

## 2021-03-07 NOTE — Progress Notes (Signed)
Subjective:    Patient ID: [name], female    DOB: 1955-10-02, 65 y.o.   MRN: [id number]  HPI Pt is a 65 yo female who presents to the clinic with a lump in right axillary that noticed about 10 days ago. Somewhat tender but no drainage. Seems to be getting a little better.   She does need some refills for HTN, anxiety, hypothyroidism, [name].   Overall no problems.   She has noticed more nausea with iron. Would like to discuss other options.  .. Active Ambulatory Problems    Diagnosis Date Noted   UTI (urinary tract infection) 07/14/2012   Other and unspecified hyperlipidemia 07/14/2012   MVP (mitral valve prolapse) 07/14/2012   GERD (gastroesophageal reflux disease) 07/14/2012   Allergic rhinitis 07/14/2012   Mitral regurgitation 07/14/2012   S/P hysterectomy 07/14/2012   Essential hypertension, benign 04/29/2013   Wheezing 04/29/2013   Insomnia 04/29/2013   Unspecified vitamin D deficiency 04/29/2013   Migraine headache 04/29/2013   Anxiety state 04/29/2013   Unspecified hypothyroidism 04/29/2013   Obesity (BMI 30-39.9) 07/17/2013   Cough 08/15/2013   Arthritis of left knee 09/14/2013   Status post total knee replacement 09/14/2013   Abdominal pain, chronic, right upper quadrant 11/16/2013   Candidiasis of female genitalia 11/16/2013   Elevated liver enzymes [id number]   Folliculitis [id number]   Nausea alone 11/18/2013   Abnormal transaminases 11/18/2013   Hyperlipidemia 11/22/2014   Single kidney 11/22/2014   Hypothyroidism 11/22/2014   OSA (obstructive sleep apnea) 12/23/2015   Daytime sleepiness 01/24/2017   Carpal tunnel syndrome, left upper limb 02/25/2017   Status post carpal tunnel release 03/11/2017   Hypertriglyceridemia 07/14/2017   Elevated fasting glucose 07/14/2017   Chronic pain of right knee 09/13/2017   Unilateral primary osteoarthritis, right knee 09/27/2017   Prediabetes 10/19/2017   Pain in thumb joint with movement of right hand  10/19/2017   Class 2 obesity due to excess calories without serious comorbidity with body mass index (BMI) of 35.0 to 35.9 in adult 10/19/2017   Other meniscus derangements, posterior horn of medial meniscus, right knee 11/24/2017   Pain of right thumb 11/24/2017   Trigger thumb, right thumb 11/24/2017   BPPV (benign paroxysmal positional vertigo), right 02/20/2018   Concussion wth loss of consciousness of 30 minutes or less 02/20/2018   Fall 02/20/2018   CKD (chronic kidney disease) stage 3, GFR 30-59 ml/min (HCC) 03/10/2019   BPPV (benign paroxysmal positional vertigo), left 05/22/2019   Pain radiating to back 05/23/2019   Elevated lipase 06/12/2019   Seborrheic keratoses 01/05/2020   Papules 01/05/2020   Class 2 obesity due to excess calories without serious comorbidity with body mass index (BMI) of 37.0 to 37.9 in adult 01/05/2020   Anxiety and depression 06/03/2014   Fracture of triquetral bone of wrist 02/21/2018   Primary osteoarthritis involving multiple joints 06/03/2014   Degenerative disc disease, cervical 04/11/2020   Right arm pain 04/16/2020   Right arm weakness 04/16/2020   Iron deficiency 04/17/2020   Airway compromise 05/28/2020   Laryngospasm 05/28/2020   Status post cervical spinal fusion 06/04/2020   SOB (shortness of breath) 06/04/2020   History of laryngeal spasm 06/04/2020   Atelectasis 06/04/2020   Frequent headaches 10/14/2020   Epidermal cyst 03/07/2021   Abnormal kidney function 03/10/2021   Iron deficiency anemia 03/10/2021   Resolved Ambulatory Problems    Diagnosis Date Noted   Hypothyroidism 07/14/2012   Past Medical History:  Diagnosis Date  Acute medial meniscal tear    Allergy    Anxiety    Aortic insufficiency    Aortic regurgitation    Arthritis    Carpal tunnel syndrome    Chronic kidney disease    Dysrhythmia    Mitral valve prolapse    Need for SBE (subacute bacterial endocarditis) prophylaxis    Pilonidal cyst without mention  of abscess    Pneumonia Aug 14 2012   PONV (postoperative nausea and vomiting)    Sleep apnea    Thyroid disease    Transplanted kidney removed     Review of Systems  All other systems reviewed and are negative.     Objective:   Physical Exam Vitals reviewed.  Constitutional:      Appearance: Normal appearance. She is obese.  HENT:     Head: Normocephalic.  Cardiovascular:     Rate and Rhythm: Normal rate and regular rhythm.  Pulmonary:     Effort: Pulmonary effort is normal.     Breath sounds: Normal breath sounds.  Musculoskeletal:     Right lower leg: No edema.     Left lower leg: No edema.     Comments: Right axilla small pea size superficial cyst with punctate middle.   Neurological:     General: No focal deficit present.     Mental Status: She is alert and oriented to person, place, and time.  Psychiatric:        Mood and Affect: Mood normal.    .. Depression screen [facility] 2/9 03/07/2021 03/04/2021 10/14/2020 01/05/2020 03/09/2019  Decreased Interest 0 0 0 0 0  Down, Depressed, Hopeless 0 0 0 0 0  PHQ - 2 Score 0 0 0 0 0  Altered sleeping - - 0 0 0  Tired, decreased energy - - 0 0 0  Change in appetite - - 0 0 0  Feeling bad or failure about yourself  - - 0 0 0  Trouble concentrating - - 0 0 0  Moving slowly or fidgety/restless - - 0 0 0  Suicidal thoughts - - 0 0 0  PHQ-9 Score - - 0 0 0  Difficult doing work/chores - - Not difficult at all Not difficult at all Not difficult at all   .Marland Kitchen GAD 7 : Generalized Anxiety Score 10/14/2020 01/05/2020 03/09/2019 11/02/2018  Nervous, Anxious, on Edge 0 0 0 0  Control/stop worrying 0 0 0 0  Worry too much - different things 0 0 0 0  Trouble relaxing 0 0 0 0  Restless 0 0 0 0  Easily annoyed or irritable 0 0 0 0  Afraid - awful might happen 0 0 0 0  Total GAD 7 Score 0 0 0 0  Anxiety Difficulty Not difficult at all Not difficult at all Not difficult at all Not difficult at all          Assessment & Plan:  Marland KitchenMarland KitchenKeina was  seen today for mass.  Diagnoses and all orders for this visit:  Epidermal cyst  Essential hypertension, benign -     hydrochlorothiazide (MICROZIDE) 12.5 MG capsule; Take 1 capsule (12.5 mg total) by mouth daily. -     lisinopril (ZESTRIL) 2.5 MG tablet; TAKE 1 TABLET (2.5 MG TOTAL) BY MOUTH DAILY.  Anxiety state -     citalopram (CELEXA) 20 MG tablet; TAKE 1/2 TABLET (10 MG TOTAL) BY MOUTH DAILY.  Hypothyroidism, unspecified type -     levothyroxine (SYNTHROID) 125 MCG tablet; TAKE 1 TABLET BY  MOUTH ONCE DAILY  Iron deficiency -     Iron-FA-B Cmp-C-Biot-Probiotic (FUSION PLUS) CAPS; Take 1 capsule by mouth daily.  Abnormal kidney function -     COMPLETE METABOLIC PANEL WITH GFR  Iron deficiency anemia, unspecified iron deficiency anemia type -     CBC w/Diff/Platelet  Reassurance given about epidermal cyst. Use witch hazel and follow up with dermatology if wants removed.   Stop ferrous sulfate and try fusion plus.   BP great today. Refilled medication.  Needs cmp and cbc rechecked.   Refilled celexa for mood.

## 2021-03-10 ENCOUNTER — Telehealth: Payer: Self-pay | Admitting: Physician Assistant

## 2021-03-10 DIAGNOSIS — D509 Iron deficiency anemia, unspecified: Secondary | ICD-10-CM | POA: Insufficient documentation

## 2021-03-10 DIAGNOSIS — N289 Disorder of kidney and ureter, unspecified: Secondary | ICD-10-CM | POA: Insufficient documentation

## 2021-03-10 NOTE — Telephone Encounter (Signed)
Mychart message

## 2021-03-10 NOTE — Progress Notes (Signed)
Normal mammogram. Follow up in 1 year.

## 2021-03-17 ENCOUNTER — Ambulatory Visit (INDEPENDENT_AMBULATORY_CARE_PROVIDER_SITE_OTHER): Payer: Medicare Other | Admitting: Orthopaedic Surgery

## 2021-03-17 ENCOUNTER — Other Ambulatory Visit (HOSPITAL_BASED_OUTPATIENT_CLINIC_OR_DEPARTMENT_OTHER): Payer: Self-pay

## 2021-03-17 ENCOUNTER — Encounter: Payer: Self-pay | Admitting: Orthopaedic Surgery

## 2021-03-17 ENCOUNTER — Other Ambulatory Visit: Payer: Self-pay

## 2021-03-17 DIAGNOSIS — M7661 Achilles tendinitis, right leg: Secondary | ICD-10-CM | POA: Diagnosis not present

## 2021-03-17 MED ORDER — PREDNISONE 50 MG PO TABS
ORAL_TABLET | ORAL | 0 refills | Status: DC
  Filled 2021-03-17: qty 5, 5d supply, fill #0

## 2021-03-17 NOTE — Progress Notes (Signed)
[name] comes in today with acute right ankle pain around her Achilles area.  This is been getting worse for several days now.  She reports swelling but no known injury.  It has been painful to walk.  She is never had surgery on this area.  She is not a diabetic.  She has had no otherwise acute change in her medical status.  There is no listed headache, chest pain, shortness of breath, fever, chills, nausea, vomiting  On examination, there is swelling around her right Achilles tendon.  Her [place] test is negative fortunately.  There is pain along the course of the Achilles tendon.  Her foot is well-perfused and neurovascularly intact.  She does have severe right Achilles tendinitis.  I will have her try Voltaren gel over the Achilles tendon release twice daily and put her on 50 mg of prednisone for the next 5 days.  I am going to also have her stretch her Achilles using a Thera-Band.  We did give her a wedge to put in her heel to build this up to take pressure off the Achilles as well.  She will wear that at least for the next 1 to 4 weeks depending on her progress with her Achilles.  All questions and concerns were answered addressed.  Follow-up can be as needed.  She will call me if this is not getting better.

## 2021-03-19 ENCOUNTER — Other Ambulatory Visit (HOSPITAL_BASED_OUTPATIENT_CLINIC_OR_DEPARTMENT_OTHER): Payer: Self-pay

## 2021-03-19 MED ORDER — AMOXICILLIN 500 MG PO CAPS
ORAL_CAPSULE | ORAL | 4 refills | Status: DC
  Filled 2021-03-19: qty 4, 1d supply, fill #0

## 2021-04-01 ENCOUNTER — Other Ambulatory Visit (HOSPITAL_BASED_OUTPATIENT_CLINIC_OR_DEPARTMENT_OTHER): Payer: Self-pay

## 2021-04-10 ENCOUNTER — Other Ambulatory Visit (HOSPITAL_BASED_OUTPATIENT_CLINIC_OR_DEPARTMENT_OTHER): Payer: Self-pay

## 2021-04-16 MED FILL — Pantoprazole Sodium EC Tab 40 MG (Base Equiv): ORAL | 90 days supply | Qty: 90 | Fill #1 | Status: AC

## 2021-04-17 ENCOUNTER — Other Ambulatory Visit (HOSPITAL_BASED_OUTPATIENT_CLINIC_OR_DEPARTMENT_OTHER): Payer: Self-pay

## 2021-05-12 ENCOUNTER — Other Ambulatory Visit (HOSPITAL_BASED_OUTPATIENT_CLINIC_OR_DEPARTMENT_OTHER): Payer: Self-pay

## 2021-05-30 ENCOUNTER — Other Ambulatory Visit (HOSPITAL_BASED_OUTPATIENT_CLINIC_OR_DEPARTMENT_OTHER): Payer: Self-pay

## 2021-06-04 ENCOUNTER — Other Ambulatory Visit (HOSPITAL_BASED_OUTPATIENT_CLINIC_OR_DEPARTMENT_OTHER): Payer: Self-pay

## 2021-06-21 IMAGING — DX DG CHEST 1V PORT
1 series · 1 of 1 positions shown · non-contrast
Comparison: 05/28/2020

CLINICAL DATA: Acute respiratory failure with hypoxemia.

EXAM:
PORTABLE CHEST 1 VIEW

[chest]
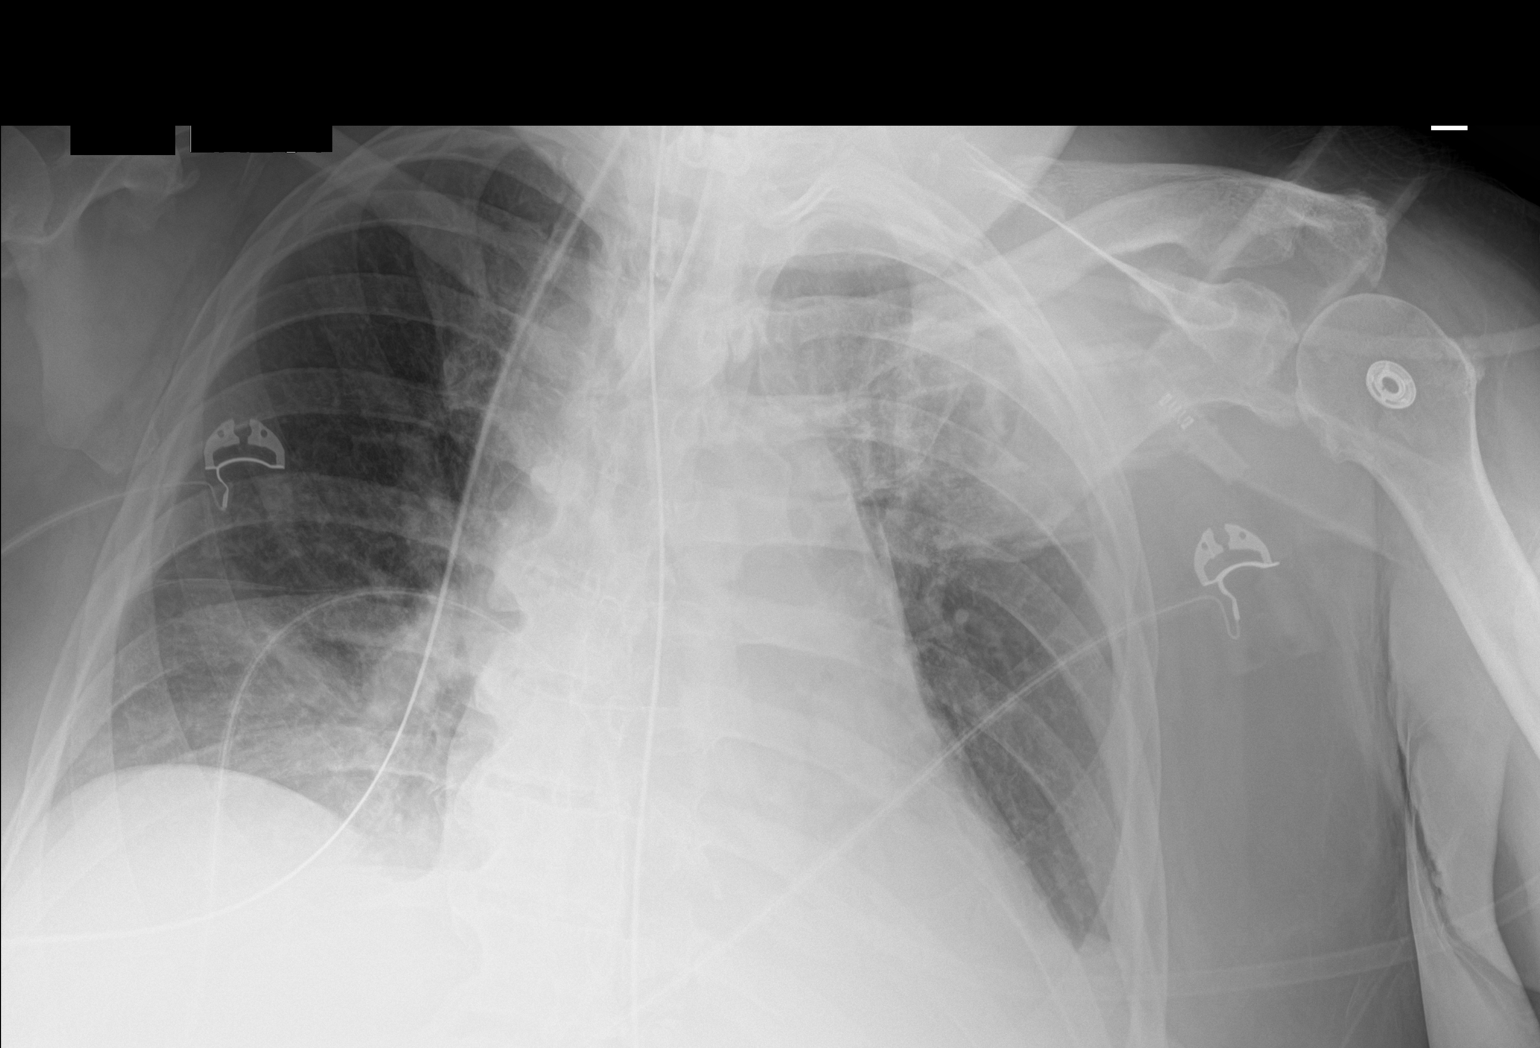

[1 of 1 positions shown; findings below may reference images not displayed]

FINDINGS: Endotracheal tube and enteric tube are unchanged in position.
Shallow inspiration. Cardiac enlargement. Probable consolidation or
atelectasis in the left lung base behind the heart. Infiltration in
the right lung base is unchanged. Small left pleural effusion.
IMPRESSION: Appliances are unchanged in position. Cardiac enlargement.
Consolidation or atelectasis in the left lung base behind the heart
with small left pleural effusion. Infiltration in the right lung
base is unchanged.

## 2021-06-26 IMAGING — DX DG CHEST 2V
2 series · 2 of 2 positions shown · non-contrast
Comparison: May 30, 2020

CLINICAL DATA: Cough and shortness of breath. Recent cervical
fusion

EXAM:
CHEST - 2 VIEW

[chest pa]
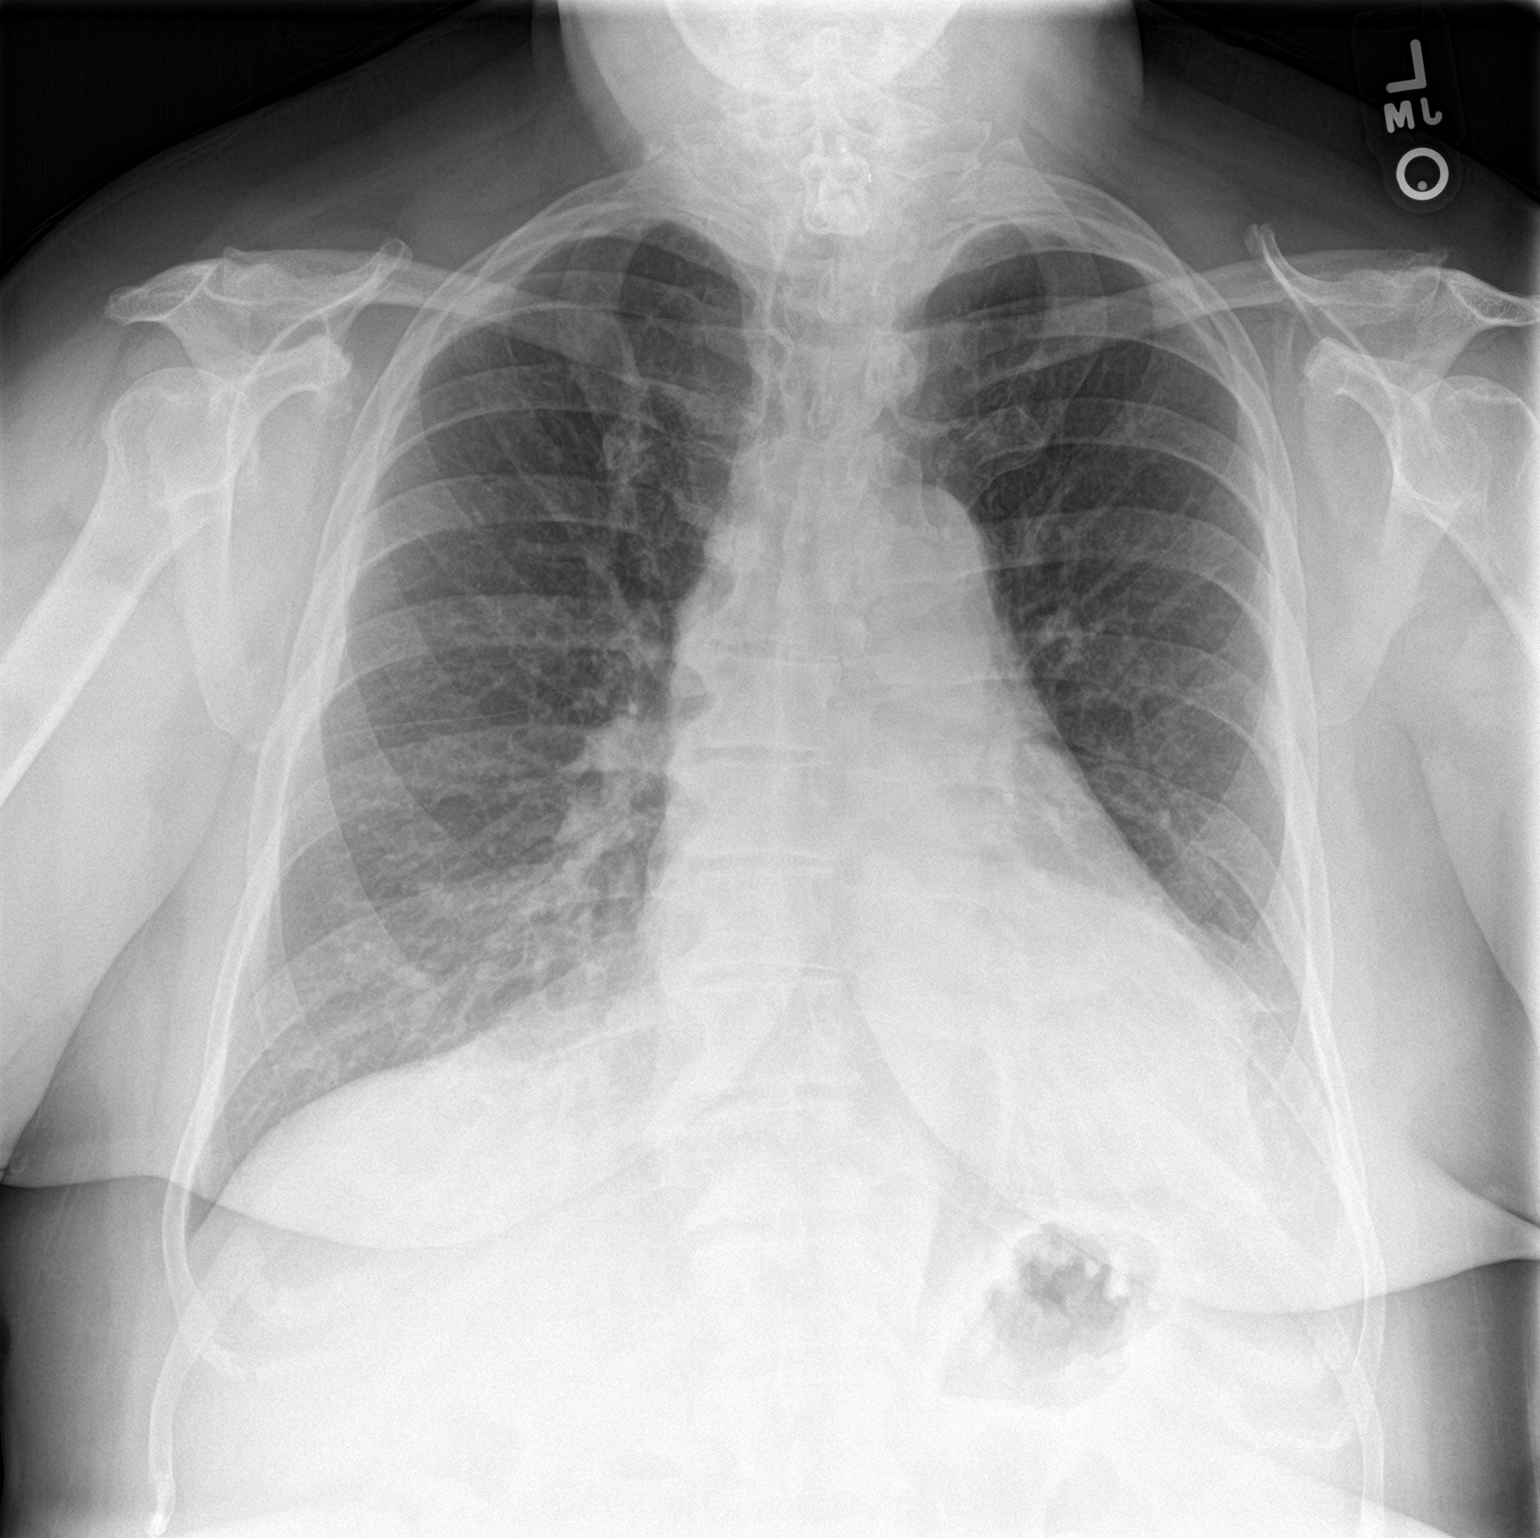

[chest lat]
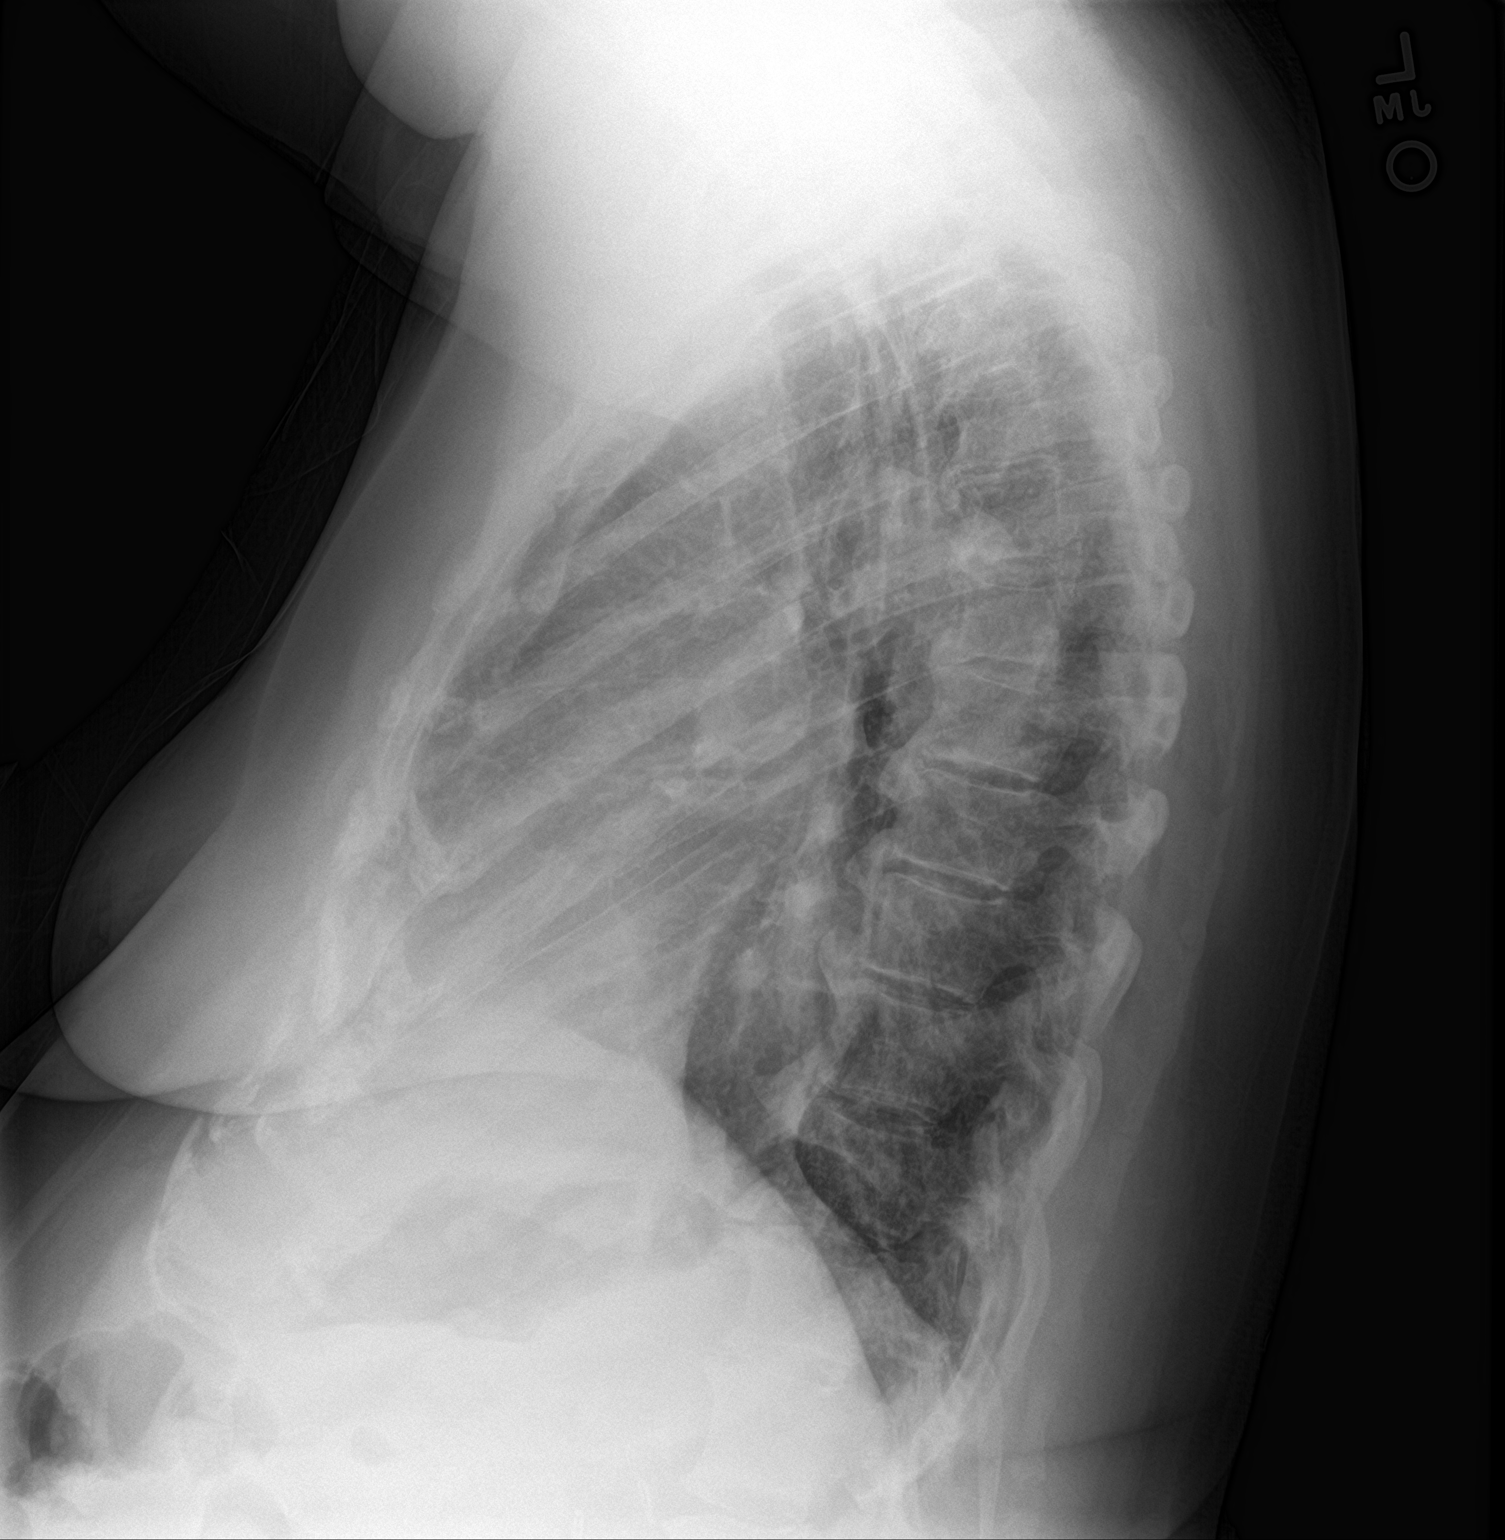

[2 of 2 positions shown; findings below may reference images not displayed]

FINDINGS: There is mild atelectatic change in the left base. The lungs
elsewhere are clear. Heart is upper normal in size with pulmonary
vascularity normal. No adenopathy. There is degenerative change in
the thoracic spine. There is postoperative change at the
cervicothoracic junction level.
IMPRESSION: Mild atelectasis left base. Lungs elsewhere clear. Cardiac
silhouette within normal limits. No adenopathy appreciable.

## 2021-07-29 ENCOUNTER — Telehealth: Payer: Self-pay | Admitting: Physician Assistant

## 2021-07-29 ENCOUNTER — Other Ambulatory Visit: Payer: Self-pay | Admitting: Physician Assistant

## 2021-07-29 ENCOUNTER — Other Ambulatory Visit (HOSPITAL_BASED_OUTPATIENT_CLINIC_OR_DEPARTMENT_OTHER): Payer: Self-pay

## 2021-07-29 MED ORDER — METHYLPREDNISOLONE 4 MG PO TBPK
ORAL_TABLET | ORAL | 0 refills | Status: DC
  Filled 2021-07-29: qty 21, 6d supply, fill #0

## 2021-07-29 NOTE — Telephone Encounter (Signed)
Patient called advised she spoke with Dr Ninfa Linden and was told to leave a message for Artis Delay stating she will need a Rx for Prednisone and a heel lift again.  [facility] uses the [facility] in [place] on [address]. Ph# is [phone] The number to contact patient is [phone]

## 2021-07-29 NOTE — Telephone Encounter (Signed)
Please advise 

## 2021-07-29 NOTE — Progress Notes (Unsigned)
medrol 

## 2021-07-30 ENCOUNTER — Other Ambulatory Visit (HOSPITAL_BASED_OUTPATIENT_CLINIC_OR_DEPARTMENT_OTHER): Payer: Self-pay

## 2021-07-30 NOTE — Telephone Encounter (Signed)
Called pt was advise about medication and to come by and pick up the heel lift

## 2021-08-29 ENCOUNTER — Other Ambulatory Visit: Payer: Self-pay

## 2021-08-29 DIAGNOSIS — E781 Pure hyperglyceridemia: Secondary | ICD-10-CM

## 2021-08-29 DIAGNOSIS — E782 Mixed hyperlipidemia: Secondary | ICD-10-CM

## 2021-09-04 ENCOUNTER — Other Ambulatory Visit (HOSPITAL_BASED_OUTPATIENT_CLINIC_OR_DEPARTMENT_OTHER): Payer: Self-pay

## 2021-09-05 ENCOUNTER — Telehealth (INDEPENDENT_AMBULATORY_CARE_PROVIDER_SITE_OTHER): Payer: Medicare Other | Admitting: Physician Assistant

## 2021-09-05 ENCOUNTER — Encounter: Payer: Self-pay | Admitting: Physician Assistant

## 2021-09-05 DIAGNOSIS — E782 Mixed hyperlipidemia: Secondary | ICD-10-CM

## 2021-09-05 DIAGNOSIS — E781 Pure hyperglyceridemia: Secondary | ICD-10-CM | POA: Diagnosis not present

## 2021-09-05 DIAGNOSIS — R300 Dysuria: Secondary | ICD-10-CM

## 2021-09-05 DIAGNOSIS — R350 Frequency of micturition: Secondary | ICD-10-CM

## 2021-09-05 MED ORDER — FLUCONAZOLE 150 MG PO TABS: 150.0000 mg | ORAL_TABLET | Freq: Once | ORAL | 0 refills | Status: AC

## 2021-09-05 MED ORDER — ROSUVASTATIN CALCIUM 10 MG PO TABS: 10.0000 mg | ORAL_TABLET | Freq: Every day | ORAL | 0 refills | Status: DC

## 2021-09-05 MED ORDER — PHENAZOPYRIDINE HCL 200 MG PO TABS
200.0000 mg | ORAL_TABLET | Freq: Three times a day (TID) | ORAL | 0 refills | Status: AC
Start: 2021-09-05 — End: 2021-09-07

## 2021-09-05 MED ORDER — NITROFURANTOIN MONOHYD MACRO 100 MG PO CAPS: 100.0000 mg | ORAL_CAPSULE | Freq: Two times a day (BID) | ORAL | 0 refills | Status: DC

## 2021-09-05 NOTE — Progress Notes (Signed)
No longer having hip/leg issues  Now thinks she has a UTI Started Tuesday AM  Bladder spasms and frequency Drinking water  Cranberry Juice No AZO

## 2021-09-05 NOTE — Progress Notes (Signed)
..Virtual Visit via Video Note  I connected with [name] on 09/05/21 at  1:00 PM EST by a video enabled telemedicine application and verified that I am speaking with the correct person using two identifiers.  Location: Patient: home Provider: clinic  .Marland KitchenParticipating in visit:  Patient: [name] Provider: Iran Planas PA-C Provider in training: Tanya Sheen PA-S   I discussed the limitations of evaluation and management by telemedicine and the availability of in person appointments. The patient expressed understanding and agreed to proceed.  History of Present Illness: Pt is a 66 yo obese female who wants to discuss dysuria, frequent urination, low abdominal pressure since Tuesday, 4 days. She took AZO and drinking cranberry juice and got a little better then got worse. No fever, chills, flank pain. No nausea or vomiting. She recently had sex for the first time in a while and started having symptoms after.   .. Active Ambulatory Problems    Diagnosis Date Noted   UTI (urinary tract infection) 07/14/2012   Other and unspecified hyperlipidemia 07/14/2012   MVP (mitral valve prolapse) 07/14/2012   GERD (gastroesophageal reflux disease) 07/14/2012   Allergic rhinitis 07/14/2012   Mitral regurgitation 07/14/2012   S/P hysterectomy 07/14/2012   Essential hypertension, benign 04/29/2013   Wheezing 04/29/2013   Insomnia 04/29/2013   Unspecified vitamin D deficiency 04/29/2013   Migraine headache 04/29/2013   Anxiety state 04/29/2013   Unspecified hypothyroidism 04/29/2013   Obesity (BMI 30-39.9) 07/17/2013   Cough 08/15/2013   Arthritis of left knee 09/14/2013   Status post total knee replacement 09/14/2013   Abdominal pain, chronic, right upper quadrant 11/16/2013   Candidiasis of female genitalia 11/16/2013   Elevated liver enzymes 96/78/9381   Folliculitis 01/75/1025   Nausea alone 11/18/2013   Abnormal transaminases 11/18/2013   Hyperlipidemia 11/22/2014   Single  kidney 11/22/2014   Hypothyroidism 11/22/2014   OSA (obstructive sleep apnea) 12/23/2015   Daytime sleepiness 01/24/2017   Carpal tunnel syndrome, left upper limb 02/25/2017   Status post carpal tunnel release 03/11/2017   Hypertriglyceridemia 07/14/2017   Elevated fasting glucose 07/14/2017   Chronic pain of right knee 09/13/2017   Unilateral primary osteoarthritis, right knee 09/27/2017   Prediabetes 10/19/2017   Pain in thumb joint with movement of right hand 10/19/2017   Class 2 obesity due to excess calories without serious comorbidity with body mass index (BMI) of 35.0 to 35.9 in adult 10/19/2017   Other meniscus derangements, posterior horn of medial meniscus, right knee 11/24/2017   Pain of right thumb 11/24/2017   Trigger thumb, right thumb 11/24/2017   BPPV (benign paroxysmal positional vertigo), right 02/20/2018   Concussion wth loss of consciousness of 30 minutes or less 02/20/2018   Fall 02/20/2018   CKD (chronic kidney disease) stage 3, GFR 30-59 ml/min (HCC) 03/10/2019   BPPV (benign paroxysmal positional vertigo), left 05/22/2019   Pain radiating to back 05/23/2019   Elevated lipase 06/12/2019   Seborrheic keratoses 01/05/2020   Papules 01/05/2020   Class 2 obesity due to excess calories without serious comorbidity with body mass index (BMI) of 37.0 to 37.9 in adult 01/05/2020   Anxiety and depression 06/03/2014   Fracture of triquetral bone of wrist 02/21/2018   Primary osteoarthritis involving multiple joints 06/03/2014   Degenerative disc disease, cervical 04/11/2020   Right arm pain 04/16/2020   Right arm weakness 04/16/2020   Iron deficiency 04/17/2020   Airway compromise 05/28/2020   Laryngospasm 05/28/2020   Status post cervical spinal fusion 06/04/2020  SOB (shortness of breath) 06/04/2020   History of laryngeal spasm 06/04/2020   Atelectasis 06/04/2020   Frequent headaches 10/14/2020   Epidermal cyst 03/07/2021   Abnormal kidney function 03/10/2021    Iron deficiency anemia 03/10/2021   Resolved Ambulatory Problems    Diagnosis Date Noted   Hypothyroidism 07/14/2012   Past Medical History:  Diagnosis Date   Acute medial meniscal tear    Allergy    Anxiety    Aortic insufficiency    Aortic regurgitation    Arthritis    Carpal tunnel syndrome    Chronic kidney disease    Dysrhythmia    Mitral valve prolapse    Need for SBE (subacute bacterial endocarditis) prophylaxis    Pilonidal cyst without mention of abscess    Pneumonia Aug 14 2012   PONV (postoperative nausea and vomiting)    Sleep apnea    Thyroid disease    Transplanted kidney removed        Observations/Objective: No acute distress Normal breathing Normal appearance  No vitals  Assessment and Plan: Marland KitchenMarland KitchenJahnavi was seen today for hip pain.  Diagnoses and all orders for this visit:  Dysuria -     nitrofurantoin, macrocrystal-monohydrate, (MACROBID) 100 MG capsule; Take 1 capsule (100 mg total) by mouth 2 (two) times daily. -     phenazopyridine (PYRIDIUM) 200 MG tablet; Take 1 tablet (200 mg total) by mouth 3 (three) times daily for 2 days.  Mixed hyperlipidemia -     rosuvastatin (CRESTOR) 10 MG tablet; Take 1 tablet (10 mg total) by mouth daily.  Hypertriglyceridemia -     rosuvastatin (CRESTOR) 10 MG tablet; Take 1 tablet (10 mg total) by mouth daily.  Urinary frequency -     nitrofurantoin, macrocrystal-monohydrate, (MACROBID) 100 MG capsule; Take 1 capsule (100 mg total) by mouth 2 (two) times daily. -     phenazopyridine (PYRIDIUM) 200 MG tablet; Take 1 tablet (200 mg total) by mouth 3 (three) times daily for 2 days.  Other orders -     fluconazole (DIFLUCAN) 150 MG tablet; Take 1 tablet (150 mg total) by mouth once for 1 dose. Repeat   Sounds like UTI. No red flags. If still symptomatic needs to give [place] urine sample on Monday. Treated with macrobid and pyridium.  Dilfucan for yeast after abx Continue to stay hydrated   Follow Up  Instructions:    I discussed the assessment and treatment plan with the patient. The patient was provided an opportunity to ask questions and all were answered. The patient agreed with the plan and demonstrated an understanding of the instructions.   The patient was advised to call back or seek an in-person evaluation if the symptoms worsen or if the condition fails to improve as anticipated.    Iran Planas, PA-C

## 2021-10-05 ENCOUNTER — Other Ambulatory Visit: Payer: Self-pay | Admitting: Physician Assistant

## 2021-10-05 DIAGNOSIS — F411 Generalized anxiety disorder: Secondary | ICD-10-CM

## 2021-10-06 ENCOUNTER — Other Ambulatory Visit: Payer: Self-pay | Admitting: Physician Assistant

## 2021-10-06 DIAGNOSIS — I10 Essential (primary) hypertension: Secondary | ICD-10-CM

## 2021-10-10 ENCOUNTER — Other Ambulatory Visit: Payer: Self-pay | Admitting: Physician Assistant

## 2021-10-10 DIAGNOSIS — K2100 Gastro-esophageal reflux disease with esophagitis, without bleeding: Secondary | ICD-10-CM

## 2021-10-10 DIAGNOSIS — I10 Essential (primary) hypertension: Secondary | ICD-10-CM

## 2021-10-10 DIAGNOSIS — E781 Pure hyperglyceridemia: Secondary | ICD-10-CM

## 2021-10-10 DIAGNOSIS — E039 Hypothyroidism, unspecified: Secondary | ICD-10-CM

## 2021-10-10 DIAGNOSIS — E782 Mixed hyperlipidemia: Secondary | ICD-10-CM

## 2021-11-13 ENCOUNTER — Encounter: Payer: Self-pay | Admitting: Cardiology

## 2021-11-13 ENCOUNTER — Ambulatory Visit (INDEPENDENT_AMBULATORY_CARE_PROVIDER_SITE_OTHER): Payer: Medicare Other | Admitting: Physician Assistant

## 2021-11-13 ENCOUNTER — Encounter: Payer: Self-pay | Admitting: Physician Assistant

## 2021-11-13 VITALS — BP 137/77 | HR 61 | Resp 18 | Ht 64.5 in | Wt 224.0 lb

## 2021-11-13 DIAGNOSIS — J4541 Moderate persistent asthma with (acute) exacerbation: Secondary | ICD-10-CM

## 2021-11-13 DIAGNOSIS — E782 Mixed hyperlipidemia: Secondary | ICD-10-CM

## 2021-11-13 DIAGNOSIS — J45909 Unspecified asthma, uncomplicated: Secondary | ICD-10-CM | POA: Insufficient documentation

## 2021-11-13 DIAGNOSIS — D509 Iron deficiency anemia, unspecified: Secondary | ICD-10-CM

## 2021-11-13 DIAGNOSIS — R7303 Prediabetes: Secondary | ICD-10-CM

## 2021-11-13 DIAGNOSIS — I10 Essential (primary) hypertension: Secondary | ICD-10-CM

## 2021-11-13 DIAGNOSIS — E781 Pure hyperglyceridemia: Secondary | ICD-10-CM | POA: Diagnosis not present

## 2021-11-13 DIAGNOSIS — E039 Hypothyroidism, unspecified: Secondary | ICD-10-CM | POA: Diagnosis not present

## 2021-11-13 MED ORDER — AZITHROMYCIN 250 MG PO TABS: ORAL_TABLET | ORAL | 0 refills | Status: DC

## 2021-11-13 MED ORDER — PREDNISONE 20 MG PO TABS: ORAL_TABLET | ORAL | 0 refills | Status: DC

## 2021-11-13 MED ORDER — MONTELUKAST SODIUM 10 MG PO TABS: 10.0000 mg | ORAL_TABLET | Freq: Every day | ORAL | 3 refills | Status: DC

## 2021-11-13 NOTE — Progress Notes (Signed)
? ?Subjective:  ? ? Patient ID: [name], female    DOB: [birth year], 66 y.o.   MRN: [id number] ? ?HPI ?Pt is a 66 yo obese female with HTN, seasonal allergies, OSA, hypothyroidism, HLD, CKD, [name], pre diabetes who presents to the clinic with cough, SOB, sinus pressure for 2 weeks. Breathing is worse when she is outside. Cough is more dry but at times she can cough stuff up. She is using albuterol and zyrtec which both help some. She has been on zyrtec for a long time. No fever, chills, body aches. She has some ongoing sinus pressure and congestion as well. She is blowing out green/yellow sputum.  ? ?.. ?Active Ambulatory Problems  ?  Diagnosis Date Noted  ? UTI (urinary tract infection) 07/14/2012  ? Other and unspecified hyperlipidemia 07/14/2012  ? MVP (mitral valve prolapse) 07/14/2012  ? GERD (gastroesophageal reflux disease) 07/14/2012  ? Allergic rhinitis 07/14/2012  ? Mitral regurgitation 07/14/2012  ? S/P hysterectomy 07/14/2012  ? Essential hypertension, benign 04/29/2013  ? Wheezing 04/29/2013  ? Insomnia 04/29/2013  ? Unspecified vitamin D deficiency 04/29/2013  ? Migraine headache 04/29/2013  ? Anxiety state 04/29/2013  ? Unspecified hypothyroidism 04/29/2013  ? Obesity (BMI 30-39.9) 07/17/2013  ? Cough 08/15/2013  ? Arthritis of left knee 09/14/2013  ? Status post total knee replacement 09/14/2013  ? Abdominal pain, chronic, right upper quadrant 11/16/2013  ? Candidiasis of female genitalia 11/16/2013  ? Elevated liver enzymes 11/16/2013  ? Folliculitis 55/73/2202  ? Nausea alone 11/18/2013  ? Abnormal transaminases 11/18/2013  ? Hyperlipidemia 11/22/2014  ? Single kidney 11/22/2014  ? Hypothyroidism 11/22/2014  ? OSA (obstructive sleep apnea) 12/23/2015  ? Daytime sleepiness 01/24/2017  ? Carpal tunnel syndrome, left upper limb 02/25/2017  ? Status post carpal tunnel release 03/11/2017  ? Hypertriglyceridemia 07/14/2017  ? Elevated fasting glucose 07/14/2017  ? Chronic pain of right knee  09/13/2017  ? Unilateral primary osteoarthritis, right knee 09/27/2017  ? Pre-diabetes 10/19/2017  ? Pain in thumb joint with movement of right hand 10/19/2017  ? Class 2 obesity due to excess calories without serious comorbidity with body mass index (BMI) of 35.0 to 35.9 in adult 10/19/2017  ? Other meniscus derangements, posterior horn of medial meniscus, right knee 11/24/2017  ? Pain of right thumb 11/24/2017  ? Trigger thumb, right thumb 11/24/2017  ? BPPV (benign paroxysmal positional vertigo), right 02/20/2018  ? Concussion wth loss of consciousness of 30 minutes or less 02/20/2018  ? Fall 02/20/2018  ? CKD (chronic kidney disease) stage 3, GFR 30-59 ml/min (HCC) 03/10/2019  ? BPPV (benign paroxysmal positional vertigo), left 05/22/2019  ? Pain radiating to back 05/23/2019  ? Elevated lipase 06/12/2019  ? Seborrheic keratoses 01/05/2020  ? Papules 01/05/2020  ? Class 2 obesity due to excess calories without serious comorbidity with body mass index (BMI) of 37.0 to 37.9 in adult 01/05/2020  ? Anxiety and depression 06/03/2014  ? Fracture of triquetral bone of wrist 02/21/2018  ? Primary osteoarthritis involving multiple joints 06/03/2014  ? Degenerative disc disease, cervical 04/11/2020  ? Right arm pain 04/16/2020  ? Right arm weakness 04/16/2020  ? Iron deficiency 04/17/2020  ? Airway compromise 05/28/2020  ? Laryngospasm 05/28/2020  ? Status post cervical spinal fusion 06/04/2020  ? SOB (shortness of breath) 06/04/2020  ? History of laryngeal spasm 06/04/2020  ? Atelectasis 06/04/2020  ? Frequent headaches 10/14/2020  ? Epidermal cyst 03/07/2021  ? Abnormal kidney function 03/10/2021  ? Iron deficiency anemia 03/10/2021  ?  Asthmatic bronchitis 11/13/2021  ? ?Resolved Ambulatory Problems  ?  Diagnosis Date Noted  ? Hypothyroidism 07/14/2012  ? ?Past Medical History:  ?Diagnosis Date  ? Acute medial meniscal tear   ? Allergy   ? Anxiety   ? Aortic insufficiency   ? Aortic regurgitation   ? Arthritis   ? Carpal  tunnel syndrome   ? Chronic kidney disease   ? Dysrhythmia   ? Mitral valve prolapse   ? Need for SBE (subacute bacterial endocarditis) prophylaxis   ? Pilonidal cyst without mention of abscess   ? Pneumonia Aug 14 2012  ? PONV (postoperative nausea and vomiting)   ? Sleep apnea   ? Thyroid disease   ? Transplanted kidney removed   ? ? ? ?Review of Systems ?See HPI.  ?   ?Objective:  ? Physical Exam ?Vitals reviewed.  ?Constitutional:   ?   Appearance: She is well-developed. She is obese.  ?HENT:  ?   Head: Normocephalic.  ?   Mouth/Throat:  ?   Mouth: Mucous membranes are moist.  ?   Pharynx: No pharyngeal swelling or oropharyngeal exudate.  ?Eyes:  ?   Extraocular Movements: Extraocular movements intact.  ?   Pupils: Pupils are equal, round, and reactive to light.  ?   Comments: Injected bilateral conjunctiva with watery discharge.   ?Neck:  ?   Vascular: No JVD.  ?Cardiovascular:  ?   Rate and Rhythm: Normal rate and regular rhythm.  ?   Heart sounds: No murmur heard. ?Pulmonary:  ?   Effort: Pulmonary effort is normal.  ?   Breath sounds: Normal breath sounds. No decreased breath sounds, wheezing, rhonchi or rales.  ?Chest:  ?   Chest wall: No mass, tenderness or edema. There is no dullness to percussion.  ?Musculoskeletal:  ?   Cervical back: Normal range of motion.  ?Lymphadenopathy:  ?   Cervical: No cervical adenopathy.  ?Neurological:  ?   General: No focal deficit present.  ?   Mental Status: She is alert and oriented to person, place, and time.  ?Psychiatric:     ?   Mood and Affect: Mood normal.     ?   Behavior: Behavior normal.  ? ? ? ? ? ? ?   ?Assessment & Plan:  ?..[place] was seen today for shortness of breath. ? ?Diagnoses and all orders for this visit: ? ?Moderate persistent asthmatic bronchitis with acute exacerbation ?-     montelukast (SINGULAIR) 10 MG tablet; Take 1 tablet (10 mg total) by mouth at bedtime. ?-     azithromycin (ZITHROMAX Z-PAK) 250 MG tablet; Take 2 tablets (500 mg) on  Day 1,   followed by 1 tablet (250 mg) once daily on Days 2 through 5. ?-     predniSONE (DELTASONE) 20 MG tablet; Take 3 tablets for 3 days, take 2 tablets for 3 days, take 1 tablet for 3 days, take 1/2 tablet for 4 days. ? ?Hypertriglyceridemia ?-     Lipid Panel w/reflex Direct LDL ? ?Mixed hyperlipidemia ?-     Lipid Panel w/reflex Direct LDL ? ?Hypothyroidism, unspecified type ?-     TSH ? ?Iron deficiency anemia, unspecified iron deficiency anemia type ?-     CBC with Differential/Platelet ? ?Essential hypertension, benign ?-     COMPLETE METABOLIC PANEL WITH GFR ? ?Pre-diabetes ?-     Hemoglobin A1c ? ? ?BP and HR good. Pulse Ox is 94 percent. Suspect asthmatic bronchitis.  ?Start zpak,  prednisone, albuterol as needed and singular at bedtime. Continue allegra/zyrtec/claritin or switch to what you have been taking.  ? ?Needs fasting labs. Ordered today to follow up on pre diabetes, HTN, hypothyroidism, hyperlipidemia, [name].  ?

## 2021-11-14 ENCOUNTER — Telehealth: Payer: Self-pay | Admitting: *Deleted

## 2021-11-14 ENCOUNTER — Encounter: Payer: Self-pay | Admitting: Physician Assistant

## 2021-11-14 ENCOUNTER — Encounter: Payer: Self-pay | Admitting: Cardiology

## 2021-11-14 NOTE — Telephone Encounter (Signed)
Pt sent MyChart message requesting an appt with Dr Marlou Porch d/t increased palpitations.  Pt reports she has had this for quite awhile but recently has been increasing in frequency.  Schedule appt for pt with Derl Barrow 5/2 at 11:40 am.   She will call back prior to then if s/s increase/worsen.   Pt recently saw PCP and had lab work.  She has reviewed those results and will f/u with that office spefically regarding her Hemoglobin of 19.1.   ? ?

## 2021-11-16 NOTE — Progress Notes (Signed)
Thyroid looks good.  ?Hemoglobin is way to high. Need ferritin and serum iron added.  ?Are you taking any iron?  ?Are you taking any new medications?  ? ?A1C stable. Continues to be in pre-diabetes range.  ?Kidney function declined a bit more.  ?LDL to goal.  ?TG are elevated.  ? ?

## 2021-11-17 ENCOUNTER — Encounter: Payer: Self-pay | Admitting: Physician Assistant

## 2021-11-17 MED ORDER — ALBUTEROL SULFATE HFA 108 (90 BASE) MCG/ACT IN AERS: 2.0000 | INHALATION_SPRAY | Freq: Four times a day (QID) | RESPIRATORY_TRACT | 5 refills | Status: DC | PRN

## 2021-11-19 ENCOUNTER — Encounter: Payer: Self-pay | Admitting: Physician Assistant

## 2021-11-19 ENCOUNTER — Other Ambulatory Visit: Payer: Self-pay | Admitting: Physician Assistant

## 2021-11-19 DIAGNOSIS — R0902 Hypoxemia: Secondary | ICD-10-CM

## 2021-11-19 DIAGNOSIS — R0602 Shortness of breath: Secondary | ICD-10-CM

## 2021-11-19 DIAGNOSIS — D582 Other hemoglobinopathies: Secondary | ICD-10-CM

## 2021-11-19 LAB — CBC WITH DIFFERENTIAL/PLATELET
Absolute Monocytes: 554 cells/uL (ref 200–950)
Basophils Absolute: 88 cells/uL (ref 0–200)
Basophils Relative: 1 %
Eosinophils Absolute: 378 cells/uL (ref 15–500)
Eosinophils Relative: 4.3 %
HCT: 61 % — ABNORMAL HIGH (ref 35.0–45.0)
Hemoglobin: 19.1 g/dL — ABNORMAL HIGH (ref 11.7–15.5)
Lymphs Abs: 1364 cells/uL (ref 850–3900)
MCH: 25.8 pg — ABNORMAL LOW (ref 27.0–33.0)
MCHC: 31.3 g/dL — ABNORMAL LOW (ref 32.0–36.0)
MCV: 82.3 fL (ref 80.0–100.0)
MPV: 10 fL (ref 7.5–12.5)
Monocytes Relative: 6.3 %
Neutro Abs: 6415 cells/uL (ref 1500–7800)
Neutrophils Relative %: 72.9 %
Platelets: 377 10*3/uL (ref 140–400)
RBC: 7.41 10*6/uL — ABNORMAL HIGH (ref 3.80–5.10)
RDW: 18.2 % — ABNORMAL HIGH (ref 11.0–15.0)
Total Lymphocyte: 15.5 %
WBC: 8.8 10*3/uL (ref 3.8–10.8)

## 2021-11-19 LAB — HEMOGLOBIN A1C
Hgb A1c MFr Bld: 5.9 % of total Hgb — ABNORMAL HIGH (ref <5.7)
Mean Plasma Glucose: 123 mg/dL
eAG (mmol/L): 6.8 mmol/L

## 2021-11-19 LAB — COMPLETE METABOLIC PANEL WITH GFR
AG Ratio: 1.8 (calc) (ref 1.0–2.5)
ALT: 36 U/L — ABNORMAL HIGH (ref 6–29)
AST: 29 U/L (ref 10–35)
Albumin: 4.6 g/dL (ref 3.6–5.1)
Alkaline phosphatase (APISO): 87 U/L (ref 37–153)
BUN/Creatinine Ratio: 13 (calc) (ref 6–22)
BUN: 17 mg/dL (ref 7–25)
CO2: 26 mmol/L (ref 20–32)
Calcium: 10.4 mg/dL (ref 8.6–10.4)
Chloride: 105 mmol/L (ref 98–110)
Creat: 1.31 mg/dL — ABNORMAL HIGH (ref 0.50–1.05)
Globulin: 2.6 g/dL (calc) (ref 1.9–3.7)
Glucose, Bld: 75 mg/dL (ref 65–99)
Potassium: 4.7 mmol/L (ref 3.5–5.3)
Sodium: 143 mmol/L (ref 135–146)
Total Bilirubin: 1.3 mg/dL — ABNORMAL HIGH (ref 0.2–1.2)
Total Protein: 7.2 g/dL (ref 6.1–8.1)
eGFR: 45 mL/min/{1.73_m2} — ABNORMAL LOW (ref >=60)

## 2021-11-19 LAB — LIPID PANEL W/REFLEX DIRECT LDL
Cholesterol: 168 mg/dL (ref <200)
HDL: 45 mg/dL — ABNORMAL LOW (ref >=50)
LDL Cholesterol (Calc): 91 mg/dL (calc)
Non-HDL Cholesterol (Calc): 123 mg/dL (calc) (ref <130)
Total CHOL/HDL Ratio: 3.7 (calc) (ref <5.0)
Triglycerides: 230 mg/dL — ABNORMAL HIGH (ref <150)

## 2021-11-19 LAB — IRON,TIBC AND FERRITIN PANEL
%SAT: 13 % (calc) — ABNORMAL LOW (ref 16–45)
Ferritin: 18 ng/mL (ref 16–288)
Iron: 66 ug/dL (ref 45–160)
TIBC: 503 mcg/dL (calc) — ABNORMAL HIGH (ref 250–450)

## 2021-11-19 LAB — TSH: TSH: 2.52 mIU/L (ref 0.40–4.50)

## 2021-11-19 NOTE — Progress Notes (Signed)
Your iron is not the problem. I want to get a CT of lungs to look for lung disease or anything effecting your hemoglobin. How is your breathing?

## 2021-11-21 ENCOUNTER — Ambulatory Visit (INDEPENDENT_AMBULATORY_CARE_PROVIDER_SITE_OTHER): Payer: Medicare Other

## 2021-11-21 DIAGNOSIS — R0602 Shortness of breath: Secondary | ICD-10-CM | POA: Diagnosis not present

## 2021-11-21 DIAGNOSIS — D582 Other hemoglobinopathies: Secondary | ICD-10-CM | POA: Diagnosis not present

## 2021-11-21 DIAGNOSIS — R0609 Other forms of dyspnea: Secondary | ICD-10-CM | POA: Diagnosis not present

## 2021-11-21 DIAGNOSIS — R0902 Hypoxemia: Secondary | ICD-10-CM | POA: Diagnosis not present

## 2021-11-24 NOTE — Progress Notes (Signed)
GREAT news. No abnormality of chest. If no changes in hemoglobin at next recheck will send to hematology for further evaluation. Low levels of O2 in blood can cause hemoglobin to increase like that and I was wondering if your recent shortness of breath could have contributed.

## 2021-12-08 ENCOUNTER — Encounter: Payer: Self-pay | Admitting: Physician Assistant

## 2021-12-08 NOTE — Progress Notes (Signed)
?Cardiology Office Note:   ? ?Date:  12/09/2021  ? ?ID:  [name], DOB 01-31-1956, MRN [id number] ? ?PCP:  Jerline Pain, MD  ?[facility] Cardiologist:  Candee Furbish, MD  ?[facility] Electrophysiologist:  None  ? ?Referring MD: Donella Stade, PA-C  ? ? ?History of Present Illness:   ? ?[name] is a 66 y.o. female here for follow-up of increased palpitations. ? ?She messaged our office 11/13/2021 and reported having frequent PVC's, recently increasing in frequency. She had recently seen her PCP and had lab work. She will follow-up with her PCP regarding her Hemoglobin of 19.1. ? ?Previously here for follow-up of mitral regurgitation.  She has had a murmur for several years.  Had PVCs on monitor previously which correlated with palpitations.   ? ?Has been on Crestor for hyperlipidemia. ? ?Has had vertigo at times; suffered a fall with fracture at 1 point. ? ?Today: ?She presents EKG tracings taken via her smart watch, which upon personal review do show PVCs. Typically she is very aware of her palpitations, which have been increasing. ? ?She has cut out caffeine in her diet significantly. She is not a smoker. ? ?She remains compliant with Crestor, and reports her triglycerides were high. Our records show labs on 11/13/21 revealed triglycerides 230. ? ?She denies any chest pain, shortness of breath, or peripheral edema. No lightheadedness, headaches, syncope, orthopnea, or PND. ? 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 ? ?Past Medical History:  ?Diagnosis Date  ? Acute medial meniscal tear   ? Right knee  ? Allergy   ? Anxiety   ? Aortic insufficiency   ? Mild  ? Aortic regurgitation   ? Arthritis   ? Carpal tunnel syndrome   ? Chronic kidney disease   ? one kidney   ? Dysrhythmia   ? pvc's at times  ? GERD (gastroesophageal reflux disease)   ? Hyperlipidemia   ? Hypothyroidism   ? Migraine headache   ? Mitral regurgitation   ? Mitral valve prolapse   ? Nausea  alone   ? Need for SBE (subacute bacterial endocarditis) prophylaxis   ? Pilonidal cyst without mention of abscess   ? Pneumonia Aug 14 2012  ? PONV (postoperative nausea and vomiting)   ? states she vasovagals  ? Sleep apnea   ? wears CPAP  ? Thyroid disease   ? Transplanted kidney removed   ? donated kidney  ? ? ?Past Surgical History:  ?Procedure Laterality Date  ? ABDOMINAL HYSTERECTOMY    ? ANTERIOR CERVICAL DECOMP/DISCECTOMY FUSION  05/28/2020  ? CARPAL TUNNEL RELEASE Right   ? CARPAL TUNNEL RELEASE Left 02/25/2017  ? Procedure: LEFT OPEN CARPAL TUNNEL RELEASE;  Surgeon: [name], MD;  Location: [place];  Service: Orthopedics;  Laterality: Left;  ? CARPAL TUNNEL RELEASE Left 1998  ? CARPAL TUNNEL RELEASE Right 2019  ? COLONOSCOPY    ? KNEE ARTHROSCOPY Right 01/06/2018  ? Procedure: RIGHT KNEE ARTHROSCOPY WITH PARTIAL  MEDIAL MENISCECTOMY;  Surgeon: [name], MD;  Location: [place];  Service: Orthopedics;  Laterality: Right;  ? KNEE SURGERY    ? LARYNGOSCOPY N/A 05/30/2020  ? Procedure: LARYNGOSCOPY;  Surgeon: Leta Baptist, MD;  Location: [facility] OR;  Service: ENT;  Laterality: N/A;  ? NEPHRECTOMY    ? PARTIAL KNEE ARTHROPLASTY Right 03/31/2018  ? Procedure: RIGHT KNEE UNI ARTHROPLASTY;  Surgeon: Marchia Bond, MD;  Location: [place];  Service: Orthopedics;  Laterality: Right;  ? TONSILLECTOMY    ? TOTAL KNEE ARTHROPLASTY Left 09/14/2013  ? Procedure: LEFT TOTAL KNEE ARTHROPLASTY;  Surgeon: [name], MD;  Location: [place];  Service: Orthopedics;  Laterality: Left;  ? TUBAL LIGATION    ? WISDOM TOOTH EXTRACTION    ? WOUND EXPLORATION N/A 05/28/2020  ? Procedure: ANTERIOR CERVICAL WOUND EXPLORATION;  Surgeon: Erline Levine, MD;  Location: [place];  Service: Neurosurgery;  Laterality: N/A;  ? ? ?Current Medications: ?Current Meds  ?Medication Sig  ? acetaminophen (TYLENOL) 325 MG tablet Take 325-650 mg by mouth every 6 (six) hours as needed for mild pain or headache.  ?  albuterol (VENTOLIN HFA) 108 (90 Base) MCG/ACT inhaler Inhale 2 puffs into the lungs every 6 (six) hours as needed for wheezing or shortness of breath.  ? amoxicillin (AMOXIL) 500 MG capsule Take 4 capsules by mouth 1 hour before dental treatment  ? calcium carbonate (TUMS - DOSED IN MG ELEMENTAL CALCIUM) 500 MG chewable tablet Chew 1 tablet by mouth as needed for indigestion or heartburn.  ? Carboxymethylcellul-Glycerin (LUBRICATING EYE DROPS OP) Place 1 drop into both eyes as needed (dry eyes).   ? Cholecalciferol (VITAMIN D3) 3000 units TABS Take 1,000 Units by mouth daily.   ? citalopram (CELEXA) 20 MG tablet TAKE 1/2 TABLET ONCE A DAY  ? Cyanocobalamin (B-12 PO) Take 1 tablet by mouth daily.   ? hydrochlorothiazide (MICROZIDE) 12.5 MG capsule Take 1 capsule (12.5 mg total) by mouth daily. NEEDS LABS  ? levothyroxine (SYNTHROID) 125 MCG tablet Take 1 tablet (125 mcg total) by mouth daily before breakfast. NEEDS LABS  ? lisinopril (ZESTRIL) 2.5 MG tablet TAKE 1 TABLET EVERY DAY  ? montelukast (SINGULAIR) 10 MG tablet Take 1 tablet (10 mg total) by mouth at bedtime.  ? pantoprazole (PROTONIX) 40 MG tablet TAKE 1 TABLET EVERY DAY  ? promethazine (PHENERGAN) 25 MG tablet Take 1 tablet by mouth as needed.  ? rosuvastatin (CRESTOR) 10 MG tablet Take 1 tablet (10 mg total) by mouth daily. NEEDS LABS  ?  ? ?Allergies:   Percocet [oxycodone-acetaminophen], Shrimp [shellfish allergy], Hydrocodone-acetaminophen, Lipitor [atorvastatin], Saxenda [liraglutide -weight management], Ferrous sulfate, Adhesive [tape], Belviq xr [lorcaserin hcl er], and Morphine and related  ? ?Social History  ? ?Socioeconomic History  ? Marital status: Married  ?  Spouse name: [name]  ? Number of children: 7  ? Years of education: 28  ? Highest education level: Master's degree (e.g., MA, MS, MEng, MEd, MSW, MBA)  ?Occupational History  ?  Employer: [employer]  ?Tobacco Use  ? Smoking status: Former  ?  Types: Cigarettes  ?  Quit date: 11/22/1983  ?   Years since quitting: 38.0  ? Smokeless tobacco: Never  ?Vaping Use  ? Vaping Use: Never used  ?Substance and Sexual Activity  ? Alcohol use: Yes  ?  Comment: Rarely  ? Drug use: No  ? Sexual activity: Yes  ?  Partners: Male  ?Other Topics Concern  ? Not on file  ?Social History Narrative  ? Marital Status:  Married (Tom)Children: 7Pets:  Dogs (2)Living Situation: Lives with spouse, son and his family.  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[employer]  Education:  Forensic psychologist; MSN Tobacco Use/Exposure: NoneAlcohol Use: RarelyDrug Use:  NoneDiet:  Low Fat Exercise:  Limited (Walking Dogs) Hobbies:  Reading, Movies   ? ?Social Determinants of Health  ? ?Financial Resource Strain: Low Risk   ? Difficulty of Paying Living Expenses:  Not hard at all  ?Food Insecurity: No Food Insecurity  ? Worried About Charity fundraiser in the Last Year: Never true  ? Ran Out of Food in the Last Year: Never true  ?Transportation Needs: No Transportation Needs  ? Lack of Transportation (Medical): No  ? Lack of Transportation (Non-Medical): No  ?Physical Activity: Insufficiently Active  ? Days of Exercise per Week: 2 days  ? Minutes of Exercise per Session: 50 min  ?Stress: No Stress Concern Present  ? Feeling of Stress : Not at all  ?Social Connections: Socially Integrated  ? Frequency of Communication with Friends and Family: More than three times a week  ? Frequency of Social Gatherings with Friends and Family: Three times a week  ? Attends Religious Services: 1 to 4 times per year  ? Active Member of Clubs or Organizations: Yes  ? Attends Archivist Meetings: More than 4 times per year  ? Marital Status: Married  ?  ? ?Family History: ?The patient's family history includes AAA (abdominal aortic aneurysm) in her father; Cancer in her maternal aunt and maternal aunt; Diabetes in her mother and sister; Fibromyalgia in her sister; Heart disease in her father and son; Hypertension in her father and mother;  Kidney disease in her daughter; Obstructive Sleep Apnea in her sister; Thyroid disease in her father. There is no history of Colon cancer, Esophageal cancer, Rectal cancer, or Stomach cancer. ? ?ROS:   ?Please

## 2021-12-09 ENCOUNTER — Ambulatory Visit (INDEPENDENT_AMBULATORY_CARE_PROVIDER_SITE_OTHER): Payer: Medicare Other | Admitting: Cardiology

## 2021-12-09 ENCOUNTER — Other Ambulatory Visit: Payer: Self-pay

## 2021-12-09 ENCOUNTER — Encounter: Payer: Self-pay | Admitting: Cardiology

## 2021-12-09 ENCOUNTER — Ambulatory Visit (INDEPENDENT_AMBULATORY_CARE_PROVIDER_SITE_OTHER): Payer: Medicare Other

## 2021-12-09 DIAGNOSIS — R002 Palpitations: Secondary | ICD-10-CM | POA: Insufficient documentation

## 2021-12-09 DIAGNOSIS — D582 Other hemoglobinopathies: Secondary | ICD-10-CM

## 2021-12-09 DIAGNOSIS — E782 Mixed hyperlipidemia: Secondary | ICD-10-CM | POA: Diagnosis not present

## 2021-12-09 DIAGNOSIS — D751 Secondary polycythemia: Secondary | ICD-10-CM | POA: Insufficient documentation

## 2021-12-09 DIAGNOSIS — I10 Essential (primary) hypertension: Secondary | ICD-10-CM

## 2021-12-09 NOTE — Assessment & Plan Note (Signed)
Currently on Crestor 10 mg a day.  Prior LDL 99.  ALT 43 at outside labs. ?

## 2021-12-09 NOTE — Assessment & Plan Note (Signed)
Recent hemoglobin increased.  Monitored by her primary physician.  Hemoglobin was 19.1 on 11/13/2021.  This is going to be repeated.  New finding for her.  Non-smoker.  No family history of hematologic disorders. ? ? ?

## 2021-12-09 NOTE — Progress Notes (Signed)
Cbc and BMP ordered per provider's request.  ?

## 2021-12-09 NOTE — Patient Instructions (Signed)
Medication Instructions:  ?The current medical regimen is effective;  continue present plan and medications. ? ?*If you need a refill on your cardiac medications before your next appointment, please call your pharmacy* ? ? ?Testing/Procedures: ?ZIO XT- Long Term Monitor Instructions ? ?Your physician has requested you wear a ZIO patch monitor for 14 days.  ?This is a single patch monitor. [employer] supplies one patch monitor per enrollment. Additional ?stickers are not available. Please do not apply patch if you will be having a Nuclear Stress Test,  ?Echocardiogram, Cardiac CT, MRI, or Chest Xray during the period you would be wearing the  ?monitor. The patch cannot be worn during these tests. You cannot remove and re-apply the  ?ZIO XT patch monitor.  ?Your ZIO patch monitor will be mailed 3 day USPS to your address on file. It may take 3-5 days  ?to receive your monitor after you have been enrolled.  ?Once you have received your monitor, please review the enclosed instructions. Your monitor  ?has already been registered assigning a specific monitor serial # to you. ? ?Billing and Patient Assistance Program Information ? ?We have supplied [employer] with any of your insurance information on file for billing purposes. ?[employer] offers a sliding scale Patient Assistance Program for patients that do not have  ?insurance, or whose insurance does not completely cover the cost of the ZIO monitor.  ?You must apply for the Patient Assistance Program to qualify for this discounted rate.  ?To apply, please call [employer] at [phone], select option 4, select option 2, ask to apply for  ?Patient Assistance Program. Theodore Demark will ask your household income, and how many people  ?are in your household. They will quote your out-of-pocket cost based on that information.  ?[employer] will also be able to set up a 6-month interest-free payment plan if needed. ? ?Applying the monitor ?  ?Shave hair from upper left chest.  ?Hold abrader  disc by orange tab. Rub abrader in 40 strokes over the upper left chest as  ?indicated in your monitor instructions.  ?Clean area with 4 enclosed alcohol pads. Let dry.  ?Apply patch as indicated in monitor instructions. Patch will be placed under collarbone on left  ?side of chest with arrow pointing upward.  ?Rub patch adhesive wings for 2 minutes. Remove white label marked "1". Remove the white  ?label marked "2". Rub patch adhesive wings for 2 additional minutes.  ?While looking in a mirror, press and release button in center of patch. A small green light will  ?flash 3-4 times. This will be your only indicator that the monitor has been turned on.  ?Do not shower for the first 24 hours. You may shower after the first 24 hours.  ?Press the button if you feel a symptom. You will hear a small click. Record Date, Time and  ?Symptom in the Patient Logbook.  ?When you are ready to remove the patch, follow instructions on the last 2 pages of Patient  ?Logbook. Stick patch monitor onto the last page of Patient Logbook.  ?Place Patient Logbook in the blue and white box. Use locking tab on box and tape box closed  ?securely. The blue and white box has prepaid postage on it. Please place it in the mailbox as  ?soon as possible. Your physician should have your test results approximately 7 days after the  [facility]  ?Call [facility]at [phone] you have questions regarding  ?your ZIO XT  patch monitor. Call them immediately if you see an orange light blinking on your  ?monitor.  ?If your monitor falls off in less than 4 days, contact our Monitor department at [phone].  ?If your monitor becomes loose or falls off after 4 days call [employer] at [phone] for  ?suggestions on securing your monitor ? ? ?Follow-Up: ?At [facility], you and your health needs are our priority.  As part of our continuing mission to provide you with exceptional heart care, we  have created designated Provider Care Teams.  These Care Teams include your primary Cardiologist (physician) and Advanced Practice Providers (APPs -  Physician Assistants and Nurse Practitioners) who all work together to provide you with the care you need, when you need it. ? ?We recommend signing up for the patient portal called "MyChart".  Sign up information is provided on this After Visit Summary.  MyChart is used to connect with patients for Virtual Visits (Telemedicine).  Patients are able to view lab/test results, encounter notes, upcoming appointments, etc.  Non-urgent messages can be sent to your provider as well.   ?To learn more about what you can do with MyChart, go to [email or web address].   ? ?Your next appointment:   ?1 year(s) ? ?The format for your next appointment:   ?In Person ? ?Provider:   ?Candee Furbish, MD { ? ? ?Important Information About Sugar ? ? ? ? ?  ?

## 2021-12-09 NOTE — Telephone Encounter (Signed)
Per provider's request - cbc and bmp ordered. Patient informed by provider via [place].  ?

## 2021-12-09 NOTE — Progress Notes (Unsigned)
Enrolled patient for a 14 day Zio XT  monitor to be mailed to patients home  °

## 2021-12-09 NOTE — Assessment & Plan Note (Addendum)
Has noticed increased PVCs, palpitations.  Many years ago she had a monitor that showed PVCs that correlated with palpitations.  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  - Lets check a Zio patch monitor 14-day. ?

## 2021-12-11 DIAGNOSIS — R002 Palpitations: Secondary | ICD-10-CM

## 2021-12-12 ENCOUNTER — Encounter: Payer: Self-pay | Admitting: Physician Assistant

## 2021-12-12 DIAGNOSIS — D582 Other hemoglobinopathies: Secondary | ICD-10-CM

## 2021-12-12 LAB — CBC
HCT: 61.1 % — ABNORMAL HIGH (ref 35.0–45.0)
Hemoglobin: 19.1 g/dL — ABNORMAL HIGH (ref 11.7–15.5)
MCH: 25.5 pg — ABNORMAL LOW (ref 27.0–33.0)
MCHC: 31.3 g/dL — ABNORMAL LOW (ref 32.0–36.0)
MCV: 81.5 fL (ref 80.0–100.0)
MPV: 10.2 fL (ref 7.5–12.5)
Platelets: 394 10*3/uL (ref 140–400)
RBC: 7.5 10*6/uL — ABNORMAL HIGH (ref 3.80–5.10)
RDW: 17.9 % — ABNORMAL HIGH (ref 11.0–15.0)
WBC: 7.7 10*3/uL (ref 3.8–10.8)

## 2021-12-12 LAB — BASIC METABOLIC PANEL WITH GFR
BUN/Creatinine Ratio: 14 (calc) (ref 6–22)
BUN: 16 mg/dL (ref 7–25)
CO2: 26 mmol/L (ref 20–32)
Calcium: 10.3 mg/dL (ref 8.6–10.4)
Chloride: 106 mmol/L (ref 98–110)
Creat: 1.18 mg/dL — ABNORMAL HIGH (ref 0.50–1.05)
Glucose, Bld: 83 mg/dL (ref 65–99)
Potassium: 5.1 mmol/L (ref 3.5–5.3)
Sodium: 142 mmol/L (ref 135–146)
eGFR: 51 mL/min/{1.73_m2} — ABNORMAL LOW (ref >=60)

## 2021-12-12 NOTE — Progress Notes (Signed)
Kidney function is better!  ?WBC is great.  ?Hemoglobin is still elevated. I would like to send you to hematology for work up. Are you ok with this?

## 2021-12-16 ENCOUNTER — Other Ambulatory Visit: Payer: Self-pay | Admitting: Family

## 2021-12-16 DIAGNOSIS — D751 Secondary polycythemia: Secondary | ICD-10-CM

## 2021-12-17 ENCOUNTER — Inpatient Hospital Stay: Payer: Medicare Other | Attending: Family

## 2021-12-17 ENCOUNTER — Encounter: Payer: Self-pay | Admitting: Family

## 2021-12-17 ENCOUNTER — Inpatient Hospital Stay (HOSPITAL_BASED_OUTPATIENT_CLINIC_OR_DEPARTMENT_OTHER): Payer: Medicare Other | Admitting: Family

## 2021-12-17 VITALS — BP 160/63 | HR 70 | Temp 98.1°F | Resp 17 | Ht 64.5 in | Wt 223.8 lb

## 2021-12-17 DIAGNOSIS — D751 Secondary polycythemia: Secondary | ICD-10-CM | POA: Diagnosis present

## 2021-12-17 DIAGNOSIS — Z79899 Other long term (current) drug therapy: Secondary | ICD-10-CM | POA: Insufficient documentation

## 2021-12-17 DIAGNOSIS — Z87891 Personal history of nicotine dependence: Secondary | ICD-10-CM | POA: Insufficient documentation

## 2021-12-17 DIAGNOSIS — G4730 Sleep apnea, unspecified: Secondary | ICD-10-CM | POA: Insufficient documentation

## 2021-12-17 DIAGNOSIS — D75839 Thrombocytosis, unspecified: Secondary | ICD-10-CM

## 2021-12-17 LAB — CMP (CANCER CENTER ONLY)
ALT: 31 U/L (ref 0–44)
AST: 33 U/L (ref 15–41)
Albumin: 4.6 g/dL (ref 3.5–5.0)
Alkaline Phosphatase: 87 U/L (ref 38–126)
Anion gap: 8 (ref 5–15)
BUN: 16 mg/dL (ref 8–23)
CO2: 28 mmol/L (ref 22–32)
Calcium: 10.4 mg/dL — ABNORMAL HIGH (ref 8.9–10.3)
Chloride: 106 mmol/L (ref 98–111)
Creatinine: 1.22 mg/dL — ABNORMAL HIGH (ref 0.44–1.00)
GFR, Estimated: 49 mL/min — ABNORMAL LOW (ref >60)
Glucose, Bld: 83 mg/dL (ref 70–99)
Potassium: 4.3 mmol/L (ref 3.5–5.1)
Sodium: 142 mmol/L (ref 135–145)
Total Bilirubin: 1.3 mg/dL — ABNORMAL HIGH (ref 0.3–1.2)
Total Protein: 6.9 g/dL (ref 6.5–8.1)

## 2021-12-17 LAB — CBC WITH DIFFERENTIAL (CANCER CENTER ONLY)
Abs Immature Granulocytes: 0.17 10*3/uL — ABNORMAL HIGH (ref 0.00–0.07)
Basophils Absolute: 0.2 10*3/uL — ABNORMAL HIGH (ref 0.0–0.1)
Basophils Relative: 2 %
Eosinophils Absolute: 0.4 10*3/uL (ref 0.0–0.5)
Eosinophils Relative: 4 %
HCT: 64.3 % — ABNORMAL HIGH (ref 36.0–46.0)
Hemoglobin: 20.1 g/dL — ABNORMAL HIGH (ref 12.0–15.0)
Immature Granulocytes: 2 %
Lymphocytes Relative: 18 %
Lymphs Abs: 1.8 10*3/uL (ref 0.7–4.0)
MCH: 26.4 pg (ref 26.0–34.0)
MCHC: 31.3 g/dL (ref 30.0–36.0)
MCV: 84.6 fL (ref 80.0–100.0)
Monocytes Absolute: 0.8 10*3/uL (ref 0.1–1.0)
Monocytes Relative: 9 %
Neutro Abs: 6.6 10*3/uL (ref 1.7–7.7)
Neutrophils Relative %: 65 %
Platelet Count: 428 10*3/uL — ABNORMAL HIGH (ref 150–400)
RBC: 7.6 MIL/uL — ABNORMAL HIGH (ref 3.87–5.11)
RDW: 18.5 % — ABNORMAL HIGH (ref 11.5–15.5)
WBC Count: 9.9 10*3/uL (ref 4.0–10.5)
nRBC: 0 % (ref 0.0–0.2)

## 2021-12-17 LAB — RETICULOCYTES
Immature Retic Fract: 23.3 % — ABNORMAL HIGH (ref 2.3–15.9)
RBC.: 7.53 MIL/uL — ABNORMAL HIGH (ref 3.87–5.11)
Retic Count, Absolute: 198 10*3/uL — ABNORMAL HIGH (ref 19.0–186.0)
Retic Ct Pct: 2.6 % (ref 0.4–3.1)

## 2021-12-17 LAB — LACTATE DEHYDROGENASE: LDH: 342 U/L — ABNORMAL HIGH (ref 98–192)

## 2021-12-17 LAB — SAVE SMEAR(SSMR), FOR PROVIDER SLIDE REVIEW

## 2021-12-17 NOTE — Progress Notes (Signed)
Hematology/Oncology Consultation  ? ?Name: [name]      MRN: [id number]    Location: Room/bed info not found  Date: 12/17/2021 Time:3:27 PM ? ? ?REFERRING PHYSICIAN: Iran Planas, PA-C  ? ?REASON FOR CONSULT:  Elevated Hgb ?  ?DIAGNOSIS: Erythrocytosis, polycythemia work up pending.   ? 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  ?She states that after her last pregnancy she developed thrombocytopenia and was seen by hematology around 14 years ago. This resolved after about a year.  ?She was noted to have erythrocytosis a month ago with her PCP.  ?WBC count is 9.9, Hgb 20 , Hct 64% and platelets 428.  ?She is symptomatic with fatigue, SOB with exertion, palpitations with noted PVC's (she is wearing Holter monitor now) and complexion is quite [place].  ?She also noted occasional blurry vision and frequent headaches.  ?She does have history of sleep apnea and wears a CPAP every night.  ?She has 5 children and history of 1 miscarriage at 23 weeks.  ?She donated her left kidney to her daughter.  ?No personal history of thrombotic event, stroke or liver disease.  ?Her mother passed away with a pulmonary embolism.  ?No personal history of cancer. Her sister, maternal cousin and 2 maternal great aunts have history of breast cancer.  ?Mammogram last year was negative. She is due again in July.  ?History of benign colonic polyp removed in 2014. She states that she is due again for EGD and colonoscopy this year.  ?No history of diabetes. She had a thyroidectomy and is on synthroid.  ?No fever, chills, n/v, cough, rash, dizziness, SOB, chest pain or changes in bowel or bladder habits.  ?She has occasional nausea and right upper quadrant pain that comes and goes.  ?No swelling, numbness or tingling in her extremities at this time.  ?No falls or syncope to report.  ?No smoking or recreational drug use. ETOH socially.   ?Appetite is decreased but she does feel that she is hydrating well throughout the day.  ?She is a retired Midwife.  ? ?ROS: All other 10 point review of systems is negative.  ? ?PAST MEDICAL HISTORY:   ?Past Medical History:  ?Diagnosis Date  ? Acute medial meniscal tear   ? Right knee  ? Allergy   ? Anxiety   ? Aortic insufficiency   ? Mild  ? Aortic regurgitation   ? Arthritis   ? Carpal tunnel syndrome   ? Chronic kidney disease   ? one kidney   ? Dysrhythmia   ? pvc's at times  ? GERD (gastroesophageal reflux disease)   ? Hyperlipidemia   ? Hypothyroidism   ? Migraine headache   ? Mitral regurgitation   ? Mitral valve prolapse   ? Nausea alone   ? Need for SBE (subacute bacterial endocarditis) prophylaxis   ? Pilonidal cyst without mention of abscess   ? Pneumonia Aug 14 2012  ? PONV (postoperative nausea and vomiting)   ? states she vasovagals  ? Sleep apnea   ? wears CPAP  ? Thyroid disease   ? Transplanted kidney removed   ? donated kidney  ? ? ?ALLERGIES: ?Allergies  ?Allergen Reactions  ? Percocet [Oxycodone-Acetaminophen] Other (See Comments)  ?  SYNCOPE ?TACHYCARDIA  ? Shrimp [Shellfish Allergy] Anaphylaxis, Itching, Nausea And Vomiting and Swelling  ? Hydrocodone-Acetaminophen Nausea And Vomiting and Other (See Comments)  ?  Dizziness, also  ? Lipitor [Atorvastatin] Other (See Comments)  ?  Ear pain/fullness in ear "as if I were under water"  ? Saxenda [Liraglutide -Weight Management] Hives, Itching and Other (See Comments)  ?  Hives/itching/site reaction  ? Ferrous Sulfate   ?  nausea  ? Adhesive [Tape] Rash and Other (See Comments)  ?  EKG leads and certain bandages break out the skin  ? Belviq Xr [Lorcaserin Hcl Er] Other (See Comments)  ?  Gained weight   ? Morphine And Related Nausea And Vomiting  ? ?   ?MEDICATIONS:  ?Current Outpatient Medications on File Prior to Visit  ?Medication Sig Dispense Refill  ? acetaminophen (TYLENOL) 325 MG tablet Take 325-650 mg by mouth every 6 (six) hours as  needed for mild pain or headache.    ? albuterol (VENTOLIN HFA) 108 (90 Base) MCG/ACT inhaler Inhale 2 puffs into the lungs every 6 (six) hours as needed for wheezing or shortness of breath. 1 each 5  ? amoxicillin (AMOXIL) 500 MG capsule Take 4 capsules by mouth 1 hour before dental treatment 4 capsule 4  ? calcium carbonate (TUMS - DOSED IN MG ELEMENTAL CALCIUM) 500 MG chewable tablet Chew 1 tablet by mouth as needed for indigestion or heartburn.    ? Carboxymethylcellul-Glycerin (LUBRICATING EYE DROPS OP) Place 1 drop into both eyes as needed (dry eyes).     ? Cholecalciferol (VITAMIN D3) 3000 units TABS Take 1,000 Units by mouth daily.     ? citalopram (CELEXA) 20 MG tablet TAKE 1/2 TABLET ONCE A DAY 45 tablet 1  ? Cyanocobalamin (B-12 PO) Take 1 tablet by mouth daily.     ? levothyroxine (SYNTHROID) 125 MCG tablet Take 1 tablet (125 mcg total) by mouth daily before breakfast. NEEDS LABS 90 tablet 0  ? lisinopril (ZESTRIL) 2.5 MG tablet TAKE 1 TABLET EVERY DAY 90 tablet 1  ? montelukast (SINGULAIR) 10 MG tablet Take 1 tablet (10 mg total) by mouth at bedtime. 90 tablet 3  ? pantoprazole (PROTONIX) 40 MG tablet TAKE 1 TABLET EVERY DAY 90 tablet 0  ? promethazine (PHENERGAN) 25 MG tablet Take 1 tablet by mouth as needed.    ? rosuvastatin (CRESTOR) 10 MG tablet Take 1 tablet (10 mg total) by mouth daily. NEEDS LABS 90 tablet 0  ? hydrochlorothiazide (MICROZIDE) 12.5 MG capsule Take 1 capsule (12.5 mg total) by mouth daily. NEEDS LABS (Patient not taking: Reported on 12/17/2021) 90 capsule 0  ? [DISCONTINUED] esomeprazole (NEXIUM) 40 MG capsule TAKE 1 CAPSULE (40 MG TOTAL) BY MOUTH DAILY BEFORE BREAKFAST. (Patient taking differently: Take 40 mg by mouth daily before breakfast.) 90 capsule 3  ? ?No current facility-administered medications on file prior to visit.  ? ?  ?PAST SURGICAL HISTORY ?Past Surgical History:  ?Procedure Laterality Date  ? ABDOMINAL HYSTERECTOMY    ? ANTERIOR CERVICAL DECOMP/DISCECTOMY FUSION   05/28/2020  ? CARPAL TUNNEL RELEASE Right   ? CARPAL TUNNEL RELEASE Left 02/25/2017  ? Procedure: LEFT OPEN CARPAL TUNNEL RELEASE;  Surgeon: [name], MD;  Location: [place];  Service: Orthopedics;  Laterality: Left;  ? CARPAL TUNNEL RELEASE Left 1998  ? CARPAL TUNNEL RELEASE Right 2019  ? COLONOSCOPY    ? KNEE ARTHROSCOPY Right 01/06/2018  ? Procedure: RIGHT KNEE ARTHROSCOPY WITH PARTIAL  MEDIAL MENISCECTOMY;  Surgeon: [name], MD;  Location: [place];  Service: Orthopedics;  Laterality: Right;  ? KNEE SURGERY    ? LARYNGOSCOPY N/A 05/30/2020  ? Procedure: LARYNGOSCOPY;  Surgeon: Leta Baptist, MD;  Location: [place];  Service: ENT;  Laterality: N/A;  ? NEPHRECTOMY    ? PARTIAL KNEE ARTHROPLASTY Right 03/31/2018  ? Procedure: RIGHT KNEE UNI ARTHROPLASTY;  Surgeon: Marchia Bond, MD;  Location: [place];  Service: Orthopedics;  Laterality: Right;  ? TONSILLECTOMY    ? TOTAL KNEE ARTHROPLASTY Left 09/14/2013  ? Procedure: LEFT TOTAL KNEE ARTHROPLASTY;  Surgeon: [name], MD;  Location: [place];  Service: Orthopedics;  Laterality: Left;  ? TUBAL LIGATION    ? WISDOM TOOTH EXTRACTION    ? WOUND EXPLORATION N/A 05/28/2020  ? Procedure: ANTERIOR CERVICAL WOUND EXPLORATION;  Surgeon: Erline Levine, MD;  Location: [place];  Service: Neurosurgery;  Laterality: N/A;  ? ? ?FAMILY HISTORY: ?Family History  ?Problem Relation Age of Onset  ? Diabetes Mother   ? Hypertension Mother   ? Heart disease Father   ? Hypertension Father   ? Thyroid disease Father   ? AAA (abdominal aortic aneurysm) Father   ? Cancer Maternal Aunt   ? Cancer Maternal Aunt   ? Diabetes Sister   ? Fibromyalgia Sister   ? Obstructive Sleep Apnea Sister   ? Kidney disease Daughter   ? Heart disease Son   ? Colon cancer Neg Hx   ? Esophageal cancer Neg Hx   ? Rectal cancer Neg Hx   ? Stomach cancer Neg Hx   ? ? ?SOCIAL HISTORY: ? reports that she quit smoking about 38 years ago. Her smoking use included cigarettes.  She has never used smokeless tobacco. She reports current alcohol use. She reports that she does not use drugs. ? ?PERFORMANCE STATUS: ?The patient's performance status is 1 - Symptomatic but completely ambu

## 2021-12-18 LAB — ERYTHROPOIETIN: Erythropoietin: 1.5 m[IU]/mL — ABNORMAL LOW (ref 2.6–18.5)

## 2021-12-19 ENCOUNTER — Encounter: Payer: Self-pay | Admitting: Family

## 2021-12-19 ENCOUNTER — Inpatient Hospital Stay: Payer: Medicare Other

## 2021-12-19 VITALS — BP 159/68 | HR 77 | Temp 98.2°F | Resp 17

## 2021-12-19 DIAGNOSIS — D751 Secondary polycythemia: Secondary | ICD-10-CM

## 2021-12-19 MED ORDER — SODIUM CHLORIDE 0.9 % IV SOLN: INTRAVENOUS | Status: DC

## 2021-12-19 NOTE — Patient Instructions (Signed)

## 2021-12-19 NOTE — Progress Notes (Signed)
[name] presents today for phlebotomy per MD orders. ?Phlebotomy procedure started at 1308 and ended at 1328. ?375 grams removed. Pt became light headed, IVF to gravity started.  ?500NS given and patient observed for 30 minutes after procedure without any incident. ?Patient tolerated procedure well. ?IV needle removed intact. ? ? ?

## 2021-12-22 ENCOUNTER — Ambulatory Visit (HOSPITAL_BASED_OUTPATIENT_CLINIC_OR_DEPARTMENT_OTHER): Payer: Medicare Other

## 2021-12-22 ENCOUNTER — Ambulatory Visit (HOSPITAL_BASED_OUTPATIENT_CLINIC_OR_DEPARTMENT_OTHER)
Admission: RE | Admit: 2021-12-22 | Discharge: 2021-12-22 | Disposition: A | Discharge status: Home / Self Care | Payer: Medicare Other | Source: Ambulatory Visit | Attending: Family | Admitting: Family

## 2021-12-22 DIAGNOSIS — D75839 Thrombocytosis, unspecified: Secondary | ICD-10-CM | POA: Diagnosis present

## 2021-12-22 DIAGNOSIS — D751 Secondary polycythemia: Secondary | ICD-10-CM | POA: Diagnosis not present

## 2021-12-23 LAB — JAK2 (INCLUDING V617F AND EXON 12), MPL,& CALR-NEXT GEN SEQ

## 2021-12-24 ENCOUNTER — Telehealth: Payer: Self-pay | Admitting: Family

## 2021-12-24 NOTE — Telephone Encounter (Signed)
Abdominal US and JAK 2 testing reviewed with Dr. Marin Olp and discussed with patient. She was found to be JAK 2 positive and have polycythemia with splenomegaly.  ?At this time we will continue her current phlebotomy program and reassess her response at follow-up in June. If counts remain elevated we will start her on Hydrea.  ?We will plan to repeat abdominal US in 6 months.  ?No questions or concerns at this time. Patient in agreement with the plan and appreciative of call.  ?

## 2021-12-26 ENCOUNTER — Inpatient Hospital Stay: Payer: Medicare Other

## 2021-12-26 VITALS — BP 155/78 | HR 74 | Temp 98.0°F | Resp 17

## 2021-12-26 DIAGNOSIS — D751 Secondary polycythemia: Secondary | ICD-10-CM

## 2021-12-26 NOTE — Progress Notes (Signed)
[name] presents today for phlebotomy per MD orders. Phlebotomy procedure started at 1313  and ended at 1334. 530 grams removed. Patient observed for 30 minutes after procedure without any incident. Patient tolerated procedure well. IV needle removed intact.

## 2021-12-26 NOTE — Patient Instructions (Signed)

## 2021-12-29 ENCOUNTER — Encounter: Payer: Self-pay | Admitting: Family

## 2021-12-29 ENCOUNTER — Other Ambulatory Visit: Payer: Self-pay | Admitting: Family

## 2022-01-02 ENCOUNTER — Inpatient Hospital Stay: Payer: Medicare Other

## 2022-01-02 DIAGNOSIS — D751 Secondary polycythemia: Secondary | ICD-10-CM | POA: Diagnosis not present

## 2022-01-02 NOTE — Progress Notes (Signed)
[name] presents today for phlebotomy per MD orders. Phlebotomy procedure started at 1324  and ended at 1355. 500 grams removed. Patient observed for 30 minutes after procedure without any incident. Patient tolerated procedure well. IV needle removed intact.

## 2022-01-08 ENCOUNTER — Other Ambulatory Visit: Payer: Self-pay | Admitting: Physician Assistant

## 2022-01-08 DIAGNOSIS — K2100 Gastro-esophageal reflux disease with esophagitis, without bleeding: Secondary | ICD-10-CM

## 2022-01-08 DIAGNOSIS — E039 Hypothyroidism, unspecified: Secondary | ICD-10-CM

## 2022-01-08 DIAGNOSIS — I10 Essential (primary) hypertension: Secondary | ICD-10-CM

## 2022-01-09 ENCOUNTER — Inpatient Hospital Stay: Payer: Medicare Other | Attending: Hematology & Oncology

## 2022-01-09 VITALS — BP 146/59 | HR 67 | Temp 98.6°F | Resp 18

## 2022-01-09 DIAGNOSIS — Z7901 Long term (current) use of anticoagulants: Secondary | ICD-10-CM | POA: Insufficient documentation

## 2022-01-09 DIAGNOSIS — D45 Polycythemia vera: Secondary | ICD-10-CM | POA: Insufficient documentation

## 2022-01-09 DIAGNOSIS — D751 Secondary polycythemia: Secondary | ICD-10-CM

## 2022-01-09 NOTE — Patient Instructions (Signed)

## 2022-01-09 NOTE — Progress Notes (Signed)
1 unit therapeutic phlebotomy performed over 18 minutes using a 20 gauge angiocath via right forearm. Patient tolerated well. Patient drinking fluids she brought from home.

## 2022-01-09 NOTE — Progress Notes (Signed)
Patient discharged ambulatory without complaints or concerns.

## 2022-01-15 ENCOUNTER — Encounter: Payer: Self-pay | Admitting: Cardiology

## 2022-01-16 ENCOUNTER — Other Ambulatory Visit: Payer: Self-pay

## 2022-01-16 ENCOUNTER — Other Ambulatory Visit: Payer: Self-pay | Admitting: Oncology

## 2022-01-16 ENCOUNTER — Encounter: Payer: Self-pay | Admitting: Family

## 2022-01-16 ENCOUNTER — Inpatient Hospital Stay: Payer: Medicare Other

## 2022-01-16 ENCOUNTER — Other Ambulatory Visit: Payer: Self-pay | Admitting: Family

## 2022-01-16 ENCOUNTER — Inpatient Hospital Stay (HOSPITAL_BASED_OUTPATIENT_CLINIC_OR_DEPARTMENT_OTHER): Payer: Medicare Other | Admitting: Family

## 2022-01-16 VITALS — BP 145/62 | HR 63 | Temp 98.0°F | Resp 18 | Ht 64.5 in | Wt 224.8 lb

## 2022-01-16 VITALS — BP 136/60 | HR 67 | Resp 18

## 2022-01-16 DIAGNOSIS — D751 Secondary polycythemia: Secondary | ICD-10-CM

## 2022-01-16 DIAGNOSIS — D45 Polycythemia vera: Secondary | ICD-10-CM | POA: Diagnosis not present

## 2022-01-16 LAB — CBC WITH DIFFERENTIAL (CANCER CENTER ONLY)
Abs Immature Granulocytes: 0.05 10*3/uL (ref 0.00–0.07)
Basophils Absolute: 0.1 10*3/uL (ref 0.0–0.1)
Basophils Relative: 1 %
Eosinophils Absolute: 0.5 10*3/uL (ref 0.0–0.5)
Eosinophils Relative: 5 %
HCT: 50.4 % — ABNORMAL HIGH (ref 36.0–46.0)
Hemoglobin: 15.3 g/dL — ABNORMAL HIGH (ref 12.0–15.0)
Immature Granulocytes: 1 %
Lymphocytes Relative: 18 %
Lymphs Abs: 1.7 10*3/uL (ref 0.7–4.0)
MCH: 24.8 pg — ABNORMAL LOW (ref 26.0–34.0)
MCHC: 30.4 g/dL (ref 30.0–36.0)
MCV: 81.7 fL (ref 80.0–100.0)
Monocytes Absolute: 0.5 10*3/uL (ref 0.1–1.0)
Monocytes Relative: 6 %
Neutro Abs: 6.8 10*3/uL (ref 1.7–7.7)
Neutrophils Relative %: 69 %
Platelet Count: 488 10*3/uL — ABNORMAL HIGH (ref 150–400)
RBC: 6.17 MIL/uL — ABNORMAL HIGH (ref 3.87–5.11)
RDW: 15.9 % — ABNORMAL HIGH (ref 11.5–15.5)
WBC Count: 9.7 10*3/uL (ref 4.0–10.5)
nRBC: 0 % (ref 0.0–0.2)

## 2022-01-16 LAB — CMP (CANCER CENTER ONLY)
ALT: 28 U/L (ref 0–44)
AST: 27 U/L (ref 15–41)
Albumin: 4.5 g/dL (ref 3.5–5.0)
Alkaline Phosphatase: 80 U/L (ref 38–126)
Anion gap: 6 (ref 5–15)
BUN: 24 mg/dL — ABNORMAL HIGH (ref 8–23)
CO2: 31 mmol/L (ref 22–32)
Calcium: 10.6 mg/dL — ABNORMAL HIGH (ref 8.9–10.3)
Chloride: 105 mmol/L (ref 98–111)
Creatinine: 1.39 mg/dL — ABNORMAL HIGH (ref 0.44–1.00)
GFR, Estimated: 42 mL/min — ABNORMAL LOW (ref >60)
Glucose, Bld: 131 mg/dL — ABNORMAL HIGH (ref 70–99)
Potassium: 5 mmol/L (ref 3.5–5.1)
Sodium: 142 mmol/L (ref 135–145)
Total Bilirubin: 0.7 mg/dL (ref 0.3–1.2)
Total Protein: 6.6 g/dL (ref 6.5–8.1)

## 2022-01-16 LAB — LACTATE DEHYDROGENASE: LDH: 312 U/L — ABNORMAL HIGH (ref 98–192)

## 2022-01-16 NOTE — Patient Instructions (Signed)

## 2022-01-16 NOTE — Progress Notes (Signed)
Hematology and Oncology Follow Up Visit  [name] [id number] May 27, 1956 67 y.o. 01/16/2022   Principle Diagnosis:  Polycythemia vera, JAK 2 positive   Current Therapy:   Phlebotomy as indicated to maintain Hct < 45%   Interim History:  [name] is here today with her husband for follow-up. She had weekly phlebotomy for the last 4 weeks and has tolerated nicely.  She notes that she is feeling much better. Her SOB, palpitations, abdominal bloating and pain have improved significantly.  She is no longer having headaches.  She is checking her blood pressure daily and followed closely by her PCP.  No fever, chills, n/v, cough, rash, dizziness, chest pain or changes in bowel or bladder habits.  No swelling or tenderness in her extremities.  She rarely notes tingling in her fingertips anymore.  No falls or syncope.  Appetite and hydration have been good. Weight is 224 lbs.   ECOG Performance Status: 1 - Symptomatic but completely ambulatory  Medications:  Allergies as of 01/16/2022       Reactions   Percocet [oxycodone-acetaminophen] Other (See Comments)   SYNCOPE TACHYCARDIA   Shrimp [shellfish Allergy] Anaphylaxis, Itching, Nausea And Vomiting, Swelling   Hydrocodone-acetaminophen Nausea And Vomiting, Other (See Comments)   Dizziness, also   Lipitor [atorvastatin] Other (See Comments)   Ear pain/fullness in ear "as if I were under water"   Saxenda [liraglutide -weight Management] Hives, Itching, Other (See Comments)   Hives/itching/site reaction   Adhesive [tape] Rash, Other (See Comments)   EKG leads and certain bandages break out the skin   Belviq Xr [lorcaserin Hcl Er] Other (See Comments)   Gained weight    Ferrous Sulfate Nausea Only   Morphine And Related Nausea And Vomiting        Medication List        Accurate as of January 16, 2022 11:18 AM. If you have any questions, ask your nurse or doctor.          acetaminophen 325 MG tablet Commonly known as:  TYLENOL Take 325-650 mg by mouth every 6 (six) hours as needed for mild pain or headache.   albuterol 108 (90 Base) MCG/ACT inhaler Commonly known as: VENTOLIN HFA Inhale 2 puffs into the lungs every 6 (six) hours as needed for wheezing or shortness of breath.   amoxicillin 500 MG capsule Commonly known as: AMOXIL Take 4 capsules by mouth 1 hour before dental treatment   B-12 PO Take 1 tablet by mouth daily.   calcium carbonate 500 MG chewable tablet Commonly known as: TUMS - dosed in mg elemental calcium Chew 1 tablet by mouth as needed for indigestion or heartburn.   citalopram 20 MG tablet Commonly known as: CELEXA TAKE 1/2 TABLET ONCE A DAY   hydrochlorothiazide 12.5 MG capsule Commonly known as: MICROZIDE TAKE 1 CAPSULE (12.5 MG TOTAL) BY MOUTH DAILY. NEEDS LABS   levothyroxine 125 MCG tablet Commonly known as: SYNTHROID TAKE 1 TABLET (125 MCG TOTAL) BY MOUTH DAILY BEFORE BREAKFAST. NEEDS LABS   lisinopril 2.5 MG tablet Commonly known as: ZESTRIL TAKE 1 TABLET EVERY DAY   LUBRICATING EYE DROPS OP Place 1 drop into both eyes as needed (dry eyes).   pantoprazole 40 MG tablet Commonly known as: PROTONIX TAKE 1 TABLET BY MOUTH EVERY DAY   promethazine 25 MG tablet Commonly known as: PHENERGAN Take 1 tablet by mouth as needed.   rosuvastatin 10 MG tablet Commonly known as: CRESTOR Take 1 tablet (10 mg total) by mouth  daily. NEEDS LABS   Vitamin D3 75 MCG (3000 UT) Tabs Take 1,000 Units by mouth daily.        Allergies:  Allergies  Allergen Reactions   Percocet [Oxycodone-Acetaminophen] Other (See Comments)    SYNCOPE TACHYCARDIA   Shrimp [Shellfish Allergy] Anaphylaxis, Itching, Nausea And Vomiting and Swelling   Hydrocodone-Acetaminophen Nausea And Vomiting and Other (See Comments)    Dizziness, also   Lipitor [Atorvastatin] Other (See Comments)    Ear pain/fullness in ear "as if I were under water"   Saxenda [Liraglutide -Weight Management] Hives,  Itching and Other (See Comments)    Hives/itching/site reaction   Adhesive [Tape] Rash and Other (See Comments)    EKG leads and certain bandages break out the skin   Belviq Xr [Lorcaserin Hcl Er] Other (See Comments)    Gained weight    Ferrous Sulfate Nausea Only   Morphine And Related Nausea And Vomiting    Past Medical History, Surgical history, Social history, and Family History were reviewed and updated.  Review of Systems: All other 10 point review of systems is negative.   Physical Exam:  height is 5' 4.5" (1.638 m) and weight is 224 lb 12.8 oz (102 kg). Her oral temperature is 98 F (36.7 C). Her blood pressure is 145/62 (abnormal) and her pulse is 63. Her respiration is 18 and oxygen saturation is 97%.   Wt Readings from Last 3 Encounters:  01/16/22 224 lb 12.8 oz (102 kg)  12/17/21 223 lb 12.8 oz (101.5 kg)  12/09/21 228 lb 9.6 oz (103.7 kg)    Ocular: Sclerae unicteric, pupils equal, round and reactive to light Ear-nose-throat: Oropharynx clear, dentition fair Lymphatic: No cervical or supraclavicular adenopathy Lungs no rales or rhonchi, good excursion bilaterally Heart regular rate and rhythm, no murmur appreciated Abd soft, nontender, positive bowel sounds MSK no focal spinal tenderness, no joint edema Neuro: non-focal, well-oriented, appropriate affect Breasts: Deferred   Lab Results  Component Value Date   WBC 9.7 01/16/2022   HGB 15.3 (H) 01/16/2022   HCT 50.4 (H) 01/16/2022   MCV 81.7 01/16/2022   PLT 488 (H) 01/16/2022   Lab Results  Component Value Date   FERRITIN 18 11/13/2021   IRON 66 11/13/2021   TIBC 503 (H) 11/13/2021   IRONPCTSAT 13 (L) 11/13/2021   Lab Results  Component Value Date   RETICCTPCT 2.6 12/17/2021   RBC 6.17 (H) 01/16/2022   No results found for: "KPAFRELGTCHN", "LAMBDASER", "KAPLAMBRATIO" No results found for: "IGGSERUM", "IGA", "IGMSERUM" No results found for: "TOTALPROTELP", "ALBUMINELP", "A1GS", "A2GS", "BETS",  "BETA2SER", "GAMS", "MSPIKE", "SPEI"   Chemistry      Component Value Date/Time   NA 142 01/16/2022 0938   K 5.0 01/16/2022 0938   CL 105 01/16/2022 0938   CO2 31 01/16/2022 0938   BUN 24 (H) 01/16/2022 0938   CREATININE 1.39 (H) 01/16/2022 0938   CREATININE 1.18 (H) 12/11/2021 1221      Component Value Date/Time   CALCIUM 10.6 (H) 01/16/2022 0938   ALKPHOS 80 01/16/2022 0938   AST 27 01/16/2022 0938   ALT 28 01/16/2022 0938   BILITOT 0.7 01/16/2022 0938       Impression and Plan: [name] is a very pleasant 66 yo caucasian female with a 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  I spoke with Dr. Marin Olp and patient has responded nicely to phlebotomy so we will not add medication at this time.  She will continue taking her daily aspirin with food.  Lab check and phlebotomy every 2 weeks to maintain Hct < 45%.  Follow-up in 8 weeks.   Lottie Dawson, NP 6/9/202311:18 AM

## 2022-01-16 NOTE — Progress Notes (Signed)
[name] presents today for phlebotomy per MD orders. Phlebotomy procedure started at 1100 and ended at 1110 510 grams removed via 20 gauge needle to right AC.  Patient observed for 10 minutes after procedure without any incident. She declined staying 30 minutes.  Patient tolerated procedure well and received replacement fluids after procedure.  Patient understands to call if he has any questions or concerns post discharge.

## 2022-01-30 ENCOUNTER — Inpatient Hospital Stay: Payer: Medicare Other

## 2022-01-30 VITALS — BP 146/65 | HR 73 | Temp 98.5°F | Resp 18

## 2022-01-30 DIAGNOSIS — D751 Secondary polycythemia: Secondary | ICD-10-CM

## 2022-01-30 DIAGNOSIS — D45 Polycythemia vera: Secondary | ICD-10-CM | POA: Diagnosis not present

## 2022-01-30 LAB — CMP (CANCER CENTER ONLY)
ALT: 27 U/L (ref 0–44)
AST: 25 U/L (ref 15–41)
Albumin: 4.5 g/dL (ref 3.5–5.0)
Alkaline Phosphatase: 82 U/L (ref 38–126)
Anion gap: 9 (ref 5–15)
BUN: 17 mg/dL (ref 8–23)
CO2: 27 mmol/L (ref 22–32)
Calcium: 10.3 mg/dL (ref 8.9–10.3)
Chloride: 106 mmol/L (ref 98–111)
Creatinine: 1.3 mg/dL — ABNORMAL HIGH (ref 0.44–1.00)
GFR, Estimated: 45 mL/min — ABNORMAL LOW (ref >60)
Glucose, Bld: 138 mg/dL — ABNORMAL HIGH (ref 70–99)
Potassium: 4.6 mmol/L (ref 3.5–5.1)
Sodium: 142 mmol/L (ref 135–145)
Total Bilirubin: 0.7 mg/dL (ref 0.3–1.2)
Total Protein: 6.9 g/dL (ref 6.5–8.1)

## 2022-01-30 LAB — CBC WITH DIFFERENTIAL (CANCER CENTER ONLY)
Abs Immature Granulocytes: 0.06 10*3/uL (ref 0.00–0.07)
Basophils Absolute: 0.1 10*3/uL (ref 0.0–0.1)
Basophils Relative: 1 %
Eosinophils Absolute: 0.6 10*3/uL — ABNORMAL HIGH (ref 0.0–0.5)
Eosinophils Relative: 5 %
HCT: 47.5 % — ABNORMAL HIGH (ref 36.0–46.0)
Hemoglobin: 14.4 g/dL (ref 12.0–15.0)
Immature Granulocytes: 1 %
Lymphocytes Relative: 18 %
Lymphs Abs: 1.9 10*3/uL (ref 0.7–4.0)
MCH: 23.8 pg — ABNORMAL LOW (ref 26.0–34.0)
MCHC: 30.3 g/dL (ref 30.0–36.0)
MCV: 78.6 fL — ABNORMAL LOW (ref 80.0–100.0)
Monocytes Absolute: 0.7 10*3/uL (ref 0.1–1.0)
Monocytes Relative: 6 %
Neutro Abs: 7.4 10*3/uL (ref 1.7–7.7)
Neutrophils Relative %: 69 %
Platelet Count: 513 10*3/uL — ABNORMAL HIGH (ref 150–400)
RBC: 6.04 MIL/uL — ABNORMAL HIGH (ref 3.87–5.11)
RDW: 15.6 % — ABNORMAL HIGH (ref 11.5–15.5)
WBC Count: 10.7 10*3/uL — ABNORMAL HIGH (ref 4.0–10.5)
nRBC: 0.2 % (ref 0.0–0.2)

## 2022-02-13 ENCOUNTER — Other Ambulatory Visit: Payer: Self-pay | Admitting: Family

## 2022-02-13 ENCOUNTER — Inpatient Hospital Stay: Payer: Medicare Other

## 2022-02-13 ENCOUNTER — Inpatient Hospital Stay: Payer: Medicare Other | Attending: Hematology & Oncology

## 2022-02-13 VITALS — BP 137/74 | HR 74 | Temp 98.3°F | Resp 18

## 2022-02-13 DIAGNOSIS — D751 Secondary polycythemia: Secondary | ICD-10-CM

## 2022-02-13 DIAGNOSIS — D45 Polycythemia vera: Secondary | ICD-10-CM | POA: Insufficient documentation

## 2022-02-13 LAB — CBC WITH DIFFERENTIAL (CANCER CENTER ONLY)
Abs Immature Granulocytes: 0.08 10*3/uL — ABNORMAL HIGH (ref 0.00–0.07)
Basophils Absolute: 0.1 10*3/uL (ref 0.0–0.1)
Basophils Relative: 1 %
Eosinophils Absolute: 0.6 10*3/uL — ABNORMAL HIGH (ref 0.0–0.5)
Eosinophils Relative: 5 %
HCT: 48 % — ABNORMAL HIGH (ref 36.0–46.0)
Hemoglobin: 14.6 g/dL (ref 12.0–15.0)
Immature Granulocytes: 1 %
Lymphocytes Relative: 14 %
Lymphs Abs: 1.6 10*3/uL (ref 0.7–4.0)
MCH: 22.9 pg — ABNORMAL LOW (ref 26.0–34.0)
MCHC: 30.4 g/dL (ref 30.0–36.0)
MCV: 75.2 fL — ABNORMAL LOW (ref 80.0–100.0)
Monocytes Absolute: 0.6 10*3/uL (ref 0.1–1.0)
Monocytes Relative: 5 %
Neutro Abs: 8.6 10*3/uL — ABNORMAL HIGH (ref 1.7–7.7)
Neutrophils Relative %: 74 %
Platelet Count: 512 10*3/uL — ABNORMAL HIGH (ref 150–400)
RBC: 6.38 MIL/uL — ABNORMAL HIGH (ref 3.87–5.11)
RDW: 17.3 % — ABNORMAL HIGH (ref 11.5–15.5)
WBC Count: 11.6 10*3/uL — ABNORMAL HIGH (ref 4.0–10.5)
nRBC: 0 % (ref 0.0–0.2)

## 2022-02-13 LAB — CMP (CANCER CENTER ONLY)
ALT: 26 U/L (ref 0–44)
AST: 25 U/L (ref 15–41)
Albumin: 4.8 g/dL (ref 3.5–5.0)
Alkaline Phosphatase: 84 U/L (ref 38–126)
Anion gap: 9 (ref 5–15)
BUN: 21 mg/dL (ref 8–23)
CO2: 26 mmol/L (ref 22–32)
Calcium: 10.4 mg/dL — ABNORMAL HIGH (ref 8.9–10.3)
Chloride: 103 mmol/L (ref 98–111)
Creatinine: 1.3 mg/dL — ABNORMAL HIGH (ref 0.44–1.00)
GFR, Estimated: 45 mL/min — ABNORMAL LOW (ref >60)
Glucose, Bld: 142 mg/dL — ABNORMAL HIGH (ref 70–99)
Potassium: 4.1 mmol/L (ref 3.5–5.1)
Sodium: 138 mmol/L (ref 135–145)
Total Bilirubin: 0.8 mg/dL (ref 0.3–1.2)
Total Protein: 7 g/dL (ref 6.5–8.1)

## 2022-02-13 MED ORDER — SODIUM CHLORIDE 0.9 % IV SOLN: INTRAVENOUS | Status: DC

## 2022-02-13 NOTE — Patient Instructions (Signed)

## 2022-02-13 NOTE — Progress Notes (Signed)
[name] presents today for phlebotomy per MD orders. Phlebotomy procedure started at 1046 and ended at 1110. 528 cc removed via 18 G needle at [facility] site. Patient tolerated procedure well. IV needle removed intact.Patient refused to wait 30 min post phlebotomy, released stable and ASX.

## 2022-02-18 ENCOUNTER — Ambulatory Visit (INDEPENDENT_AMBULATORY_CARE_PROVIDER_SITE_OTHER): Payer: Medicare Other | Admitting: Physician Assistant

## 2022-02-18 VITALS — BP 147/76 | HR 107 | Temp 99.7°F | Ht 64.5 in | Wt 218.0 lb

## 2022-02-18 DIAGNOSIS — D751 Secondary polycythemia: Secondary | ICD-10-CM | POA: Diagnosis not present

## 2022-02-18 DIAGNOSIS — E781 Pure hyperglyceridemia: Secondary | ICD-10-CM

## 2022-02-18 DIAGNOSIS — E782 Mixed hyperlipidemia: Secondary | ICD-10-CM | POA: Diagnosis not present

## 2022-02-18 DIAGNOSIS — J329 Chronic sinusitis, unspecified: Secondary | ICD-10-CM

## 2022-02-18 DIAGNOSIS — I10 Essential (primary) hypertension: Secondary | ICD-10-CM

## 2022-02-18 DIAGNOSIS — Z9989 Dependence on other enabling machines and devices: Secondary | ICD-10-CM

## 2022-02-18 DIAGNOSIS — F411 Generalized anxiety disorder: Secondary | ICD-10-CM

## 2022-02-18 DIAGNOSIS — J40 Bronchitis, not specified as acute or chronic: Secondary | ICD-10-CM

## 2022-02-18 DIAGNOSIS — E039 Hypothyroidism, unspecified: Secondary | ICD-10-CM

## 2022-02-18 DIAGNOSIS — G4733 Obstructive sleep apnea (adult) (pediatric): Secondary | ICD-10-CM

## 2022-02-18 MED ORDER — PREDNISONE 50 MG PO TABS: ORAL_TABLET | ORAL | 0 refills | Status: DC

## 2022-02-18 MED ORDER — LISINOPRIL 5 MG PO TABS: 5.0000 mg | ORAL_TABLET | Freq: Every day | ORAL | 0 refills | Status: DC

## 2022-02-18 MED ORDER — ROSUVASTATIN CALCIUM 10 MG PO TABS: 10.0000 mg | ORAL_TABLET | Freq: Every day | ORAL | 3 refills | Status: DC

## 2022-02-18 MED ORDER — LEVOTHYROXINE SODIUM 125 MCG PO TABS: 125.0000 ug | ORAL_TABLET | Freq: Every day | ORAL | 1 refills | Status: DC

## 2022-02-18 MED ORDER — AZITHROMYCIN 250 MG PO TABS: ORAL_TABLET | ORAL | 0 refills | Status: DC

## 2022-02-18 MED ORDER — CITALOPRAM HYDROBROMIDE 20 MG PO TABS: ORAL_TABLET | ORAL | 3 refills | Status: DC

## 2022-02-18 NOTE — Progress Notes (Signed)
Established Patient Office Visit  Subjective   Patient ID: [name], female    DOB: 1956/05/22  Age: 66 y.o. MRN: [id number]  Chief Complaint  Patient presents with   Hypertension    HPI Pt is a 66 yo obese female with HTN, HLD, Polycythemia, Migraines, OSA, anxiety, depression, hypothyroidism who presents to the clinic for follow up and medication refills.   Pt is doing well with hematology and feels better overall.   In the past 5 days had URI symptoms of sinus pressure, ST, ear popping, coughing, chest tightness, fever, body aches. She was negative for covid. She is taking OTC cough medications and albuterol inhaler and does not seem to be getting much better.   Pt denies any CP, palpitations, headaches or vision changes. She is taking her medication daily. She did stop HCTZ due to dehydration risk with Polycythemia. BP at home have been elevated as well in the 140s or 150s.   Mood doing well. No concerns. No SI/HC.    Pt got alert that she needs new CPAP. Uses nightly. Needs order for [facility].  .. Active Ambulatory Problems    Diagnosis Date Noted   UTI (urinary tract infection) 07/14/2012   Other and unspecified hyperlipidemia 07/14/2012   MVP (mitral valve prolapse) 07/14/2012   GERD (gastroesophageal reflux disease) 07/14/2012   Allergic rhinitis 07/14/2012   Mitral regurgitation 07/14/2012   S/P hysterectomy 07/14/2012   Essential hypertension, benign 04/29/2013   Wheezing 04/29/2013   Insomnia 04/29/2013   Unspecified vitamin D deficiency 04/29/2013   Migraine headache 04/29/2013   Anxiety state 04/29/2013   Unspecified hypothyroidism 04/29/2013   Obesity (BMI 30-39.9) 07/17/2013   Cough 08/15/2013   Arthritis of left knee 09/14/2013   Status post total knee replacement 09/14/2013   Abdominal pain, chronic, right upper quadrant 11/16/2013   Candidiasis of female genitalia 11/16/2013   Elevated liver enzymes 62/69/4854   Folliculitis 62/70/3500    Nausea alone 11/18/2013   Abnormal transaminases 11/18/2013   Hyperlipidemia 11/22/2014   Single kidney 11/22/2014   Hypothyroidism 11/22/2014   OSA on CPAP 12/23/2015   Daytime sleepiness 01/24/2017   Carpal tunnel syndrome, left upper limb 02/25/2017   Status post carpal tunnel release 03/11/2017   Hypertriglyceridemia 07/14/2017   Elevated fasting glucose 07/14/2017   Chronic pain of right knee 09/13/2017   Unilateral primary osteoarthritis, right knee 09/27/2017   Pre-diabetes 10/19/2017   Pain in thumb joint with movement of right hand 10/19/2017   Class 2 obesity due to excess calories without serious comorbidity with body mass index (BMI) of 35.0 to 35.9 in adult 10/19/2017   Other meniscus derangements, posterior horn of medial meniscus, right knee 11/24/2017   Pain of right thumb 11/24/2017   Trigger thumb, right thumb 11/24/2017   BPPV (benign paroxysmal positional vertigo), right 02/20/2018   Concussion wth loss of consciousness of 30 minutes or less 02/20/2018   Fall 02/20/2018   CKD (chronic kidney disease) stage 3, GFR 30-59 ml/min (HCC) 03/10/2019   BPPV (benign paroxysmal positional vertigo), left 05/22/2019   Pain radiating to back 05/23/2019   Elevated lipase 06/12/2019   Seborrheic keratoses 01/05/2020   Papules 01/05/2020   Class 2 obesity due to excess calories without serious comorbidity with body mass index (BMI) of 37.0 to 37.9 in adult 01/05/2020   Anxiety and depression 06/03/2014   Fracture of triquetral bone of wrist 02/21/2018   Primary osteoarthritis involving multiple joints 06/03/2014   Degenerative disc disease, cervical 04/11/2020  Right arm pain 04/16/2020   Right arm weakness 04/16/2020   Iron deficiency 04/17/2020   Airway compromise 05/28/2020   Laryngospasm 05/28/2020   Status post cervical spinal fusion 06/04/2020   SOB (shortness of breath) 06/04/2020   History of laryngeal spasm 06/04/2020   Atelectasis 06/04/2020   Frequent  headaches 10/14/2020   Epidermal cyst 03/07/2021   Abnormal kidney function 03/10/2021   Iron deficiency anemia 03/10/2021   Asthmatic bronchitis 11/13/2021   Palpitations 12/09/2021   Polycythemia 12/09/2021   Sinobronchitis 02/20/2022   Resolved Ambulatory Problems    Diagnosis Date Noted   Hypothyroidism 07/14/2012   Past Medical History:  Diagnosis Date   Acute medial meniscal tear    Allergy    Anxiety    Aortic insufficiency    Aortic regurgitation    Arthritis    Carpal tunnel syndrome    Chronic kidney disease    Dysrhythmia    Mitral valve prolapse    Need for SBE (subacute bacterial endocarditis) prophylaxis    Pilonidal cyst without mention of abscess    Pneumonia Aug 14 2012   PONV (postoperative nausea and vomiting)    Sleep apnea    Thyroid disease    Transplanted kidney removed      ROS    Objective:     BP (!) 147/76   Pulse (!) 107   Temp 99.7 F (37.6 C) (Oral)   Ht 5' 4.5" (1.638 m)   Wt 218 lb (98.9 kg)   SpO2 98%   BMI 36.84 kg/m  BP Readings from Last 3 Encounters:  02/18/22 (!) 147/76  02/13/22 137/74  01/30/22 (!) 146/65   Wt Readings from Last 3 Encounters:  02/18/22 218 lb (98.9 kg)  01/16/22 224 lb 12.8 oz (102 kg)  12/17/21 223 lb 12.8 oz (101.5 kg)      Physical Exam Constitutional:      Appearance: Normal appearance. She is obese.  HENT:     Head: Normocephalic.     Right Ear: Tympanic membrane, ear canal and external ear normal. There is no impacted cerumen.     Left Ear: Tympanic membrane, ear canal and external ear normal. There is no impacted cerumen.     Nose: Congestion and rhinorrhea present.     Mouth/Throat:     Mouth: Mucous membranes are moist.     Pharynx: Posterior oropharyngeal erythema present. No oropharyngeal exudate.  Eyes:     General:        Right eye: No discharge.        Left eye: No discharge.     Conjunctiva/sclera: Conjunctivae normal.  Neck:     Vascular: No carotid bruit.   Cardiovascular:     Rate and Rhythm: Normal rate and regular rhythm.  Pulmonary:     Effort: Pulmonary effort is normal.     Breath sounds: Normal breath sounds. No wheezing or rhonchi.  Musculoskeletal:     Cervical back: Neck supple.  Lymphadenopathy:     Cervical: Cervical adenopathy present.  Neurological:     General: No focal deficit present.     Mental Status: She is alert and oriented to person, place, and time.  Psychiatric:        Mood and Affect: Mood normal.         Assessment & Plan:  Marland KitchenMarland KitchenNathalie was seen today for hypertension.  Diagnoses and all orders for this visit:  Essential hypertension, benign -     lisinopril (ZESTRIL) 5 MG tablet; Take  1 tablet (5 mg total) by mouth daily.  Polycythemia  Mixed hyperlipidemia -     rosuvastatin (CRESTOR) 10 MG tablet; Take 1 tablet (10 mg total) by mouth daily.  Hypertriglyceridemia -     rosuvastatin (CRESTOR) 10 MG tablet; Take 1 tablet (10 mg total) by mouth daily.  Hypothyroidism, unspecified type -     levothyroxine (SYNTHROID) 125 MCG tablet; Take 1 tablet (125 mcg total) by mouth daily before breakfast.  Anxiety state -     citalopram (CELEXA) 20 MG tablet; TAKE 1/2 TABLET ONCE A DAY  Sinobronchitis -     predniSONE (DELTASONE) 50 MG tablet; One tab PO daily for 5 days. -     azithromycin (ZITHROMAX Z-PAK) 250 MG tablet; Take 2 tablets (500 mg) on  Day 1,  followed by 1 tablet (250 mg) once daily on Days 2 through 5.  OSA on CPAP  BP not to goal She has also been getting high readings at home she did have to go off her HCTZ due to Polycythemia Increased lisinopril to '5mg'$  Follow up in 3 months  Pt is being managed for polycythemia with blood draws  Discussed sinobronchitis Started prednisone and continue to use albuterol inhaler ok to add zpak if not improving in the next few days Continue mucinex  Rest and hydrate  Mood doing great Refilled celexa  Synthroid and crestor refilled  OSA on CPAP  needs new machine  Return in about 3 months (around 05/21/2022) for F/U HTN.    Iran Planas, PA-C

## 2022-02-20 ENCOUNTER — Encounter: Payer: Self-pay | Admitting: Physician Assistant

## 2022-02-20 DIAGNOSIS — J329 Chronic sinusitis, unspecified: Secondary | ICD-10-CM | POA: Insufficient documentation

## 2022-02-23 ENCOUNTER — Telehealth: Payer: Self-pay | Admitting: Neurology

## 2022-02-23 NOTE — Telephone Encounter (Signed)
CPAP order faxed to [facility] for updated machine to [phone] with confirmation received.

## 2022-02-25 ENCOUNTER — Encounter: Payer: Self-pay | Admitting: Physician Assistant

## 2022-02-25 DIAGNOSIS — R509 Fever, unspecified: Secondary | ICD-10-CM

## 2022-02-25 DIAGNOSIS — R051 Acute cough: Secondary | ICD-10-CM

## 2022-02-26 ENCOUNTER — Ambulatory Visit (INDEPENDENT_AMBULATORY_CARE_PROVIDER_SITE_OTHER): Payer: Medicare Other

## 2022-02-26 ENCOUNTER — Telehealth: Payer: Self-pay | Admitting: Neurology

## 2022-02-26 ENCOUNTER — Encounter: Payer: Self-pay | Admitting: Physician Assistant

## 2022-02-26 DIAGNOSIS — R509 Fever, unspecified: Secondary | ICD-10-CM | POA: Diagnosis not present

## 2022-02-26 DIAGNOSIS — R051 Acute cough: Secondary | ICD-10-CM

## 2022-02-26 DIAGNOSIS — G4733 Obstructive sleep apnea (adult) (pediatric): Secondary | ICD-10-CM

## 2022-02-26 NOTE — Telephone Encounter (Signed)
Order for new CPAP faxed to [facility]. Sent sleep study and notes before sleep study per their request to [phone] with confirmation received.

## 2022-02-27 ENCOUNTER — Inpatient Hospital Stay: Payer: Medicare Other

## 2022-02-27 ENCOUNTER — Telehealth: Payer: Self-pay | Admitting: *Deleted

## 2022-02-27 ENCOUNTER — Other Ambulatory Visit: Payer: Self-pay | Admitting: Physician Assistant

## 2022-02-27 MED ORDER — LEVOFLOXACIN 750 MG PO TABS: 750.0000 mg | ORAL_TABLET | Freq: Every day | ORAL | 0 refills | Status: DC

## 2022-02-27 NOTE — Progress Notes (Signed)
Left upper lobe pneumonia. Levaquin sent for 7 days. Follow up CXR in 4 weeks.

## 2022-02-27 NOTE — Telephone Encounter (Signed)
Message received from patient stating that she was diagnosed with pneumonia yesterday and would like to know if she should come in today for scheduled phlebotomy.   Message sent to scheduling to call pt and reschedule her out times one week.

## 2022-03-03 NOTE — Telephone Encounter (Signed)
[facility] called back, needs CPAP faxed with setting. New order placed and faxed to [facility] with confirmation received.

## 2022-03-03 NOTE — Addendum Note (Signed)
Addended byAnnamaria Helling on: 03/03/2022 10:51 AM   Modules accepted: Orders

## 2022-03-06 ENCOUNTER — Inpatient Hospital Stay: Payer: Medicare Other

## 2022-03-06 ENCOUNTER — Other Ambulatory Visit: Payer: Medicare Other

## 2022-03-06 DIAGNOSIS — D751 Secondary polycythemia: Secondary | ICD-10-CM

## 2022-03-06 DIAGNOSIS — D45 Polycythemia vera: Secondary | ICD-10-CM | POA: Diagnosis not present

## 2022-03-06 LAB — CBC WITH DIFFERENTIAL (CANCER CENTER ONLY)
Abs Immature Granulocytes: 0.07 10*3/uL (ref 0.00–0.07)
Basophils Absolute: 0.1 10*3/uL (ref 0.0–0.1)
Basophils Relative: 1 %
Eosinophils Absolute: 0.6 10*3/uL — ABNORMAL HIGH (ref 0.0–0.5)
Eosinophils Relative: 5 %
HCT: 44.7 % (ref 36.0–46.0)
Hemoglobin: 13 g/dL (ref 12.0–15.0)
Immature Granulocytes: 1 %
Lymphocytes Relative: 20 %
Lymphs Abs: 2 10*3/uL (ref 0.7–4.0)
MCH: 21.5 pg — ABNORMAL LOW (ref 26.0–34.0)
MCHC: 29.1 g/dL — ABNORMAL LOW (ref 30.0–36.0)
MCV: 73.8 fL — ABNORMAL LOW (ref 80.0–100.0)
Monocytes Absolute: 0.7 10*3/uL (ref 0.1–1.0)
Monocytes Relative: 7 %
Neutro Abs: 6.9 10*3/uL (ref 1.7–7.7)
Neutrophils Relative %: 66 %
Platelet Count: 504 10*3/uL — ABNORMAL HIGH (ref 150–400)
RBC: 6.06 MIL/uL — ABNORMAL HIGH (ref 3.87–5.11)
RDW: 17.9 % — ABNORMAL HIGH (ref 11.5–15.5)
WBC Count: 10.3 10*3/uL (ref 4.0–10.5)
nRBC: 0 % (ref 0.0–0.2)

## 2022-03-06 LAB — CMP (CANCER CENTER ONLY)
ALT: 24 U/L (ref 0–44)
AST: 25 U/L (ref 15–41)
Albumin: 4.4 g/dL (ref 3.5–5.0)
Alkaline Phosphatase: 82 U/L (ref 38–126)
Anion gap: 7 (ref 5–15)
BUN: 15 mg/dL (ref 8–23)
CO2: 27 mmol/L (ref 22–32)
Calcium: 10.6 mg/dL — ABNORMAL HIGH (ref 8.9–10.3)
Chloride: 108 mmol/L (ref 98–111)
Creatinine: 1.33 mg/dL — ABNORMAL HIGH (ref 0.44–1.00)
GFR, Estimated: 44 mL/min — ABNORMAL LOW (ref >60)
Glucose, Bld: 121 mg/dL — ABNORMAL HIGH (ref 70–99)
Potassium: 5 mmol/L (ref 3.5–5.1)
Sodium: 142 mmol/L (ref 135–145)
Total Bilirubin: 0.7 mg/dL (ref 0.3–1.2)
Total Protein: 6.5 g/dL (ref 6.5–8.1)

## 2022-03-06 NOTE — Progress Notes (Signed)
Reviewed pt labs with patient who needs phlebotomy for hematocrit greater than 45.  Pt hematocrit 44.7. Pt states she feels fine and is happy to wait until next week's appointment to see her lab values before having a phlebotomy today. Pt states she is still getting over pneumonia and has been fatigued but happy to not have a phlebotomy today. Pt aware to call clinic with any change in symptoms or concerns. Pt verbalized understanding and had no further questions. Pt aware of appointment next week.

## 2022-03-10 ENCOUNTER — Encounter: Payer: Self-pay | Admitting: Family

## 2022-03-11 ENCOUNTER — Inpatient Hospital Stay: Payer: Medicare Other | Attending: Hematology & Oncology

## 2022-03-11 ENCOUNTER — Encounter: Payer: Self-pay | Admitting: Family

## 2022-03-11 ENCOUNTER — Inpatient Hospital Stay: Payer: Medicare Other

## 2022-03-11 ENCOUNTER — Inpatient Hospital Stay (HOSPITAL_BASED_OUTPATIENT_CLINIC_OR_DEPARTMENT_OTHER): Payer: Medicare Other | Admitting: Family

## 2022-03-11 VITALS — BP 126/66 | HR 73 | Temp 98.1°F | Resp 18 | Wt 222.8 lb

## 2022-03-11 DIAGNOSIS — D751 Secondary polycythemia: Secondary | ICD-10-CM | POA: Diagnosis not present

## 2022-03-11 DIAGNOSIS — Z79899 Other long term (current) drug therapy: Secondary | ICD-10-CM | POA: Insufficient documentation

## 2022-03-11 DIAGNOSIS — Z7982 Long term (current) use of aspirin: Secondary | ICD-10-CM | POA: Diagnosis not present

## 2022-03-11 DIAGNOSIS — D45 Polycythemia vera: Secondary | ICD-10-CM | POA: Insufficient documentation

## 2022-03-11 LAB — CBC WITH DIFFERENTIAL (CANCER CENTER ONLY)
Abs Immature Granulocytes: 0.04 10*3/uL (ref 0.00–0.07)
Basophils Absolute: 0.1 10*3/uL (ref 0.0–0.1)
Basophils Relative: 1 %
Eosinophils Absolute: 0.7 10*3/uL — ABNORMAL HIGH (ref 0.0–0.5)
Eosinophils Relative: 8 %
HCT: 43.6 % (ref 36.0–46.0)
Hemoglobin: 12.7 g/dL (ref 12.0–15.0)
Immature Granulocytes: 0 %
Lymphocytes Relative: 18 %
Lymphs Abs: 1.7 10*3/uL (ref 0.7–4.0)
MCH: 21.1 pg — ABNORMAL LOW (ref 26.0–34.0)
MCHC: 29.1 g/dL — ABNORMAL LOW (ref 30.0–36.0)
MCV: 72.3 fL — ABNORMAL LOW (ref 80.0–100.0)
Monocytes Absolute: 0.6 10*3/uL (ref 0.1–1.0)
Monocytes Relative: 6 %
Neutro Abs: 6 10*3/uL (ref 1.7–7.7)
Neutrophils Relative %: 67 %
Platelet Count: 560 10*3/uL — ABNORMAL HIGH (ref 150–400)
RBC: 6.03 MIL/uL — ABNORMAL HIGH (ref 3.87–5.11)
RDW: 17.9 % — ABNORMAL HIGH (ref 11.5–15.5)
WBC Count: 9.1 10*3/uL (ref 4.0–10.5)
nRBC: 0 % (ref 0.0–0.2)

## 2022-03-11 LAB — CMP (CANCER CENTER ONLY)
ALT: 22 U/L (ref 0–44)
AST: 24 U/L (ref 15–41)
Albumin: 4.4 g/dL (ref 3.5–5.0)
Alkaline Phosphatase: 73 U/L (ref 38–126)
Anion gap: 7 (ref 5–15)
BUN: 13 mg/dL (ref 8–23)
CO2: 28 mmol/L (ref 22–32)
Calcium: 10.5 mg/dL — ABNORMAL HIGH (ref 8.9–10.3)
Chloride: 106 mmol/L (ref 98–111)
Creatinine: 1.22 mg/dL — ABNORMAL HIGH (ref 0.44–1.00)
GFR, Estimated: 49 mL/min — ABNORMAL LOW (ref >60)
Glucose, Bld: 148 mg/dL — ABNORMAL HIGH (ref 70–99)
Potassium: 4.5 mmol/L (ref 3.5–5.1)
Sodium: 141 mmol/L (ref 135–145)
Total Bilirubin: 0.6 mg/dL (ref 0.3–1.2)
Total Protein: 6.3 g/dL — ABNORMAL LOW (ref 6.5–8.1)

## 2022-03-11 LAB — LACTATE DEHYDROGENASE: LDH: 249 U/L — ABNORMAL HIGH (ref 98–192)

## 2022-03-11 NOTE — Progress Notes (Signed)
Hematology and Oncology Follow Up Visit  [name] [id number] [birth year] 66 y.o. 03/11/2022   Principle Diagnosis:  Polycythemia vera, JAK 2 positive    Current Therapy:        Phlebotomy as indicated to maintain Hct < 45%   Interim History:  [name] is here today with her husband for follow-up. She is doing well and has responded nicely to phlebotomy.  Hct is stable at this time.  She denies fatigue.  No bleeding, bruising or petechiae.  No fever, chills, n/v, cough, rash, dizziness, SOB, chest pain, palpitations, abdominal pain or changes in bowel or bladder habits.  No swelling, tenderness, numbness or tingling in her extremities.  No falls or syncope to report.  Appetite and hydration are good. Her weight is stable at 222 lbs.   ECOG Performance Status: 1 - Symptomatic but completely ambulatory  Medications:  Allergies as of 03/11/2022       Reactions   Percocet [oxycodone-acetaminophen] Other (See Comments)   SYNCOPE TACHYCARDIA   Shrimp [shellfish Allergy] Anaphylaxis, Itching, Nausea And Vomiting, Swelling   Hydrocodone-acetaminophen Nausea And Vomiting, Other (See Comments)   Dizziness, also   Lipitor [atorvastatin] Other (See Comments)   Ear pain/fullness in ear "as if I were under water"   Saxenda [liraglutide -weight Management] Hives, Itching, Other (See Comments)   Hives/itching/site reaction   Adhesive [tape] Rash, Other (See Comments)   EKG leads and certain bandages break out the skin   Belviq Xr [lorcaserin Hcl Er] Other (See Comments)   Gained weight    Ferrous Sulfate Nausea Only   Morphine And Related Nausea And Vomiting        Medication List        Accurate as of March 11, 2022 11:14 AM. If you have any questions, ask your nurse or doctor.          acetaminophen 325 MG tablet Commonly known as: TYLENOL Take 325-650 mg by mouth every 6 (six) hours as needed for mild pain or headache.   albuterol 108 (90 Base) MCG/ACT  inhaler Commonly known as: VENTOLIN HFA Inhale 2 puffs into the lungs every 6 (six) hours as needed for wheezing or shortness of breath.   Aspirin 81 MG Caps Take 81 mg by mouth daily at 6 (six) AM.   B-12 PO Take 1 tablet by mouth daily.   calcium carbonate 500 MG chewable tablet Commonly known as: TUMS - dosed in mg elemental calcium Chew 1 tablet by mouth as needed for indigestion or heartburn.   citalopram 20 MG tablet Commonly known as: CELEXA TAKE 1/2 TABLET ONCE A DAY   levofloxacin 750 MG tablet Commonly known as: Levaquin Take 1 tablet (750 mg total) by mouth daily. For 7 days.   levothyroxine 125 MCG tablet Commonly known as: SYNTHROID Take 1 tablet (125 mcg total) by mouth daily before breakfast.   lisinopril 5 MG tablet Commonly known as: ZESTRIL Take 1 tablet (5 mg total) by mouth daily.   LUBRICATING EYE DROPS OP Place 1 drop into both eyes as needed (dry eyes).   pantoprazole 40 MG tablet Commonly known as: PROTONIX TAKE 1 TABLET BY MOUTH EVERY DAY   promethazine 25 MG tablet Commonly known as: PHENERGAN Take 1 tablet by mouth as needed.   rosuvastatin 10 MG tablet Commonly known as: CRESTOR Take 1 tablet (10 mg total) by mouth daily.   Vitamin D3 75 MCG (3000 UT) Tabs Take 1,000 Units by mouth daily.  Allergies:  Allergies  Allergen Reactions   Percocet [Oxycodone-Acetaminophen] Other (See Comments)    SYNCOPE TACHYCARDIA   Shrimp [Shellfish Allergy] Anaphylaxis, Itching, Nausea And Vomiting and Swelling   Hydrocodone-Acetaminophen Nausea And Vomiting and Other (See Comments)    Dizziness, also   Lipitor [Atorvastatin] Other (See Comments)    Ear pain/fullness in ear "as if I were under water"   Saxenda [Liraglutide -Weight Management] Hives, Itching and Other (See Comments)    Hives/itching/site reaction   Adhesive [Tape] Rash and Other (See Comments)    EKG leads and certain bandages break out the skin   Belviq Xr [Lorcaserin  Hcl Er] Other (See Comments)    Gained weight    Ferrous Sulfate Nausea Only   Morphine And Related Nausea And Vomiting    Past Medical History, Surgical history, Social history, and Family History were reviewed and updated.  Review of Systems: All other 10 point review of systems is negative.   Physical Exam:  weight is 222 lb 12.8 oz (101.1 kg). Her oral temperature is 98.1 F (36.7 C). Her blood pressure is 126/66 and her pulse is 73. Her respiration is 18 and oxygen saturation is 97%.   Wt Readings from Last 3 Encounters:  03/11/22 222 lb 12.8 oz (101.1 kg)  02/18/22 218 lb (98.9 kg)  01/16/22 224 lb 12.8 oz (102 kg)    Ocular: Sclerae unicteric, pupils equal, round and reactive to light Ear-nose-throat: Oropharynx clear, dentition fair Lymphatic: No cervical or supraclavicular adenopathy Lungs no rales or rhonchi, good excursion bilaterally Heart regular rate and rhythm, no murmur appreciated Abd soft, nontender, positive bowel sounds MSK no focal spinal tenderness, no joint edema Neuro: non-focal, well-oriented, appropriate affect Breasts: Deferred   Lab Results  Component Value Date   WBC 9.1 03/11/2022   HGB 12.7 03/11/2022   HCT 43.6 03/11/2022   MCV 72.3 (L) 03/11/2022   PLT 560 (H) 03/11/2022   Lab Results  Component Value Date   FERRITIN 18 11/13/2021   IRON 66 11/13/2021   TIBC 503 (H) 11/13/2021   IRONPCTSAT 13 (L) 11/13/2021   Lab Results  Component Value Date   RETICCTPCT 2.6 12/17/2021   RBC 6.03 (H) 03/11/2022   No results found for: "KPAFRELGTCHN", "LAMBDASER", "KAPLAMBRATIO" No results found for: "IGGSERUM", "IGA", "IGMSERUM" No results found for: "TOTALPROTELP", "ALBUMINELP", "A1GS", "A2GS", "BETS", "BETA2SER", "GAMS", "MSPIKE", "SPEI"   Chemistry      Component Value Date/Time   NA 141 03/11/2022 1038   K 4.5 03/11/2022 1038   CL 106 03/11/2022 1038   CO2 28 03/11/2022 1038   BUN 13 03/11/2022 1038   CREATININE 1.22 (H) 03/11/2022  1038   CREATININE 1.18 (H) 12/11/2021 1221      Component Value Date/Time   CALCIUM 10.5 (H) 03/11/2022 1038   ALKPHOS 73 03/11/2022 1038   AST 24 03/11/2022 1038   ALT 22 03/11/2022 1038   BILITOT 0.6 03/11/2022 1038       Impression and Plan: [name] is a very pleasant 66 yo caucasian female with polycythemia, JAK 2 positive. Hct stable at 43.6%. No phlebotomy needed at this time.  Platelets 560 and WBC count 9.1.  Lab check and phlebotomy monthly.  Follow-up in 2 months.   Lottie Dawson, NP 8/2/202311:14 AM

## 2022-03-16 ENCOUNTER — Encounter: Payer: Self-pay | Admitting: Physician Assistant

## 2022-03-16 DIAGNOSIS — R051 Acute cough: Secondary | ICD-10-CM

## 2022-03-17 ENCOUNTER — Encounter: Payer: Self-pay | Admitting: Physician Assistant

## 2022-03-17 DIAGNOSIS — J189 Pneumonia, unspecified organism: Secondary | ICD-10-CM

## 2022-03-17 NOTE — Telephone Encounter (Signed)

## 2022-03-18 ENCOUNTER — Ambulatory Visit (INDEPENDENT_AMBULATORY_CARE_PROVIDER_SITE_OTHER): Payer: Medicare Other

## 2022-03-18 ENCOUNTER — Ambulatory Visit (HOSPITAL_BASED_OUTPATIENT_CLINIC_OR_DEPARTMENT_OTHER)
Admission: RE | Admit: 2022-03-18 | Discharge: 2022-03-18 | Disposition: A | Discharge status: Home / Self Care | Payer: Medicare Other | Source: Ambulatory Visit | Attending: Physician Assistant | Admitting: Physician Assistant

## 2022-03-18 DIAGNOSIS — J189 Pneumonia, unspecified organism: Secondary | ICD-10-CM

## 2022-03-18 MED ORDER — IOHEXOL 300 MG/ML  SOLN
75.0000 mL | Freq: Once | INTRAMUSCULAR | Status: AC | PRN
  Administered 2022-03-18: 75 mL via INTRAVENOUS

## 2022-03-18 NOTE — Progress Notes (Signed)
Persistent opacity over the left upper lobe. CT of chest ordered and should be calling soon.

## 2022-03-18 NOTE — Progress Notes (Unsigned)
CXR continues to show left upper lobe opacity. CT of chest ordered.

## 2022-03-19 ENCOUNTER — Encounter: Payer: Self-pay | Admitting: Physician Assistant

## 2022-03-19 DIAGNOSIS — J471 Bronchiectasis with (acute) exacerbation: Secondary | ICD-10-CM

## 2022-03-19 NOTE — Progress Notes (Signed)
Good news is not mass or cavitary pneumonia. Showing bronchiectasis and atelectasis.   When was your last steroid finished?  Any fever?  Symptoms productive cough which is colored and some SOB?  You did get some better on levaquin?

## 2022-03-20 ENCOUNTER — Encounter: Payer: Self-pay | Admitting: Physician Assistant

## 2022-03-20 DIAGNOSIS — J471 Bronchiectasis with (acute) exacerbation: Secondary | ICD-10-CM | POA: Insufficient documentation

## 2022-03-26 ENCOUNTER — Ambulatory Visit (INDEPENDENT_AMBULATORY_CARE_PROVIDER_SITE_OTHER): Payer: Medicare Other | Admitting: Pulmonary Disease

## 2022-03-26 ENCOUNTER — Encounter: Payer: Self-pay | Admitting: Pulmonary Disease

## 2022-03-26 VITALS — BP 138/70 | HR 69 | Ht 64.5 in | Wt 223.0 lb

## 2022-03-26 DIAGNOSIS — J479 Bronchiectasis, uncomplicated: Secondary | ICD-10-CM | POA: Diagnosis not present

## 2022-03-26 DIAGNOSIS — D45 Polycythemia vera: Secondary | ICD-10-CM

## 2022-03-26 DIAGNOSIS — R062 Wheezing: Secondary | ICD-10-CM | POA: Diagnosis not present

## 2022-03-26 DIAGNOSIS — J189 Pneumonia, unspecified organism: Secondary | ICD-10-CM

## 2022-03-26 MED ORDER — TRELEGY ELLIPTA 100-62.5-25 MCG/ACT IN AEPB: 1.0000 | INHALATION_SPRAY | Freq: Every day | RESPIRATORY_TRACT | 0 refills | Status: DC

## 2022-03-26 MED ORDER — BREZTRI AEROSPHERE 160-9-4.8 MCG/ACT IN AERO: 2.0000 | INHALATION_SPRAY | Freq: Two times a day (BID) | RESPIRATORY_TRACT | 0 refills | Status: DC

## 2022-03-26 NOTE — Patient Instructions (Addendum)
Thank you for visiting Dr. Valeta Harms at [facility]. Today we recommend the following:  Orders Placed This Encounter  Procedures   CT Chest Wo Contrast   I will call you with CT results.  Breztri samples today   Return if symptoms worsen or fail to improve.    Please do your part to reduce the spread of COVID-19.

## 2022-03-26 NOTE — Progress Notes (Signed)
Synopsis: Referred in August 2023 for recurrent pneumonia by Donella Stade, PA-C  Subjective:   PATIENT ID: [name] GENDER: female DOB: 1956-01-15, MRN: [id number]  Chief Complaint  Patient presents with   Consult    Consult: Pneumonia     This is a 66 year old female, past medical history of hypertension, hyperlipidemia, migraines, sleep apnea on CPAP recent diagnosis of polycythemia vera.  A few weeks ago presented with cough shortness of breath sputum production was diagnosed with left upper lobe pneumonia was treated with antibiotics.  Over the past couple of weeks her cough has slowly been improving she has been using Mucinex to help with sputum production and clearance.  Patient is still having issues with cough shortness of breath chest tightness and wheezing.      Past Medical History:  Diagnosis Date   Acute medial meniscal tear    Right knee   Allergy    Anxiety    Aortic insufficiency    Mild   Aortic regurgitation    Arthritis    Carpal tunnel syndrome    Chronic kidney disease    one kidney    Dysrhythmia    pvc's at times   GERD (gastroesophageal reflux disease)    Hyperlipidemia    Hypothyroidism    Migraine headache    Mitral regurgitation    Mitral valve prolapse    Nausea alone    Need for SBE (subacute bacterial endocarditis) prophylaxis    Pilonidal cyst without mention of abscess    Pneumonia Aug 14 2012   PONV (postoperative nausea and vomiting)    states she vasovagals   Sleep apnea    wears CPAP   Thyroid disease    Transplanted kidney removed    donated kidney     Family History  Problem Relation Age of Onset   Diabetes Mother    Hypertension Mother    Heart disease Father    Hypertension Father    Thyroid disease Father    AAA (abdominal aortic aneurysm) Father    Cancer Maternal Aunt    Cancer Maternal Aunt    Diabetes Sister    Fibromyalgia Sister    Obstructive Sleep Apnea Sister    Kidney disease Daughter     Heart disease Son    Colon cancer Neg Hx    Esophageal cancer Neg Hx    Rectal cancer Neg Hx    Stomach cancer Neg Hx      Past Surgical History:  Procedure Laterality Date   ABDOMINAL HYSTERECTOMY     ANTERIOR CERVICAL DECOMP/DISCECTOMY FUSION  05/28/2020   CARPAL TUNNEL RELEASE Right    CARPAL TUNNEL RELEASE Left 02/25/2017   Procedure: LEFT OPEN CARPAL TUNNEL RELEASE;  Surgeon: [name], MD;  Location: [facility];  Service: Orthopedics;  Laterality: Left;   CARPAL TUNNEL RELEASE Left 1998   CARPAL TUNNEL RELEASE Right 2019   COLONOSCOPY     KNEE ARTHROSCOPY Right 01/06/2018   Procedure: RIGHT KNEE ARTHROSCOPY WITH PARTIAL  MEDIAL MENISCECTOMY;  Surgeon: [name], MD;  Location: [place];  Service: Orthopedics;  Laterality: Right;   KNEE SURGERY     LARYNGOSCOPY N/A 05/30/2020   Procedure: LARYNGOSCOPY;  Surgeon: Leta Baptist, MD;  Location: [place];  Service: ENT;  Laterality: N/A;   NEPHRECTOMY     PARTIAL KNEE ARTHROPLASTY Right 03/31/2018   Procedure: RIGHT KNEE UNI ARTHROPLASTY;  Surgeon: Marchia Bond, MD;  Location: [place];  Service: Orthopedics;  Laterality: Right;   TONSILLECTOMY     TOTAL KNEE ARTHROPLASTY Left 09/14/2013   Procedure: LEFT TOTAL KNEE ARTHROPLASTY;  Surgeon: [name], MD;  Location: [place];  Service: Orthopedics;  Laterality: Left;   TUBAL LIGATION     WISDOM TOOTH EXTRACTION     WOUND EXPLORATION N/A 05/28/2020   Procedure: ANTERIOR CERVICAL WOUND EXPLORATION;  Surgeon: Erline Levine, MD;  Location: [facility];  Service: Neurosurgery;  Laterality: N/A;    Social History   Socioeconomic History   Marital status: Married    Spouse name: Tom   Number of children: 7   Years of education: 18   Highest education level: Conservator, museum/gallery (e.g., MA, MS, MEng, MEd, MSW, MBA)  Occupational History    Employer: [employer]  Tobacco Use   Smoking status: Former    Types: Cigarettes    Quit date: 11/22/1983    Years  since quitting: 38.3   Smokeless tobacco: Never  Vaping Use   Vaping Use: Never used  Substance and Sexual Activity   Alcohol use: Yes    Comment: Rarely   Drug use: No   Sexual activity: Yes    Partners: Male  Other Topics Concern   Not on file  Social History Narrative   Marital Status:  Married (Tom)Children: 7Pets:  Dogs (2)Living Situation: Lives with spouse, son and his family.  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[employer])  Education:  Forensic psychologist; MSN Tobacco Use/Exposure: NoneAlcohol Use: RarelyDrug Use:  NoneDiet:  Low Fat Exercise:  Limited (Walking Dogs) Hobbies:  Reading, Geneticist, molecular    Social Determinants of Health   Financial Resource Strain: Low Risk  (03/04/2021)   Overall Financial Resource Strain (CARDIA)    Difficulty of Paying Living Expenses: Not hard at all  Food Insecurity: No Food Insecurity (03/04/2021)   Hunger Vital Sign    Worried About Running Out of Food in the Last Year: Never true    [place] in the Last Year: Never true  Transportation Needs: No Transportation Needs (03/04/2021)   PRAPARE - Hydrologist (Medical): No    Lack of Transportation (Non-Medical): No  Physical Activity: Insufficiently Active (03/04/2021)   Exercise Vital Sign    Days of Exercise per Week: 2 days    Minutes of Exercise per Session: 50 min  Stress: No Stress Concern Present (03/04/2021)   [place]    Feeling of Stress : Not at all  Social Connections: [place] (03/04/2021)   Social Connection and Isolation Panel [NHANES]    Frequency of Communication with Friends and Family: More than three times a week    Frequency of Social Gatherings with Friends and Family: Three times a week    Attends Religious Services: 1 to 4 times per year    Active Member of Clubs or Organizations: Yes    Attends Archivist Meetings: More than 4 times per year     Marital Status: Married  Human resources officer Violence: Not At Risk (03/04/2021)   Humiliation, Afraid, Rape, and Kick questionnaire    Fear of Current or Ex-Partner: No    Emotionally Abused: No    Physically Abused: No    Sexually Abused: No     Allergies  Allergen Reactions   Percocet [Oxycodone-Acetaminophen] Other (See Comments)    SYNCOPE TACHYCARDIA   Shrimp [Shellfish Allergy] Anaphylaxis, Itching, Nausea And Vomiting and Swelling   Hydrocodone-Acetaminophen Nausea And  Vomiting and Other (See Comments)    Dizziness, also   Lipitor [Atorvastatin] Other (See Comments)    Ear pain/fullness in ear "as if I were under water"   Saxenda [Liraglutide -Weight Management] Hives, Itching and Other (See Comments)    Hives/itching/site reaction   Adhesive [Tape] Rash and Other (See Comments)    EKG leads and certain bandages break out the skin   Belviq Xr [Lorcaserin Hcl Er] Other (See Comments)    Gained weight    Ferrous Sulfate Nausea Only   Morphine And Related Nausea And Vomiting     Outpatient Medications Prior to Visit  Medication Sig Dispense Refill   albuterol (VENTOLIN HFA) 108 (90 Base) MCG/ACT inhaler Inhale 2 puffs into the lungs every 6 (six) hours as needed for wheezing or shortness of breath. 1 each 5   acetaminophen (TYLENOL) 325 MG tablet Take 325-650 mg by mouth every 6 (six) hours as needed for mild pain or headache.     Aspirin 81 MG CAPS Take 81 mg by mouth daily at 6 (six) AM.     calcium carbonate (TUMS - DOSED IN MG ELEMENTAL CALCIUM) 500 MG chewable tablet Chew 1 tablet by mouth as needed for indigestion or heartburn.     Carboxymethylcellul-Glycerin (LUBRICATING EYE DROPS OP) Place 1 drop into both eyes as needed (dry eyes).      Cholecalciferol (VITAMIN D3) 3000 units TABS Take 1,000 Units by mouth daily.      citalopram (CELEXA) 20 MG tablet TAKE 1/2 TABLET ONCE A DAY 45 tablet 3   Cyanocobalamin (B-12 PO) Take 1 tablet by mouth daily.      levothyroxine  (SYNTHROID) 125 MCG tablet Take 1 tablet (125 mcg total) by mouth daily before breakfast. 90 tablet 1   lisinopril (ZESTRIL) 5 MG tablet Take 1 tablet (5 mg total) by mouth daily. 90 tablet 0   pantoprazole (PROTONIX) 40 MG tablet TAKE 1 TABLET BY MOUTH EVERY DAY 90 tablet 0   promethazine (PHENERGAN) 25 MG tablet Take 1 tablet by mouth as needed.     rosuvastatin (CRESTOR) 10 MG tablet Take 1 tablet (10 mg total) by mouth daily. 90 tablet 3   No facility-administered medications prior to visit.    Review of Systems  Constitutional:  Negative for chills, fever, malaise/fatigue and weight loss.  HENT:  Negative for hearing loss, sore throat and tinnitus.   Eyes:  Negative for blurred vision and double vision.  Respiratory:  Positive for cough, shortness of breath and wheezing. Negative for hemoptysis, sputum production and stridor.   Cardiovascular:  Negative for chest pain, palpitations, orthopnea, leg swelling and PND.  Gastrointestinal:  Negative for abdominal pain, constipation, diarrhea, heartburn, nausea and vomiting.  Genitourinary:  Negative for dysuria, hematuria and urgency.  Musculoskeletal:  Negative for joint pain and myalgias.  Skin:  Negative for itching and rash.  Neurological:  Negative for dizziness, tingling, weakness and headaches.  Endo/Heme/Allergies:  Negative for environmental allergies. Does not bruise/bleed easily.  Psychiatric/Behavioral:  Negative for depression. The patient is not nervous/anxious and does not have insomnia.   All other systems reviewed and are negative.    Objective:  Physical Exam Vitals reviewed.  Constitutional:      General: She is not in acute distress.    Appearance: She is well-developed. She is obese.  HENT:     Head: Normocephalic and atraumatic.  Eyes:     General: No scleral icterus.    Conjunctiva/sclera: Conjunctivae normal.  Pupils: Pupils are equal, round, and reactive to light.  Neck:     Vascular: No JVD.      Trachea: No tracheal deviation.  Cardiovascular:     Rate and Rhythm: Normal rate and regular rhythm.     Heart sounds: Normal heart sounds. No murmur heard. Pulmonary:     Effort: Pulmonary effort is normal. No tachypnea, accessory muscle usage or respiratory distress.     Breath sounds: No stridor. Wheezing and rhonchi present. No rales.  Abdominal:     General: There is no distension.     Palpations: Abdomen is soft.     Tenderness: There is no abdominal tenderness.  Musculoskeletal:        General: No tenderness.     Cervical back: Neck supple.  Lymphadenopathy:     Cervical: No cervical adenopathy.  Skin:    General: Skin is warm and dry.     Capillary Refill: Capillary refill takes less than 2 seconds.     Findings: No rash.  Neurological:     Mental Status: She is alert and oriented to person, place, and time.  Psychiatric:        Behavior: Behavior normal.      Vitals:   03/26/22 1335  BP: 138/70  Pulse: 69  SpO2: 95%  Weight: 223 lb (101.2 kg)  Height: 5' 4.5" (1.638 m)   95% on RA BMI Readings from Last 3 Encounters:  03/26/22 37.69 kg/m  03/11/22 37.65 kg/m  02/18/22 36.84 kg/m   Wt Readings from Last 3 Encounters:  03/26/22 223 lb (101.2 kg)  03/11/22 222 lb 12.8 oz (101.1 kg)  02/18/22 218 lb (98.9 kg)     CBC    Component Value Date/Time   WBC 9.1 03/11/2022 1038   WBC 7.7 12/11/2021 1221   RBC 6.03 (H) 03/11/2022 1038   HGB 12.7 03/11/2022 1038   HCT 43.6 03/11/2022 1038   PLT 560 (H) 03/11/2022 1038   MCV 72.3 (L) 03/11/2022 1038   MCH 21.1 (L) 03/11/2022 1038   MCHC 29.1 (L) 03/11/2022 1038   RDW 17.9 (H) 03/11/2022 1038   LYMPHSABS 1.7 03/11/2022 1038   MONOABS 0.6 03/11/2022 1038   EOSABS 0.7 (H) 03/11/2022 1038   BASOSABS 0.1 03/11/2022 1038     Chest Imaging: CT chest 03/18/2022: Patient with few areas of cylindric bronchiectasis within the left upper lobe no visible obstructing mass.  Resolving pneumonia The patient's  images have been independently reviewed by me.    Pulmonary Functions Testing Results:     No data to display          FeNO:   Pathology:   Echocardiogram:   Heart Catheterization:     Assessment & Plan:     ICD-10-CM   1. Pneumonia of left upper lobe due to infectious organism  J18.9 CT Chest Wo Contrast    2. Polycythemia vera ([place])  D45     3. Bronchiectasis without complication ([place])  P54.6     4. Wheezing  R06.2       Discussion:  This is a 66 year old female, recent diagnosis of polycythemia vera, aspirin plus phlebotomy.  Had a recent left upper lobe pneumonia treated with antibiotics still with ongoing cough shortness of breath chest tightness wheezing.  This has been about 6 weeks since her diagnosis.  Plan: Recent CT chest reviewed with patient today in the office does appear that she has a resolving area of pneumonia in the left side no visible  endobronchial lesion. We will give her a trial of Breztri inhaler samples to see if this helps with the chest tightness and wheezing. Continue Mucinex and albuterol as needed. Repeat CT chest in 2 months, mid October to ensure clearance and resolution of the abnormality seen in the left upper lobe. We will call patient with results at that time.    Current Outpatient Medications:    albuterol (VENTOLIN HFA) 108 (90 Base) MCG/ACT inhaler, Inhale 2 puffs into the lungs every 6 (six) hours as needed for wheezing or shortness of breath., Disp: 1 each, Rfl: 5   acetaminophen (TYLENOL) 325 MG tablet, Take 325-650 mg by mouth every 6 (six) hours as needed for mild pain or headache., Disp: , Rfl:    Aspirin 81 MG CAPS, Take 81 mg by mouth daily at 6 (six) AM., Disp: , Rfl:    calcium carbonate (TUMS - DOSED IN MG ELEMENTAL CALCIUM) 500 MG chewable tablet, Chew 1 tablet by mouth as needed for indigestion or heartburn., Disp: , Rfl:    Carboxymethylcellul-Glycerin (LUBRICATING EYE DROPS OP), Place 1 drop into both eyes as  needed (dry eyes). , Disp: , Rfl:    Cholecalciferol (VITAMIN D3) 3000 units TABS, Take 1,000 Units by mouth daily. , Disp: , Rfl:    citalopram (CELEXA) 20 MG tablet, TAKE 1/2 TABLET ONCE A DAY, Disp: 45 tablet, Rfl: 3   Cyanocobalamin (B-12 PO), Take 1 tablet by mouth daily. , Disp: , Rfl:    levothyroxine (SYNTHROID) 125 MCG tablet, Take 1 tablet (125 mcg total) by mouth daily before breakfast., Disp: 90 tablet, Rfl: 1   lisinopril (ZESTRIL) 5 MG tablet, Take 1 tablet (5 mg total) by mouth daily., Disp: 90 tablet, Rfl: 0   pantoprazole (PROTONIX) 40 MG tablet, TAKE 1 TABLET BY MOUTH EVERY DAY, Disp: 90 tablet, Rfl: 0   promethazine (PHENERGAN) 25 MG tablet, Take 1 tablet by mouth as needed., Disp: , Rfl:    rosuvastatin (CRESTOR) 10 MG tablet, Take 1 tablet (10 mg total) by mouth daily., Disp: 90 tablet, Rfl: 3   Garner Nash, DO [facility] 03/26/2022 1:57 PM

## 2022-03-26 NOTE — Addendum Note (Signed)
Addended by: Alvin Critchley on: 03/26/2022 03:49 PM   Modules accepted: Orders

## 2022-03-31 ENCOUNTER — Other Ambulatory Visit: Payer: Self-pay | Admitting: Physician Assistant

## 2022-03-31 ENCOUNTER — Encounter: Payer: Self-pay | Admitting: Family

## 2022-03-31 DIAGNOSIS — I10 Essential (primary) hypertension: Secondary | ICD-10-CM

## 2022-03-31 DIAGNOSIS — K2100 Gastro-esophageal reflux disease with esophagitis, without bleeding: Secondary | ICD-10-CM

## 2022-04-01 ENCOUNTER — Encounter: Payer: Self-pay | Admitting: General Practice

## 2022-04-06 ENCOUNTER — Other Ambulatory Visit: Payer: Self-pay | Admitting: *Deleted

## 2022-04-06 ENCOUNTER — Inpatient Hospital Stay: Payer: Medicare Other

## 2022-04-06 DIAGNOSIS — D751 Secondary polycythemia: Secondary | ICD-10-CM

## 2022-04-06 DIAGNOSIS — D45 Polycythemia vera: Secondary | ICD-10-CM | POA: Diagnosis not present

## 2022-04-06 LAB — CBC WITH DIFFERENTIAL (CANCER CENTER ONLY)
Abs Immature Granulocytes: 0.1 10*3/uL — ABNORMAL HIGH (ref 0.00–0.07)
Basophils Absolute: 0.1 10*3/uL (ref 0.0–0.1)
Basophils Relative: 1 %
Eosinophils Absolute: 0.7 10*3/uL — ABNORMAL HIGH (ref 0.0–0.5)
Eosinophils Relative: 6 %
HCT: 43.3 % (ref 36.0–46.0)
Hemoglobin: 12.4 g/dL (ref 12.0–15.0)
Immature Granulocytes: 1 %
Lymphocytes Relative: 16 %
Lymphs Abs: 1.7 10*3/uL (ref 0.7–4.0)
MCH: 19.8 pg — ABNORMAL LOW (ref 26.0–34.0)
MCHC: 28.6 g/dL — ABNORMAL LOW (ref 30.0–36.0)
MCV: 69.3 fL — ABNORMAL LOW (ref 80.0–100.0)
Monocytes Absolute: 0.5 10*3/uL (ref 0.1–1.0)
Monocytes Relative: 5 %
Neutro Abs: 7.5 10*3/uL (ref 1.7–7.7)
Neutrophils Relative %: 71 %
Platelet Count: 554 10*3/uL — ABNORMAL HIGH (ref 150–400)
RBC: 6.25 MIL/uL — ABNORMAL HIGH (ref 3.87–5.11)
RDW: 18.4 % — ABNORMAL HIGH (ref 11.5–15.5)
WBC Count: 10.6 10*3/uL — ABNORMAL HIGH (ref 4.0–10.5)
nRBC: 0 % (ref 0.0–0.2)

## 2022-04-14 ENCOUNTER — Inpatient Hospital Stay: Payer: Medicare Other

## 2022-04-21 ENCOUNTER — Telehealth: Payer: Self-pay | Admitting: Pulmonary Disease

## 2022-04-21 MED ORDER — BREZTRI AEROSPHERE 160-9-4.8 MCG/ACT IN AERO: 2.0000 | INHALATION_SPRAY | Freq: Two times a day (BID) | RESPIRATORY_TRACT | 5 refills | Status: DC

## 2022-04-21 MED ORDER — ALBUTEROL SULFATE HFA 108 (90 BASE) MCG/ACT IN AERS: 2.0000 | INHALATION_SPRAY | Freq: Four times a day (QID) | RESPIRATORY_TRACT | 5 refills | Status: DC | PRN

## 2022-04-21 NOTE — Telephone Encounter (Signed)
PCCM:  Patient contacted me needing refills of inhalers  Garner Nash, DO [facility] 04/21/2022 7:40 AM

## 2022-04-30 ENCOUNTER — Other Ambulatory Visit: Payer: Self-pay | Admitting: Physician Assistant

## 2022-04-30 DIAGNOSIS — Z1231 Encounter for screening mammogram for malignant neoplasm of breast: Secondary | ICD-10-CM

## 2022-05-06 ENCOUNTER — Ambulatory Visit (INDEPENDENT_AMBULATORY_CARE_PROVIDER_SITE_OTHER): Payer: Medicare Other | Admitting: Physician Assistant

## 2022-05-06 ENCOUNTER — Encounter: Payer: Self-pay | Admitting: Physician Assistant

## 2022-05-06 VITALS — BP 117/54 | HR 80 | Temp 98.1°F | Ht 64.5 in | Wt 223.0 lb

## 2022-05-06 DIAGNOSIS — R110 Nausea: Secondary | ICD-10-CM

## 2022-05-06 DIAGNOSIS — Z9989 Dependence on other enabling machines and devices: Secondary | ICD-10-CM

## 2022-05-06 DIAGNOSIS — J471 Bronchiectasis with (acute) exacerbation: Secondary | ICD-10-CM | POA: Diagnosis not present

## 2022-05-06 DIAGNOSIS — J329 Chronic sinusitis, unspecified: Secondary | ICD-10-CM | POA: Diagnosis not present

## 2022-05-06 DIAGNOSIS — J40 Bronchitis, not specified as acute or chronic: Secondary | ICD-10-CM

## 2022-05-06 DIAGNOSIS — G4733 Obstructive sleep apnea (adult) (pediatric): Secondary | ICD-10-CM | POA: Diagnosis not present

## 2022-05-06 MED ORDER — AZITHROMYCIN 250 MG PO TABS: ORAL_TABLET | ORAL | 0 refills | Status: DC

## 2022-05-06 MED ORDER — IPRATROPIUM-ALBUTEROL 0.5-2.5 (3) MG/3ML IN SOLN
3.0000 mL | Freq: Once | RESPIRATORY_TRACT | Status: AC
  Administered 2022-05-06: 3 mL via RESPIRATORY_TRACT

## 2022-05-06 MED ORDER — PROMETHAZINE HCL 25 MG PO TABS: 25.0000 mg | ORAL_TABLET | ORAL | 1 refills | Status: DC | PRN

## 2022-05-06 MED ORDER — HYDROCODONE BIT-HOMATROP MBR 5-1.5 MG/5ML PO SOLN: 5.0000 mL | Freq: Three times a day (TID) | ORAL | 0 refills | Status: DC | PRN

## 2022-05-06 MED ORDER — PREDNISONE 20 MG PO TABS: ORAL_TABLET | ORAL | 0 refills | Status: DC

## 2022-05-06 MED ORDER — IPRATROPIUM-ALBUTEROL 0.5-2.5 (3) MG/3ML IN SOLN: 3.0000 mL | RESPIRATORY_TRACT | 0 refills | Status: DC | PRN

## 2022-05-06 NOTE — Patient Instructions (Addendum)
Start zpak and prednisone Use nebulizer to replace inhaler Hycodan for cough at night  Acute Bronchitis, Adult  Acute bronchitis is sudden inflammation of the main airways (bronchi) that come off the windpipe (trachea) in the lungs. The swelling causes the airways to get smaller and make more mucus than normal. This can make it hard to breathe and can cause coughing or noisy breathing (wheezing). Acute bronchitis may last several weeks. The cough may last longer. Allergies, asthma, and exposure to smoke may make the condition worse. What are the causes? This condition can be caused by germs and by substances that irritate the lungs, including: Cold and flu viruses. The most common cause of this condition is the virus that causes the common cold. Bacteria. This is less common. Breathing in substances that irritate the lungs, including: Smoke from cigarettes and other forms of tobacco. Dust and pollen. Fumes from household cleaning products, gases, or burned fuel. Indoor or outdoor air pollution. What increases the risk? The following factors may make you more likely to develop this condition: A weak body's defense system, also called the immune system. A condition that affects your lungs and breathing, such as asthma. What are the signs or symptoms? Common symptoms of this condition include: Coughing. This may bring up clear, yellow, or green mucus from your lungs (sputum). Wheezing. Runny or stuffy nose. Having too much mucus in your lungs (chest congestion). Shortness of breath. Aches and pains, including sore throat or chest. How is this diagnosed? This condition is usually diagnosed based on: Your symptoms and medical history. A physical exam. You may also have other tests, including tests to rule out other conditions, such as pneumonia. These tests include: A test of lung function. Test of a mucus sample to look for the presence of bacteria. Tests to check the oxygen level in  your blood. Blood tests. Chest X-ray. How is this treated? Most cases of acute bronchitis clear up over time without treatment. Your health care provider may recommend: Drinking more fluids to help thin your mucus so it is easier to cough up. Taking inhaled medicine (inhaler) to improve air flow in and out of your lungs. Using a vaporizer or a humidifier. These are machines that add water to the air to help you breathe better. Taking a medicine that thins mucus and clears congestion (expectorant). Taking a medicine that prevents or stops coughing (cough suppressant). It is not common to take an antibiotic medicine for this condition. Follow these instructions at home:  Take over-the-counter and prescription medicines only as told by your health care provider. Use an inhaler, vaporizer, or humidifier as told by your health care provider. Take two teaspoons (10 mL) of honey at bedtime to lessen coughing at night. Drink enough fluid to keep your urine pale yellow. Do not use any products that contain nicotine or tobacco. These products include cigarettes, chewing tobacco, and vaping devices, such as e-cigarettes. If you need help quitting, ask your health care provider. Get plenty of rest. Return to your normal activities as told by your health care provider. Ask your health care provider what activities are safe for you. Keep all follow-up visits. This is important. How is this prevented? To lower your risk of getting this condition again: Wash your hands often with soap and water for at least 20 seconds. If soap and water are not available, use hand sanitizer. Avoid contact with people who have cold symptoms. Try not to touch your mouth, nose, or eyes with your  hands. Avoid breathing in smoke or chemical fumes. Breathing smoke or chemical fumes will make your condition worse. Get the flu shot every year. Contact a health care provider if: Your symptoms do not improve after 2 weeks. You  have trouble coughing up the mucus. Your cough keeps you awake at night. You have a fever. Get help right away if you: Cough up blood. Feel pain in your chest. Have severe shortness of breath. Faint or keep feeling like you are going to faint. Have a severe headache. Have a fever or chills that get worse. These symptoms may represent a serious problem that is an emergency. Do not wait to see if the symptoms will go away. Get medical help right away. Call your local emergency services (911 in the [place] Do not drive yourself to the hospital. Summary Acute bronchitis is inflammation of the main airways (bronchi) that come off the windpipe (trachea) in the lungs. The swelling causes the airways to get smaller and make more mucus than normal. Drinking more fluids can help thin your mucus so it is easier to cough up. Take over-the-counter and prescription medicines only as told by your health care provider. Do not use any products that contain nicotine or tobacco. These products include cigarettes, chewing tobacco, and vaping devices, such as e-cigarettes. If you need help quitting, ask your health care provider. Contact a health care provider if your symptoms do not improve after 2 weeks. This information is not intended to replace advice given to you by your health care provider. Make sure you discuss any questions you have with your health care provider. Document Revised: 11/06/2021 Document Reviewed: 11/27/2020 Elsevier Patient Education  [place].

## 2022-05-06 NOTE — Progress Notes (Signed)
Acute Office Visit  Subjective:     Patient ID: [name], female    DOB: 10/06/1955, 66 y.o.   MRN: [id number]  Chief Complaint  Patient presents with   Sinus Problem    HPI Patient is in today for sinus pressure, productive cough, sinus congestion, headache, ear pressure and worsening shortness of breath for the last 6 days. Her husband is with her today. She does see pulmonology. She has upcoming CT of chest later this month. She was doing better and stopped Breztri. She then started coughing more and becoming SOB. She started [place] again. She now has lots of URI symptoms. Home covid negative. Using albuterol but not helping like it was. She feels like "she cannot get it in". No fever, chills, body aches.   Pt needs re certification for CPAP. She is doing great. Used for 5 years, every night, 6-8 hours. If she does not use it she has many symptoms of fatigue, headache, worsening breathing.     .. Active Ambulatory Problems    Diagnosis Date Noted   UTI (urinary tract infection) 07/14/2012   Other and unspecified hyperlipidemia 07/14/2012   MVP (mitral valve prolapse) 07/14/2012   GERD (gastroesophageal reflux disease) 07/14/2012   Allergic rhinitis 07/14/2012   Mitral regurgitation 07/14/2012   S/P hysterectomy 07/14/2012   Essential hypertension, benign 04/29/2013   Wheezing 04/29/2013   Insomnia 04/29/2013   Unspecified vitamin D deficiency 04/29/2013   Migraine headache 04/29/2013   Anxiety state 04/29/2013   Unspecified hypothyroidism 04/29/2013   Obesity (BMI 30-39.9) 07/17/2013   Cough 08/15/2013   Arthritis of left knee 09/14/2013   Status post total knee replacement 09/14/2013   Abdominal pain, chronic, right upper quadrant 11/16/2013   Candidiasis of female genitalia 11/16/2013   Elevated liver enzymes 64/33/2951   Folliculitis 88/41/6606   Nausea alone 11/18/2013   Abnormal transaminases 11/18/2013   Hyperlipidemia 11/22/2014   Single kidney  11/22/2014   Hypothyroidism 11/22/2014   OSA on CPAP 12/23/2015   Daytime sleepiness 01/24/2017   Carpal tunnel syndrome, left upper limb 02/25/2017   Status post carpal tunnel release 03/11/2017   Hypertriglyceridemia 07/14/2017   Elevated fasting glucose 07/14/2017   Chronic pain of right knee 09/13/2017   Unilateral primary osteoarthritis, right knee 09/27/2017   Pre-diabetes 10/19/2017   Pain in thumb joint with movement of right hand 10/19/2017   Other meniscus derangements, posterior horn of medial meniscus, right knee 11/24/2017   Pain of right thumb 11/24/2017   Trigger thumb, right thumb 11/24/2017   BPPV (benign paroxysmal positional vertigo), right 02/20/2018   Concussion wth loss of consciousness of 30 minutes or less 02/20/2018   Fall 02/20/2018   CKD (chronic kidney disease) stage 3, GFR 30-59 ml/min (HCC) 03/10/2019   BPPV (benign paroxysmal positional vertigo), left 05/22/2019   Pain radiating to back 05/23/2019   Elevated lipase 06/12/2019   Seborrheic keratoses 01/05/2020   Papules 01/05/2020   Class 2 obesity due to excess calories without serious comorbidity with body mass index (BMI) of 37.0 to 37.9 in adult 01/05/2020   Anxiety and depression 06/03/2014   Fracture of triquetral bone of wrist 02/21/2018   Primary osteoarthritis involving multiple joints 06/03/2014   Degenerative disc disease, cervical 04/11/2020   Right arm pain 04/16/2020   Right arm weakness 04/16/2020   Iron deficiency 04/17/2020   Airway compromise 05/28/2020   Laryngospasm 05/28/2020   Status post cervical spinal fusion 06/04/2020   SOB (shortness of breath) 06/04/2020  History of laryngeal spasm 06/04/2020   Atelectasis 06/04/2020   Frequent headaches 10/14/2020   Epidermal cyst 03/07/2021   Abnormal kidney function 03/10/2021   Iron deficiency anemia 03/10/2021   Asthmatic bronchitis 11/13/2021   Palpitations 12/09/2021   Polycythemia 12/09/2021   Sinobronchitis 02/20/2022    Bronchiectasis with acute exacerbation ([place]) 03/20/2022   Resolved Ambulatory Problems    Diagnosis Date Noted   Hypothyroidism 07/14/2012   Class 2 obesity due to excess calories without serious comorbidity with body mass index (BMI) of 35.0 to 35.9 in adult 10/19/2017   Past Medical History:  Diagnosis Date   Acute medial meniscal tear    Allergy    Anxiety    Aortic insufficiency    Aortic regurgitation    Arthritis    Carpal tunnel syndrome    Chronic kidney disease    Dysrhythmia    Mitral valve prolapse    Need for SBE (subacute bacterial endocarditis) prophylaxis    Pilonidal cyst without mention of abscess    Pneumonia Aug 14 2012   PONV (postoperative nausea and vomiting)    Sleep apnea    Thyroid disease    Transplanted kidney removed        ROS  See HPI.     Objective:    BP (!) 117/54   Pulse 80   Temp 98.1 F (36.7 C) (Oral)   Ht 5' 4.5" (1.638 m)   Wt 223 lb (101.2 kg)   SpO2 98%   BMI 37.69 kg/m  BP Readings from Last 3 Encounters:  05/06/22 (!) 117/54  03/26/22 138/70  03/11/22 126/66   Wt Readings from Last 3 Encounters:  05/06/22 223 lb (101.2 kg)  03/26/22 223 lb (101.2 kg)  03/11/22 222 lb 12.8 oz (101.1 kg)      Physical Exam Constitutional:      Appearance: Normal appearance. She is obese.  HENT:     Head: Normocephalic.     Right Ear: Tympanic membrane, ear canal and external ear normal. There is no impacted cerumen.     Left Ear: Tympanic membrane, ear canal and external ear normal. There is no impacted cerumen.     Nose: Congestion present.     Mouth/Throat:     Mouth: Mucous membranes are moist.     Pharynx: Posterior oropharyngeal erythema present.  Eyes:     Conjunctiva/sclera: Conjunctivae normal.  Neck:     Vascular: No carotid bruit.  Cardiovascular:     Rate and Rhythm: Normal rate and regular rhythm.     Pulses: Normal pulses.  Pulmonary:     Effort: Pulmonary effort is normal.  Musculoskeletal:      Cervical back: Normal range of motion.     Right lower leg: No edema.     Left lower leg: No edema.  Lymphadenopathy:     Cervical: No cervical adenopathy.  Neurological:     General: No focal deficit present.     Mental Status: She is alert and oriented to person, place, and time.  Psychiatric:        Mood and Affect: Mood normal.     Duoneb given in office with benefit of deeper breathing and less SOB.     Assessment & Plan:  Marland KitchenMarland KitchenChioma was seen today for sinus problem.  Diagnoses and all orders for this visit:  Sinobronchitis -     azithromycin (ZITHROMAX) 250 MG tablet; 2 tabs po on Day 1, then 1 tab daily Days 2 - 5 -  predniSONE (DELTASONE) 20 MG tablet; Take 3 tablets for 3 days, take 2 tablets for 3 days, take 1 tablet for 3 days, take 1/2 tablet for 4 days. -     HYDROcodone bit-homatropine (HYCODAN) 5-1.5 MG/5ML syrup; Take 5 mLs by mouth every 8 (eight) hours as needed for cough. -     ipratropium-albuterol (DUONEB) 0.5-2.5 (3) MG/3ML SOLN; Take 3 mLs by nebulization every 2 (two) hours as needed (wheeze, SOB). -     For home use only DME Nebulizer machine  Bronchiectasis with acute exacerbation (HCC) -     azithromycin (ZITHROMAX) 250 MG tablet; 2 tabs po on Day 1, then 1 tab daily Days 2 - 5 -     predniSONE (DELTASONE) 20 MG tablet; Take 3 tablets for 3 days, take 2 tablets for 3 days, take 1 tablet for 3 days, take 1/2 tablet for 4 days. -     HYDROcodone bit-homatropine (HYCODAN) 5-1.5 MG/5ML syrup; Take 5 mLs by mouth every 8 (eight) hours as needed for cough. -     ipratropium-albuterol (DUONEB) 0.5-2.5 (3) MG/3ML SOLN; Take 3 mLs by nebulization every 2 (two) hours as needed (wheeze, SOB). -     For home use only DME Nebulizer machine -     ipratropium-albuterol (DUONEB) 0.5-2.5 (3) MG/3ML nebulizer solution 3 mL  OSA on CPAP  Nausea -     promethazine (PHENERGAN) 25 MG tablet; Take 1 tablet (25 mg total) by mouth as needed.  Ongoing  bronchiectasis Continue to use incentive spironmeter regularly Do not stop Breztri Added nebulizer to help with inhalation-sent duoneb to pharmacy Start zpak and prednisone Hycodan given for cough at bedtime Continue follow up with pulmonology   Pt is doing great on CPAP. Stable for 5 years. Wearing every night for 6-8 hours. If she does not wear it she gets headaches and extremely fatigued.  Sent UPDATE to [facility]   Iran Planas, PA-C

## 2022-05-08 ENCOUNTER — Telehealth: Payer: Self-pay | Admitting: Neurology

## 2022-05-08 NOTE — Telephone Encounter (Signed)
Last office note and CPAP recertification order faxed to [facility] at [phone] with confirmation received.

## 2022-05-12 ENCOUNTER — Other Ambulatory Visit: Payer: Self-pay

## 2022-05-12 ENCOUNTER — Inpatient Hospital Stay: Payer: Medicare Other

## 2022-05-12 ENCOUNTER — Encounter: Payer: Self-pay | Admitting: Family

## 2022-05-12 ENCOUNTER — Inpatient Hospital Stay: Payer: Medicare Other | Attending: Hematology & Oncology | Admitting: Family

## 2022-05-12 VITALS — BP 142/59 | HR 80 | Temp 98.4°F | Resp 18 | Ht 64.5 in | Wt 228.4 lb

## 2022-05-12 DIAGNOSIS — D500 Iron deficiency anemia secondary to blood loss (chronic): Secondary | ICD-10-CM

## 2022-05-12 DIAGNOSIS — D45 Polycythemia vera: Secondary | ICD-10-CM | POA: Insufficient documentation

## 2022-05-12 DIAGNOSIS — D751 Secondary polycythemia: Secondary | ICD-10-CM

## 2022-05-12 LAB — CBC WITH DIFFERENTIAL (CANCER CENTER ONLY)
Abs Immature Granulocytes: 0.07 10*3/uL (ref 0.00–0.07)
Basophils Absolute: 0.1 10*3/uL (ref 0.0–0.1)
Basophils Relative: 1 %
Eosinophils Absolute: 0.8 10*3/uL — ABNORMAL HIGH (ref 0.0–0.5)
Eosinophils Relative: 5 %
HCT: 43.6 % (ref 36.0–46.0)
Hemoglobin: 12.5 g/dL (ref 12.0–15.0)
Immature Granulocytes: 0 %
Lymphocytes Relative: 12 %
Lymphs Abs: 1.9 10*3/uL (ref 0.7–4.0)
MCH: 18.8 pg — ABNORMAL LOW (ref 26.0–34.0)
MCHC: 28.7 g/dL — ABNORMAL LOW (ref 30.0–36.0)
MCV: 65.5 fL — ABNORMAL LOW (ref 80.0–100.0)
Monocytes Absolute: 0.7 10*3/uL (ref 0.1–1.0)
Monocytes Relative: 5 %
Neutro Abs: 12.1 10*3/uL — ABNORMAL HIGH (ref 1.7–7.7)
Neutrophils Relative %: 77 %
Platelet Count: 664 10*3/uL — ABNORMAL HIGH (ref 150–400)
RBC: 6.66 MIL/uL — ABNORMAL HIGH (ref 3.87–5.11)
RDW: 20.4 % — ABNORMAL HIGH (ref 11.5–15.5)
WBC Count: 15.6 10*3/uL — ABNORMAL HIGH (ref 4.0–10.5)
nRBC: 0.1 % (ref 0.0–0.2)

## 2022-05-12 LAB — CMP (CANCER CENTER ONLY)
ALT: 26 U/L (ref 0–44)
AST: 21 U/L (ref 15–41)
Albumin: 4.3 g/dL (ref 3.5–5.0)
Alkaline Phosphatase: 72 U/L (ref 38–126)
Anion gap: 8 (ref 5–15)
BUN: 26 mg/dL — ABNORMAL HIGH (ref 8–23)
CO2: 27 mmol/L (ref 22–32)
Calcium: 10 mg/dL (ref 8.9–10.3)
Chloride: 108 mmol/L (ref 98–111)
Creatinine: 1.48 mg/dL — ABNORMAL HIGH (ref 0.44–1.00)
GFR, Estimated: 39 mL/min — ABNORMAL LOW (ref >60)
Glucose, Bld: 140 mg/dL — ABNORMAL HIGH (ref 70–99)
Potassium: 4.2 mmol/L (ref 3.5–5.1)
Sodium: 143 mmol/L (ref 135–145)
Total Bilirubin: 0.8 mg/dL (ref 0.3–1.2)
Total Protein: 6.5 g/dL (ref 6.5–8.1)

## 2022-05-12 LAB — LACTATE DEHYDROGENASE: LDH: 243 U/L — ABNORMAL HIGH (ref 98–192)

## 2022-05-12 NOTE — Progress Notes (Signed)
Hematology and Oncology Follow Up Visit  [name] [id number] 12-13-1955 66 y.o. 05/12/2022   Principle Diagnosis:  Polycythemia vera, JAK 2 positive    Current Therapy:        Phlebotomy as indicated to maintain Hct < 45%   Interim History:  Ms. [name] is here today for follow-up. She is finally getting over a persistent bout with pneumonia that started in July.  She notes that the SOB is much better but she still has a productive cough. She has seen pulmonology and is currently on a prednisone taper. She will reduce her dose from 40 mg PO daily to 20 mg PO daily tomorrow.  She has a CT of the chest scheduled for 05/29/2022.  No fever, chills, n/v, rash, dizziness, chest pain, palpitations, abdominal pain or changes in bowel or bladder habits.  She has occasional tingling and numbness in her fingertips.  No swelling or tenderness in her extremities.  No falls or syncope reported.  Appetite and hydration have been good. Her weight is stable at 228 lbs.   ECOG Performance Status: 1 - Symptomatic but completely ambulatory  Medications:  Allergies as of 05/12/2022       Reactions   Percocet [oxycodone-acetaminophen] Other (See Comments)   SYNCOPE TACHYCARDIA   Shrimp [shellfish Allergy] Anaphylaxis, Itching, Nausea And Vomiting, Swelling   Hydrocodone-acetaminophen Nausea And Vomiting, Other (See Comments)   Dizziness, also   Lipitor [atorvastatin] Other (See Comments)   Ear pain/fullness in ear "as if I were under water"   Saxenda [liraglutide -weight Management] Hives, Itching, Other (See Comments)   Hives/itching/site reaction   Adhesive [tape] Rash, Other (See Comments)   EKG leads and certain bandages break out the skin   Belviq Xr [lorcaserin Hcl Er] Other (See Comments)   Gained weight    Ferrous Sulfate Nausea Only   Morphine And Related Nausea And Vomiting        Medication List        Accurate as of May 12, 2022  1:45 PM. If you have any questions,  ask your nurse or doctor.          acetaminophen 325 MG tablet Commonly known as: TYLENOL Take 325-650 mg by mouth every 6 (six) hours as needed for mild pain or headache.   albuterol 108 (90 Base) MCG/ACT inhaler Commonly known as: VENTOLIN HFA Inhale 2 puffs into the lungs every 6 (six) hours as needed for wheezing or shortness of breath.   Aspirin 81 MG Caps Take 81 mg by mouth daily at 6 (six) AM.   azithromycin 250 MG tablet Commonly known as: Zithromax 2 tabs po on Day 1, then 1 tab daily Days 2 - 5   B-12 PO Take 1 tablet by mouth daily.   Breztri Aerosphere 160-9-4.8 MCG/ACT Aero Generic drug: Budeson-Glycopyrrol-Formoterol Inhale 2 puffs into the lungs in the morning and at bedtime.   calcium carbonate 500 MG chewable tablet Commonly known as: TUMS - dosed in mg elemental calcium Chew 1 tablet by mouth as needed for indigestion or heartburn.   citalopram 20 MG tablet Commonly known as: CELEXA TAKE 1/2 TABLET ONCE A DAY   HYDROcodone bit-homatropine 5-1.5 MG/5ML syrup Commonly known as: HYCODAN Take 5 mLs by mouth every 8 (eight) hours as needed for cough.   ipratropium-albuterol 0.5-2.5 (3) MG/3ML Soln Commonly known as: DUONEB Take 3 mLs by nebulization every 2 (two) hours as needed (wheeze, SOB).   levothyroxine 125 MCG tablet Commonly known as: SYNTHROID Take  1 tablet (125 mcg total) by mouth daily before breakfast.   lisinopril 5 MG tablet Commonly known as: ZESTRIL Take 1 tablet (5 mg total) by mouth daily.   LUBRICATING EYE DROPS OP Place 1 drop into both eyes as needed (dry eyes).   pantoprazole 40 MG tablet Commonly known as: PROTONIX TAKE 1 TABLET BY MOUTH EVERY DAY   predniSONE 20 MG tablet Commonly known as: DELTASONE Take 3 tablets for 3 days, take 2 tablets for 3 days, take 1 tablet for 3 days, take 1/2 tablet for 4 days.   promethazine 25 MG tablet Commonly known as: PHENERGAN Take 1 tablet (25 mg total) by mouth as needed.    rosuvastatin 10 MG tablet Commonly known as: CRESTOR Take 1 tablet (10 mg total) by mouth daily.   Vitamin D3 75 MCG (3000 UT) Tabs Take 1,000 Units by mouth daily.        Allergies:  Allergies  Allergen Reactions   Percocet [Oxycodone-Acetaminophen] Other (See Comments)    SYNCOPE TACHYCARDIA   Shrimp [Shellfish Allergy] Anaphylaxis, Itching, Nausea And Vomiting and Swelling   Hydrocodone-Acetaminophen Nausea And Vomiting and Other (See Comments)    Dizziness, also   Lipitor [Atorvastatin] Other (See Comments)    Ear pain/fullness in ear "as if I were under water"   Saxenda [Liraglutide -Weight Management] Hives, Itching and Other (See Comments)    Hives/itching/site reaction   Adhesive [Tape] Rash and Other (See Comments)    EKG leads and certain bandages break out the skin   Belviq Xr [Lorcaserin Hcl Er] Other (See Comments)    Gained weight    Ferrous Sulfate Nausea Only   Morphine And Related Nausea And Vomiting    Past Medical History, Surgical history, Social history, and Family History were reviewed and updated.  Review of Systems: All other 10 point review of systems is negative.   Physical Exam:  vitals were not taken for this visit.   Wt Readings from Last 3 Encounters:  05/06/22 223 lb (101.2 kg)  03/26/22 223 lb (101.2 kg)  03/11/22 222 lb 12.8 oz (101.1 kg)    Ocular: Sclerae unicteric, pupils equal, round and reactive to light Ear-nose-throat: Oropharynx clear, dentition fair Lymphatic: No cervical or supraclavicular adenopathy Lungs no rales or rhonchi, good excursion bilaterally Heart regular rate and rhythm, no murmur appreciated Abd soft, nontender, positive bowel sounds MSK no focal spinal tenderness, no joint edema Neuro: non-focal, well-oriented, appropriate affect Breasts: Deferred   Lab Results  Component Value Date   WBC 15.6 (H) 05/12/2022   HGB 12.5 05/12/2022   HCT 43.6 05/12/2022   MCV 65.5 (L) 05/12/2022   PLT 664 (H)  05/12/2022   Lab Results  Component Value Date   FERRITIN 18 11/13/2021   IRON 66 11/13/2021   TIBC 503 (H) 11/13/2021   IRONPCTSAT 13 (L) 11/13/2021   Lab Results  Component Value Date   RETICCTPCT 2.6 12/17/2021   RBC 6.66 (H) 05/12/2022   No results found for: "KPAFRELGTCHN", "LAMBDASER", "KAPLAMBRATIO" No results found for: "IGGSERUM", "IGA", "IGMSERUM" No results found for: "TOTALPROTELP", "ALBUMINELP", "A1GS", "A2GS", "BETS", "BETA2SER", "GAMS", "MSPIKE", "SPEI"   Chemistry      Component Value Date/Time   NA 141 03/11/2022 1038   K 4.5 03/11/2022 1038   CL 106 03/11/2022 1038   CO2 28 03/11/2022 1038   BUN 13 03/11/2022 1038   CREATININE 1.22 (H) 03/11/2022 1038   CREATININE 1.18 (H) 12/11/2021 1221      Component Value Date/Time  CALCIUM 10.5 (H) 03/11/2022 1038   ALKPHOS 73 03/11/2022 1038   AST 24 03/11/2022 1038   ALT 22 03/11/2022 1038   BILITOT 0.6 03/11/2022 1038       Impression and Plan: Ms. Bensen is a very pleasant 66 yo caucasian female with polycythemia, JAK 2 positive. No phlebotomy needed at this time. Hct 43.6%.  Lab check and phlebotomy monthly.  Follow-up with MD in 2 months.   Lottie Dawson, NP 10/3/20231:45 PM

## 2022-05-15 ENCOUNTER — Other Ambulatory Visit: Payer: Self-pay | Admitting: Physician Assistant

## 2022-05-15 DIAGNOSIS — I10 Essential (primary) hypertension: Secondary | ICD-10-CM

## 2022-05-28 ENCOUNTER — Ambulatory Visit: Payer: Medicare Other

## 2022-05-29 ENCOUNTER — Ambulatory Visit (HOSPITAL_BASED_OUTPATIENT_CLINIC_OR_DEPARTMENT_OTHER)
Admission: RE | Admit: 2022-05-29 | Discharge: 2022-05-29 | Disposition: A | Discharge status: Home / Self Care | Payer: Medicare Other | Source: Ambulatory Visit | Attending: Pulmonary Disease | Admitting: Pulmonary Disease

## 2022-05-29 DIAGNOSIS — J189 Pneumonia, unspecified organism: Secondary | ICD-10-CM | POA: Diagnosis present

## 2022-06-02 ENCOUNTER — Other Ambulatory Visit: Payer: Self-pay | Admitting: Physician Assistant

## 2022-06-02 DIAGNOSIS — J329 Chronic sinusitis, unspecified: Secondary | ICD-10-CM

## 2022-06-02 DIAGNOSIS — J471 Bronchiectasis with (acute) exacerbation: Secondary | ICD-10-CM

## 2022-06-05 ENCOUNTER — Ambulatory Visit (INDEPENDENT_AMBULATORY_CARE_PROVIDER_SITE_OTHER): Payer: Medicare Other | Admitting: Physician Assistant

## 2022-06-05 ENCOUNTER — Encounter: Payer: Self-pay | Admitting: Physician Assistant

## 2022-06-05 VITALS — BP 119/52 | HR 54 | Ht 64.5 in | Wt 224.0 lb

## 2022-06-05 DIAGNOSIS — I493 Ventricular premature depolarization: Secondary | ICD-10-CM

## 2022-06-05 DIAGNOSIS — F411 Generalized anxiety disorder: Secondary | ICD-10-CM

## 2022-06-05 DIAGNOSIS — Z23 Encounter for immunization: Secondary | ICD-10-CM | POA: Diagnosis not present

## 2022-06-05 DIAGNOSIS — G4733 Obstructive sleep apnea (adult) (pediatric): Secondary | ICD-10-CM

## 2022-06-05 DIAGNOSIS — I10 Essential (primary) hypertension: Secondary | ICD-10-CM | POA: Diagnosis not present

## 2022-06-05 DIAGNOSIS — I341 Nonrheumatic mitral (valve) prolapse: Secondary | ICD-10-CM | POA: Diagnosis not present

## 2022-06-05 MED ORDER — CITALOPRAM HYDROBROMIDE 20 MG PO TABS: ORAL_TABLET | ORAL | 3 refills | Status: DC

## 2022-06-05 MED ORDER — METOPROLOL SUCCINATE ER 25 MG PO TB24: 25.0000 mg | ORAL_TABLET | Freq: Every day | ORAL | 0 refills | Status: DC

## 2022-06-05 NOTE — Progress Notes (Signed)
Established Patient Office Visit  Subjective   Patient ID: [name], female    DOB: 1956/01/09  Age: 66 y.o. MRN: [id number]  Chief Complaint  Patient presents with   Hospitalization Follow-up    HPI Pt is a 66 yo obese female who presents to the clinic to follow up from ED visit on 05/30/2022 for CP and PVCs, She had labs with no concerns. Troponin's were good. Echo no concerns. CXR showed some right lower lobe coarsening but CTA was negative for any acute findings. Started on metoprolol and feeling better. She has been really stressed lately with new puppies at home. With her stress she increased her celexa to '20mg'$  daily. She feels like she is doing better.   Since metoprolol she has had no more PVC's. HR in the 50s.    .. Active Ambulatory Problems    Diagnosis Date Noted   UTI (urinary tract infection) 07/14/2012   Other and unspecified hyperlipidemia 07/14/2012   MVP (mitral valve prolapse) 07/14/2012   GERD (gastroesophageal reflux disease) 07/14/2012   Allergic rhinitis 07/14/2012   Mitral regurgitation 07/14/2012   S/P hysterectomy 07/14/2012   Essential hypertension, benign 04/29/2013   Wheezing 04/29/2013   Insomnia 04/29/2013   Unspecified vitamin D deficiency 04/29/2013   Migraine headache 04/29/2013   Anxiety state 04/29/2013   Unspecified hypothyroidism 04/29/2013   Obesity (BMI 30-39.9) 07/17/2013   Cough 08/15/2013   Arthritis of left knee 09/14/2013   Status post total knee replacement 09/14/2013   Abdominal pain, chronic, right upper quadrant 11/16/2013   Candidiasis of female genitalia 11/16/2013   Elevated liver enzymes 61/44/3154   Folliculitis 00/86/7619   Nausea alone 11/18/2013   Abnormal transaminases 11/18/2013   Hyperlipidemia 11/22/2014   Single kidney 11/22/2014   Hypothyroidism 11/22/2014   OSA on CPAP 12/23/2015   Daytime sleepiness 01/24/2017   Carpal tunnel syndrome, left upper limb 02/25/2017   Status post carpal tunnel  release 03/11/2017   Hypertriglyceridemia 07/14/2017   Elevated fasting glucose 07/14/2017   Chronic pain of right knee 09/13/2017   Unilateral primary osteoarthritis, right knee 09/27/2017   Pre-diabetes 10/19/2017   Pain in thumb joint with movement of right hand 10/19/2017   Other meniscus derangements, posterior horn of medial meniscus, right knee 11/24/2017   Pain of right thumb 11/24/2017   Trigger thumb, right thumb 11/24/2017   BPPV (benign paroxysmal positional vertigo), right 02/20/2018   Concussion wth loss of consciousness of 30 minutes or less 02/20/2018   Fall 02/20/2018   CKD (chronic kidney disease) stage 3, GFR 30-59 ml/min (HCC) 03/10/2019   BPPV (benign paroxysmal positional vertigo), left 05/22/2019   Pain radiating to back 05/23/2019   Elevated lipase 06/12/2019   Seborrheic keratoses 01/05/2020   Papules 01/05/2020   Class 2 obesity due to excess calories without serious comorbidity with body mass index (BMI) of 37.0 to 37.9 in adult 01/05/2020   Anxiety and depression 06/03/2014   Fracture of triquetral bone of wrist 02/21/2018   Primary osteoarthritis involving multiple joints 06/03/2014   Degenerative disc disease, cervical 04/11/2020   Right arm pain 04/16/2020   Right arm weakness 04/16/2020   Iron deficiency 04/17/2020   Airway compromise 05/28/2020   Laryngospasm 05/28/2020   Status post cervical spinal fusion 06/04/2020   SOB (shortness of breath) 06/04/2020   History of laryngeal spasm 06/04/2020   Atelectasis 06/04/2020   Frequent headaches 10/14/2020   Epidermal cyst 03/07/2021   Abnormal kidney function 03/10/2021   Iron deficiency anemia  03/10/2021   Asthmatic bronchitis 11/13/2021   Palpitations 12/09/2021   Polycythemia 12/09/2021   Sinobronchitis 02/20/2022   Bronchiectasis with acute exacerbation ([place]) 03/20/2022   Resolved Ambulatory Problems    Diagnosis Date Noted   Hypothyroidism 07/14/2012   Class 2 obesity due to excess  calories without serious comorbidity with body mass index (BMI) of 35.0 to 35.9 in adult 10/19/2017   Past Medical History:  Diagnosis Date   Acute medial meniscal tear    Allergy    Anxiety    Aortic insufficiency    Aortic regurgitation    Arthritis    Carpal tunnel syndrome    Chronic kidney disease    Dysrhythmia    Mitral valve prolapse    Need for SBE (subacute bacterial endocarditis) prophylaxis    Pilonidal cyst without mention of abscess    Pneumonia Aug 14 2012   PONV (postoperative nausea and vomiting)    Sleep apnea    Thyroid disease    Transplanted kidney removed      ROS See HPI.    Objective:     BP (!) 119/52   Pulse (!) 54   Ht 5' 4.5" (1.638 m)   Wt 224 lb (101.6 kg)   SpO2 97%   BMI 37.86 kg/m  BP Readings from Last 3 Encounters:  06/05/22 (!) 119/52  05/12/22 (!) 142/59  05/06/22 (!) 117/54   Wt Readings from Last 3 Encounters:  06/05/22 224 lb (101.6 kg)  05/12/22 228 lb 6.4 oz (103.6 kg)  05/06/22 223 lb (101.2 kg)      Physical Exam Constitutional:      Appearance: Normal appearance. She is obese.  HENT:     Head: Normocephalic.  Cardiovascular:     Rate and Rhythm: Normal rate and regular rhythm.     Pulses: Normal pulses.     Heart sounds: Normal heart sounds.  Pulmonary:     Effort: Pulmonary effort is normal.     Breath sounds: Normal breath sounds.  Lymphadenopathy:     Cervical: No cervical adenopathy.  Neurological:     General: No focal deficit present.     Mental Status: She is alert and oriented to person, place, and time.  Psychiatric:        Mood and Affect: Mood normal.       Assessment & Plan:  Marland KitchenMarland KitchenRiven was seen today for hospitalization follow-up.  Diagnoses and all orders for this visit:  PVC (premature ventricular contraction) -     metoprolol succinate (TOPROL-XL) 25 MG 24 hr tablet; Take 1 tablet (25 mg total) by mouth daily.  Essential hypertension, benign  MVP (mitral valve prolapse)  OSA  on CPAP  Anxiety state -     citalopram (CELEXA) 20 MG tablet; Take one tablet daily.  Need for immunization against influenza -     Flu Vaccine QUAD High Dose(Fluad)   Labs UTD Pt has cardiologist if needed Vitals good No PVC HO given Continue metoprolol today, refilled Follow up in 6 months  Refilled celexa '20mg'$  daily   Flu shot given Needs tdap/RSV/covid at [place], PA-C

## 2022-06-05 NOTE — Patient Instructions (Signed)
Premature Ventricular Contraction  A premature ventricular contraction (PVC) is a type of irregular heartbeat (arrhythmia). The heart has four chambers, including the upper chambers (atria) and lower chambers (ventricles). Normally, an electrical signal starts in a group of cells called the sinoatrial node (SA node) and travels through the atria, causing them to pump blood into the ventricles. During a PVC, the heartbeat starts in one of the lower ventricles. This may cause the heartbeat to be shorter and less effective. In most cases, PVCs come and go and do not require treatment. What are the causes? Common causes of the condition include: Heart attack or coronary artery disease (CAD). Heart valve problems. Heart surgery. Infection of the heart (myocarditis). Inflammation of the heart. In many cases, the cause of this condition is not known. What increases the risk? The following factors may make you more likely to develop this condition: Age, especially being over age 75. Being female. An imbalance of salts and minerals in the body (electrolytes). Low blood oxygen levels or high carbon dioxide levels. Certain medicines, including over-the-counter and prescribed medicines. High blood pressure. Obesity. Episodes may be triggered by: Vigorous exercise. Tobacco, alcohol, or caffeine use. Illegal drug use. Emotional stress. Poor or irregular sleep. What are the signs or symptoms? The main symptoms of this condition are fast or irregular heartbeats (palpitations) or the feeling of a pause in the heartbeat. Other symptoms include: Shortness of breath. Difficulty exercising. Chest pain. Feeling tired. Dizziness. In some cases, there are no symptoms. How is this diagnosed? This condition may be diagnosed based on: Your medical history or symptoms. A physical exam. Your health care provider may listen to your heart. Tests, such as: Blood tests. An ECG (electrocardiogram) to monitor the  electrical activity of your heart. An ambulatory cardiac monitor that records your heartbeats for 24 hours or more. Stress tests to see how exercise affects your heart rhythm and blood supply. An echocardiogram, which creates an image of your heart. An electrophysiology study (EPS) to check for electrical problems in your heart. How is this treated? Treatment for this condition depends on any underlying conditions, the type of PVC, how many PVCs you have had, and if the symptoms are affecting your daily life. Possible treatments include: Avoiding things that cause PVCs (triggers). These include caffeine, tobacco, and alcohol. Taking medicines if symptoms are severe or if the arrhythmias happen a lot. Getting treatment for underlying conditions that cause PVCs. Having an implantable cardioverter defibrillator (ICD) placed in the chest to monitor the heartbeat. The monitor sends a shock to the heart if it senses an arrhythmia and brings the heartbeat back to normal. Having a catheter ablation procedure to destroy the part of the heart tissue that sends abnormal signals. In many cases, no treatment is required. Follow these instructions at home: Lifestyle  Do not use any products that contain nicotine or tobacco. These products include cigarettes, chewing tobacco, and vaping devices, such as e-cigarettes. If you need help quitting, ask your health care provider. Do not use illegal drugs. Exercise regularly. Ask your health care provider what type of exercise is safe for you. Try to get at least 7-9 hours of sleep each night. Find healthy ways to manage stress. Avoid stressful situations when possible. Alcohol use Do not drink alcohol if: Your health care provider tells you not to drink. You are pregnant, may be pregnant, or are planning to become pregnant. Alcohol triggers your episodes. If you drink alcohol: Limit how much you have to: 0-1   drink a day for women. 0-2 drinks a day for  men. Know how much alcohol is in your drink. In the [place] one drink equals one 12 oz bottle of beer (355 mL), one 5 oz glass of wine (148 mL), or one 1 oz glass of hard liquor (44 mL). General instructions Take over-the-counter and prescription medicines only as told by your health care provider. If caffeine triggers episodes of PVC, do not eat, drink, or use anything with caffeine in it. Contact a health care provider if: You feel palpitations often. You have nausea and vomiting. Get help right away if: You have chest pain. You have trouble breathing. You start sweating for no reason. You become light-headed or you faint. These symptoms may be an emergency. Get help right away. Call 911. Do not wait to see if the symptoms will go away. Do not drive yourself to the hospital. This information is not intended to replace advice given to you by your health care provider. Make sure you discuss any questions you have with your health care provider. Document Revised: 12/26/2021 Document Reviewed: 12/26/2021 Elsevier Patient Education  2023 [employer].  

## 2022-06-12 ENCOUNTER — Inpatient Hospital Stay: Payer: Medicare Other

## 2022-06-12 ENCOUNTER — Inpatient Hospital Stay: Payer: Medicare Other | Attending: Hematology & Oncology

## 2022-06-12 DIAGNOSIS — D45 Polycythemia vera: Secondary | ICD-10-CM | POA: Diagnosis not present

## 2022-06-12 DIAGNOSIS — D500 Iron deficiency anemia secondary to blood loss (chronic): Secondary | ICD-10-CM

## 2022-06-12 DIAGNOSIS — D751 Secondary polycythemia: Secondary | ICD-10-CM

## 2022-06-12 LAB — CMP (CANCER CENTER ONLY)
ALT: 21 U/L (ref 0–44)
AST: 24 U/L (ref 15–41)
Albumin: 4.5 g/dL (ref 3.5–5.0)
Alkaline Phosphatase: 74 U/L (ref 38–126)
Anion gap: 7 (ref 5–15)
BUN: 18 mg/dL (ref 8–23)
CO2: 27 mmol/L (ref 22–32)
Calcium: 9.9 mg/dL (ref 8.9–10.3)
Chloride: 106 mmol/L (ref 98–111)
Creatinine: 1.4 mg/dL — ABNORMAL HIGH (ref 0.44–1.00)
GFR, Estimated: 41 mL/min — ABNORMAL LOW (ref >60)
Glucose, Bld: 126 mg/dL — ABNORMAL HIGH (ref 70–99)
Potassium: 4.1 mmol/L (ref 3.5–5.1)
Sodium: 140 mmol/L (ref 135–145)
Total Bilirubin: 0.9 mg/dL (ref 0.3–1.2)
Total Protein: 7 g/dL (ref 6.5–8.1)

## 2022-06-12 LAB — CBC WITH DIFFERENTIAL (CANCER CENTER ONLY)
Abs Immature Granulocytes: 0.06 10*3/uL (ref 0.00–0.07)
Basophils Absolute: 0.1 10*3/uL (ref 0.0–0.1)
Basophils Relative: 1 %
Eosinophils Absolute: 0.4 10*3/uL (ref 0.0–0.5)
Eosinophils Relative: 3 %
HCT: 45.4 % (ref 36.0–46.0)
Hemoglobin: 12.8 g/dL (ref 12.0–15.0)
Immature Granulocytes: 1 %
Lymphocytes Relative: 16 %
Lymphs Abs: 1.8 10*3/uL (ref 0.7–4.0)
MCH: 18.5 pg — ABNORMAL LOW (ref 26.0–34.0)
MCHC: 28.2 g/dL — ABNORMAL LOW (ref 30.0–36.0)
MCV: 65.5 fL — ABNORMAL LOW (ref 80.0–100.0)
Monocytes Absolute: 0.6 10*3/uL (ref 0.1–1.0)
Monocytes Relative: 5 %
Neutro Abs: 8.4 10*3/uL — ABNORMAL HIGH (ref 1.7–7.7)
Neutrophils Relative %: 74 %
Platelet Count: 596 10*3/uL — ABNORMAL HIGH (ref 150–400)
RBC: 6.93 MIL/uL — ABNORMAL HIGH (ref 3.87–5.11)
RDW: 21.8 % — ABNORMAL HIGH (ref 11.5–15.5)
WBC Count: 11.4 10*3/uL — ABNORMAL HIGH (ref 4.0–10.5)
nRBC: 0 % (ref 0.0–0.2)

## 2022-06-12 LAB — IRON AND IRON BINDING CAPACITY (CC-WL,HP ONLY)
Iron: 24 ug/dL — ABNORMAL LOW (ref 28–170)
Saturation Ratios: 5 % — ABNORMAL LOW (ref 10.4–31.8)
TIBC: 522 ug/dL — ABNORMAL HIGH (ref 250–450)
UIBC: 498 ug/dL — ABNORMAL HIGH (ref 148–442)

## 2022-06-12 LAB — FERRITIN: Ferritin: 6 ng/mL — ABNORMAL LOW (ref 11–307)

## 2022-06-12 LAB — LACTATE DEHYDROGENASE: LDH: 316 U/L — ABNORMAL HIGH (ref 98–192)

## 2022-06-12 NOTE — Progress Notes (Signed)
Pt HCT 45.3. Pt requested to defer phlebotomy. No further concerns.

## 2022-06-17 ENCOUNTER — Ambulatory Visit
Admission: RE | Admit: 2022-06-17 | Discharge: 2022-06-17 | Disposition: A | Discharge status: Home / Self Care | Payer: Medicare Other | Source: Ambulatory Visit | Attending: Physician Assistant | Admitting: Physician Assistant

## 2022-06-17 DIAGNOSIS — Z1231 Encounter for screening mammogram for malignant neoplasm of breast: Secondary | ICD-10-CM

## 2022-06-19 NOTE — Progress Notes (Signed)
Normal mammogram. Follow up in 1 year.

## 2022-06-21 ENCOUNTER — Other Ambulatory Visit: Payer: Self-pay | Admitting: Physician Assistant

## 2022-06-21 DIAGNOSIS — K2100 Gastro-esophageal reflux disease with esophagitis, without bleeding: Secondary | ICD-10-CM

## 2022-06-24 ENCOUNTER — Other Ambulatory Visit: Payer: Self-pay | Admitting: Physician Assistant

## 2022-06-24 DIAGNOSIS — I493 Ventricular premature depolarization: Secondary | ICD-10-CM

## 2022-06-25 MED ORDER — METOPROLOL SUCCINATE ER 25 MG PO TB24: 25.0000 mg | ORAL_TABLET | Freq: Every day | ORAL | 0 refills | Status: DC

## 2022-07-10 ENCOUNTER — Other Ambulatory Visit: Payer: Self-pay | Admitting: *Deleted

## 2022-07-10 DIAGNOSIS — D500 Iron deficiency anemia secondary to blood loss (chronic): Secondary | ICD-10-CM

## 2022-07-10 DIAGNOSIS — D75839 Thrombocytosis, unspecified: Secondary | ICD-10-CM

## 2022-07-10 DIAGNOSIS — D751 Secondary polycythemia: Secondary | ICD-10-CM

## 2022-07-13 ENCOUNTER — Inpatient Hospital Stay (HOSPITAL_BASED_OUTPATIENT_CLINIC_OR_DEPARTMENT_OTHER): Payer: Medicare Other | Admitting: Family

## 2022-07-13 ENCOUNTER — Inpatient Hospital Stay: Payer: Medicare Other

## 2022-07-13 ENCOUNTER — Other Ambulatory Visit: Payer: Self-pay

## 2022-07-13 ENCOUNTER — Inpatient Hospital Stay: Payer: Medicare Other | Attending: Hematology & Oncology

## 2022-07-13 ENCOUNTER — Encounter: Payer: Self-pay | Admitting: Family

## 2022-07-13 VITALS — BP 132/60 | HR 67 | Resp 17

## 2022-07-13 VITALS — BP 120/63 | HR 57 | Temp 97.7°F | Resp 18 | Ht 64.5 in | Wt 226.0 lb

## 2022-07-13 DIAGNOSIS — Z7982 Long term (current) use of aspirin: Secondary | ICD-10-CM | POA: Insufficient documentation

## 2022-07-13 DIAGNOSIS — Z79899 Other long term (current) drug therapy: Secondary | ICD-10-CM | POA: Diagnosis not present

## 2022-07-13 DIAGNOSIS — D751 Secondary polycythemia: Secondary | ICD-10-CM | POA: Diagnosis not present

## 2022-07-13 DIAGNOSIS — D45 Polycythemia vera: Secondary | ICD-10-CM | POA: Diagnosis present

## 2022-07-13 DIAGNOSIS — D500 Iron deficiency anemia secondary to blood loss (chronic): Secondary | ICD-10-CM

## 2022-07-13 DIAGNOSIS — D75839 Thrombocytosis, unspecified: Secondary | ICD-10-CM

## 2022-07-13 LAB — LACTATE DEHYDROGENASE: LDH: 314 U/L — ABNORMAL HIGH (ref 98–192)

## 2022-07-13 LAB — CBC WITH DIFFERENTIAL (CANCER CENTER ONLY)
Abs Immature Granulocytes: 0.06 10*3/uL (ref 0.00–0.07)
Basophils Absolute: 0.1 10*3/uL (ref 0.0–0.1)
Basophils Relative: 1 %
Eosinophils Absolute: 0.6 10*3/uL — ABNORMAL HIGH (ref 0.0–0.5)
Eosinophils Relative: 5 %
HCT: 45.2 % (ref 36.0–46.0)
Hemoglobin: 12.7 g/dL (ref 12.0–15.0)
Immature Granulocytes: 1 %
Lymphocytes Relative: 17 %
Lymphs Abs: 2.1 10*3/uL (ref 0.7–4.0)
MCH: 18.6 pg — ABNORMAL LOW (ref 26.0–34.0)
MCHC: 28.1 g/dL — ABNORMAL LOW (ref 30.0–36.0)
MCV: 66.2 fL — ABNORMAL LOW (ref 80.0–100.0)
Monocytes Absolute: 0.7 10*3/uL (ref 0.1–1.0)
Monocytes Relative: 5 %
Neutro Abs: 8.6 10*3/uL — ABNORMAL HIGH (ref 1.7–7.7)
Neutrophils Relative %: 71 %
Platelet Count: 660 10*3/uL — ABNORMAL HIGH (ref 150–400)
RBC: 6.83 MIL/uL — ABNORMAL HIGH (ref 3.87–5.11)
RDW: 21.8 % — ABNORMAL HIGH (ref 11.5–15.5)
WBC Count: 12.1 10*3/uL — ABNORMAL HIGH (ref 4.0–10.5)
nRBC: 0 % (ref 0.0–0.2)

## 2022-07-13 LAB — CMP (CANCER CENTER ONLY)
ALT: 23 U/L (ref 0–44)
AST: 24 U/L (ref 15–41)
Albumin: 4.7 g/dL (ref 3.5–5.0)
Alkaline Phosphatase: 75 U/L (ref 38–126)
Anion gap: 8 (ref 5–15)
BUN: 19 mg/dL (ref 8–23)
CO2: 28 mmol/L (ref 22–32)
Calcium: 10.4 mg/dL — ABNORMAL HIGH (ref 8.9–10.3)
Chloride: 102 mmol/L (ref 98–111)
Creatinine: 1.35 mg/dL — ABNORMAL HIGH (ref 0.44–1.00)
GFR, Estimated: 43 mL/min — ABNORMAL LOW (ref >60)
Glucose, Bld: 111 mg/dL — ABNORMAL HIGH (ref 70–99)
Potassium: 4.7 mmol/L (ref 3.5–5.1)
Sodium: 138 mmol/L (ref 135–145)
Total Bilirubin: 0.9 mg/dL (ref 0.3–1.2)
Total Protein: 7.2 g/dL (ref 6.5–8.1)

## 2022-07-13 LAB — IRON AND IRON BINDING CAPACITY (CC-WL,HP ONLY)
Iron: 13 ug/dL — ABNORMAL LOW (ref 28–170)
Saturation Ratios: 3 % — ABNORMAL LOW (ref 10.4–31.8)
TIBC: 529 ug/dL — ABNORMAL HIGH (ref 250–450)
UIBC: 516 ug/dL — ABNORMAL HIGH (ref 148–442)

## 2022-07-13 LAB — FERRITIN: Ferritin: 9 ng/mL — ABNORMAL LOW (ref 11–307)

## 2022-07-13 MED ORDER — SODIUM CHLORIDE 0.9 % IV SOLN: Freq: Once | INTRAVENOUS | Status: AC

## 2022-07-13 NOTE — Progress Notes (Signed)
[name] presents today for phlebotomy per MD orders. Per Lottie Dawson NP only going to take 250 off and give 250 cc in replacement fluids. Phlebotomy procedure started at 1422 and ended at 1437. 255 grams removed via 18 gauge needle to left AC. Pt received replacement fluids prior to phlebotomy.  Pt declined to stay for post procedure observation period stating she has tolerated procedure well before multiple times without difficulty. Patient tolerated procedure well. IV needle removed intact.

## 2022-07-13 NOTE — Progress Notes (Signed)
Hematology and Oncology Follow Up Visit  [name] [id number] 04-25-1956 66 y.o. 07/13/2022   Principle Diagnosis:  Polycythemia vera, JAK 2 positive    Current Therapy:        Phlebotomy as indicated to maintain Hct < 45%   Interim History:  [name] is here today for follow-up. She is doing well and has no complaints at this time.  Hct is 45.2%.  No fever, chills, n/v, cough, rash, dizziness, SOB, chest pain, palpitations, abdominal pain or changes in bowel or bladder habits.  No blood loss, bruising or petechiae.  No swelling, tenderness, numbness or tingling in her extremities at this time.  No falls or syncope reported.  Appetite and hydration are good. Weight is 226 lbs.   ECOG Performance Status: 1 - Symptomatic but completely ambulatory  Medications:  Allergies as of 07/13/2022       Reactions   Percocet [oxycodone-acetaminophen] Other (See Comments)   SYNCOPE TACHYCARDIA   Shrimp [shellfish Allergy] Anaphylaxis, Itching, Nausea And Vomiting, Swelling   Hydrocodone-acetaminophen Nausea And Vomiting, Other (See Comments)   Dizziness, also   Lipitor [atorvastatin] Other (See Comments)   Ear pain/fullness in ear "as if I were under water"   Saxenda [liraglutide -weight Management] Hives, Itching, Other (See Comments)   Hives/itching/site reaction   Adhesive [tape] Rash, Other (See Comments)   EKG leads and certain bandages break out the skin   Belviq Xr [lorcaserin Hcl Er] Other (See Comments)   Gained weight    Ferrous Sulfate Nausea Only   Morphine And Related Nausea And Vomiting        Medication List        Accurate as of July 13, 2022  1:52 PM. If you have any questions, ask your nurse or doctor.          acetaminophen 325 MG tablet Commonly known as: TYLENOL Take 325-650 mg by mouth every 6 (six) hours as needed for mild pain or headache.   albuterol 108 (90 Base) MCG/ACT inhaler Commonly known as: VENTOLIN HFA Inhale 2 puffs into  the lungs every 6 (six) hours as needed for wheezing or shortness of breath.   Aspirin 81 MG Caps Take 81 mg by mouth daily at 6 (six) AM.   B-12 PO Take 1 tablet by mouth daily.   Breztri Aerosphere 160-9-4.8 MCG/ACT Aero Generic drug: Budeson-Glycopyrrol-Formoterol Inhale into the lungs. What changed: Another medication with the same name was removed. Continue taking this medication, and follow the directions you see here. Changed by: Lottie Dawson, NP   calcium carbonate 500 MG chewable tablet Commonly known as: TUMS - dosed in mg elemental calcium Chew 1 tablet by mouth as needed for indigestion or heartburn.   citalopram 20 MG tablet Commonly known as: CELEXA Take one tablet daily.   ipratropium-albuterol 0.5-2.5 (3) MG/3ML Soln Commonly known as: DUONEB TAKE 3 MLS BY NEBULIZATION EVERY 2 (TWO) HOURS AS NEEDED (WHEEZE, SOB).   levothyroxine 125 MCG tablet Commonly known as: SYNTHROID Take 1 tablet (125 mcg total) by mouth daily before breakfast.   lisinopril 5 MG tablet Commonly known as: ZESTRIL Take 1 tablet (5 mg total) by mouth daily. needs appointment   LUBRICATING EYE DROPS OP Place 1 drop into both eyes as needed (dry eyes).   metoprolol succinate 25 MG 24 hr tablet Commonly known as: TOPROL-XL Take 1 tablet (25 mg total) by mouth daily.   pantoprazole 40 MG tablet Commonly known as: PROTONIX TAKE 1 TABLET BY MOUTH EVERY  DAY   promethazine 25 MG tablet Commonly known as: PHENERGAN Take 1 tablet (25 mg total) by mouth as needed.   rosuvastatin 10 MG tablet Commonly known as: CRESTOR Take 1 tablet (10 mg total) by mouth daily.   Vitamin D3 75 MCG (3000 UT) Tabs Take 1,000 Units by mouth daily.        Allergies:  Allergies  Allergen Reactions   Percocet [Oxycodone-Acetaminophen] Other (See Comments)    SYNCOPE TACHYCARDIA   Shrimp [Shellfish Allergy] Anaphylaxis, Itching, Nausea And Vomiting and Swelling   Hydrocodone-Acetaminophen Nausea And  Vomiting and Other (See Comments)    Dizziness, also   Lipitor [Atorvastatin] Other (See Comments)    Ear pain/fullness in ear "as if I were under water"   Saxenda [Liraglutide -Weight Management] Hives, Itching and Other (See Comments)    Hives/itching/site reaction   Adhesive [Tape] Rash and Other (See Comments)    EKG leads and certain bandages break out the skin   Belviq Xr [Lorcaserin Hcl Er] Other (See Comments)    Gained weight    Ferrous Sulfate Nausea Only   Morphine And Related Nausea And Vomiting    Past Medical History, Surgical history, Social history, and Family History were reviewed and updated.  Review of Systems: All other 10 point review of systems is negative.   Physical Exam:  height is 5' 4.5" (1.638 m) and weight is 226 lb (102.5 kg). Her oral temperature is 97.7 F (36.5 C). Her blood pressure is 120/63 and her pulse is 57 (abnormal). Her respiration is 18 and oxygen saturation is 100%.   Wt Readings from Last 3 Encounters:  07/13/22 226 lb (102.5 kg)  06/05/22 224 lb (101.6 kg)  05/12/22 228 lb 6.4 oz (103.6 kg)    Ocular: Sclerae unicteric, pupils equal, round and reactive to light Ear-nose-throat: Oropharynx clear, dentition fair Lymphatic: No cervical or supraclavicular adenopathy Lungs no rales or rhonchi, good excursion bilaterally Heart regular rate and rhythm, no murmur appreciated Abd soft, nontender, positive bowel sounds MSK no focal spinal tenderness, no joint edema Neuro: non-focal, well-oriented, appropriate affect Breasts: Deferred   Lab Results  Component Value Date   WBC 12.1 (H) 07/13/2022   HGB 12.7 07/13/2022   HCT 45.2 07/13/2022   MCV 66.2 (L) 07/13/2022   PLT 660 (H) 07/13/2022   Lab Results  Component Value Date   FERRITIN 6 (L) 06/12/2022   IRON 24 (L) 06/12/2022   TIBC 522 (H) 06/12/2022   UIBC 498 (H) 06/12/2022   IRONPCTSAT 5 (L) 06/12/2022   Lab Results  Component Value Date   RETICCTPCT 2.6 12/17/2021   RBC  6.83 (H) 07/13/2022   No results found for: "KPAFRELGTCHN", "LAMBDASER", "KAPLAMBRATIO" No results found for: "IGGSERUM", "IGA", "IGMSERUM" No results found for: "TOTALPROTELP", "ALBUMINELP", "A1GS", "A2GS", "BETS", "BETA2SER", "GAMS", "MSPIKE", "SPEI"   Chemistry      Component Value Date/Time   NA 138 07/13/2022 1306   K 4.7 07/13/2022 1306   CL 102 07/13/2022 1306   CO2 28 07/13/2022 1306   BUN 19 07/13/2022 1306   CREATININE 1.35 (H) 07/13/2022 1306   CREATININE 1.18 (H) 12/11/2021 1221      Component Value Date/Time   CALCIUM 10.4 (H) 07/13/2022 1306   ALKPHOS 75 07/13/2022 1306   AST 24 07/13/2022 1306   ALT 23 07/13/2022 1306   BILITOT 0.9 07/13/2022 1306       Impression and Plan: Ms. Creason is a very pleasant 66 yo caucasian female with polycythemia, JAK  2 positive. We will do a partial phlebotomy today with replacement fluids first.  No phlebotomy needed at this time. Hct 43.6%.  Follow-up with MD in 8 weeks.   Lottie Dawson, NP 12/4/20231:52 PM

## 2022-07-13 NOTE — Patient Instructions (Signed)

## 2022-08-14 ENCOUNTER — Other Ambulatory Visit: Payer: Self-pay | Admitting: Physician Assistant

## 2022-08-14 DIAGNOSIS — I10 Essential (primary) hypertension: Secondary | ICD-10-CM

## 2022-08-14 MED ORDER — LISINOPRIL 5 MG PO TABS: 5.0000 mg | ORAL_TABLET | Freq: Every day | ORAL | 0 refills | Status: DC

## 2022-08-26 ENCOUNTER — Ambulatory Visit (INDEPENDENT_AMBULATORY_CARE_PROVIDER_SITE_OTHER): Payer: Medicare Other | Admitting: Physician Assistant

## 2022-08-26 ENCOUNTER — Encounter: Payer: Self-pay | Admitting: Physician Assistant

## 2022-08-26 VITALS — BP 131/53 | HR 54 | Ht 64.5 in | Wt 223.0 lb

## 2022-08-26 DIAGNOSIS — R519 Headache, unspecified: Secondary | ICD-10-CM

## 2022-08-26 DIAGNOSIS — D751 Secondary polycythemia: Secondary | ICD-10-CM

## 2022-08-26 DIAGNOSIS — M5030 Other cervical disc degeneration, unspecified cervical region: Secondary | ICD-10-CM | POA: Diagnosis not present

## 2022-08-26 MED ORDER — DEXAMETHASONE 4 MG PO TABS: 4.0000 mg | ORAL_TABLET | Freq: Two times a day (BID) | ORAL | 0 refills | Status: DC

## 2022-08-26 NOTE — Progress Notes (Signed)
Acute Office Visit  Subjective:     Patient ID: [name], female    DOB: [birth year], 67 y.o.   MRN: [id number]  Chief Complaint  Patient presents with   Headache    HPI Patient is in today for a right sided headache that has gone on for 3 weeks. Pt has history of cervical DDD and migraines. Her migraines have not been a problem in some time. She denies any nausea, vomiting or vision changes. She does not feel like this is a migraine. Pt denies any ear pain, sinus pressure, ST, cough. She does have some vertigo when she turns her head to the left laying down only. Hx of BPPV. She has not had any injury to neck. Her neck ROM is not full capacity due to his of cervical spinal fusion. She has been taking NSAIDS and muscle relaxers with not much benefit. Most of the pain is dull but she does have some sharp pain. No pain radiating down arm or arm weakness. No temple tenderness.   .. Active Ambulatory Problems    Diagnosis Date Noted   UTI (urinary tract infection) 07/14/2012   Other and unspecified hyperlipidemia 07/14/2012   MVP (mitral valve prolapse) 07/14/2012   GERD (gastroesophageal reflux disease) 07/14/2012   Allergic rhinitis 07/14/2012   Mitral regurgitation 07/14/2012   S/P hysterectomy 07/14/2012   Essential hypertension, benign 04/29/2013   Wheezing 04/29/2013   Insomnia 04/29/2013   Unspecified vitamin D deficiency 04/29/2013   Migraine headache 04/29/2013   Anxiety state 04/29/2013   Unspecified hypothyroidism 04/29/2013   Obesity (BMI 30-39.9) 07/17/2013   Cough 08/15/2013   Arthritis of left knee 09/14/2013   Status post total knee replacement 09/14/2013   Abdominal pain, chronic, right upper quadrant 11/16/2013   Candidiasis of female genitalia 11/16/2013   Elevated liver enzymes 29/92/4268   Folliculitis 34/19/6222   Nausea alone 11/18/2013   Abnormal transaminases 11/18/2013   Hyperlipidemia 11/22/2014   Single kidney 11/22/2014   Hypothyroidism  11/22/2014   OSA on CPAP 12/23/2015   Daytime sleepiness 01/24/2017   Carpal tunnel syndrome, left upper limb 02/25/2017   Status post carpal tunnel release 03/11/2017   Hypertriglyceridemia 07/14/2017   Elevated fasting glucose 07/14/2017   Chronic pain of right knee 09/13/2017   Unilateral primary osteoarthritis, right knee 09/27/2017   Pre-diabetes 10/19/2017   Pain in thumb joint with movement of right hand 10/19/2017   Other meniscus derangements, posterior horn of medial meniscus, right knee 11/24/2017   Pain of right thumb 11/24/2017   Trigger thumb, right thumb 11/24/2017   BPPV (benign paroxysmal positional vertigo), right 02/20/2018   Concussion wth loss of consciousness of 30 minutes or less 02/20/2018   Fall 02/20/2018   CKD (chronic kidney disease) stage 3, GFR 30-59 ml/min (HCC) 03/10/2019   BPPV (benign paroxysmal positional vertigo), left 05/22/2019   Pain radiating to back 05/23/2019   Elevated lipase 06/12/2019   Seborrheic keratoses 01/05/2020   Papules 01/05/2020   Class 2 obesity due to excess calories without serious comorbidity with body mass index (BMI) of 37.0 to 37.9 in adult 01/05/2020   Anxiety and depression 06/03/2014   Fracture of triquetral bone of wrist 02/21/2018   Primary osteoarthritis involving multiple joints 06/03/2014   Degenerative disc disease, cervical 04/11/2020   Right arm pain 04/16/2020   Right arm weakness 04/16/2020   Iron deficiency 04/17/2020   Airway compromise 05/28/2020   Laryngospasm 05/28/2020   Status post cervical spinal fusion 06/04/2020  SOB (shortness of breath) 06/04/2020   History of laryngeal spasm 06/04/2020   Atelectasis 06/04/2020   Frequent headaches 10/14/2020   Epidermal cyst 03/07/2021   Abnormal kidney function 03/10/2021   Iron deficiency anemia 03/10/2021   Asthmatic bronchitis 11/13/2021   Palpitations 12/09/2021   Polycythemia 12/09/2021   Sinobronchitis 02/20/2022   Bronchiectasis with acute  exacerbation ([place]) 03/20/2022   Right-sided headache 08/26/2022   Resolved Ambulatory Problems    Diagnosis Date Noted   Hypothyroidism 07/14/2012   Class 2 obesity due to excess calories without serious comorbidity with body mass index (BMI) of 35.0 to 35.9 in adult 10/19/2017   Past Medical History:  Diagnosis Date   Acute medial meniscal tear    Allergy    Anxiety    Aortic insufficiency    Aortic regurgitation    Arthritis    Carpal tunnel syndrome    Chronic kidney disease    Dysrhythmia    Mitral valve prolapse    Need for SBE (subacute bacterial endocarditis) prophylaxis    Pilonidal cyst without mention of abscess    Pneumonia Aug 14 2012   PONV (postoperative nausea and vomiting)    Sleep apnea    Thyroid disease    Transplanted kidney removed      ROS  See HPI.     Objective:    BP (!) 131/53   Pulse (!) 54   Ht 5' 4.5" (1.638 m)   Wt 223 lb (101.2 kg)   SpO2 97%   BMI 37.69 kg/m  BP Readings from Last 3 Encounters:  08/26/22 (!) 131/53  07/13/22 132/60  07/13/22 120/63   Wt Readings from Last 3 Encounters:  08/26/22 223 lb (101.2 kg)  07/13/22 226 lb (102.5 kg)  06/05/22 224 lb (101.6 kg)      Physical Exam Constitutional:      Appearance: She is well-developed. She is obese.  HENT:     Head: Normocephalic.     Mouth/Throat:     Mouth: Mucous membranes are moist.  Eyes:     General: No visual field deficit or scleral icterus.    Extraocular Movements: Extraocular movements intact.     Pupils: Pupils are equal, round, and reactive to light. Pupils are equal.  Neck:     Comments: Decreased ROM of neck due to spinal fusion limitations. Cardiovascular:     Rate and Rhythm: Normal rate.  Pulmonary:     Effort: Pulmonary effort is normal.  Lymphadenopathy:     Cervical: No cervical adenopathy.  Skin:    Comments: No temple tenderness Tight upper back and neck muscles right worse than left No tenderness to palpation over cervical spine   Neurological:     Mental Status: She is alert and oriented to person, place, and time.     Cranial Nerves: No cranial nerve deficit, dysarthria or facial asymmetry.     Sensory: No sensory deficit.     Motor: No weakness.     Gait: Gait normal.  Psychiatric:        Mood and Affect: Mood normal.          Assessment & Plan:  Marland KitchenMarland KitchenCarmine was seen today for headache.  Diagnoses and all orders for this visit:  Right-sided headache -     dexamethasone (DECADRON) 4 MG tablet; Take 1 tablet (4 mg total) by mouth 2 (two) times daily with a meal.  Polycythemia -     CBC w/Diff/Platelet -     Iron, TIBC and  Ferritin Panel  Degenerative disc disease, cervical -     dexamethasone (DECADRON) 4 MG tablet; Take 1 tablet (4 mg total) by mouth 2 (two) times daily with a meal.  No red flag headache symptoms or findings No temple tenderness Suspect cervical DDD as cause to headache Start dexamethasone for 5 days Continue flexeril  Use heat, tens unit, icy hot patches, massage If no improvement let me know  Hx of polycythema CBC to check hematocrit/iron level   Return if symptoms worsen or fail to improve.  Iran Planas, PA-C

## 2022-08-27 ENCOUNTER — Encounter: Payer: Self-pay | Admitting: Physician Assistant

## 2022-08-27 LAB — CBC WITH DIFFERENTIAL/PLATELET
Absolute Monocytes: 536 cells/uL (ref 200–950)
Basophils Absolute: 167 cells/uL (ref 0–200)
Basophils Relative: 1.4 %
Eosinophils Absolute: 619 cells/uL — ABNORMAL HIGH (ref 15–500)
Eosinophils Relative: 5.2 %
HCT: 44.9 % (ref 35.0–45.0)
Hemoglobin: 12.9 g/dL (ref 11.7–15.5)
Lymphs Abs: 1916 cells/uL (ref 850–3900)
MCH: 18.5 pg — ABNORMAL LOW (ref 27.0–33.0)
MCHC: 28.7 g/dL — ABNORMAL LOW (ref 32.0–36.0)
MCV: 64.3 fL — ABNORMAL LOW (ref 80.0–100.0)
MPV: 11.1 fL (ref 7.5–12.5)
Monocytes Relative: 4.5 %
Neutro Abs: 8663 cells/uL — ABNORMAL HIGH (ref 1500–7800)
Neutrophils Relative %: 72.8 %
Platelets: 630 10*3/uL — ABNORMAL HIGH (ref 140–400)
RBC: 6.98 10*6/uL — ABNORMAL HIGH (ref 3.80–5.10)
RDW: 19.9 % — ABNORMAL HIGH (ref 11.0–15.0)
Total Lymphocyte: 16.1 %
WBC: 11.9 10*3/uL — ABNORMAL HIGH (ref 3.8–10.8)

## 2022-08-27 LAB — IRON,TIBC AND FERRITIN PANEL
%SAT: 5 % (calc) — ABNORMAL LOW (ref 16–45)
Ferritin: 5 ng/mL — ABNORMAL LOW (ref 16–288)
Iron: 22 ug/dL — ABNORMAL LOW (ref 45–160)
TIBC: 461 mcg/dL (calc) — ABNORMAL HIGH (ref 250–450)

## 2022-08-27 LAB — CBC MORPHOLOGY

## 2022-08-28 MED ORDER — GABAPENTIN 100 MG PO CAPS: ORAL_CAPSULE | ORAL | 0 refills | Status: DC

## 2022-08-28 NOTE — Progress Notes (Signed)
Hematocrit is down from 1 month ago.  Hemoglobin stable RBC production up a little   No concerns.

## 2022-09-07 ENCOUNTER — Encounter: Payer: Self-pay | Admitting: Family

## 2022-09-07 ENCOUNTER — Inpatient Hospital Stay: Payer: Medicare Other

## 2022-09-07 ENCOUNTER — Inpatient Hospital Stay: Payer: Medicare Other | Attending: Hematology & Oncology

## 2022-09-07 ENCOUNTER — Inpatient Hospital Stay (HOSPITAL_BASED_OUTPATIENT_CLINIC_OR_DEPARTMENT_OTHER): Payer: Medicare Other | Admitting: Family

## 2022-09-07 ENCOUNTER — Other Ambulatory Visit: Payer: Self-pay

## 2022-09-07 ENCOUNTER — Ambulatory Visit: Payer: Medicare Other | Admitting: Hematology & Oncology

## 2022-09-07 VITALS — BP 136/64 | HR 62 | Temp 98.3°F | Resp 17 | Ht 64.5 in | Wt 226.0 lb

## 2022-09-07 DIAGNOSIS — D751 Secondary polycythemia: Secondary | ICD-10-CM | POA: Diagnosis not present

## 2022-09-07 DIAGNOSIS — D45 Polycythemia vera: Secondary | ICD-10-CM | POA: Diagnosis present

## 2022-09-07 DIAGNOSIS — Z7982 Long term (current) use of aspirin: Secondary | ICD-10-CM | POA: Insufficient documentation

## 2022-09-07 DIAGNOSIS — D500 Iron deficiency anemia secondary to blood loss (chronic): Secondary | ICD-10-CM

## 2022-09-07 DIAGNOSIS — Z79899 Other long term (current) drug therapy: Secondary | ICD-10-CM | POA: Diagnosis not present

## 2022-09-07 LAB — CBC WITH DIFFERENTIAL (CANCER CENTER ONLY)
Abs Immature Granulocytes: 0.22 10*3/uL — ABNORMAL HIGH (ref 0.00–0.07)
Basophils Absolute: 0.1 10*3/uL (ref 0.0–0.1)
Basophils Relative: 1 %
Eosinophils Absolute: 0.7 10*3/uL — ABNORMAL HIGH (ref 0.0–0.5)
Eosinophils Relative: 5 %
HCT: 43.6 % (ref 36.0–46.0)
Hemoglobin: 12.3 g/dL (ref 12.0–15.0)
Immature Granulocytes: 2 %
Lymphocytes Relative: 14 %
Lymphs Abs: 2.1 10*3/uL (ref 0.7–4.0)
MCH: 18.3 pg — ABNORMAL LOW (ref 26.0–34.0)
MCHC: 28.2 g/dL — ABNORMAL LOW (ref 30.0–36.0)
MCV: 64.9 fL — ABNORMAL LOW (ref 80.0–100.0)
Monocytes Absolute: 0.9 10*3/uL (ref 0.1–1.0)
Monocytes Relative: 6 %
Neutro Abs: 10.6 10*3/uL — ABNORMAL HIGH (ref 1.7–7.7)
Neutrophils Relative %: 72 %
Platelet Count: 500 10*3/uL — ABNORMAL HIGH (ref 150–400)
RBC: 6.72 MIL/uL — ABNORMAL HIGH (ref 3.87–5.11)
RDW: 20.8 % — ABNORMAL HIGH (ref 11.5–15.5)
WBC Count: 14.6 10*3/uL — ABNORMAL HIGH (ref 4.0–10.5)
nRBC: 0 % (ref 0.0–0.2)

## 2022-09-07 LAB — IRON AND IRON BINDING CAPACITY (CC-WL,HP ONLY)
Iron: 24 ug/dL — ABNORMAL LOW (ref 28–170)
Saturation Ratios: 5 % — ABNORMAL LOW (ref 10.4–31.8)
TIBC: 462 ug/dL — ABNORMAL HIGH (ref 250–450)
UIBC: 438 ug/dL (ref 148–442)

## 2022-09-07 LAB — CMP (CANCER CENTER ONLY)
ALT: 17 U/L (ref 0–44)
AST: 16 U/L (ref 15–41)
Albumin: 4.2 g/dL (ref 3.5–5.0)
Alkaline Phosphatase: 77 U/L (ref 38–126)
Anion gap: 10 (ref 5–15)
BUN: 14 mg/dL (ref 8–23)
CO2: 26 mmol/L (ref 22–32)
Calcium: 9.8 mg/dL (ref 8.9–10.3)
Chloride: 106 mmol/L (ref 98–111)
Creatinine: 1.18 mg/dL — ABNORMAL HIGH (ref 0.44–1.00)
GFR, Estimated: 51 mL/min — ABNORMAL LOW (ref >60)
Glucose, Bld: 144 mg/dL — ABNORMAL HIGH (ref 70–99)
Potassium: 4 mmol/L (ref 3.5–5.1)
Sodium: 142 mmol/L (ref 135–145)
Total Bilirubin: 0.7 mg/dL (ref 0.3–1.2)
Total Protein: 6.3 g/dL — ABNORMAL LOW (ref 6.5–8.1)

## 2022-09-07 LAB — LACTATE DEHYDROGENASE: LDH: 267 U/L — ABNORMAL HIGH (ref 98–192)

## 2022-09-07 LAB — FERRITIN: Ferritin: 17 ng/mL (ref 11–307)

## 2022-09-07 NOTE — Progress Notes (Signed)
Hematology and Oncology Follow Up Visit  [name] [id number] 1955-08-20 67 y.o. 09/07/2022   Principle Diagnosis:  Polycythemia vera, JAK 2 positive    Current Therapy:        Phlebotomy as indicated to maintain Hct < 45%            Interim History:  [name] is here today for follow-up. Hct today is stable at 43.6%. WBC count 14.6 and platelets 500. She is doing well and has no complaints at this time.  She has not noted any blood loss. No bruising or petechiae.  No fever, chills, n/v, cough, rash, dizziness, SOB, chest pain, palpitations, abdominal pain or changes in bowel or bladder habits.  No swelling or tenderness in her extremities.   She has intermittent numbness and tingling in her feet.  No falls or syncope.  Appetite and hydration are good. Weight is stable at 226 lbs.   ECOG Performance Status: 1 - Symptomatic but completely ambulatory  Medications:  Allergies as of 09/07/2022       Reactions   Percocet [oxycodone-acetaminophen] Other (See Comments)   SYNCOPE TACHYCARDIA   Shrimp [shellfish Allergy] Anaphylaxis, Itching, Nausea And Vomiting, Swelling   Hydrocodone-acetaminophen Nausea And Vomiting, Other (See Comments)   Dizziness, also   Lipitor [atorvastatin] Other (See Comments)   Ear pain/fullness in ear "as if I were under water"   Saxenda [liraglutide -weight Management] Hives, Itching, Other (See Comments)   Hives/itching/site reaction   Adhesive [tape] Rash, Other (See Comments)   EKG leads and certain bandages break out the skin   Belviq Xr [lorcaserin Hcl Er] Other (See Comments)   Gained weight    Ferrous Sulfate Nausea Only   Morphine And Related Nausea And Vomiting        Medication List        Accurate as of September 07, 2022  9:44 AM. If you have any questions, ask your nurse or doctor.          acetaminophen 325 MG tablet Commonly known as: TYLENOL Take 325-650 mg by mouth every 6 (six) hours as needed for mild pain or  headache.   albuterol 108 (90 Base) MCG/ACT inhaler Commonly known as: VENTOLIN HFA Inhale 2 puffs into the lungs every 6 (six) hours as needed for wheezing or shortness of breath.   Aspirin 81 MG Caps Take 81 mg by mouth daily at 6 (six) AM.   B-12 PO Take 1 tablet by mouth daily.   Breztri Aerosphere 160-9-4.8 MCG/ACT Aero Generic drug: Budeson-Glycopyrrol-Formoterol Inhale into the lungs.   calcium carbonate 500 MG chewable tablet Commonly known as: TUMS - dosed in mg elemental calcium Chew 1 tablet by mouth as needed for indigestion or heartburn.   citalopram 20 MG tablet Commonly known as: CELEXA Take one tablet daily.   dexamethasone 4 MG tablet Commonly known as: DECADRON Take 1 tablet (4 mg total) by mouth 2 (two) times daily with a meal.   gabapentin 100 MG capsule Commonly known as: NEURONTIN One tab PO qHS for a week, then BID for a week, then TID.   ipratropium-albuterol 0.5-2.5 (3) MG/3ML Soln Commonly known as: DUONEB TAKE 3 MLS BY NEBULIZATION EVERY 2 (TWO) HOURS AS NEEDED (WHEEZE, SOB).   levothyroxine 125 MCG tablet Commonly known as: SYNTHROID Take 1 tablet (125 mcg total) by mouth daily before breakfast.   lisinopril 5 MG tablet Commonly known as: ZESTRIL Take 1 tablet (5 mg total) by mouth daily. needs appointment  LUBRICATING EYE DROPS OP Place 1 drop into both eyes as needed (dry eyes).   metoprolol succinate 25 MG 24 hr tablet Commonly known as: TOPROL-XL Take 1 tablet (25 mg total) by mouth daily.   pantoprazole 40 MG tablet Commonly known as: PROTONIX TAKE 1 TABLET BY MOUTH EVERY DAY   promethazine 25 MG tablet Commonly known as: PHENERGAN Take 1 tablet (25 mg total) by mouth as needed.   rosuvastatin 10 MG tablet Commonly known as: CRESTOR Take 1 tablet (10 mg total) by mouth daily.   Vitamin D3 75 MCG (3000 UT) Tabs Take 1,000 Units by mouth daily.        Allergies:  Allergies  Allergen Reactions   Percocet  [Oxycodone-Acetaminophen] Other (See Comments)    SYNCOPE TACHYCARDIA   Shrimp [Shellfish Allergy] Anaphylaxis, Itching, Nausea And Vomiting and Swelling   Hydrocodone-Acetaminophen Nausea And Vomiting and Other (See Comments)    Dizziness, also   Lipitor [Atorvastatin] Other (See Comments)    Ear pain/fullness in ear "as if I were under water"   Saxenda [Liraglutide -Weight Management] Hives, Itching and Other (See Comments)    Hives/itching/site reaction   Adhesive [Tape] Rash and Other (See Comments)    EKG leads and certain bandages break out the skin   Belviq Xr [Lorcaserin Hcl Er] Other (See Comments)    Gained weight    Ferrous Sulfate Nausea Only   Morphine And Related Nausea And Vomiting    Past Medical History, Surgical history, Social history, and Family History were reviewed and updated.  Review of Systems: All other 10 point review of systems is negative.   Physical Exam:  height is 5' 4.5" (1.638 m) and weight is 226 lb (102.5 kg). Her oral temperature is 98.3 F (36.8 C). Her blood pressure is 136/64 and her pulse is 62. Her respiration is 17 and oxygen saturation is 98%.   Wt Readings from Last 3 Encounters:  09/07/22 226 lb (102.5 kg)  08/26/22 223 lb (101.2 kg)  07/13/22 226 lb (102.5 kg)    Ocular: Sclerae unicteric, pupils equal, round and reactive to light Ear-nose-throat: Oropharynx clear, dentition fair Lymphatic: No cervical or supraclavicular adenopathy Lungs no rales or rhonchi, good excursion bilaterally Heart regular rate and rhythm, no murmur appreciated Abd soft, nontender, positive bowel sounds MSK no focal spinal tenderness, no joint edema Neuro: non-focal, well-oriented, appropriate affect Breasts: Deferred   Lab Results  Component Value Date   WBC 14.6 (H) 09/07/2022   HGB 12.3 09/07/2022   HCT 43.6 09/07/2022   MCV 64.9 (L) 09/07/2022   PLT 500 (H) 09/07/2022   Lab Results  Component Value Date   FERRITIN 5 (L) 08/26/2022   IRON  22 (L) 08/26/2022   TIBC 461 (H) 08/26/2022   UIBC 516 (H) 07/13/2022   IRONPCTSAT 5 (L) 08/26/2022   Lab Results  Component Value Date   RETICCTPCT 2.6 12/17/2021   RBC 6.72 (H) 09/07/2022   No results found for: "KPAFRELGTCHN", "LAMBDASER", "KAPLAMBRATIO" No results found for: "IGGSERUM", "IGA", "IGMSERUM" No results found for: "TOTALPROTELP", "ALBUMINELP", "A1GS", "A2GS", "BETS", "BETA2SER", "GAMS", "MSPIKE", "SPEI"   Chemistry      Component Value Date/Time   NA 138 07/13/2022 1306   K 4.7 07/13/2022 1306   CL 102 07/13/2022 1306   CO2 28 07/13/2022 1306   BUN 19 07/13/2022 1306   CREATININE 1.35 (H) 07/13/2022 1306   CREATININE 1.18 (H) 12/11/2021 1221      Component Value Date/Time   CALCIUM 10.4 (  H) 07/13/2022 1306   ALKPHOS 75 07/13/2022 1306   AST 24 07/13/2022 1306   ALT 23 07/13/2022 1306   BILITOT 0.9 07/13/2022 1306       Impression and Plan: [name] is a very pleasant 67 yo caucasian female with polycythemia, JAK 2 positive. No phlebotomy needed at this time. Hct 43.6%.  Lab only in 6 weeks.  Follow-up with MD in 3 months.   Lottie Dawson, NP 1/29/20249:44 AM

## 2022-09-08 ENCOUNTER — Other Ambulatory Visit: Payer: Self-pay | Admitting: Physician Assistant

## 2022-09-08 DIAGNOSIS — I10 Essential (primary) hypertension: Secondary | ICD-10-CM

## 2022-09-14 ENCOUNTER — Other Ambulatory Visit: Payer: Self-pay | Admitting: Physician Assistant

## 2022-09-14 DIAGNOSIS — K2100 Gastro-esophageal reflux disease with esophagitis, without bleeding: Secondary | ICD-10-CM

## 2022-09-18 ENCOUNTER — Encounter: Payer: Self-pay | Admitting: Physician Assistant

## 2022-09-18 ENCOUNTER — Other Ambulatory Visit: Payer: Self-pay | Admitting: Physician Assistant

## 2022-09-18 DIAGNOSIS — K2100 Gastro-esophageal reflux disease with esophagitis, without bleeding: Secondary | ICD-10-CM

## 2022-09-19 ENCOUNTER — Other Ambulatory Visit: Payer: Self-pay | Admitting: Physician Assistant

## 2022-09-19 DIAGNOSIS — I493 Ventricular premature depolarization: Secondary | ICD-10-CM

## 2022-09-21 MED ORDER — METOPROLOL SUCCINATE ER 25 MG PO TB24: 25.0000 mg | ORAL_TABLET | Freq: Every day | ORAL | 0 refills | Status: DC

## 2022-09-23 ENCOUNTER — Other Ambulatory Visit: Payer: Self-pay | Admitting: Physician Assistant

## 2022-09-23 DIAGNOSIS — E039 Hypothyroidism, unspecified: Secondary | ICD-10-CM

## 2022-09-25 ENCOUNTER — Encounter: Payer: Self-pay | Admitting: Family Medicine

## 2022-09-25 ENCOUNTER — Ambulatory Visit (INDEPENDENT_AMBULATORY_CARE_PROVIDER_SITE_OTHER): Payer: Medicare Other | Admitting: Family Medicine

## 2022-09-25 VITALS — BP 125/50 | HR 69 | Temp 99.3°F | Ht 64.5 in | Wt 224.0 lb

## 2022-09-25 DIAGNOSIS — J329 Chronic sinusitis, unspecified: Secondary | ICD-10-CM

## 2022-09-25 DIAGNOSIS — J40 Bronchitis, not specified as acute or chronic: Secondary | ICD-10-CM

## 2022-09-25 MED ORDER — AMOXICILLIN-POT CLAVULANATE 875-125 MG PO TABS: 1.0000 | ORAL_TABLET | Freq: Two times a day (BID) | ORAL | 0 refills | Status: DC

## 2022-09-25 NOTE — Progress Notes (Signed)
   Acute Office Visit  Subjective:     Patient ID: [name], female    DOB: 01-29-1956, 67 y.o.   MRN: [id number]  Chief Complaint  Patient presents with   Cough   Wheezing    HPI Patient is in today for Cough and wheezing for 2 weeks.  She says she was originally sick about 3 weeks ago with what she was pretty sure was the flu she had about 102 temperature at that time she did get better but then 2 weeks ago started feeling bad again with runny nose and nasal congestion and sneezing.  Is coming in today because now she has developed a cough this week and is coughing up green sputum.  Using cough medication. Temp was 100 last night.  Some SOB with exertion.  Noticed a red spot under her chin this morning.  Does not remember any injury or trauma.  No other rash elsewhere..    ROS      Objective:    BP (!) 125/50   Pulse 69   Temp 99.3 F (37.4 C)   Ht 5' 4.5" (1.638 m)   Wt 224 lb (101.6 kg)   SpO2 97%   BMI 37.86 kg/m    Physical Exam Constitutional:      Appearance: She is well-developed.  HENT:     Head: Normocephalic and atraumatic.     Right Ear: External ear normal.     Left Ear: External ear normal.     Nose: Nose normal.  Eyes:     Conjunctiva/sclera: Conjunctivae normal.     Pupils: Pupils are equal, round, and reactive to light.  Neck:     Thyroid: No thyromegaly.  Cardiovascular:     Rate and Rhythm: Normal rate and regular rhythm.     Heart sounds: Normal heart sounds.  Pulmonary:     Effort: Pulmonary effort is normal.     Breath sounds: Normal breath sounds. No wheezing.  Musculoskeletal:     Cervical back: Neck supple.  Lymphadenopathy:     Cervical: No cervical adenopathy.  Skin:    General: Skin is warm and dry.  Neurological:     Mental Status: She is alert and oriented to person, place, and time.     No results found for any visits on 09/25/22.      Assessment & Plan:   Problem List Items Addressed This Visit        Respiratory   Sinobronchitis - Primary   Relevant Medications   amoxicillin-clavulanate (AUGMENTIN) 875-125 MG tablet   Bronchitis-sounds like she has had some back-to-back illnesses.  But this time it has been 2 weeks and she is not getting better.  Will go ahead and treat with Augmentin for 1 week if not better please let us know.  Please use albuterol liberally.  Meds ordered this encounter  Medications   amoxicillin-clavulanate (AUGMENTIN) 875-125 MG tablet    Sig: Take 1 tablet by mouth 2 (two) times daily.    Dispense:  14 tablet    Refill:  0    Return if symptoms worsen or fail to improve.  Beatrice Lecher, MD

## 2022-09-25 NOTE — Progress Notes (Signed)
Pt reports that this has been going on x2 weeks. She stated that her cough is productive she has been taking a cough syrup that she was given by Iran Planas PA-C.   Took Covid test last night and Tuesday both were negative.

## 2022-10-19 ENCOUNTER — Inpatient Hospital Stay: Payer: Medicare Other

## 2022-10-19 ENCOUNTER — Inpatient Hospital Stay: Payer: Medicare Other | Attending: Hematology & Oncology

## 2022-10-19 DIAGNOSIS — D751 Secondary polycythemia: Secondary | ICD-10-CM

## 2022-10-19 DIAGNOSIS — D45 Polycythemia vera: Secondary | ICD-10-CM | POA: Insufficient documentation

## 2022-10-19 DIAGNOSIS — D500 Iron deficiency anemia secondary to blood loss (chronic): Secondary | ICD-10-CM

## 2022-10-19 LAB — CBC WITH DIFFERENTIAL (CANCER CENTER ONLY)
Abs Immature Granulocytes: 0.07 10*3/uL (ref 0.00–0.07)
Basophils Absolute: 0.1 10*3/uL (ref 0.0–0.1)
Basophils Relative: 1 %
Eosinophils Absolute: 0.7 10*3/uL — ABNORMAL HIGH (ref 0.0–0.5)
Eosinophils Relative: 6 %
HCT: 44.8 % (ref 36.0–46.0)
Hemoglobin: 12.5 g/dL (ref 12.0–15.0)
Immature Granulocytes: 1 %
Lymphocytes Relative: 19 %
Lymphs Abs: 2.4 10*3/uL (ref 0.7–4.0)
MCH: 18.1 pg — ABNORMAL LOW (ref 26.0–34.0)
MCHC: 27.9 g/dL — ABNORMAL LOW (ref 30.0–36.0)
MCV: 64.9 fL — ABNORMAL LOW (ref 80.0–100.0)
Monocytes Absolute: 0.8 10*3/uL (ref 0.1–1.0)
Monocytes Relative: 6 %
Neutro Abs: 8.6 10*3/uL — ABNORMAL HIGH (ref 1.7–7.7)
Neutrophils Relative %: 67 %
Platelet Count: 541 10*3/uL — ABNORMAL HIGH (ref 150–400)
RBC: 6.9 MIL/uL — ABNORMAL HIGH (ref 3.87–5.11)
RDW: 21.9 % — ABNORMAL HIGH (ref 11.5–15.5)
WBC Count: 12.7 10*3/uL — ABNORMAL HIGH (ref 4.0–10.5)
nRBC: 0 % (ref 0.0–0.2)

## 2022-10-19 LAB — CMP (CANCER CENTER ONLY)
ALT: 28 U/L (ref 0–44)
AST: 23 U/L (ref 15–41)
Albumin: 4.5 g/dL (ref 3.5–5.0)
Alkaline Phosphatase: 79 U/L (ref 38–126)
Anion gap: 10 (ref 5–15)
BUN: 16 mg/dL (ref 8–23)
CO2: 27 mmol/L (ref 22–32)
Calcium: 10.2 mg/dL (ref 8.9–10.3)
Chloride: 104 mmol/L (ref 98–111)
Creatinine: 1.32 mg/dL — ABNORMAL HIGH (ref 0.44–1.00)
GFR, Estimated: 44 mL/min — ABNORMAL LOW (ref >60)
Glucose, Bld: 146 mg/dL — ABNORMAL HIGH (ref 70–99)
Potassium: 4.3 mmol/L (ref 3.5–5.1)
Sodium: 141 mmol/L (ref 135–145)
Total Bilirubin: 0.7 mg/dL (ref 0.3–1.2)
Total Protein: 6.9 g/dL (ref 6.5–8.1)

## 2022-10-19 LAB — IRON AND IRON BINDING CAPACITY (CC-WL,HP ONLY)
Iron: 26 ug/dL — ABNORMAL LOW (ref 28–170)
Saturation Ratios: 5 % — ABNORMAL LOW (ref 10.4–31.8)
TIBC: 529 ug/dL — ABNORMAL HIGH (ref 250–450)
UIBC: 503 ug/dL — ABNORMAL HIGH (ref 148–442)

## 2022-10-19 LAB — FERRITIN: Ferritin: 8 ng/mL — ABNORMAL LOW (ref 11–307)

## 2022-10-29 ENCOUNTER — Telehealth: Payer: Self-pay | Admitting: Physician Assistant

## 2022-10-29 NOTE — Telephone Encounter (Signed)
Contacted [name] to schedule their annual wellness visit. Appointment made for 11/24/22 at [place] [facility] Direct Dial: [phone]

## 2022-11-24 ENCOUNTER — Ambulatory Visit (INDEPENDENT_AMBULATORY_CARE_PROVIDER_SITE_OTHER): Payer: Medicare Other | Admitting: Physician Assistant

## 2022-11-24 DIAGNOSIS — Z0000 Encounter for general adult medical examination without abnormal findings: Secondary | ICD-10-CM

## 2022-11-24 DIAGNOSIS — Z780 Asymptomatic menopausal state: Secondary | ICD-10-CM

## 2022-11-24 NOTE — Patient Instructions (Addendum)
MEDICARE ANNUAL WELLNESS VISIT Health Maintenance Summary and Written Plan of Care  [name] ,  Thank you for allowing me to perform your Medicare Annual Wellness Visit and for your ongoing commitment to your health.   Health Maintenance & Immunization History Health Maintenance  Topic Date Due   COVID-19 Vaccine (4 - 2023-24 season) 12/10/2022 (Originally 04/10/2022)   INFLUENZA VACCINE  03/11/2023   COLONOSCOPY (Pts 45-71yrs Insurance coverage will need to be confirmed)  05/13/2023   Medicare Annual Wellness (AWV)  11/24/2023   MAMMOGRAM  06/17/2024   Pneumonia Vaccine 32+ Years old  Completed   DEXA SCAN  Completed   Hepatitis C Screening  Completed   Zoster Vaccines- Shingrix  Completed   HPV VACCINES  Aged Out   DTaP/Tdap/Td  Discontinued   Immunization History  Administered Date(s) Administered   Fluad Quad(high Dose 65+) 06/05/2022   Influenza Whole 08/14/1999   Influenza-Unspecified 04/10/2016, 05/16/2018   PFIZER(Purple Top)SARS-COV-2 Vaccination 08/02/2019, 08/23/2019, 04/29/2020   Pneumococcal Conjugate-13 01/25/2014   Pneumococcal Polysaccharide-23 04/11/2020   Tdap 08/11/2003, 09/28/2011   Zoster Recombinat (Shingrix) 06/16/2017, 12/13/2017   Zoster, Live 05/24/2012    These are the patient goals that we discussed:  Goals Addressed               This Visit's Progress     Patient Stated (pt-stated)        Patient stated that she would like to start walking daily.         This is a list of Health Maintenance Items that are overdue or due now: Follow-up with Jomarie Longs, PA-C as planned Medicare wellness visit in one year.  Patient will access AVS on my chart.There are no preventive care reminders to display for this patient.    Orders/Referrals Placed Today: Orders Placed This Encounter  Procedures   DEXAScan    Standing Status:   Future    Standing Expiration Date:   11/24/2023    Scheduling Instructions:     Please call patient to  schedule.    Order Specific Question:   Reason for exam:    Answer:   post menopausal    Order Specific Question:   Preferred imaging location?    Answer:   [facility]   (Contact our referral department at [phone] if you have not spoken with someone about your referral appointment within the next 5 days)    Follow-up Plan Bone densitometry screening Colorectal cancer screening- Due in October Patient stated that she will schedule the colonoscopy.       Health Maintenance, Female Adopting a healthy lifestyle and getting preventive care are important in promoting health and wellness. Ask your health care provider about: The right schedule for you to have regular tests and exams. Things you can do on your own to prevent diseases and keep yourself healthy. What should I know about diet, weight, and exercise? Eat a healthy diet  Eat a diet that includes plenty of vegetables, fruits, low-fat dairy products, and lean protein. Do not eat a lot of foods that are high in solid fats, added sugars, or sodium. Maintain a healthy weight Body mass index (BMI) is used to identify weight problems. It estimates body fat based on height and weight. Your health care provider can help determine your BMI and help you achieve or maintain a healthy weight. Get regular exercise Get regular exercise. This is one of the most important things you can do for your health. Most adults should:  Exercise for at least 150 minutes each week. The exercise should increase your heart rate and make you sweat (moderate-intensity exercise). Do strengthening exercises at least twice a week. This is in addition to the moderate-intensity exercise. Spend less time sitting. Even light physical activity can be beneficial. Watch cholesterol and blood lipids Have your blood tested for lipids and cholesterol at 68 years of age, then have this test every 5 years. Have your cholesterol levels checked more often  if: Your lipid or cholesterol levels are high. You are older than 68 years of age. You are at high risk for heart disease. What should I know about cancer screening? Depending on your health history and family history, you may need to have cancer screening at various ages. This may include screening for: Breast cancer. Cervical cancer. Colorectal cancer. Skin cancer. Lung cancer. What should I know about heart disease, diabetes, and high blood pressure? Blood pressure and heart disease High blood pressure causes heart disease and increases the risk of stroke. This is more likely to develop in people who have high blood pressure readings or are overweight. Have your blood pressure checked: Every 3-5 years if you are 45-15 years of age. Every year if you are 26 years old or older. Diabetes Have regular diabetes screenings. This checks your fasting blood sugar level. Have the screening done: Once every three years after age 22 if you are at a normal weight and have a low risk for diabetes. More often and at a younger age if you are overweight or have a high risk for diabetes. What should I know about preventing infection? Hepatitis B If you have a higher risk for hepatitis B, you should be screened for this virus. Talk with your health care provider to find out if you are at risk for hepatitis B infection. Hepatitis C Testing is recommended for: Everyone born from [birth year] through 1966. Anyone with known risk factors for hepatitis C. Sexually transmitted infections (STIs) Get screened for STIs, including gonorrhea and chlamydia, if: You are sexually active and are younger than 68 years of age. You are older than 68 years of age and your health care provider tells you that you are at risk for this type of infection. Your sexual activity has changed since you were last screened, and you are at increased risk for chlamydia or gonorrhea. Ask your health care provider if you are at risk. Ask  your health care provider about whether you are at high risk for HIV. Your health care provider may recommend a prescription medicine to help prevent HIV infection. If you choose to take medicine to prevent HIV, you should first get tested for HIV. You should then be tested every 3 months for as long as you are taking the medicine. Pregnancy If you are about to stop having your period (premenopausal) and you may become pregnant, seek counseling before you get pregnant. Take 400 to 800 micrograms (mcg) of folic acid every day if you become pregnant. Ask for birth control (contraception) if you want to prevent pregnancy. Osteoporosis and menopause Osteoporosis is a disease in which the bones lose minerals and strength with aging. This can result in bone fractures. If you are 50 years old or older, or if you are at risk for osteoporosis and fractures, ask your health care provider if you should: Be screened for bone loss. Take a calcium or vitamin D supplement to lower your risk of fractures. Be given hormone replacement therapy (HRT) to treat symptoms  of menopause. Follow these instructions at home: Alcohol use Do not drink alcohol if: Your health care provider tells you not to drink. You are pregnant, may be pregnant, or are planning to become pregnant. If you drink alcohol: Limit how much you have to: 0-1 drink a day. Know how much alcohol is in your drink. In the [place] one drink equals one 12 oz bottle of beer (355 mL), one 5 oz glass of wine (148 mL), or one 1 oz glass of hard liquor (44 mL). Lifestyle Do not use any products that contain nicotine or tobacco. These products include cigarettes, chewing tobacco, and vaping devices, such as e-cigarettes. If you need help quitting, ask your health care provider. Do not use street drugs. Do not share needles. Ask your health care provider for help if you need support or information about quitting drugs. General instructions Schedule regular  health, dental, and eye exams. Stay current with your vaccines. Tell your health care provider if: You often feel depressed. You have ever been abused or do not feel safe at home. Summary Adopting a healthy lifestyle and getting preventive care are important in promoting health and wellness. Follow your health care provider's instructions about healthy diet, exercising, and getting tested or screened for diseases. Follow your health care provider's instructions on monitoring your cholesterol and blood pressure. This information is not intended to replace advice given to you by your health care provider. Make sure you discuss any questions you have with your health care provider. Document Revised: 12/16/2020 Document Reviewed: 12/16/2020 Elsevier Patient Education  2023 [employer].

## 2022-11-24 NOTE — Progress Notes (Signed)
MEDICARE ANNUAL WELLNESS VISIT  11/24/2022  Telephone Visit Disclaimer This Medicare AWV was conducted by telephone due to national recommendations for restrictions regarding the COVID-19 Pandemic (e.g. social distancing).  I verified, using two identifiers, that I am speaking with [name] or their authorized healthcare agent. I discussed the limitations, risks, security, and privacy concerns of performing an evaluation and management service by telephone and the potential availability of an in-person appointment in the future. The patient expressed understanding and agreed to proceed.  Location of Patient: Home Location of Provider (nurse):  In the office.  Subjective:    [name] is a 67 y.o. female patient of Caleen Essex, Lonna Cobb, PA-C who had a Medicare Annual Wellness Visit today via telephone. [name] is Retired and lives with their spouse. she has 7 children. she reports that she is socially active and does interact with friends/family regularly. she is minimally physically active and enjoys quilting and reading.  Patient Care Team: Nolene Ebbs as PCP - General (Family Medicine) Jake Bathe, MD as PCP - Cardiology (Cardiology) Suzi Roots as Physician Assistant (Dermatology)     11/24/2022    9:41 AM 09/07/2022    9:31 AM 07/13/2022    1:18 PM 05/12/2022    1:55 PM 03/11/2022   11:01 AM 01/16/2022   10:11 AM 01/09/2022    1:07 PM  Advanced Directives  Does Patient Have a Medical Advance Directive? No Yes Yes Yes Yes Yes No  Type of Special educational needs teacher of [place];Living will Healthcare Power of [place];Living will Healthcare Power of [place];Living will Living will;Healthcare Power of Attorney Living will;Healthcare Power of Attorney   Does patient want to make changes to medical advance directive?    No - Patient declined  No - Patient declined   Copy of Healthcare Power of Attorney in Chart?  Yes - validated most recent copy  scanned in chart (See row information) Yes - validated most recent copy scanned in chart (See row information) Yes - validated most recent copy scanned in chart (See row information) Yes - validated most recent copy scanned in chart (See row information) Yes - validated most recent copy scanned in chart (See row information)   Would patient like information on creating a medical advance directive? Yes (MAU/Ambulatory/Procedural Areas - Information given) No - Patient declined No - Patient declined  No - Patient declined No - Patient declined No - Patient declined    Hospital Utilization Over the Past 12 Months: # of hospitalizations or ER visits: 1 # of surgeries: 0  Review of Systems    Patient reports that her overall health is unchanged compared to last year.  History obtained from chart review and the patient  Patient Reported Readings (BP, Pulse, CBG, Weight, etc) none  Pain Assessment Pain : No/denies pain     Current Medications & Allergies (verified) Allergies as of 11/24/2022       Reactions   Percocet [oxycodone-acetaminophen] Other (See Comments)   SYNCOPE TACHYCARDIA   Shrimp [shellfish Allergy] Anaphylaxis, Itching, Nausea And Vomiting, Swelling   Hydrocodone-acetaminophen Nausea And Vomiting, Other (See Comments)   Dizziness, also   Lipitor [atorvastatin] Other (See Comments)   Ear pain/fullness in ear "as if I were under water"   Saxenda [liraglutide -weight Management] Hives, Itching, Other (See Comments)   Hives/itching/site reaction   Adhesive [tape] Rash, Other (See Comments)   EKG leads and certain bandages break out the skin  Belviq Xr [lorcaserin Hcl Er] Other (See Comments)   Gained weight    Ferrous Sulfate Nausea Only   Morphine And Related Nausea And Vomiting        Medication List        Accurate as of November 24, 2022  9:52 AM. If you have any questions, ask your nurse or doctor.          acetaminophen 325 MG tablet Commonly known as:  TYLENOL Take 325-650 mg by mouth every 6 (six) hours as needed for mild pain or headache.   albuterol 108 (90 Base) MCG/ACT inhaler Commonly known as: VENTOLIN HFA Inhale 2 puffs into the lungs every 6 (six) hours as needed for wheezing or shortness of breath.   amoxicillin-clavulanate 875-125 MG tablet Commonly known as: AUGMENTIN Take 1 tablet by mouth 2 (two) times daily.   Aspirin 81 MG Caps Take 81 mg by mouth daily at 6 (six) AM.   B-12 PO Take 1 tablet by mouth daily.   calcium carbonate 500 MG chewable tablet Commonly known as: TUMS - dosed in mg elemental calcium Chew 1 tablet by mouth as needed for indigestion or heartburn.   citalopram 20 MG tablet Commonly known as: CELEXA Take one tablet daily. What changed:  how much to take how to take this when to take this   levothyroxine 125 MCG tablet Commonly known as: SYNTHROID TAKE 1 TABLET BY MOUTH DAILY BEFORE BREAKFAST.   lisinopril 5 MG tablet Commonly known as: ZESTRIL Take 1 tablet (5 mg total) by mouth daily.   LUBRICATING EYE DROPS OP Place 1 drop into both eyes as needed (dry eyes).   metoprolol succinate 25 MG 24 hr tablet Commonly known as: TOPROL-XL Take 1 tablet (25 mg total) by mouth daily.   pantoprazole 40 MG tablet Commonly known as: PROTONIX TAKE 1 TABLET BY MOUTH EVERY DAY   promethazine 25 MG tablet Commonly known as: PHENERGAN Take 1 tablet (25 mg total) by mouth as needed.   rosuvastatin 10 MG tablet Commonly known as: CRESTOR Take 1 tablet (10 mg total) by mouth daily.   Vitamin D3 75 MCG (3000 UT) Tabs Take 1,000 Units by mouth daily.        History (reviewed): Past Medical History:  Diagnosis Date   Acute medial meniscal tear    Right knee   Allergy    Anxiety    Aortic insufficiency    Mild   Aortic regurgitation    Arthritis    Asthma    Cancer May 2023   Polycythemia Vera   Carpal tunnel syndrome    Cataract 2022   Chronic kidney disease    one kidney     Dysrhythmia    pvc's at times   GERD (gastroesophageal reflux disease)    Heart murmur 1985   MVP   Hyperlipidemia    Hypertension 2015   Hypothyroidism    Migraine headache    Mitral regurgitation    Mitral valve prolapse    Nausea alone    Need for SBE (subacute bacterial endocarditis) prophylaxis    Pilonidal cyst without mention of abscess    Pneumonia 08/14/2012   PONV (postoperative nausea and vomiting)    states she vasovagals   Sleep apnea    wears CPAP   Thyroid disease    Transplanted kidney removed    donated kidney   Past Surgical History:  Procedure Laterality Date   ABDOMINAL HYSTERECTOMY     ANTERIOR CERVICAL DECOMP/DISCECTOMY FUSION  05/28/2020   CARPAL TUNNEL RELEASE Right    CARPAL TUNNEL RELEASE Left 02/25/2017   Procedure: LEFT OPEN CARPAL TUNNEL RELEASE;  Surgeon: Kathryne Hitch, MD;  Location: [facility] OR;  Service: Orthopedics;  Laterality: Left;   CARPAL TUNNEL RELEASE Left 1998   CARPAL TUNNEL RELEASE Right 2019   COLONOSCOPY     JOINT REPLACEMENT  2015   RTKA   KNEE ARTHROSCOPY Right 01/06/2018   Procedure: RIGHT KNEE ARTHROSCOPY WITH PARTIAL  MEDIAL MENISCECTOMY;  Surgeon: Kathryne Hitch, MD;  Location: [facility] OR;  Service: Orthopedics;  Laterality: Right;   KNEE SURGERY     LARYNGOSCOPY N/A 05/30/2020   Procedure: LARYNGOSCOPY;  Surgeon: Newman Pies, MD;  Location: [facility] OR;  Service: ENT;  Laterality: N/A;   NEPHRECTOMY     PARTIAL KNEE ARTHROPLASTY Right 03/31/2018   Procedure: RIGHT KNEE UNI ARTHROPLASTY;  Surgeon: Teryl Lucy, MD;  Location: [facility];  Service: Orthopedics;  Laterality: Right;   SPINE SURGERY  Oct 2021   ACDF   TONSILLECTOMY     TOTAL KNEE ARTHROPLASTY Left 09/14/2013   Procedure: LEFT TOTAL KNEE ARTHROPLASTY;  Surgeon: Kathryne Hitch, MD;  Location: [facility] OR;  Service: Orthopedics;  Laterality: Left;   TUBAL LIGATION     WISDOM TOOTH EXTRACTION     WOUND EXPLORATION N/A 05/28/2020    Procedure: ANTERIOR CERVICAL WOUND EXPLORATION;  Surgeon: Maeola Harman, MD;  Location: [facility] OR;  Service: Neurosurgery;  Laterality: N/A;   Family History  Problem Relation Age of Onset   Diabetes Mother    Hypertension Mother    Heart disease Father    Hypertension Father    Thyroid disease Father    AAA (abdominal aortic aneurysm) Father    Arthritis Father    Cancer Maternal Aunt    Cancer Maternal Aunt    Diabetes Sister    Fibromyalgia Sister    Obstructive Sleep Apnea Sister    Arthritis Sister    Asthma Sister    Hypertension Sister    Miscarriages / Stillbirths Sister    Obesity Sister    Cancer Sister    Kidney disease Daughter    Hearing loss Daughter    Heart disease Son    Stroke Paternal Grandmother    Obesity Daughter    Colon cancer Neg Hx    Esophageal cancer Neg Hx    Rectal cancer Neg Hx    Stomach cancer Neg Hx    Social History   Socioeconomic History   Marital status: Married    Spouse name: Tom   Number of children: 7   Years of education: 18   Highest education level: Manufacturing engineer (e.g., MA, MS, MEng, MEd, MSW, MBA)  Occupational History    Employer: [employer]   Occupation: Retired  Tobacco Use   Smoking status: Former    Packs/day: 0.25    Years: 5.00    Additional pack years: 0.00    Total pack years: 1.25    Types: Cigarettes    Quit date: 11/22/1983    Years since quitting: 39.0   Smokeless tobacco: Never   Tobacco comments:    Did not smoke daily  Vaping Use   Vaping Use: Never used  Substance and Sexual Activity   Alcohol use: Yes    Comment: Rare maybe once a month   Drug use: No   Sexual activity: Yes    Partners: Male    Birth control/protection: Post-menopausal  Other Topics Concern  Not on file  Social History Narrative   Lives with spouse. She has 7 children. She enjoys quilting and reading.   Social Determinants of Health   Financial Resource Strain: Low Risk  (11/23/2022)   Overall Financial Resource  Strain (CARDIA)    Difficulty of Paying Living Expenses: Not very hard  Food Insecurity: No Food Insecurity (11/23/2022)   Hunger Vital Sign    Worried About Running Out of Food in the Last Year: Never true    Ran Out of Food in the Last Year: Never true  Transportation Needs: No Transportation Needs (11/23/2022)   PRAPARE - Administrator, Civil Service (Medical): No    Lack of Transportation (Non-Medical): No  Physical Activity: Insufficiently Active (11/23/2022)   Exercise Vital Sign    Days of Exercise per Week: 2 days    Minutes of Exercise per Session: 20 min  Stress: No Stress Concern Present (11/23/2022)   [name]    Feeling of Stress : Not at all  Social Connections: Moderately Integrated (11/24/2022)   Social Connection and Isolation Panel [NHANES]    Frequency of Communication with Friends and Family: More than three times a week    Frequency of Social Gatherings with Friends and Family: Three times a week    Attends Religious Services: 1 to 4 times per year    Active Member of Clubs or Organizations: No    Attends Banker Meetings: Never    Marital Status: Married    Activities of Daily Living    11/24/2022    9:42 AM 11/23/2022   11:15 AM  In your present state of health, do you have any difficulty performing the following activities:  Hearing?  0  Vision?  0  Difficulty concentrating or making decisions?  0  Walking or climbing stairs? 0   Comment Takes her time and goes slowly.   Dressing or bathing?  0  Doing errands, shopping?  0  Preparing Food and eating ?  N  Using the Toilet?  N  In the past six months, have you accidently leaked urine?  Y  Do you have problems with loss of bowel control?  N  Managing your Medications?  N  Managing your Finances?  N  Housekeeping or managing your Housekeeping?  N    Patient Education/ Literacy How often do you need to have  someone help you when you read instructions, pamphlets, or other written materials from your doctor or pharmacy?: 1 - Never What is the last grade level you completed in school?: Masters degree  Exercise Current Exercise Habits: Home exercise routine, Type of exercise: Other - see comments (yardwork), Time (Minutes): 20, Frequency (Times/Week): 2, Weekly Exercise (Minutes/Week): 40, Intensity: Moderate, Exercise limited by: None identified  Diet Patient reports consuming 2 meals a day and 1 snack(s) a day Patient reports that her primary diet is: Regular Patient reports that she does have regular access to food.   Depression Screen    11/24/2022    9:40 AM 08/26/2022   11:18 AM 05/06/2022    2:09 PM 11/13/2021    1:11 PM 03/07/2021   10:17 AM 03/04/2021    1:24 PM 10/14/2020    9:18 AM  PHQ 2/9 Scores  PHQ - 2 Score 0 1 0 0 0 0 0  PHQ- 9 Score       0     Fall Risk    11/24/2022  9:40 AM 11/23/2022   11:15 AM 08/26/2022   11:18 AM 05/06/2022    2:09 PM 11/13/2021    1:11 PM  Fall Risk   Falls in the past year? 1 1 0 0 1  Number falls in past yr: 0 0 0 0 0  Injury with Fall? 1 0 0 0 0  Risk for fall due to : History of fall(s)  No Fall Risks No Fall Risks No Fall Risks  Follow up Falls evaluation completed;Education provided;Falls prevention discussed  Falls evaluation completed Falls evaluation completed Falls prevention discussed;Falls evaluation completed     Objective:  [name] seemed alert and oriented and she participated appropriately during our telephone visit.  Blood Pressure Weight BMI  BP Readings from Last 3 Encounters:  09/25/22 (!) 125/50  09/07/22 136/64  08/26/22 (!) 131/53   Wt Readings from Last 3 Encounters:  09/25/22 224 lb (101.6 kg)  09/07/22 226 lb (102.5 kg)  08/26/22 223 lb (101.2 kg)   BMI Readings from Last 1 Encounters:  09/25/22 37.86 kg/m    *Unable to obtain current vital signs, weight, and BMI due to telephone visit  type  Hearing/Vision  [name] did not seem to have difficulty with hearing/understanding during the telephone conversation Reports that she has had a formal eye exam by an eye care professional within the past year Reports that she has not had a formal hearing evaluation within the past year *Unable to fully assess hearing and vision during telephone visit type  Cognitive Function:    11/24/2022    9:46 AM 03/04/2021    1:31 PM  6CIT Screen  What Year? 0 points 0 points  What month? 0 points 0 points  What time? 0 points 0 points  Count back from 20 0 points 0 points  Months in reverse 0 points 0 points  Repeat phrase 0 points 0 points  Total Score 0 points 0 points   (Normal:0-7, Significant for Dysfunction: >8)  Normal Cognitive Function Screening: Yes   Immunization & Health Maintenance Record Immunization History  Administered Date(s) Administered   Fluad Quad(high Dose 65+) 06/05/2022   Influenza Whole 08/14/1999   Influenza-Unspecified 04/10/2016, 05/16/2018   PFIZER(Purple Top)SARS-COV-2 Vaccination 08/02/2019, 08/23/2019, 04/29/2020   Pneumococcal Conjugate-13 01/25/2014   Pneumococcal Polysaccharide-23 04/11/2020   Tdap 08/11/2003, 09/28/2011   Zoster Recombinat (Shingrix) 06/16/2017, 12/13/2017   Zoster, Live 05/24/2012    Health Maintenance  Topic Date Due   COVID-19 Vaccine (4 - 2023-24 season) 12/10/2022 (Originally 04/10/2022)   INFLUENZA VACCINE  03/11/2023   COLONOSCOPY (Pts 45-17yrs Insurance coverage will need to be confirmed)  05/13/2023   Medicare Annual Wellness (AWV)  11/24/2023   MAMMOGRAM  06/17/2024   Pneumonia Vaccine 78+ Years old  Completed   DEXA SCAN  Completed   Hepatitis C Screening  Completed   Zoster Vaccines- Shingrix  Completed   HPV VACCINES  Aged Out   DTaP/Tdap/Td  Discontinued       Assessment  This is a routine wellness examination for [employer].  Health Maintenance: Due or Overdue There are no preventive care  reminders to display for this patient.   [name] does not need a referral for [employer] Assistance: Care Management:   no Social Work:    no Prescription Assistance:  no Nutrition/Diabetes Education:  no   Plan:  Personalized Goals  Goals Addressed               This Visit's Progress  Patient Stated (pt-stated)        Patient stated that she would like to start walking daily.       Personalized Health Maintenance & Screening Recommendations  Bone densitometry screening Colorectal cancer screening- Due in October Patient stated that she will schedule the colonoscopy.   Lung Cancer Screening Recommended: no (Low Dose CT Chest recommended if Age 68-80 years, 20 pack-year currently smoking OR have quit w/in past 15 years) Hepatitis C Screening recommended: no HIV Screening recommended: no  Advanced Directives: Written information was not prepared per patient's request.  Referrals & Orders Orders Placed This Encounter  Procedures   DEXAScan    Follow-up Plan Follow-up with Jomarie Longs, PA-C as planned Medicare wellness visit in one year.  Patient will access AVS on my chart.   I have personally reviewed and noted the following in the patient's chart:   Medical and social history Use of alcohol, tobacco or illicit drugs  Current medications and supplements Functional ability and status Nutritional status Physical activity Advanced directives List of other physicians Hospitalizations, surgeries, and ER visits in previous 12 months Vitals Screenings to include cognitive, depression, and falls Referrals and appointments  In addition, I have reviewed and discussed with [name] certain preventive protocols, quality metrics, and best practice recommendations. A written personalized care plan for preventive services as well as general preventive health recommendations is available and can be mailed to the patient at her request.       Modesto Charon, RN BSN  11/24/2022

## 2022-11-25 ENCOUNTER — Ambulatory Visit: Payer: Medicare Other | Admitting: Physician Assistant

## 2022-12-04 ENCOUNTER — Encounter: Payer: Self-pay | Admitting: Physician Assistant

## 2022-12-04 ENCOUNTER — Ambulatory Visit (INDEPENDENT_AMBULATORY_CARE_PROVIDER_SITE_OTHER): Payer: Medicare Other | Admitting: Physician Assistant

## 2022-12-04 VITALS — BP 136/58 | HR 54 | Ht 64.5 in | Wt 227.0 lb

## 2022-12-04 DIAGNOSIS — I341 Nonrheumatic mitral (valve) prolapse: Secondary | ICD-10-CM | POA: Diagnosis not present

## 2022-12-04 DIAGNOSIS — G4733 Obstructive sleep apnea (adult) (pediatric): Secondary | ICD-10-CM

## 2022-12-04 DIAGNOSIS — I10 Essential (primary) hypertension: Secondary | ICD-10-CM

## 2022-12-04 DIAGNOSIS — I493 Ventricular premature depolarization: Secondary | ICD-10-CM

## 2022-12-04 MED ORDER — METOPROLOL SUCCINATE ER 25 MG PO TB24: 25.0000 mg | ORAL_TABLET | Freq: Every day | ORAL | 0 refills | Status: DC

## 2022-12-04 NOTE — Progress Notes (Signed)
Established Patient Office Visit  Subjective   Patient ID: [name], female    DOB: 04-11-1956  Age: 67 y.o. MRN: [id number]  Chief Complaint  Patient presents with   Follow-up         HPI Pt is a 67 yo obese female with OSA, PVC, MVP, HTN, hypothyroidism who presents to the clinic for follow up.   Pt is doing really good. She denies any PVCs, CP, palpitations, swelling, headaches, vision changes, SOB. She is taking her medication daily. She uses CPAP nightly. She is active in her yard. She has decreased her celexa to 10mg  and doing well.   .. Active Ambulatory Problems    Diagnosis Date Noted   UTI (urinary tract infection) 07/14/2012   Other and unspecified hyperlipidemia 07/14/2012   MVP (mitral valve prolapse) 07/14/2012   GERD (gastroesophageal reflux disease) 07/14/2012   Allergic rhinitis 07/14/2012   Mitral regurgitation 07/14/2012   S/P hysterectomy 07/14/2012   Essential hypertension, benign 04/29/2013   Wheezing 04/29/2013   Insomnia 04/29/2013   Unspecified vitamin D deficiency 04/29/2013   Migraine headache 04/29/2013   Anxiety state 04/29/2013   Unspecified hypothyroidism 04/29/2013   Obesity (BMI 30-39.9) 07/17/2013   Cough 08/15/2013   Arthritis of left knee 09/14/2013   Status post total knee replacement 09/14/2013   Abdominal pain, chronic, right upper quadrant 11/16/2013   Candidiasis of female genitalia 11/16/2013   Elevated liver enzymes 11/16/2013   Folliculitis 11/16/2013   Nausea alone 11/18/2013   Abnormal transaminases 11/18/2013   Hyperlipidemia 11/22/2014   Single kidney 11/22/2014   Hypothyroidism 11/22/2014   OSA on CPAP 12/23/2015   Daytime sleepiness 01/24/2017   Carpal tunnel syndrome, left upper limb 02/25/2017   Status post carpal tunnel release 03/11/2017   Hypertriglyceridemia 07/14/2017   Elevated fasting glucose 07/14/2017   Chronic pain of right knee 09/13/2017   Unilateral primary osteoarthritis, right knee  09/27/2017   Pre-diabetes 10/19/2017   Pain in thumb joint with movement of right hand 10/19/2017   Other meniscus derangements, posterior horn of medial meniscus, right knee 11/24/2017   Pain of right thumb 11/24/2017   Trigger thumb, right thumb 11/24/2017   BPPV (benign paroxysmal positional vertigo), right 02/20/2018   Concussion wth loss of consciousness of 30 minutes or less 02/20/2018   Fall 02/20/2018   CKD (chronic kidney disease) stage 3, GFR 30-59 ml/min (HCC) 03/10/2019   BPPV (benign paroxysmal positional vertigo), left 05/22/2019   Pain radiating to back 05/23/2019   Elevated lipase 06/12/2019   Seborrheic keratoses 01/05/2020   Papules 01/05/2020   Class 2 obesity due to excess calories without serious comorbidity with body mass index (BMI) of 37.0 to 37.9 in adult 01/05/2020   Anxiety and depression 06/03/2014   Fracture of triquetral bone of wrist 02/21/2018   Primary osteoarthritis involving multiple joints 06/03/2014   Degenerative disc disease, cervical 04/11/2020   Right arm pain 04/16/2020   Right arm weakness 04/16/2020   Iron deficiency 04/17/2020   Airway compromise 05/28/2020   Laryngospasm 05/28/2020   Status post cervical spinal fusion 06/04/2020   SOB (shortness of breath) 06/04/2020   History of laryngeal spasm 06/04/2020   Atelectasis 06/04/2020   Frequent headaches 10/14/2020   Epidermal cyst 03/07/2021   Abnormal kidney function 03/10/2021   Iron deficiency anemia 03/10/2021   Asthmatic bronchitis 11/13/2021   Palpitations 12/09/2021   Polycythemia 12/09/2021   Sinobronchitis 02/20/2022   Bronchiectasis with acute exacerbation (HCC) 03/20/2022   Right-sided headache 08/26/2022  Resolved Ambulatory Problems    Diagnosis Date Noted   Hypothyroidism 07/14/2012   Class 2 obesity due to excess calories without serious comorbidity with body mass index (BMI) of 35.0 to 35.9 in adult 10/19/2017   Past Medical History:  Diagnosis Date   Acute  medial meniscal tear    Allergy    Anxiety    Aortic insufficiency    Aortic regurgitation    Arthritis    Asthma    Cancer [facility]) May 2023   Carpal tunnel syndrome    Cataract 2022   Chronic kidney disease    Dysrhythmia    Heart murmur 1985   Hypertension 2015   Mitral valve prolapse    Need for SBE (subacute bacterial endocarditis) prophylaxis    Pilonidal cyst without mention of abscess    Pneumonia 08/14/2012   PONV (postoperative nausea and vomiting)    Sleep apnea    Thyroid disease    Transplanted kidney removed      Review of Systems  All other systems reviewed and are negative.     Objective:     BP (!) 136/58   Pulse (!) 54   Ht 5' 4.5" (1.638 m)   Wt 227 lb (103 kg)   SpO2 97%   BMI 38.36 kg/m  BP Readings from Last 3 Encounters:  12/04/22 (!) 136/58  09/25/22 (!) 125/50  09/07/22 136/64   Wt Readings from Last 3 Encounters:  12/04/22 227 lb (103 kg)  09/25/22 224 lb (101.6 kg)  09/07/22 226 lb (102.5 kg)    ..    11/24/2022    9:40 AM 08/26/2022   11:18 AM 05/06/2022    2:09 PM 11/13/2021    1:11 PM 03/07/2021   10:17 AM  Depression screen PHQ 2/9  Decreased Interest 0 1 0 0 0  Down, Depressed, Hopeless 0 0 0 0 0  PHQ - 2 Score 0 1 0 0 0      Physical Exam Constitutional:      Appearance: Normal appearance. She is obese.  Cardiovascular:     Rate and Rhythm: Normal rate and regular rhythm.     Pulses: Normal pulses.     Heart sounds: Murmur heard.  Pulmonary:     Effort: Pulmonary effort is normal.     Breath sounds: Normal breath sounds.  Musculoskeletal:     Right lower leg: No edema.     Left lower leg: No edema.  Neurological:     General: No focal deficit present.     Mental Status: She is alert and oriented to person, place, and time.  Psychiatric:        Mood and Affect: Mood normal.         Assessment & Plan:  Marland KitchenMarland KitchenNaiah was seen today for follow-up.  Diagnoses and all orders for this visit:  PVC (premature  ventricular contraction) -     metoprolol succinate (TOPROL-XL) 25 MG 24 hr tablet; Take 1 tablet (25 mg total) by mouth daily.  Essential hypertension, benign  MVP (mitral valve prolapse)  OSA on CPAP   Vitals look good Continue on same medications Refilled metoprolol Continue on CPAP machine No labs needed Continue to exercise and eat healthy Vaccines UTD Follow up at CPE.    Return if symptoms worsen or fail to improve.    Tandy Gaw, PA-C

## 2022-12-04 NOTE — Progress Notes (Signed)
Pt here to f/u on PVC's feels that she is doing pretty well.

## 2022-12-07 ENCOUNTER — Inpatient Hospital Stay: Payer: Medicare Other

## 2022-12-07 ENCOUNTER — Ambulatory Visit: Payer: Medicare Other | Admitting: Hematology & Oncology

## 2022-12-10 ENCOUNTER — Inpatient Hospital Stay (HOSPITAL_BASED_OUTPATIENT_CLINIC_OR_DEPARTMENT_OTHER): Payer: Medicare Other | Admitting: Medical Oncology

## 2022-12-10 ENCOUNTER — Inpatient Hospital Stay: Payer: Medicare Other

## 2022-12-10 ENCOUNTER — Other Ambulatory Visit: Payer: Self-pay | Admitting: Physician Assistant

## 2022-12-10 ENCOUNTER — Encounter: Payer: Self-pay | Admitting: Medical Oncology

## 2022-12-10 ENCOUNTER — Inpatient Hospital Stay: Payer: Medicare Other | Attending: Hematology & Oncology

## 2022-12-10 VITALS — BP 143/83 | HR 62 | Temp 98.3°F | Resp 17 | Wt 228.0 lb

## 2022-12-10 DIAGNOSIS — R5383 Other fatigue: Secondary | ICD-10-CM | POA: Insufficient documentation

## 2022-12-10 DIAGNOSIS — D696 Thrombocytopenia, unspecified: Secondary | ICD-10-CM | POA: Insufficient documentation

## 2022-12-10 DIAGNOSIS — R519 Headache, unspecified: Secondary | ICD-10-CM | POA: Diagnosis not present

## 2022-12-10 DIAGNOSIS — D751 Secondary polycythemia: Secondary | ICD-10-CM

## 2022-12-10 DIAGNOSIS — E781 Pure hyperglyceridemia: Secondary | ICD-10-CM

## 2022-12-10 DIAGNOSIS — R202 Paresthesia of skin: Secondary | ICD-10-CM | POA: Diagnosis not present

## 2022-12-10 DIAGNOSIS — D75839 Thrombocytosis, unspecified: Secondary | ICD-10-CM

## 2022-12-10 DIAGNOSIS — E611 Iron deficiency: Secondary | ICD-10-CM | POA: Diagnosis not present

## 2022-12-10 DIAGNOSIS — E782 Mixed hyperlipidemia: Secondary | ICD-10-CM

## 2022-12-10 DIAGNOSIS — R200 Anesthesia of skin: Secondary | ICD-10-CM | POA: Insufficient documentation

## 2022-12-10 DIAGNOSIS — D45 Polycythemia vera: Secondary | ICD-10-CM | POA: Insufficient documentation

## 2022-12-10 DIAGNOSIS — D500 Iron deficiency anemia secondary to blood loss (chronic): Secondary | ICD-10-CM

## 2022-12-10 LAB — CMP (CANCER CENTER ONLY)
ALT: 26 U/L (ref 0–44)
AST: 23 U/L (ref 15–41)
Albumin: 4.2 g/dL (ref 3.5–5.0)
Alkaline Phosphatase: 83 U/L (ref 38–126)
Anion gap: 5 (ref 5–15)
BUN: 16 mg/dL (ref 8–23)
CO2: 28 mmol/L (ref 22–32)
Calcium: 9.8 mg/dL (ref 8.9–10.3)
Chloride: 105 mmol/L (ref 98–111)
Creatinine: 1.28 mg/dL — ABNORMAL HIGH (ref 0.44–1.00)
GFR, Estimated: 46 mL/min — ABNORMAL LOW (ref >60)
Glucose, Bld: 168 mg/dL — ABNORMAL HIGH (ref 70–99)
Potassium: 4.2 mmol/L (ref 3.5–5.1)
Sodium: 138 mmol/L (ref 135–145)
Total Bilirubin: 0.7 mg/dL (ref 0.3–1.2)
Total Protein: 7 g/dL (ref 6.5–8.1)

## 2022-12-10 LAB — CBC WITH DIFFERENTIAL (CANCER CENTER ONLY)
Abs Immature Granulocytes: 0.07 10*3/uL (ref 0.00–0.07)
Basophils Absolute: 0.1 10*3/uL (ref 0.0–0.1)
Basophils Relative: 1 %
Eosinophils Absolute: 0.6 10*3/uL — ABNORMAL HIGH (ref 0.0–0.5)
Eosinophils Relative: 6 %
HCT: 44.2 % (ref 36.0–46.0)
Hemoglobin: 12.4 g/dL (ref 12.0–15.0)
Immature Granulocytes: 1 %
Lymphocytes Relative: 16 %
Lymphs Abs: 1.7 10*3/uL (ref 0.7–4.0)
MCH: 18.1 pg — ABNORMAL LOW (ref 26.0–34.0)
MCHC: 28.1 g/dL — ABNORMAL LOW (ref 30.0–36.0)
MCV: 64.4 fL — ABNORMAL LOW (ref 80.0–100.0)
Monocytes Absolute: 0.4 10*3/uL (ref 0.1–1.0)
Monocytes Relative: 4 %
Neutro Abs: 8 10*3/uL — ABNORMAL HIGH (ref 1.7–7.7)
Neutrophils Relative %: 72 %
Platelet Count: 582 10*3/uL — ABNORMAL HIGH (ref 150–400)
RBC: 6.86 MIL/uL — ABNORMAL HIGH (ref 3.87–5.11)
RDW: 21.5 % — ABNORMAL HIGH (ref 11.5–15.5)
WBC Count: 10.9 10*3/uL — ABNORMAL HIGH (ref 4.0–10.5)
nRBC: 0 % (ref 0.0–0.2)

## 2022-12-10 LAB — IRON AND IRON BINDING CAPACITY (CC-WL,HP ONLY)
Iron: 23 ug/dL — ABNORMAL LOW (ref 28–170)
Saturation Ratios: 4 % — ABNORMAL LOW (ref 10.4–31.8)
TIBC: 529 ug/dL — ABNORMAL HIGH (ref 250–450)
UIBC: 506 ug/dL — ABNORMAL HIGH (ref 148–442)

## 2022-12-10 LAB — FERRITIN: Ferritin: 6 ng/mL — ABNORMAL LOW (ref 11–307)

## 2022-12-10 NOTE — Progress Notes (Signed)
Hematology and Oncology Follow Up Visit  [name] [id number] 02-16-1956 67 y.o. 12/10/2022   Principle Diagnosis:  Polycythemia vera, JAK 2 positive    Current Therapy:        Phlebotomy as indicated to maintain Hct < 45%            Interim History:  [name] is here today for follow-up for her polycythemia vera.   She reports that she is doing well. Mild fatigue and headaches. No skin lesions.  She has not noted any blood loss. No bruising or petechiae.  No fever, chills, n/v, cough, rash, dizziness, SOB, chest pain, palpitations, abdominal pain or changes in bowel or bladder habits.  No swelling or tenderness in her extremities.   She has intermittent numbness and tingling in her feet.  No falls or syncope.  Appetite and hydration are good.   Wt Readings from Last 3 Encounters:  12/10/22 228 lb (103.4 kg)  12/04/22 227 lb (103 kg)  09/25/22 224 lb (101.6 kg)    ECOG Performance Status: 1 - Symptomatic but completely ambulatory  Medications:  Allergies as of 12/10/2022       Reactions   Percocet [oxycodone-acetaminophen] Other (See Comments)   SYNCOPE TACHYCARDIA   Shrimp [shellfish Allergy] Anaphylaxis, Itching, Nausea And Vomiting, Swelling   Hydrocodone-acetaminophen Nausea And Vomiting, Other (See Comments)   Dizziness, also   Lipitor [atorvastatin] Other (See Comments)   Ear pain/fullness in ear "as if I were under water"   Saxenda [liraglutide -weight Management] Hives, Itching, Other (See Comments)   Hives/itching/site reaction   Adhesive [tape] Rash, Other (See Comments)   EKG leads and certain bandages break out the skin   Belviq Xr [lorcaserin Hcl Er] Other (See Comments)   Gained weight    Ferrous Sulfate Nausea Only   Morphine And Related Nausea And Vomiting        Medication List        Accurate as of Dec 10, 2022  1:10 PM. If you have any questions, ask your nurse or doctor.          acetaminophen 325 MG tablet Commonly known as:  TYLENOL Take 325-650 mg by mouth every 6 (six) hours as needed for mild pain or headache.   albuterol 108 (90 Base) MCG/ACT inhaler Commonly known as: VENTOLIN HFA Inhale 2 puffs into the lungs every 6 (six) hours as needed for wheezing or shortness of breath.   Aspirin 81 MG Caps Take 81 mg by mouth daily at 6 (six) AM.   B-12 PO Take 1 tablet by mouth daily.   calcium carbonate 500 MG chewable tablet Commonly known as: TUMS - dosed in mg elemental calcium Chew 1 tablet by mouth as needed for indigestion or heartburn.   citalopram 20 MG tablet Commonly known as: CELEXA Take one tablet daily. What changed:  how much to take how to take this when to take this   levothyroxine 125 MCG tablet Commonly known as: SYNTHROID TAKE 1 TABLET BY MOUTH DAILY BEFORE BREAKFAST.   lisinopril 5 MG tablet Commonly known as: ZESTRIL Take 1 tablet (5 mg total) by mouth daily.   LUBRICATING EYE DROPS OP Place 1 drop into both eyes as needed (dry eyes).   metoprolol succinate 25 MG 24 hr tablet Commonly known as: TOPROL-XL Take 1 tablet (25 mg total) by mouth daily.   pantoprazole 40 MG tablet Commonly known as: PROTONIX TAKE 1 TABLET BY MOUTH EVERY DAY   promethazine 25 MG tablet  Commonly known as: PHENERGAN Take 1 tablet (25 mg total) by mouth as needed.   rosuvastatin 10 MG tablet Commonly known as: CRESTOR TAKE 1 TABLET BY MOUTH EVERY DAY   Vitamin D3 75 MCG (3000 UT) Tabs Take 1,000 Units by mouth daily.        Allergies:  Allergies  Allergen Reactions   Percocet [Oxycodone-Acetaminophen] Other (See Comments)    SYNCOPE TACHYCARDIA   Shrimp [Shellfish Allergy] Anaphylaxis, Itching, Nausea And Vomiting and Swelling   Hydrocodone-Acetaminophen Nausea And Vomiting and Other (See Comments)    Dizziness, also   Lipitor [Atorvastatin] Other (See Comments)    Ear pain/fullness in ear "as if I were under water"   Saxenda [Liraglutide -Weight Management] Hives, Itching and  Other (See Comments)    Hives/itching/site reaction   Adhesive [Tape] Rash and Other (See Comments)    EKG leads and certain bandages break out the skin   Belviq Xr [Lorcaserin Hcl Er] Other (See Comments)    Gained weight    Ferrous Sulfate Nausea Only   Morphine And Related Nausea And Vomiting    Past Medical History, Surgical history, Social history, and Family History were reviewed and updated.  Review of Systems: All other 10 point review of systems is negative.   Physical Exam:  weight is 228 lb (103.4 kg). Her oral temperature is 98.3 F (36.8 C). Her blood pressure is 143/83 (abnormal) and her pulse is 62. Her respiration is 17 and oxygen saturation is 98%.   Wt Readings from Last 3 Encounters:  12/10/22 228 lb (103.4 kg)  12/04/22 227 lb (103 kg)  09/25/22 224 lb (101.6 kg)    Ocular: Sclerae unicteric, pupils equal, round and reactive to light Ear-nose-throat: Oropharynx clear, dentition fair Lymphatic: No cervical or supraclavicular adenopathy Lungs no rales or rhonchi, good excursion bilaterally Heart regular rate and rhythm, no murmur appreciated MSK no focal spinal tenderness, no joint edema Neuro: non-focal, well-oriented, appropriate affect  Lab Results  Component Value Date   WBC 10.9 (H) 12/10/2022   HGB 12.4 12/10/2022   HCT 44.2 12/10/2022   MCV 64.4 (L) 12/10/2022   PLT 582 (H) 12/10/2022   Lab Results  Component Value Date   FERRITIN 8 (L) 10/19/2022   IRON 26 (L) 10/19/2022   TIBC 529 (H) 10/19/2022   UIBC 503 (H) 10/19/2022   IRONPCTSAT 5 (L) 10/19/2022   Lab Results  Component Value Date   RETICCTPCT 2.6 12/17/2021   RBC 6.86 (H) 12/10/2022   No results found for: "KPAFRELGTCHN", "LAMBDASER", "KAPLAMBRATIO" No results found for: "IGGSERUM", "IGA", "IGMSERUM" No results found for: "TOTALPROTELP", "ALBUMINELP", "A1GS", "A2GS", "BETS", "BETA2SER", "GAMS", "MSPIKE", "SPEI"   Chemistry      Component Value Date/Time   NA 138 12/10/2022  1236   K 4.2 12/10/2022 1236   CL 105 12/10/2022 1236   CO2 28 12/10/2022 1236   BUN 16 12/10/2022 1236   CREATININE 1.28 (H) 12/10/2022 1236   CREATININE 1.18 (H) 12/11/2021 1221      Component Value Date/Time   CALCIUM 9.8 12/10/2022 1236   ALKPHOS 83 12/10/2022 1236   AST 23 12/10/2022 1236   ALT 26 12/10/2022 1236   BILITOT 0.7 12/10/2022 1236     Impression and Plan: Ms. Hust is a very pleasant 67 yo caucasian female with polycythemia, JAK 2 positive. No phlebotomy needed at this time. Hct 44.2%.  Thrombocytopenia is a bit improved and likely secondary to her iron deficiency  Iron studies pending.   Disposition:  No Phlebotomy today Lab only appointment in 6 weeks.  RTC APP, labs, + phlebotomy.   Rushie Chestnut, PA-C 5/2/20241:10 PM

## 2022-12-16 ENCOUNTER — Ambulatory Visit (INDEPENDENT_AMBULATORY_CARE_PROVIDER_SITE_OTHER): Payer: Medicare Other

## 2022-12-16 DIAGNOSIS — Z780 Asymptomatic menopausal state: Secondary | ICD-10-CM

## 2022-12-16 DIAGNOSIS — Z0000 Encounter for general adult medical examination without abnormal findings: Secondary | ICD-10-CM

## 2022-12-16 NOTE — Progress Notes (Signed)
Normal bone density! GREAt news. Stay on vitamin D and calcium recheck in 5 years.

## 2022-12-28 ENCOUNTER — Ambulatory Visit: Payer: Medicare Other | Attending: Cardiology | Admitting: Cardiology

## 2022-12-28 ENCOUNTER — Encounter: Payer: Self-pay | Admitting: Cardiology

## 2022-12-28 VITALS — BP 130/60 | HR 67 | Ht 64.0 in | Wt 225.0 lb

## 2022-12-28 DIAGNOSIS — R002 Palpitations: Secondary | ICD-10-CM | POA: Insufficient documentation

## 2022-12-28 DIAGNOSIS — E782 Mixed hyperlipidemia: Secondary | ICD-10-CM | POA: Diagnosis present

## 2022-12-28 NOTE — Patient Instructions (Signed)
Medication Instructions:  The current medical regimen is effective;  continue present plan and medications.  *If you need a refill on your cardiac medications before your next appointment, please call your pharmacy*  Follow-Up: At [facility], you and your health needs are our priority.  As part of our continuing mission to provide you with exceptional heart care, we have created designated Provider Care Teams.  These Care Teams include your primary Cardiologist (physician) and Advanced Practice Providers (APPs -  Physician Assistants and Nurse Practitioners) who all work together to provide you with the care you need, when you need it.  We recommend signing up for the patient portal called "MyChart".  Sign up information is provided on this After Visit Summary.  MyChart is used to connect with patients for Virtual Visits (Telemedicine).  Patients are able to view lab/test results, encounter notes, upcoming appointments, etc.  Non-urgent messages can be sent to your provider as well.   To learn more about what you can do with MyChart, go to [email or web address].    Your next appointment:   1 year(s)  Provider:   Mark Skains, MD      

## 2022-12-28 NOTE — Progress Notes (Signed)
Cardiology Office Note:    Date:  12/28/2022   ID:  [name], DOB 1955-11-08, MRN [id number]  PCP:  Nolene Ebbs  [facility] Cardiologist:  Donato Schultz, MD  [facility] Electrophysiologist:  None   Referring MD: Jomarie Longs, PA-C    History of Present Illness:    [name] is a 67 y.o. female here for follow-up of palpitations.  She messaged our office 11/13/2021 and reported having frequent PVC's, recently increasing in frequency. She had recently seen her PCP and had lab work. She will follow-up with her PCP regarding her Hemoglobin of 19.1.  Previously here for follow-up of mitral regurgitation.  She has had a murmur for several years.  Had PVCs on monitor previously which correlated with palpitations.  2023 [facility] - Toprol 25 for PVC's.   Ventricular bigeminy, went to [place].  Placed on Toprol.  Nuclear stress test was normal.  CTA normal.  Has been on Crestor for hyperlipidemia.  Has had vertigo at times; suffered a fall with fracture at 1 point.  She has cut out caffeine in her diet significantly.  Has 1 cup in the morning.  She is not a smoker.  She remains compliant with Crestor, and reports her triglycerides were high. Our records show labs on 11/13/21 revealed triglycerides 230.  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  Past Medical History:  Diagnosis Date   Acute medial meniscal tear    Right knee   Allergy    Anxiety    Aortic insufficiency    Mild   Aortic regurgitation    Arthritis    Asthma    Cancer [facility]) May 2023   Polycythemia Vera   Carpal tunnel syndrome    Cataract 2022   Chronic kidney disease    one kidney    Dysrhythmia    pvc's at times   GERD (gastroesophageal reflux disease)    Heart murmur 1985   MVP   Hyperlipidemia    Hypertension 2015   Hypothyroidism    Migraine headache    Mitral regurgitation    Mitral valve prolapse    Nausea alone    Need for SBE  (subacute bacterial endocarditis) prophylaxis    Pilonidal cyst without mention of abscess    Pneumonia 08/14/2012   PONV (postoperative nausea and vomiting)    states she vasovagals   Sleep apnea    wears CPAP   Thyroid disease    Transplanted kidney removed    donated kidney    Past Surgical History:  Procedure Laterality Date   ABDOMINAL HYSTERECTOMY     ANTERIOR CERVICAL DECOMP/DISCECTOMY FUSION  05/28/2020   CARPAL TUNNEL RELEASE Right    CARPAL TUNNEL RELEASE Left 02/25/2017   Procedure: LEFT OPEN CARPAL TUNNEL RELEASE;  Surgeon: Kathryne Hitch, MD;  Location: [facility] OR;  Service: Orthopedics;  Laterality: Left;   CARPAL TUNNEL RELEASE Left 1998   CARPAL TUNNEL RELEASE Right 2019   COLONOSCOPY     JOINT REPLACEMENT  2015   RTKA   KNEE ARTHROSCOPY Right 01/06/2018   Procedure: RIGHT KNEE ARTHROSCOPY WITH PARTIAL  MEDIAL MENISCECTOMY;  Surgeon: Kathryne Hitch, MD;  Location: [facility] OR;  Service: Orthopedics;  Laterality: Right;   KNEE SURGERY     LARYNGOSCOPY N/A 05/30/2020   Procedure: LARYNGOSCOPY;  Surgeon: Newman Pies, MD;  Location: [facility] OR;  Service: ENT;  Laterality: N/A;   NEPHRECTOMY     PARTIAL KNEE  ARTHROPLASTY Right 03/31/2018   Procedure: RIGHT KNEE UNI ARTHROPLASTY;  Surgeon: Teryl Lucy, MD;  Location: [facility];  Service: Orthopedics;  Laterality: Right;   SPINE SURGERY  Oct 2021   ACDF   TONSILLECTOMY     TOTAL KNEE ARTHROPLASTY Left 09/14/2013   Procedure: LEFT TOTAL KNEE ARTHROPLASTY;  Surgeon: Kathryne Hitch, MD;  Location: [facility] OR;  Service: Orthopedics;  Laterality: Left;   TUBAL LIGATION     WISDOM TOOTH EXTRACTION     WOUND EXPLORATION N/A 05/28/2020   Procedure: ANTERIOR CERVICAL WOUND EXPLORATION;  Surgeon: Maeola Harman, MD;  Location: [facility] OR;  Service: Neurosurgery;  Laterality: N/A;    Current Medications: Current Meds  Medication Sig   acetaminophen (TYLENOL) 325 MG tablet Take 325-650 mg by mouth every 6  (six) hours as needed for mild pain or headache.   albuterol (VENTOLIN HFA) 108 (90 Base) MCG/ACT inhaler Inhale 2 puffs into the lungs every 6 (six) hours as needed for wheezing or shortness of breath.   Aspirin 81 MG CAPS Take 81 mg by mouth daily at 6 (six) AM.   calcium carbonate (TUMS - DOSED IN MG ELEMENTAL CALCIUM) 500 MG chewable tablet Chew 1 tablet by mouth as needed for indigestion or heartburn.   Carboxymethylcellul-Glycerin (LUBRICATING EYE DROPS OP) Place 1 drop into both eyes as needed (dry eyes).    cetirizine (ZYRTEC) 5 MG tablet Take 10 mg by mouth daily.   Cholecalciferol (VITAMIN D3) 3000 units TABS Take 1,000 Units by mouth daily.    citalopram (CELEXA) 20 MG tablet Take one tablet daily. (Patient taking differently: Take 10 mg by mouth daily. Take one tablet daily.)   Cyanocobalamin (B-12 PO) Take 1 tablet by mouth daily.    levothyroxine (SYNTHROID) 125 MCG tablet TAKE 1 TABLET BY MOUTH DAILY BEFORE BREAKFAST.   lisinopril (ZESTRIL) 5 MG tablet Take 1 tablet (5 mg total) by mouth daily.   metoprolol succinate (TOPROL-XL) 25 MG 24 hr tablet Take 1 tablet (25 mg total) by mouth daily.   pantoprazole (PROTONIX) 40 MG tablet TAKE 1 TABLET BY MOUTH EVERY DAY   promethazine (PHENERGAN) 25 MG tablet Take 1 tablet (25 mg total) by mouth as needed.   rosuvastatin (CRESTOR) 10 MG tablet TAKE 1 TABLET BY MOUTH EVERY DAY     Allergies:   Percocet [oxycodone-acetaminophen], Shrimp [shellfish allergy], Hydrocodone-acetaminophen, Lipitor [atorvastatin], Saxenda [liraglutide -weight management], Adhesive [tape], Belviq xr [lorcaserin hcl er], Ferrous sulfate, and Morphine and codeine   Social History   Socioeconomic History   Marital status: Married    Spouse name: Tom   Number of children: 7   Years of education: 18   Highest education level: Manufacturing engineer (e.g., MA, MS, MEng, MEd, MSW, MBA)  Occupational History    Employer: [employer]   Occupation: Retired  Tobacco Use    Smoking status: Former    Packs/day: 0.25    Years: 5.00    Additional pack years: 0.00    Total pack years: 1.25    Types: Cigarettes    Quit date: 11/22/1983    Years since quitting: 39.1   Smokeless tobacco: Never   Tobacco comments:    Did not smoke daily  Vaping Use   Vaping Use: Never used  Substance and Sexual Activity   Alcohol use: Yes    Comment: Rare maybe once a month   Drug use: No   Sexual activity: Yes    Partners: Male    Birth control/protection: Post-menopausal  Other Topics Concern   Not on file  Social History Narrative   Lives with spouse. She has 7 children. She enjoys quilting and reading.   Social Determinants of Health   Financial Resource Strain: Low Risk  (12/03/2022)   Overall Financial Resource Strain (CARDIA)    Difficulty of Paying Living Expenses: Not hard at all  Food Insecurity: No Food Insecurity (12/03/2022)   Hunger Vital Sign    Worried About Running Out of Food in the Last Year: Never true    Ran Out of Food in the Last Year: Never true  Transportation Needs: No Transportation Needs (12/03/2022)   PRAPARE - Administrator, Civil Service (Medical): No    Lack of Transportation (Non-Medical): No  Physical Activity: Insufficiently Active (12/03/2022)   Exercise Vital Sign    Days of Exercise per Week: 2 days    Minutes of Exercise per Session: 30 min  Stress: No Stress Concern Present (12/03/2022)   [employer] of Occupational Health - Occupational Stress Questionnaire    Feeling of Stress : Not at all  Social Connections: Socially Integrated (12/03/2022)   Social Connection and Isolation Panel [NHANES]    Frequency of Communication with Friends and Family: More than three times a week    Frequency of Social Gatherings with Friends and Family: Three times a week    Attends Religious Services: 1 to 4 times per year    Active Member of Clubs or Organizations: Yes    Attends Banker Meetings: Never    Marital  Status: Married     Family History: The patient's family history includes AAA (abdominal aortic aneurysm) in her father; Arthritis in her father and sister; Asthma in her sister; Cancer in her maternal aunt, maternal aunt, and sister; Diabetes in her mother and sister; Fibromyalgia in her sister; Hearing loss in her daughter; Heart disease in her father and son; Hypertension in her father, mother, and sister; Kidney disease in her daughter; Miscarriages / Stillbirths in her sister; Obesity in her daughter and sister; Obstructive Sleep Apnea in her sister; Stroke in her paternal grandmother; Thyroid disease in her father. There is no history of Colon cancer, Esophageal cancer, Rectal cancer, or Stomach cancer.  ROS:   Please see the history of present illness.    All other systems reviewed and are negative.  EKGs/Labs/Other Studies Reviewed:    The following studies were reviewed today: Cardiac Studies & Procedures       ECHOCARDIOGRAM  ECHOCARDIOGRAM COMPLETE 04/16/2020  Narrative ECHOCARDIOGRAM REPORT    Patient Name:   SHYNA GILLOGLY Date of Exam: 04/16/2020 Medical Rec #:  [id number]       Height:       64.5 in Accession #:    [id number]      Weight:       230.0 lb Date of Birth:  14-Nov-1955       BSA:          2.087 m Patient Age:    64 years        BP:           120/60 mmHg Patient Gender: F               HR:           63 bpm. Exam Location:  [address]  Procedure: 2D Echo, Cardiac Doppler, Color Doppler and Intracardiac Opacification Agent  Indications:    I35.1 Aortic Valve Insufficiency  History:  Patient has prior history of Echocardiogram examinations, most recent 01/18/2015. Risk Factors:Dyslipidemia. Sleep apnea-CPAP. PVC's. Hypothyroidism.  Sonographer:    Sedonia Small Rodgers-Jones RDCS Referring Phys: [id number] Zachary Lovins C Lazar Tierce  IMPRESSIONS   1. Left ventricular ejection fraction, by estimation, is 60 to 65%. The left ventricle has normal function. The left  ventricle has no regional wall motion abnormalities. Left ventricular diastolic parameters are consistent with Grade I diastolic dysfunction (impaired relaxation). 2. Right ventricular systolic function is normal. The right ventricular size is normal. Tricuspid regurgitation signal is inadequate for assessing PA pressure. 3. The mitral valve is normal in structure. Trivial mitral valve regurgitation. No evidence of mitral stenosis. 4. The aortic valve is tricuspid. Aortic valve regurgitation is trivial. Mild aortic valve sclerosis is present, with no evidence of aortic valve stenosis. 5. The inferior vena cava is normal in size with greater than 50% respiratory variability, suggesting right atrial pressure of 3 mmHg.  FINDINGS Left Ventricle: Left ventricular ejection fraction, by estimation, is 60 to 65%. The left ventricle has normal function. The left ventricle has no regional wall motion abnormalities. Definity contrast agent was given IV to delineate the left ventricular endocardial borders. The left ventricular internal cavity size was normal in size. There is no left ventricular hypertrophy. Left ventricular diastolic parameters are consistent with Grade I diastolic dysfunction (impaired relaxation).  Right Ventricle: The right ventricular size is normal. No increase in right ventricular wall thickness. Right ventricular systolic function is normal. Tricuspid regurgitation signal is inadequate for assessing PA pressure.  Left Atrium: Left atrial size was normal in size.  Right Atrium: Right atrial size was normal in size.  Pericardium: Trivial pericardial effusion is present.  Mitral Valve: The mitral valve is normal in structure. Trivial mitral valve regurgitation. No evidence of mitral valve stenosis.  Tricuspid Valve: The tricuspid valve is normal in structure. Tricuspid valve regurgitation is not demonstrated.  Aortic Valve: The aortic valve is tricuspid. Aortic valve regurgitation  is trivial. Aortic regurgitation PHT measures 511 msec. Mild aortic valve sclerosis is present, with no evidence of aortic valve stenosis. Aortic valve mean gradient measures 7.0 mmHg. Aortic valve peak gradient measures 12.6 mmHg. Aortic valve area, by VTI measures 2.79 cm.  Pulmonic Valve: The pulmonic valve was normal in structure. Pulmonic valve regurgitation is not visualized.  Aorta: The aortic root is normal in size and structure.  Venous: The inferior vena cava is normal in size with greater than 50% respiratory variability, suggesting right atrial pressure of 3 mmHg.  IAS/Shunts: No atrial level shunt detected by color flow Doppler.   LEFT VENTRICLE PLAX 2D LVIDd:         4.70 cm  Diastology LVIDs:         3.10 cm  LV e' lateral:   7.72 cm/s LV PW:         0.80 cm  LV E/e' lateral: 12.0 LV IVS:        0.80 cm  LV e' medial:    8.81 cm/s LVOT diam:     2.10 cm  LV E/e' medial:  10.6 LV SV:         113 LV SV Index:   54 LVOT Area:     3.46 cm   RIGHT VENTRICLE RV Basal diam:  3.90 cm RV S prime:     13.20 cm/s TAPSE (M-mode): 2.7 cm  LEFT ATRIUM             Index       RIGHT  ATRIUM           Index LA diam:        3.00 cm 1.44 cm/m  RA Area:     12.00 cm LA Vol (A2C):   64.6 ml 30.95 ml/m RA Volume:   28.10 ml  13.46 ml/m LA Vol (A4C):   43.8 ml 20.99 ml/m LA Biplane Vol: 56.1 ml 26.88 ml/m AORTIC VALVE AV Area (Vmax):    2.44 cm AV Area (Vmean):   2.53 cm AV Area (VTI):     2.79 cm AV Vmax:           177.25 cm/s AV Vmean:          123.750 cm/s AV VTI:            0.404 m AV Peak Grad:      12.6 mmHg AV Mean Grad:      7.0 mmHg LVOT Vmax:         125.00 cm/s LVOT Vmean:        90.300 cm/s LVOT VTI:          0.326 m LVOT/AV VTI ratio: 0.81 AI PHT:            511 msec  AORTA Ao Root diam: 3.30 cm Ao Asc diam:  3.60 cm  MITRAL VALVE MV Area (PHT): 3.60 cm     SHUNTS MV Decel Time: 211 msec     Systemic VTI:  0.33 m MV E velocity: 93.00 cm/s    Systemic Diam: 2.10 cm MV A velocity: 104.00 cm/s MV E/A ratio:  0.89  Marca Ancona MD Electronically signed by Marca Ancona MD Signature Date/Time: 04/16/2020/4:22:22 PM    Final    MONITORS  LONG TERM MONITOR (3-14 DAYS) 01/13/2022  Narrative  Sinus rhythm average heart rate 65 bpm.  Rare atrial tachycardia average 150 bpm longest lasting 13 beats.  Rare PACs.  Frequent PVCs, 7.9%.  No atrial fibrillation.  Lets trial Toprol-XL 25 mg once a day for PVC suppression.   Patch Wear Time:  9 days and 1 hours (2023-05-04T15:03:40-0400 to 2023-05-13T16:04:01-399)  Patient had a min HR of 44 bpm, max HR of 160 bpm, and avg HR of 65 bpm. Predominant underlying rhythm was Sinus Rhythm. 2 Supraventricular Tachycardia runs occurred, the run with the fastest interval lasting 13 beats with a max rate of 160 bpm (avg 150 bpm); the run with the fastest interval was also the longest. Isolated SVEs were rare (<1.0%), SVE Couplets were rare (<1.0%), and no SVE Triplets were present. Isolated VEs were frequent (7.9%, 66460), VE Couplets were rare (<1.0%, 152), and no VE Triplets were present. Ventricular Bigeminy and Trigeminy were present.  Donato Schultz, MD            EKG:  EKG is personally reviewed. 12/28/2022-normal sinus rhythm 67 no other changes. 12/09/2021: Sinus rhythm. Rate 65 bpm. 04/04/2020: Sinus rhythm 79 with no other significant abnormalities.  Recent Labs: 12/10/2022: ALT 26; BUN 16; Creatinine 1.28; Hemoglobin 12.4; Platelet Count 582; Potassium 4.2; Sodium 138   Recent Lipid Panel    Component Value Date/Time   CHOL 168 11/13/2021 1321   TRIG 230 (H) 11/13/2021 1321   HDL 45 (L) 11/13/2021 1321   CHOLHDL 3.7 11/13/2021 1321   VLDL 22 12/20/2014 0922   LDLCALC 91 11/13/2021 1321    Physical Exam:    VS:  BP 130/60   Pulse 67   Ht 5\' 4"  (1.626 m)   Wt 225 lb (102.1  kg)   SpO2 95%   BMI 38.62 kg/m     Wt Readings from Last 3 Encounters:  12/28/22 225 lb  (102.1 kg)  12/10/22 228 lb (103.4 kg)  12/04/22 227 lb (103 kg)     GEN:  Well nourished, well developed in no acute distress HEENT: Normal NECK: No JVD; No carotid bruits LYMPHATICS: No lymphadenopathy CARDIAC: RRR, no murmurs, rubs, gallops RESPIRATORY:  Clear to auscultation without rales, wheezing or rhonchi  ABDOMEN: Soft, non-tender, non-distended MUSCULOSKELETAL:  No edema; No deformity  SKIN: Warm and dry NEUROLOGIC:  Alert and oriented x 3 PSYCHIATRIC:  Normal affect   ASSESSMENT:    1. Palpitations      PLAN:     Palpitations PVCs, palpitations.  Toprol started at [facility].  Excellent.  25 mg seems to be holding things steady.  Many years ago she had a monitor that showed PVCs that correlated with palpitations.  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   Hyperlipidemia Currently on Crestor 10 mg a day.  Prior LDL 99.  ALT 43 at outside labs.  Polycythemia Recent hemoglobin increased.  Monitored by her primary physician.  Hemoglobin was 19.1 on 11/13/2021.  This is going to be repeated.  New finding for her.  Non-smoker.  No family history of hematologic disorders.   Follow-up:  1 year.  Medication Adjustments/Labs and Tests Ordered: Current medicines are reviewed at length with the patient today.  Concerns regarding medicines are outlined above.   Orders Placed This Encounter  Procedures   EKG 12-Lead   No orders of the defined types were placed in this encounter.  Patient Instructions  Medication Instructions:  The current medical regimen is effective;  continue present plan and medications.  *If you need a refill on your cardiac medications before your next appointment, please call your pharmacy*  Follow-Up: At [facility], you and your health needs are our priority.  As part of our continuing mission to provide you with exceptional heart care, we have created designated Provider Care Teams.  These Care Teams include  your primary Cardiologist (physician) and Advanced Practice Providers (APPs -  Physician Assistants and Nurse Practitioners) who all work together to provide you with the care you need, when you need it.  We recommend signing up for the patient portal called "MyChart".  Sign up information is provided on this After Visit Summary.  MyChart is used to connect with patients for Virtual Visits (Telemedicine).  Patients are able to view lab/test results, encounter notes, upcoming appointments, etc.  Non-urgent messages can be sent to your provider as well.   To learn more about what you can do with MyChart, go to [email or web address].    Your next appointment:   1 year(s)  Provider:   Donato Schultz, MD        [facility],acting as a scribe for Donato Schultz, MD.,have documented all relevant documentation on the behalf of Donato Schultz, MD,as directed by  Donato Schultz, MD while in the presence of Donato Schultz, MD.  I, Donato Schultz, MD, have reviewed all documentation for this visit. The documentation on 12/28/22 for the exam, diagnosis, procedures, and orders are all accurate and complete.   Signed, Donato Schultz, MD  12/28/2022 3:16 PM    [facility]

## 2022-12-30 ENCOUNTER — Ambulatory Visit (INDEPENDENT_AMBULATORY_CARE_PROVIDER_SITE_OTHER): Payer: Medicare Other | Admitting: Physician Assistant

## 2022-12-30 ENCOUNTER — Encounter: Payer: Self-pay | Admitting: Physician Assistant

## 2022-12-30 DIAGNOSIS — R631 Polydipsia: Secondary | ICD-10-CM | POA: Diagnosis not present

## 2022-12-30 DIAGNOSIS — J209 Acute bronchitis, unspecified: Secondary | ICD-10-CM | POA: Diagnosis not present

## 2022-12-30 DIAGNOSIS — R7303 Prediabetes: Secondary | ICD-10-CM | POA: Diagnosis not present

## 2022-12-30 LAB — POCT GLYCOSYLATED HEMOGLOBIN (HGB A1C): Hemoglobin A1C: 5.6 % (ref 4.0–5.6)

## 2022-12-30 MED ORDER — HYDROCOD POLI-CHLORPHE POLI ER 10-8 MG/5ML PO SUER
5.0000 mL | Freq: Two times a day (BID) | ORAL | 0 refills | Status: DC | PRN
Start: 2022-12-30 — End: 2023-06-21

## 2022-12-30 MED ORDER — AZITHROMYCIN 250 MG PO TABS
ORAL_TABLET | ORAL | 0 refills | Status: DC
Start: 2022-12-30 — End: 2023-03-08

## 2022-12-30 MED ORDER — PREDNISONE 20 MG PO TABS
ORAL_TABLET | ORAL | 0 refills | Status: DC
Start: 2022-12-30 — End: 2023-03-08

## 2022-12-30 NOTE — Patient Instructions (Addendum)
Use nebulizer twice a day Start prednisone Start zpak if not improving in next 2 days Tussionex for cough at bedtime  Acute Bronchitis, Adult  Acute bronchitis is sudden inflammation of the main airways (bronchi) that come off the windpipe (trachea) in the lungs. The swelling causes the airways to get smaller and make more mucus than normal. This can make it hard to breathe and can cause coughing or noisy breathing (wheezing). Acute bronchitis may last several weeks. The cough may last longer. Allergies, asthma, and exposure to smoke may make the condition worse. What are the causes? This condition can be caused by germs and by substances that irritate the lungs, including: Cold and flu viruses. The most common cause of this condition is the virus that causes the common cold. Bacteria. This is less common. Breathing in substances that irritate the lungs, including: Smoke from cigarettes and other forms of tobacco. Dust and pollen. Fumes from household cleaning products, gases, or burned fuel. Indoor or outdoor air pollution. What increases the risk? The following factors may make you more likely to develop this condition: A weak body's defense system, also called the immune system. A condition that affects your lungs and breathing, such as asthma. What are the signs or symptoms? Common symptoms of this condition include: Coughing. This may bring up clear, yellow, or green mucus from your lungs (sputum). Wheezing. Runny or stuffy nose. Having too much mucus in your lungs (chest congestion). Shortness of breath. Aches and pains, including sore throat or chest. How is this diagnosed? This condition is usually diagnosed based on: Your symptoms and medical history. A physical exam. You may also have other tests, including tests to rule out other conditions, such as pneumonia. These tests include: A test of lung function. Test of a mucus sample to look for the presence of  bacteria. Tests to check the oxygen level in your blood. Blood tests. Chest X-ray. How is this treated? Most cases of acute bronchitis clear up over time without treatment. Your health care provider may recommend: Drinking more fluids to help thin your mucus so it is easier to cough up. Taking inhaled medicine (inhaler) to improve air flow in and out of your lungs. Using a vaporizer or a humidifier. These are machines that add water to the air to help you breathe better. Taking a medicine that thins mucus and clears congestion (expectorant). Taking a medicine that prevents or stops coughing (cough suppressant). It is not common to take an antibiotic medicine for this condition. Follow these instructions at home:  Take over-the-counter and prescription medicines only as told by your health care provider. Use an inhaler, vaporizer, or humidifier as told by your health care provider. Take two teaspoons (10 mL) of honey at bedtime to lessen coughing at night. Drink enough fluid to keep your urine pale yellow. Do not use any products that contain nicotine or tobacco. These products include cigarettes, chewing tobacco, and vaping devices, such as e-cigarettes. If you need help quitting, ask your health care provider. Get plenty of rest. Return to your normal activities as told by your health care provider. Ask your health care provider what activities are safe for you. Keep all follow-up visits. This is important. How is this prevented? To lower your risk of getting this condition again: Wash your hands often with soap and water for at least 20 seconds. If soap and water are not available, use hand sanitizer. Avoid contact with people who have cold symptoms. Try not to touch  your mouth, nose, or eyes with your hands. Avoid breathing in smoke or chemical fumes. Breathing smoke or chemical fumes will make your condition worse. Get the flu shot every year. Contact a health care provider if: Your  symptoms do not improve after 2 weeks. You have trouble coughing up the mucus. Your cough keeps you awake at night. You have a fever. Get help right away if you: Cough up blood. Feel pain in your chest. Have severe shortness of breath. Faint or keep feeling like you are going to faint. Have a severe headache. Have a fever or chills that get worse. These symptoms may represent a serious problem that is an emergency. Do not wait to see if the symptoms will go away. Get medical help right away. Call your local emergency services (911 in the [place] Do not drive yourself to the hospital. Summary Acute bronchitis is inflammation of the main airways (bronchi) that come off the windpipe (trachea) in the lungs. The swelling causes the airways to get smaller and make more mucus than normal. Drinking more fluids can help thin your mucus so it is easier to cough up. Take over-the-counter and prescription medicines only as told by your health care provider. Do not use any products that contain nicotine or tobacco. These products include cigarettes, chewing tobacco, and vaping devices, such as e-cigarettes. If you need help quitting, ask your health care provider. Contact a health care provider if your symptoms do not improve after 2 weeks. This information is not intended to replace advice given to you by your health care provider. Make sure you discuss any questions you have with your health care provider. Document Revised: 11/06/2021 Document Reviewed: 11/27/2020 Elsevier Patient Education  2023 [employer].

## 2022-12-30 NOTE — Progress Notes (Signed)
Acute Office Visit  Subjective:     Patient ID: [name], female    DOB: 09-Mar-1956, 67 y.o.   MRN: [id number]  Chief Complaint  Patient presents with   Cough    HPI Patient is in today for cough and shortness of breath and chest tightness for 2 weeks. She is using her albuterol bid and usually does not use it at all. No fever, chills, body aches. She does have runny nose with clear discharge. Her cough is mildly productive but feels more "tight". Not taking OTC medications.   Pt is concerned about increase in thirst and urination. She has hx of pre-diabetes. Would like checked today.   .. Active Ambulatory Problems    Diagnosis Date Noted   UTI (urinary tract infection) 07/14/2012   Other and unspecified hyperlipidemia 07/14/2012   MVP (mitral valve prolapse) 07/14/2012   GERD (gastroesophageal reflux disease) 07/14/2012   Allergic rhinitis 07/14/2012   Mitral regurgitation 07/14/2012   S/P hysterectomy 07/14/2012   Essential hypertension, benign 04/29/2013   Wheezing 04/29/2013   Insomnia 04/29/2013   Unspecified vitamin D deficiency 04/29/2013   Migraine headache 04/29/2013   Anxiety state 04/29/2013   Unspecified hypothyroidism 04/29/2013   Obesity (BMI 30-39.9) 07/17/2013   Cough 08/15/2013   Arthritis of left knee 09/14/2013   Status post total knee replacement 09/14/2013   Abdominal pain, chronic, right upper quadrant 11/16/2013   Candidiasis of female genitalia 11/16/2013   Elevated liver enzymes 11/16/2013   Folliculitis 11/16/2013   Nausea alone 11/18/2013   Abnormal transaminases 11/18/2013   Hyperlipidemia 11/22/2014   Single kidney 11/22/2014   Hypothyroidism 11/22/2014   OSA on CPAP 12/23/2015   Daytime sleepiness 01/24/2017   Carpal tunnel syndrome, left upper limb 02/25/2017   Status post carpal tunnel release 03/11/2017   Hypertriglyceridemia 07/14/2017   Elevated fasting glucose 07/14/2017   Chronic pain of right knee 09/13/2017    Unilateral primary osteoarthritis, right knee 09/27/2017   Pre-diabetes 10/19/2017   Pain in thumb joint with movement of right hand 10/19/2017   Other meniscus derangements, posterior horn of medial meniscus, right knee 11/24/2017   Pain of right thumb 11/24/2017   Trigger thumb, right thumb 11/24/2017   BPPV (benign paroxysmal positional vertigo), right 02/20/2018   Concussion wth loss of consciousness of 30 minutes or less 02/20/2018   Fall 02/20/2018   CKD (chronic kidney disease) stage 3, GFR 30-59 ml/min (HCC) 03/10/2019   BPPV (benign paroxysmal positional vertigo), left 05/22/2019   Pain radiating to back 05/23/2019   Elevated lipase 06/12/2019   Seborrheic keratoses 01/05/2020   Papules 01/05/2020   Class 2 obesity due to excess calories without serious comorbidity with body mass index (BMI) of 37.0 to 37.9 in adult 01/05/2020   Anxiety and depression 06/03/2014   Fracture of triquetral bone of wrist 02/21/2018   Primary osteoarthritis involving multiple joints 06/03/2014   Degenerative disc disease, cervical 04/11/2020   Right arm pain 04/16/2020   Right arm weakness 04/16/2020   Iron deficiency 04/17/2020   Airway compromise 05/28/2020   Laryngospasm 05/28/2020   Status post cervical spinal fusion 06/04/2020   SOB (shortness of breath) 06/04/2020   History of laryngeal spasm 06/04/2020   Atelectasis 06/04/2020   Frequent headaches 10/14/2020   Epidermal cyst 03/07/2021   Abnormal kidney function 03/10/2021   Iron deficiency anemia 03/10/2021   Asthmatic bronchitis 11/13/2021   Palpitations 12/09/2021   Polycythemia 12/09/2021   Sinobronchitis 02/20/2022   Bronchiectasis with acute exacerbation (  HCC) 03/20/2022   Right-sided headache 08/26/2022   Resolved Ambulatory Problems    Diagnosis Date Noted   Hypothyroidism 07/14/2012   Class 2 obesity due to excess calories without serious comorbidity with body mass index (BMI) of 35.0 to 35.9 in adult 10/19/2017   Past  Medical History:  Diagnosis Date   Acute medial meniscal tear    Allergy    Anxiety    Aortic insufficiency    Aortic regurgitation    Arthritis    Asthma    Cancer [facility]) May 2023   Carpal tunnel syndrome    Cataract 2022   Chronic kidney disease    Dysrhythmia    Heart murmur 1985   Hypertension 2015   Mitral valve prolapse    Need for SBE (subacute bacterial endocarditis) prophylaxis    Pilonidal cyst without mention of abscess    Pneumonia 08/14/2012   PONV (postoperative nausea and vomiting)    Sleep apnea    Thyroid disease    Transplanted kidney removed      ROS  See HPI.     Objective:    BP (!) 119/59   Pulse 75   Ht 5\' 4"  (1.626 m)   Wt 225 lb (102.1 kg)   SpO2 94%   BMI 38.62 kg/m  BP Readings from Last 3 Encounters:  12/30/22 (!) 119/59  12/28/22 130/60  12/10/22 (!) 143/83   Wt Readings from Last 3 Encounters:  12/30/22 225 lb (102.1 kg)  12/28/22 225 lb (102.1 kg)  12/10/22 228 lb (103.4 kg)    .Marland Kitchen Results for orders placed or performed in visit on 12/30/22  POCT glycosylated hemoglobin (Hb A1C)  Result Value Ref Range   Hemoglobin A1C 5.6 4.0 - 5.6 %   HbA1c POC (<> result, manual entry)     HbA1c, POC (prediabetic range)     HbA1c, POC (controlled diabetic range)       Physical Exam Constitutional:      Appearance: Normal appearance. She is obese.  HENT:     Head: Normocephalic.     Comments: No maxillary or frontal tenderness to palpation.     Right Ear: Tympanic membrane, ear canal and external ear normal. There is no impacted cerumen.     Left Ear: Tympanic membrane, ear canal and external ear normal. There is no impacted cerumen.     Nose: Congestion present.     Mouth/Throat:     Mouth: Mucous membranes are moist.     Pharynx: Posterior oropharyngeal erythema present. No oropharyngeal exudate.  Eyes:     Conjunctiva/sclera: Conjunctivae normal.  Cardiovascular:     Rate and Rhythm: Normal rate and regular rhythm.      Pulses: Normal pulses.  Pulmonary:     Effort: Pulmonary effort is normal.     Breath sounds: Wheezing present.     Comments: Bilateral lower lungs wheezing and rhonchi Musculoskeletal:     Cervical back: Normal range of motion and neck supple. No tenderness.     Right lower leg: No edema.     Left lower leg: No edema.  Lymphadenopathy:     Cervical: Cervical adenopathy present.  Neurological:     General: No focal deficit present.     Mental Status: She is alert and oriented to person, place, and time.  Psychiatric:        Mood and Affect: Mood normal.          Assessment & Plan:  Marland KitchenMarland KitchenBlaiklee was seen today for  cough.  Diagnoses and all orders for this visit:  Increased thirst -     POCT glycosylated hemoglobin (Hb A1C)  Pre-diabetes -     POCT glycosylated hemoglobin (Hb A1C)  Acute bronchitis, unspecified organism -     predniSONE (DELTASONE) 20 MG tablet; Take 3 tablets for 3 days, take 2 tablets for 3 days, take 1 tablet for 3 days, take 1/2 tablet for 4 days. -     azithromycin (ZITHROMAX Z-PAK) 250 MG tablet; Take 2 tablets (500 mg) on  Day 1,  followed by 1 tablet (250 mg) once daily on Days 2 through 5. -     chlorpheniramine-HYDROcodone (TUSSIONEX) 10-8 MG/5ML; Take 5 mLs by mouth every 12 (twelve) hours as needed for cough (cough, will cause drowsiness.).   Suspect more asthmatic bronchitis Start prednisone and nebulizer every 4-6 hours If not improving or worsening start zpak Tussionex for cough at bedtime, sedation warning given Rest and hydration  A1C looks great No concerns for diabetes   Tandy Gaw, PA-C

## 2023-03-08 ENCOUNTER — Other Ambulatory Visit: Payer: Self-pay

## 2023-03-08 ENCOUNTER — Other Ambulatory Visit: Payer: Self-pay | Admitting: *Deleted

## 2023-03-08 ENCOUNTER — Encounter: Payer: Self-pay | Admitting: Hematology & Oncology

## 2023-03-08 ENCOUNTER — Inpatient Hospital Stay: Payer: Medicare Other

## 2023-03-08 ENCOUNTER — Inpatient Hospital Stay: Payer: Medicare Other | Attending: Hematology & Oncology

## 2023-03-08 ENCOUNTER — Inpatient Hospital Stay: Payer: Medicare Other | Admitting: Hematology & Oncology

## 2023-03-08 VITALS — BP 136/55 | HR 58 | Temp 98.5°F | Resp 18 | Ht 64.5 in | Wt 227.0 lb

## 2023-03-08 DIAGNOSIS — D45 Polycythemia vera: Secondary | ICD-10-CM | POA: Insufficient documentation

## 2023-03-08 DIAGNOSIS — D751 Secondary polycythemia: Secondary | ICD-10-CM

## 2023-03-08 DIAGNOSIS — Z7982 Long term (current) use of aspirin: Secondary | ICD-10-CM | POA: Insufficient documentation

## 2023-03-08 DIAGNOSIS — D500 Iron deficiency anemia secondary to blood loss (chronic): Secondary | ICD-10-CM

## 2023-03-08 HISTORY — DX: Polycythemia vera: D45

## 2023-03-08 LAB — COMPREHENSIVE METABOLIC PANEL
ALT: 23 U/L (ref 0–44)
AST: 22 U/L (ref 15–41)
Albumin: 4.5 g/dL (ref 3.5–5.0)
Alkaline Phosphatase: 89 U/L (ref 38–126)
Anion gap: 9 (ref 5–15)
BUN: 13 mg/dL (ref 8–23)
CO2: 27 mmol/L (ref 22–32)
Calcium: 10.3 mg/dL (ref 8.9–10.3)
Chloride: 105 mmol/L (ref 98–111)
Creatinine, Ser: 1.24 mg/dL — ABNORMAL HIGH (ref 0.44–1.00)
GFR, Estimated: 47 mL/min — ABNORMAL LOW (ref >60)
Glucose, Bld: 141 mg/dL — ABNORMAL HIGH (ref 70–99)
Potassium: 4.1 mmol/L (ref 3.5–5.1)
Sodium: 141 mmol/L (ref 135–145)
Total Bilirubin: 0.9 mg/dL (ref 0.3–1.2)
Total Protein: 6.6 g/dL (ref 6.5–8.1)

## 2023-03-08 LAB — CBC WITH DIFFERENTIAL (CANCER CENTER ONLY)
Abs Immature Granulocytes: 0.11 10*3/uL — ABNORMAL HIGH (ref 0.00–0.07)
Basophils Absolute: 0.1 10*3/uL (ref 0.0–0.1)
Basophils Relative: 1 %
Eosinophils Absolute: 0.7 10*3/uL — ABNORMAL HIGH (ref 0.0–0.5)
Eosinophils Relative: 5 %
HCT: 44.4 % (ref 36.0–46.0)
Hemoglobin: 12.6 g/dL (ref 12.0–15.0)
Immature Granulocytes: 1 %
Lymphocytes Relative: 16 %
Lymphs Abs: 2 10*3/uL (ref 0.7–4.0)
MCH: 18.5 pg — ABNORMAL LOW (ref 26.0–34.0)
MCHC: 28.4 g/dL — ABNORMAL LOW (ref 30.0–36.0)
MCV: 65.1 fL — ABNORMAL LOW (ref 80.0–100.0)
Monocytes Absolute: 0.7 10*3/uL (ref 0.1–1.0)
Monocytes Relative: 6 %
Neutro Abs: 9.3 10*3/uL — ABNORMAL HIGH (ref 1.7–7.7)
Neutrophils Relative %: 71 %
Platelet Count: 579 10*3/uL — ABNORMAL HIGH (ref 150–400)
RBC: 6.82 MIL/uL — ABNORMAL HIGH (ref 3.87–5.11)
RDW: 21.5 % — ABNORMAL HIGH (ref 11.5–15.5)
WBC Count: 13 10*3/uL — ABNORMAL HIGH (ref 4.0–10.5)
nRBC: 0 % (ref 0.0–0.2)

## 2023-03-08 LAB — IRON AND IRON BINDING CAPACITY (CC-WL,HP ONLY)
Iron: 26 ug/dL — ABNORMAL LOW (ref 28–170)
Saturation Ratios: 5 % — ABNORMAL LOW (ref 10.4–31.8)
TIBC: 545 ug/dL — ABNORMAL HIGH (ref 250–450)
UIBC: 519 ug/dL — ABNORMAL HIGH (ref 148–442)

## 2023-03-08 LAB — FERRITIN: Ferritin: 9 ng/mL — ABNORMAL LOW (ref 11–307)

## 2023-03-08 NOTE — Progress Notes (Signed)
Hematology and Oncology Follow Up Visit  NEKA ODEGAARD [id number] Aug 27, 1955 67 y.o. 03/08/2023   Principle Diagnosis:  Polycythemia vera, JAK 2 positive    Current Therapy:        Phlebotomy as indicated to maintain Hct < 45%    Aspirin 81 mg p.o. daily         Interim History:  Ms. Freund is here today for follow-up for her polycythemia vera.  This my first visit with him.  They are both true [employer] hero's.  They both served in the [employer].  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  Ms. Kefauver as she was had an endoscopy over at [facility].  She is doing well.  She does take baby aspirin.  She has had no problems with bleeding or bruising.  There is no problems with fatigue or weakness.  She is quite iron deficient.  I formally saw her back in May, her ferritin was 6 with an iron saturation of 4%.  She has had no cough or shortness of breath.  She has had no nausea or vomiting.  She is due for colonoscopy in October.  Her last ultrasound of the spleen was back in May 2023.  She is due for another 1.  She said she had a mammogram this year already.  She has had no headache.  There is been no visual changes.  Overall, I would say that her performance status is probably ECOG 1.   Wt Readings from Last 3 Encounters:  03/08/23 227 lb (103 kg)  12/30/22 225 lb (102.1 kg)  12/28/22 225 lb (102.1 kg)      Medications:  Allergies as of 03/08/2023       Reactions   Percocet [oxycodone-acetaminophen] Other (See Comments)   SYNCOPE TACHYCARDIA   Shrimp [shellfish Allergy] Anaphylaxis, Itching, Nausea And Vomiting, Swelling   Hydrocodone-acetaminophen Nausea And Vomiting, Other (See Comments)   Dizziness, also   Lipitor [atorvastatin] Other (See Comments)   Ear pain/fullness in ear "as if I were under water"   Saxenda [liraglutide -weight Management] Hives, Itching, Other (See Comments)   Hives/itching/site reaction   Adhesive [tape] Rash, Other (See  Comments)   EKG leads and certain bandages break out the skin   Belviq Xr [lorcaserin Hcl Er] Other (See Comments)   Gained weight    Ferrous Sulfate Nausea Only   Morphine And Codeine Nausea And Vomiting        Medication List        Accurate as of March 08, 2023  9:10 AM. If you have any questions, ask your nurse or doctor.          STOP taking these medications    azithromycin 250 MG tablet Commonly known as: Zithromax Z-Pak Stopped by: Josph Macho   predniSONE 20 MG tablet Commonly known as: DELTASONE Stopped by: Josph Macho       TAKE these medications    acetaminophen 325 MG tablet Commonly known as: TYLENOL Take 325-650 mg by mouth every 6 (six) hours as needed for mild pain or headache.   albuterol 108 (90 Base) MCG/ACT inhaler Commonly known as: VENTOLIN HFA Inhale 2 puffs into the lungs every 6 (six) hours as needed for wheezing or shortness of breath.   Aspirin 81 MG Caps Take 81 mg by mouth daily at 6 (six) AM.   B-12 PO Take 1 tablet by mouth daily.   calcium carbonate 500 MG chewable tablet Commonly known as:  TUMS - dosed in mg elemental calcium Chew 1 tablet by mouth as needed for indigestion or heartburn.   cetirizine 5 MG tablet Commonly known as: ZYRTEC Take 10 mg by mouth daily.   chlorpheniramine-HYDROcodone 10-8 MG/5ML Commonly known as: TUSSIONEX Take 5 mLs by mouth every 12 (twelve) hours as needed for cough (cough, will cause drowsiness.).   citalopram 20 MG tablet Commonly known as: CELEXA Take one tablet daily. What changed:  how much to take how to take this when to take this   levothyroxine 125 MCG tablet Commonly known as: SYNTHROID TAKE 1 TABLET BY MOUTH DAILY BEFORE BREAKFAST.   lisinopril 5 MG tablet Commonly known as: ZESTRIL Take 1 tablet (5 mg total) by mouth daily.   LUBRICATING EYE DROPS OP Place 1 drop into both eyes as needed (dry eyes).   metoprolol succinate 25 MG 24 hr tablet Commonly  known as: TOPROL-XL Take 1 tablet (25 mg total) by mouth daily.   pantoprazole 40 MG tablet Commonly known as: PROTONIX TAKE 1 TABLET BY MOUTH EVERY DAY   promethazine 25 MG tablet Commonly known as: PHENERGAN Take 1 tablet (25 mg total) by mouth as needed.   rosuvastatin 10 MG tablet Commonly known as: CRESTOR TAKE 1 TABLET BY MOUTH EVERY DAY   Vitamin D3 75 MCG (3000 UT) Tabs Take 1,000 Units by mouth daily.        Allergies:  Allergies  Allergen Reactions   Percocet [Oxycodone-Acetaminophen] Other (See Comments)    SYNCOPE TACHYCARDIA   Shrimp [Shellfish Allergy] Anaphylaxis, Itching, Nausea And Vomiting and Swelling   Hydrocodone-Acetaminophen Nausea And Vomiting and Other (See Comments)    Dizziness, also   Lipitor [Atorvastatin] Other (See Comments)    Ear pain/fullness in ear "as if I were under water"   Saxenda [Liraglutide -Weight Management] Hives, Itching and Other (See Comments)    Hives/itching/site reaction   Adhesive [Tape] Rash and Other (See Comments)    EKG leads and certain bandages break out the skin   Belviq Xr [Lorcaserin Hcl Er] Other (See Comments)    Gained weight    Ferrous Sulfate Nausea Only   Morphine And Codeine Nausea And Vomiting    Past Medical History, Surgical history, Social history, and Family History were reviewed and updated.  Review of Systems: All other 10 point review of systems is negative.   Physical Exam:  height is 5' 4.5" (1.638 m) and weight is 227 lb (103 kg). Her oral temperature is 98.5 F (36.9 C). Her blood pressure is 136/55 (abnormal) and her pulse is 58 (abnormal). Her respiration is 18.   Wt Readings from Last 3 Encounters:  03/08/23 227 lb (103 kg)  12/30/22 225 lb (102.1 kg)  12/28/22 225 lb (102.1 kg)   Physical Exam Vitals reviewed.  HENT:     Head: Normocephalic and atraumatic.  Eyes:     Pupils: Pupils are equal, round, and reactive to light.  Cardiovascular:     Rate and Rhythm: Normal rate  and regular rhythm.     Heart sounds: Normal heart sounds.  Pulmonary:     Effort: Pulmonary effort is normal.     Breath sounds: Normal breath sounds.  Abdominal:     General: Bowel sounds are normal.     Palpations: Abdomen is soft.  Musculoskeletal:        General: No tenderness or deformity. Normal range of motion.     Cervical back: Normal range of motion.  Lymphadenopathy:  Cervical: No cervical adenopathy.  Skin:    General: Skin is warm and dry.     Findings: No erythema or rash.  Neurological:     Mental Status: She is alert and oriented to person, place, and time.  Psychiatric:        Behavior: Behavior normal.        Thought Content: Thought content normal.        Judgment: Judgment normal.     Lab Results  Component Value Date   WBC 13.0 (H) 03/08/2023   HGB 12.6 03/08/2023   HCT 44.4 03/08/2023   MCV 65.1 (L) 03/08/2023   PLT 579 (H) 03/08/2023   Lab Results  Component Value Date   FERRITIN 6 (L) 12/10/2022   IRON 23 (L) 12/10/2022   TIBC 529 (H) 12/10/2022   UIBC 506 (H) 12/10/2022   IRONPCTSAT 4 (L) 12/10/2022   Lab Results  Component Value Date   RETICCTPCT 2.6 12/17/2021   RBC 6.82 (H) 03/08/2023   No results found for: "KPAFRELGTCHN", "LAMBDASER", "KAPLAMBRATIO" No results found for: "IGGSERUM", "IGA", "IGMSERUM" No results found for: "TOTALPROTELP", "ALBUMINELP", "A1GS", "A2GS", "BETS", "BETA2SER", "GAMS", "MSPIKE", "SPEI"   Chemistry      Component Value Date/Time   NA 138 12/10/2022 1236   K 4.2 12/10/2022 1236   CL 105 12/10/2022 1236   CO2 28 12/10/2022 1236   BUN 16 12/10/2022 1236   CREATININE 1.28 (H) 12/10/2022 1236   CREATININE 1.18 (H) 12/11/2021 1221      Component Value Date/Time   CALCIUM 9.8 12/10/2022 1236   ALKPHOS 83 12/10/2022 1236   AST 23 12/10/2022 1236   ALT 26 12/10/2022 1236   BILITOT 0.7 12/10/2022 1236     Impression and Plan: Ms. Huckeba is a very pleasant 67 yo caucasian female with polycythemia.   She is JAK2 positive.  For right now, we do not have to phlebotomize her.  Her thrombocytosis is relatively stable.  I see nothing on her blood smear that looks suspicious.  We will try to get her through the Summer now.  I will plan to see her back in late September.  We will get an ultrasound of her spleen in a couple weeks for follow-up of her splenomegaly.     Josph Macho, MD 7/29/20249:10 AM

## 2023-03-10 ENCOUNTER — Other Ambulatory Visit: Payer: Self-pay | Admitting: Physician Assistant

## 2023-03-10 ENCOUNTER — Ambulatory Visit (INDEPENDENT_AMBULATORY_CARE_PROVIDER_SITE_OTHER): Payer: Medicare Other

## 2023-03-10 DIAGNOSIS — D45 Polycythemia vera: Secondary | ICD-10-CM

## 2023-03-10 DIAGNOSIS — I10 Essential (primary) hypertension: Secondary | ICD-10-CM

## 2023-03-12 ENCOUNTER — Other Ambulatory Visit: Payer: Self-pay | Admitting: Physician Assistant

## 2023-03-12 DIAGNOSIS — F411 Generalized anxiety disorder: Secondary | ICD-10-CM

## 2023-03-18 ENCOUNTER — Other Ambulatory Visit: Payer: Self-pay | Admitting: Physician Assistant

## 2023-03-18 DIAGNOSIS — E039 Hypothyroidism, unspecified: Secondary | ICD-10-CM

## 2023-04-17 ENCOUNTER — Other Ambulatory Visit: Payer: Self-pay | Admitting: Physician Assistant

## 2023-04-17 DIAGNOSIS — R110 Nausea: Secondary | ICD-10-CM

## 2023-05-05 ENCOUNTER — Telehealth: Payer: Self-pay | Admitting: General Practice

## 2023-05-05 NOTE — Transitions of Care (Post Inpatient/ED Visit) (Signed)
05/05/2023  Name: [name] MRN: [id number] DOB: 1955-09-08  Today's TOC FU Call Status: Today's TOC FU Call Status:: Successful TOC FU Call Completed TOC FU Call Complete Date: 05/05/23 Patient's Name and Date of Birth confirmed.  Transition Care Management Follow-up Telephone Call Date of Discharge: 05/04/23 Discharge Facility: Other [facility]) Name of Other (Non[facility]) Discharge Facility: [facility] Type of Discharge: Emergency Department Reason for ED Visit: Other: (fall) How have you been since you were released from the hospital?: Same Any questions or concerns?: No  Items Reviewed: Did you receive and understand the discharge instructions provided?: Yes Medications obtained,verified, and reconciled?: Yes (Medications Reviewed) Any new allergies since your discharge?: No Dietary orders reviewed?: NA Do you have support at home?: Yes  Medications Reviewed Today: Medications Reviewed Today     Reviewed by Modesto Charon, RN (Registered Nurse) on 05/05/23 at 1103  Med List Status: <None>   Medication Order Taking? Sig Documenting Provider Last Dose Status Informant  acetaminophen (TYLENOL) 325 MG tablet [id number] No Take 325-650 mg by mouth every 6 (six) hours as needed for mild pain or headache. [provider] Taking Active   albuterol (VENTOLIN HFA) 108 (90 Base) MCG/ACT inhaler [id number] No Inhale 2 puffs into the lungs every 6 (six) hours as needed for wheezing or shortness of breath. Josephine Igo, DO Taking Active   Aspirin 81 MG CAPS [id number] No Take 81 mg by mouth daily at 6 (six) AM. [provider] Taking Active   calcium carbonate (TUMS - DOSED IN MG ELEMENTAL CALCIUM) 500 MG chewable tablet [id number] No Chew 1 tablet by mouth as needed for indigestion or heartburn. [provider] Taking Active Spouse/Significant Other  Carboxymethylcellul-Glycerin (LUBRICATING EYE DROPS OP) [id number] No Place 1 drop into both eyes as needed  (dry eyes).  [provider] Taking Active Spouse/Significant Other  cetirizine (ZYRTEC) 5 MG tablet [id number] No Take 10 mg by mouth daily. [provider] Taking Active   chlorpheniramine-HYDROcodone (TUSSIONEX) 10-8 MG/5ML [id number]  Take 5 mLs by mouth every 12 (twelve) hours as needed for cough (cough, will cause drowsiness.). Jomarie Longs, PA-C  Active   Cholecalciferol (VITAMIN D3) 3000 units TABS [id number] No Take 1,000 Units by mouth daily.  [provider] Taking Active Spouse/Significant Other  citalopram (CELEXA) 20 MG tablet [id number]  TAKE 1/2 TABLET BY MOUTH ONCE A DAY Breeback, Jade L, PA-C  Active   Cyanocobalamin (B-12 PO) [id number] No Take 1 tablet by mouth daily.  [provider] Taking Active Spouse/Significant Other  levothyroxine (SYNTHROID) 125 MCG tablet [id number]  TAKE 1 TABLET BY MOUTH EVERY DAY BEFORE BREAKFAST Breeback, Jade L, PA-C  Active   lisinopril (ZESTRIL) 5 MG tablet [id number]  TAKE 1 TABLET (5 MG TOTAL) BY MOUTH DAILY. Breeback, Jade L, PA-C  Active   metoprolol succinate (TOPROL-XL) 25 MG 24 hr tablet [id number] No Take 1 tablet (25 mg total) by mouth daily. Jomarie Longs, PA-C Taking Active   pantoprazole (PROTONIX) 40 MG tablet [id number] No TAKE 1 TABLET BY MOUTH EVERY DAY Breeback, Jade L, PA-C Taking Active   promethazine (PHENERGAN) 25 MG tablet [id number]  TAKE 1 TABLET (25 MG TOTAL) BY MOUTH AS NEEDED. Jomarie Longs, PA-C  Active   rosuvastatin (CRESTOR) 10 MG tablet [id number] No TAKE 1 TABLET BY MOUTH EVERY DAY Breeback, Lonna Cobb, PA-C Taking Active             Home Care and Equipment/Supplies: Were Home Health Services Ordered?: NA  Any new equipment or medical supplies ordered?: NA  Functional Questionnaire: Do you need assistance with bathing/showering or dressing?: No Do you need assistance with meal preparation?: No Do you need assistance with eating?: No Do you have difficulty maintaining  continence: No Do you need assistance with getting out of bed/getting out of a chair/moving?: No Do you have difficulty managing or taking your medications?: No  Follow up appointments reviewed: PCP Follow-up appointment confirmed?: Yes Date of PCP follow-up appointment?: 05/11/23 Follow-up Provider: Tandy Gaw, PA Specialist Hospital Follow-up appointment confirmed?: NA Do you need transportation to your follow-up appointment?: No Do you understand care options if your condition(s) worsen?: Yes-patient verbalized understanding  SDOH Interventions Today    Flowsheet Row Most Recent Value  SDOH Interventions   Transportation Interventions Intervention Not Indicated       SIGNATURE Modesto Charon, RN BSN Nurse Health Advisor

## 2023-05-10 ENCOUNTER — Inpatient Hospital Stay (HOSPITAL_BASED_OUTPATIENT_CLINIC_OR_DEPARTMENT_OTHER): Payer: Medicare Other | Admitting: Hematology & Oncology

## 2023-05-10 ENCOUNTER — Inpatient Hospital Stay: Payer: Medicare Other

## 2023-05-10 ENCOUNTER — Other Ambulatory Visit: Payer: Self-pay

## 2023-05-10 ENCOUNTER — Encounter: Payer: Self-pay | Admitting: Hematology & Oncology

## 2023-05-10 ENCOUNTER — Inpatient Hospital Stay: Payer: Medicare Other | Attending: Hematology & Oncology

## 2023-05-10 VITALS — BP 130/58 | HR 59

## 2023-05-10 VITALS — BP 130/54 | HR 71 | Temp 98.5°F | Resp 18 | Ht 64.5 in | Wt 229.0 lb

## 2023-05-10 DIAGNOSIS — D45 Polycythemia vera: Secondary | ICD-10-CM

## 2023-05-10 DIAGNOSIS — D500 Iron deficiency anemia secondary to blood loss (chronic): Secondary | ICD-10-CM

## 2023-05-10 DIAGNOSIS — E611 Iron deficiency: Secondary | ICD-10-CM | POA: Diagnosis not present

## 2023-05-10 DIAGNOSIS — Z7982 Long term (current) use of aspirin: Secondary | ICD-10-CM | POA: Insufficient documentation

## 2023-05-10 DIAGNOSIS — D751 Secondary polycythemia: Secondary | ICD-10-CM

## 2023-05-10 LAB — CMP (CANCER CENTER ONLY)
ALT: 26 U/L (ref 0–44)
AST: 24 U/L (ref 15–41)
Albumin: 4.4 g/dL (ref 3.5–5.0)
Alkaline Phosphatase: 77 U/L (ref 38–126)
Anion gap: 7 (ref 5–15)
BUN: 13 mg/dL (ref 8–23)
CO2: 28 mmol/L (ref 22–32)
Calcium: 9.7 mg/dL (ref 8.9–10.3)
Chloride: 103 mmol/L (ref 98–111)
Creatinine: 1.2 mg/dL — ABNORMAL HIGH (ref 0.44–1.00)
GFR, Estimated: 49 mL/min — ABNORMAL LOW (ref >60)
Glucose, Bld: 127 mg/dL — ABNORMAL HIGH (ref 70–99)
Potassium: 4.3 mmol/L (ref 3.5–5.1)
Sodium: 138 mmol/L (ref 135–145)
Total Bilirubin: 0.7 mg/dL (ref 0.3–1.2)
Total Protein: 6.9 g/dL (ref 6.5–8.1)

## 2023-05-10 LAB — CBC WITH DIFFERENTIAL (CANCER CENTER ONLY)
Abs Immature Granulocytes: 0.07 10*3/uL (ref 0.00–0.07)
Basophils Absolute: 0.1 10*3/uL (ref 0.0–0.1)
Basophils Relative: 1 %
Eosinophils Absolute: 0.6 10*3/uL — ABNORMAL HIGH (ref 0.0–0.5)
Eosinophils Relative: 5 %
HCT: 45.1 % (ref 36.0–46.0)
Hemoglobin: 12.7 g/dL (ref 12.0–15.0)
Immature Granulocytes: 1 %
Lymphocytes Relative: 15 %
Lymphs Abs: 1.7 10*3/uL (ref 0.7–4.0)
MCH: 18.1 pg — ABNORMAL LOW (ref 26.0–34.0)
MCHC: 28.2 g/dL — ABNORMAL LOW (ref 30.0–36.0)
MCV: 64.3 fL — ABNORMAL LOW (ref 80.0–100.0)
Monocytes Absolute: 0.7 10*3/uL (ref 0.1–1.0)
Monocytes Relative: 6 %
Neutro Abs: 8.6 10*3/uL — ABNORMAL HIGH (ref 1.7–7.7)
Neutrophils Relative %: 72 %
Platelet Count: 573 10*3/uL — ABNORMAL HIGH (ref 150–400)
RBC: 7.01 MIL/uL — ABNORMAL HIGH (ref 3.87–5.11)
RDW: 20.7 % — ABNORMAL HIGH (ref 11.5–15.5)
WBC Count: 11.7 10*3/uL — ABNORMAL HIGH (ref 4.0–10.5)
nRBC: 0.2 % (ref 0.0–0.2)

## 2023-05-10 LAB — LACTATE DEHYDROGENASE: LDH: 290 U/L — ABNORMAL HIGH (ref 98–192)

## 2023-05-10 LAB — IRON AND IRON BINDING CAPACITY (CC-WL,HP ONLY)
Iron: 25 ug/dL — ABNORMAL LOW (ref 28–170)
Saturation Ratios: 5 % — ABNORMAL LOW (ref 10.4–31.8)
TIBC: 550 ug/dL — ABNORMAL HIGH (ref 250–450)
UIBC: 525 ug/dL — ABNORMAL HIGH (ref 148–442)

## 2023-05-10 LAB — SAVE SMEAR(SSMR), FOR PROVIDER SLIDE REVIEW

## 2023-05-10 LAB — FERRITIN: Ferritin: 7 ng/mL — ABNORMAL LOW (ref 11–307)

## 2023-05-10 MED ORDER — SODIUM CHLORIDE 0.9 % IV SOLN: INTRAVENOUS | Status: DC

## 2023-05-10 NOTE — Patient Instructions (Signed)

## 2023-05-10 NOTE — Progress Notes (Signed)
[name] presents today for phlebotomy per MD orders. Phlebotomy procedure started at 1055 via 18 gauge angio cath to left ac and ended at 1120.  500 grams removed without difficulty. of NS ran per MD order. Patient refused to stay for 30 minutes post phlebotomy and has no complaints at time of discharge.  Patient tolerated procedure well. IV needle removed intact.

## 2023-05-10 NOTE — Progress Notes (Signed)
Hematology and Oncology Follow Up Visit  [name] [id number] 08-22-1955 67 y.o. 05/10/2023   Principle Diagnosis:  Polycythemia vera, JAK 2 positive    Current Therapy:        Phlebotomy as indicated to maintain Hct < 45%    Aspirin 81 mg p.o. daily         Interim History:  [name] is here today for follow-up for her polycythemia vera.  So far, she has been doing pretty well.  She has had no problems over the summer..  We did go ahead do a splenic ultrasound.  This was done on 03/10/2023.  Her spleen did increase in size.  And now measures 933 cm.  We will have to watch this..  She is still on the baby aspirin.  She has had no bleeding.  She has no bruising..  She clearly is iron deficient.  Her MCV is quite low.  When I saw her, her ferritin was 9 with an iron saturation of 5%.  She has had no fever.  There is been no problems with COVID.  Think she is due for a colonoscopy next month.  Overall, I would say that her performance status is probably ECOG 1.   Wt Readings from Last 3 Encounters:  05/10/23 229 lb (103.9 kg)  03/08/23 227 lb (103 kg)  12/30/22 225 lb (102.1 kg)      Medications:  Allergies as of 05/10/2023       Reactions   Percocet [oxycodone-acetaminophen] Other (See Comments)   SYNCOPE TACHYCARDIA   Shrimp [shellfish Allergy] Anaphylaxis, Itching, Nausea And Vomiting, Swelling   Hydrocodone-acetaminophen Nausea And Vomiting, Other (See Comments)   Dizziness, also   Lipitor [atorvastatin] Other (See Comments)   Ear pain/fullness in ear "as if I were under water"   Saxenda [liraglutide -weight Management] Hives, Itching, Other (See Comments)   Hives/itching/site reaction   Adhesive [tape] Rash, Other (See Comments)   EKG leads and certain bandages break out the skin   Belviq Xr [lorcaserin Hcl Er] Other (See Comments)   Gained weight    Ferrous Sulfate Nausea Only   Morphine And Codeine Nausea And Vomiting        Medication List         Accurate as of May 10, 2023 10:23 AM. If you have any questions, ask your nurse or doctor.          acetaminophen 325 MG tablet Commonly known as: TYLENOL Take 325-650 mg by mouth every 6 (six) hours as needed for mild pain or headache.   albuterol 108 (90 Base) MCG/ACT inhaler Commonly known as: VENTOLIN HFA Inhale 2 puffs into the lungs every 6 (six) hours as needed for wheezing or shortness of breath.   Aspirin 81 MG Caps Take 81 mg by mouth daily at 6 (six) AM.   B-12 PO Take 1 tablet by mouth daily.   calcium carbonate 500 MG chewable tablet Commonly known as: TUMS - dosed in mg elemental calcium Chew 1 tablet by mouth as needed for indigestion or heartburn.   cetirizine 5 MG tablet Commonly known as: ZYRTEC Take 10 mg by mouth daily.   chlorpheniramine-HYDROcodone 10-8 MG/5ML Commonly known as: TUSSIONEX Take 5 mLs by mouth every 12 (twelve) hours as needed for cough (cough, will cause drowsiness.).   citalopram 20 MG tablet Commonly known as: CELEXA TAKE 1/2 TABLET BY MOUTH ONCE A DAY   cyclobenzaprine 10 MG tablet Commonly known as: FLEXERIL Take 10 mg by  mouth 2 (two) times daily as needed.   levothyroxine 125 MCG tablet Commonly known as: SYNTHROID TAKE 1 TABLET BY MOUTH EVERY DAY BEFORE BREAKFAST   lisinopril 5 MG tablet Commonly known as: ZESTRIL TAKE 1 TABLET (5 MG TOTAL) BY MOUTH DAILY.   LUBRICATING EYE DROPS OP Place 1 drop into both eyes as needed (dry eyes).   metoprolol succinate 25 MG 24 hr tablet Commonly known as: TOPROL-XL Take 1 tablet (25 mg total) by mouth daily.   pantoprazole 40 MG tablet Commonly known as: PROTONIX TAKE 1 TABLET BY MOUTH EVERY DAY   promethazine 25 MG tablet Commonly known as: PHENERGAN TAKE 1 TABLET (25 MG TOTAL) BY MOUTH AS NEEDED.   rosuvastatin 10 MG tablet Commonly known as: CRESTOR TAKE 1 TABLET BY MOUTH EVERY DAY   traMADol 50 MG tablet Commonly known as: ULTRAM Take 50 mg by mouth every  6 (six) hours as needed.   Vitamin D3 75 MCG (3000 UT) Tabs Take 1,000 Units by mouth daily.        Allergies:  Allergies  Allergen Reactions   Percocet [Oxycodone-Acetaminophen] Other (See Comments)    SYNCOPE TACHYCARDIA   Shrimp [Shellfish Allergy] Anaphylaxis, Itching, Nausea And Vomiting and Swelling   Hydrocodone-Acetaminophen Nausea And Vomiting and Other (See Comments)    Dizziness, also   Lipitor [Atorvastatin] Other (See Comments)    Ear pain/fullness in ear "as if I were under water"   Saxenda [Liraglutide -Weight Management] Hives, Itching and Other (See Comments)    Hives/itching/site reaction   Adhesive [Tape] Rash and Other (See Comments)    EKG leads and certain bandages break out the skin   Belviq Xr [Lorcaserin Hcl Er] Other (See Comments)    Gained weight    Ferrous Sulfate Nausea Only   Morphine And Codeine Nausea And Vomiting    Past Medical History, Surgical history, Social history, and Family History were reviewed and updated.  Review of Systems:  Review of Systems  Constitutional: Negative.   HENT: Negative.    Eyes: Negative.   Respiratory: Negative.    Cardiovascular: Negative.   Gastrointestinal: Negative.   Genitourinary: Negative.   Musculoskeletal: Negative.   Skin: Negative.   Neurological: Negative.   Endo/Heme/Allergies: Negative.   Psychiatric/Behavioral: Negative.       Physical Exam:  height is 5' 4.5" (1.638 m) and weight is 229 lb (103.9 kg). Her oral temperature is 98.5 F (36.9 C). Her blood pressure is 130/54 (abnormal) and her pulse is 71. Her respiration is 18 and oxygen saturation is 97%.   Wt Readings from Last 3 Encounters:  05/10/23 229 lb (103.9 kg)  03/08/23 227 lb (103 kg)  12/30/22 225 lb (102.1 kg)   Physical Exam Vitals reviewed.  HENT:     Head: Normocephalic and atraumatic.  Eyes:     Pupils: Pupils are equal, round, and reactive to light.  Cardiovascular:     Rate and Rhythm: Normal rate and  regular rhythm.     Heart sounds: Normal heart sounds.  Pulmonary:     Effort: Pulmonary effort is normal.     Breath sounds: Normal breath sounds.  Abdominal:     General: Bowel sounds are normal.     Palpations: Abdomen is soft.  Musculoskeletal:        General: No tenderness or deformity. Normal range of motion.     Cervical back: Normal range of motion.  Lymphadenopathy:     Cervical: No cervical adenopathy.  Skin:  General: Skin is warm and dry.     Findings: No erythema or rash.  Neurological:     Mental Status: She is alert and oriented to person, place, and time.  Psychiatric:        Behavior: Behavior normal.        Thought Content: Thought content normal.        Judgment: Judgment normal.     Lab Results  Component Value Date   WBC 11.7 (H) 05/10/2023   HGB 12.7 05/10/2023   HCT 45.1 05/10/2023   MCV 64.3 (L) 05/10/2023   PLT 573 (H) 05/10/2023   Lab Results  Component Value Date   FERRITIN 9 (L) 03/08/2023   IRON 26 (L) 03/08/2023   TIBC 545 (H) 03/08/2023   UIBC 519 (H) 03/08/2023   IRONPCTSAT 5 (L) 03/08/2023   Lab Results  Component Value Date   RETICCTPCT 2.6 12/17/2021   RBC 7.01 (H) 05/10/2023   No results found for: "KPAFRELGTCHN", "LAMBDASER", "KAPLAMBRATIO" No results found for: "IGGSERUM", "IGA", "IGMSERUM" No results found for: "TOTALPROTELP", "ALBUMINELP", "A1GS", "A2GS", "BETS", "BETA2SER", "GAMS", "MSPIKE", "SPEI"   Chemistry      Component Value Date/Time   NA 141 03/08/2023 0850   K 4.1 03/08/2023 0850   CL 105 03/08/2023 0850   CO2 27 03/08/2023 0850   BUN 13 03/08/2023 0850   CREATININE 1.24 (H) 03/08/2023 0850   CREATININE 1.28 (H) 12/10/2022 1236   CREATININE 1.18 (H) 12/11/2021 1221      Component Value Date/Time   CALCIUM 10.3 03/08/2023 0850   ALKPHOS 89 03/08/2023 0850   AST 22 03/08/2023 0850   AST 23 12/10/2022 1236   ALT 23 03/08/2023 0850   ALT 26 12/10/2022 1236   BILITOT 0.9 03/08/2023 0850   BILITOT 0.7  12/10/2022 1236     Impression and Plan: [name] is a very pleasant 67 yo caucasian female with polycythemia.  She is JAK2 positive.  We will go ahead and phlebotomize her today.  Her hematocrit is just right above 45.  Again, she does have the thrombocytosis.  Some of this could certainly be from her iron deficiency.  I will plan to get her back to see [place] in about 6 weeks.  I want to try to get her back before the Holiday season and then hopefully be able to get her through all of the Holidays.  I do not see that we had to put her on any medication right now.  If we did, I probably would just use Hydrea.  I probably would repeat a splenic ultrasound in about 3 months or so.    Josph Macho, MD 9/30/202410:23 AM

## 2023-05-11 ENCOUNTER — Ambulatory Visit (INDEPENDENT_AMBULATORY_CARE_PROVIDER_SITE_OTHER): Payer: Medicare Other | Admitting: Physician Assistant

## 2023-05-11 VITALS — BP 128/78

## 2023-05-11 DIAGNOSIS — Z23 Encounter for immunization: Secondary | ICD-10-CM

## 2023-05-11 DIAGNOSIS — G4733 Obstructive sleep apnea (adult) (pediatric): Secondary | ICD-10-CM | POA: Diagnosis not present

## 2023-05-11 DIAGNOSIS — S0990XD Unspecified injury of head, subsequent encounter: Secondary | ICD-10-CM

## 2023-05-11 DIAGNOSIS — R161 Splenomegaly, not elsewhere classified: Secondary | ICD-10-CM | POA: Insufficient documentation

## 2023-05-11 DIAGNOSIS — Z09 Encounter for follow-up examination after completed treatment for conditions other than malignant neoplasm: Secondary | ICD-10-CM

## 2023-05-11 DIAGNOSIS — M25551 Pain in right hip: Secondary | ICD-10-CM

## 2023-05-11 DIAGNOSIS — F5101 Primary insomnia: Secondary | ICD-10-CM

## 2023-05-11 MED ORDER — TRAZODONE HCL 50 MG PO TABS
25.0000 mg | ORAL_TABLET | Freq: Every evening | ORAL | 3 refills | Status: DC | PRN
Start: 2023-05-11 — End: 2023-06-04

## 2023-05-11 NOTE — Progress Notes (Unsigned)
Established Patient Office Visit  Subjective   Patient ID: [name], female    DOB: 13-Jun-1956  Age: 67 y.o. MRN: [id number]  Chief Complaint  Patient presents with   Medical Management of Chronic Issues    Insomnia     HPI  Patient is a 67 year old female in today for hospital follow-up. Insomnia, and splenomegaly.  Patient went to ED on 05/04/23 after falling at home. Patient was trying to perform a weightbearing trick at home with her granddaughter. She reports to having bilateral arms fully extended and supporting her weight on chairs while making an effort to sustain her weight and hold her legs out straight. Patient reports that the chairs slipped and she fell onto her right hip and hit her forehead on the way down. Patient denies any LOC. In ED, patient had right hip and pelvis XR that showed no fracture or dislocation. Noncontrast head CT showed no intracranial abnormality. Patient was stable at time of discharge and sent home with flexeril and tramadol for pain. Patient reports continued soreness of her right hip and right buttock today. Rates pain 4/10. Still taking tramadol and flexeril. Denies any numbness, tingling, paraesthesias, increased pain, or weakness of the right lower extremity.   Patient also complains of insomnia today. States she has struggled with it for a while now. Complains of trouble falling asleep and staying asleep occasionally. Patient does have OSA and uses CPAP nightly with good reported benefit. Reports no problems with her CPAP machine. Denies any late afternoon caffeine use. Patient has not tried any OTC meds for sleep and would like to discuss options. Patient does report adverse reaction to Ambien in the past, states it made her "drunk sleep walk".   Patient discussed increasing splenomegaly in office today. Had [place] on 03/10/2023. Spleen has increased in volume roughly 300 cc since last [place] in 12/2022. Patient is being followed closely by her  hematologist oncologist and has repeat US in January.   {History (Optional):23778}  ROS    Objective:     BP 128/78  BP Readings from Last 3 Encounters:  05/11/23 128/78  05/10/23 (!) 130/58  05/10/23 (!) 130/54   Wt Readings from Last 3 Encounters:  05/10/23 229 lb (103.9 kg)  03/08/23 227 lb (103 kg)  12/30/22 225 lb (102.1 kg)      Physical Exam    The 10-year ASCVD risk score (Arnett DK, et al., 2019) is: 10.1%    Assessment & Plan:  Marland KitchenMarland KitchenChalita was seen today for medical management of chronic issues.  Diagnoses and all orders for this visit:  Hospital discharge follow-up  Immunization due -     Flu vaccine trivalent PF, 6mos and older(Flulaval,Afluria,Fluarix,Fluzone)  OSA on CPAP  Primary insomnia  Spleen enlargement  Other orders -     traZODone (DESYREL) 50 MG tablet; Take 0.5-1 tablets (25-50 mg total) by mouth at bedtime as needed for sleep.     Continue taking flexeril and tramadol as needed for right hip pain Sent prescription for trazodone to use as needed for insomnia Discussed importance of good sleep hygiene and routine to aid in better sleep quality Patient is compliant with CPAP use Will have repeat spleen US in January with heme-onc Flu shot given today Mammogram and colonoscopy in the process of being scheduled  Tandy Gaw, PA-C

## 2023-05-11 NOTE — Patient Instructions (Signed)
Insomnia Insomnia is a sleep disorder that makes it difficult to fall asleep or stay asleep. Insomnia can cause fatigue, low energy, difficulty concentrating, mood swings, and poor performance at work or school. There are three different ways to classify insomnia: Difficulty falling asleep. Difficulty staying asleep. Waking up too early in the morning. Any type of insomnia can be long-term (chronic) or short-term (acute). Both are common. Short-term insomnia usually lasts for 3 months or less. Chronic insomnia occurs at least three times a week for longer than 3 months. What are the causes? Insomnia may be caused by another condition, situation, or substance, such as: Having certain mental health conditions, such as anxiety and depression. Using caffeine, alcohol, tobacco, or drugs. Having gastrointestinal conditions, such as gastroesophageal reflux disease (GERD). Having certain medical conditions. These include: Asthma. Alzheimer's disease. Stroke. Chronic pain. An overactive thyroid gland (hyperthyroidism). Other sleep disorders, such as restless legs syndrome and sleep apnea. Menopause. Sometimes, the cause of insomnia may not be known. What increases the risk? Risk factors for insomnia include: Gender. Females are affected more often than males. Age. Insomnia is more common as people get older. Stress and certain medical and mental health conditions. Lack of exercise. Having an irregular work schedule. This may include working night shifts and traveling between different time zones. What are the signs or symptoms? If you have insomnia, the main symptom is having trouble falling asleep or having trouble staying asleep. This may lead to other symptoms, such as: Feeling tired or having low energy. Feeling nervous about going to sleep. Not feeling rested in the morning. Having trouble concentrating. Feeling irritable, anxious, or depressed. How is this diagnosed? This condition  may be diagnosed based on: Your symptoms and medical history. Your health care provider may ask about: Your sleep habits. Any medical conditions you have. Your mental health. A physical exam. How is this treated? Treatment for insomnia depends on the cause. Treatment may focus on treating an underlying condition that is causing the insomnia. Treatment may also include: Medicines to help you sleep. Counseling or therapy. Lifestyle adjustments to help you sleep better. Follow these instructions at home: Eating and drinking  Limit or avoid alcohol, caffeinated beverages, and products that contain nicotine and tobacco, especially close to bedtime. These can disrupt your sleep. Do not eat a large meal or eat spicy foods right before bedtime. This can lead to digestive discomfort that can make it hard for you to sleep. Sleep habits  Keep a sleep diary to help you and your health care provider figure out what could be causing your insomnia. Write down: When you sleep. When you wake up during the night. How well you sleep and how rested you feel the next day. Any side effects of medicines you are taking. What you eat and drink. Make your bedroom a dark, comfortable place where it is easy to fall asleep. Put up shades or blackout curtains to block light from outside. Use a white noise machine to block noise. Keep the temperature cool. Limit screen use before bedtime. This includes: Not watching TV. Not using your smartphone, tablet, or computer. Stick to a routine that includes going to bed and waking up at the same times every day and night. This can help you fall asleep faster. Consider making a quiet activity, such as reading, part of your nighttime routine. Try to avoid taking naps during the day so that you sleep better at night. Get out of bed if you are still awake after   15 minutes of trying to sleep. Keep the lights down, but try reading or doing a quiet activity. When you feel  sleepy, go back to bed. General instructions Take over-the-counter and prescription medicines only as told by your health care provider. Exercise regularly as told by your health care provider. However, avoid exercising in the hours right before bedtime. Use relaxation techniques to manage stress. Ask your health care provider to suggest some techniques that may work well for you. These may include: Breathing exercises. Routines to release muscle tension. Visualizing peaceful scenes. Make sure that you drive carefully. Do not drive if you feel very sleepy. Keep all follow-up visits. This is important. Contact a health care provider if: You are tired throughout the day. You have trouble in your daily routine due to sleepiness. You continue to have sleep problems, or your sleep problems get worse. Get help right away if: You have thoughts about hurting yourself or someone else. Get help right away if you feel like you may hurt yourself or others, or have thoughts about taking your own life. Go to your nearest emergency room or: Call 911. Call the National Suicide Prevention Lifeline at 1-800-273-8255 or 988. This is open 24 hours a day. Text the Crisis Text Line at 741741. Summary Insomnia is a sleep disorder that makes it difficult to fall asleep or stay asleep. Insomnia can be long-term (chronic) or short-term (acute). Treatment for insomnia depends on the cause. Treatment may focus on treating an underlying condition that is causing the insomnia. Keep a sleep diary to help you and your health care provider figure out what could be causing your insomnia. This information is not intended to replace advice given to you by your health care provider. Make sure you discuss any questions you have with your health care provider. Document Revised: 07/07/2021 Document Reviewed: 07/07/2021 Elsevier Patient Education  2024 [employer]  

## 2023-05-12 ENCOUNTER — Encounter: Payer: Self-pay | Admitting: Physician Assistant

## 2023-05-12 DIAGNOSIS — M25551 Pain in right hip: Secondary | ICD-10-CM | POA: Insufficient documentation

## 2023-05-12 DIAGNOSIS — S0990XA Unspecified injury of head, initial encounter: Secondary | ICD-10-CM | POA: Insufficient documentation

## 2023-05-25 ENCOUNTER — Ambulatory Visit: Payer: Medicare Other | Admitting: Physician Assistant

## 2023-05-26 ENCOUNTER — Encounter: Payer: Self-pay | Admitting: Physician Assistant

## 2023-05-26 MED ORDER — MOLNUPIRAVIR EUA 200MG CAPSULE: 4.0000 | ORAL_CAPSULE | Freq: Two times a day (BID) | ORAL | 0 refills | Status: AC

## 2023-06-01 ENCOUNTER — Ambulatory Visit: Payer: Medicare Other | Admitting: Physician Assistant

## 2023-06-03 ENCOUNTER — Other Ambulatory Visit: Payer: Self-pay | Admitting: Physician Assistant

## 2023-06-03 DIAGNOSIS — F5101 Primary insomnia: Secondary | ICD-10-CM

## 2023-06-04 ENCOUNTER — Other Ambulatory Visit: Payer: Self-pay | Admitting: Physician Assistant

## 2023-06-04 ENCOUNTER — Ambulatory Visit (INDEPENDENT_AMBULATORY_CARE_PROVIDER_SITE_OTHER): Payer: Medicare Other | Admitting: Physician Assistant

## 2023-06-04 ENCOUNTER — Encounter: Payer: Self-pay | Admitting: Physician Assistant

## 2023-06-04 VITALS — BP 138/66 | HR 67 | Ht 64.5 in | Wt 228.2 lb

## 2023-06-04 DIAGNOSIS — Z1231 Encounter for screening mammogram for malignant neoplasm of breast: Secondary | ICD-10-CM

## 2023-06-04 DIAGNOSIS — R2231 Localized swelling, mass and lump, right upper limb: Secondary | ICD-10-CM | POA: Diagnosis not present

## 2023-06-04 NOTE — Progress Notes (Signed)
Acute Office Visit  Subjective:     Patient ID: [name], female    DOB: 12/10/1955, 67 y.o.   MRN: [id number]  Chief Complaint  Patient presents with   nodule Right axillae    Nodule Right axillae x 1 month - patient  had  covid  11 days ago.     HPI Patient is in today for nodule under her right axillae that she noticed about a month ago. She states the nodule has not grown in size and is not painful. She states she did not have any illness around the time she found it. She has not felt any other nodules anywhere else on her body. She is currently getting over COVID for which she tested positive 11 days ago. She states she is doing much better but still has a lingering cough. She denies fever, chills, and sweats.   ROS See HPI     Objective:    BP (!) 145/55   Pulse 67   Ht 5' 4.5" (1.638 m)   Wt 228 lb 4 oz (103.5 kg)   SpO2 96%   BMI 38.57 kg/m  BP Readings from Last 3 Encounters:  06/04/23 (!) 145/55  05/11/23 128/78  05/10/23 (!) 130/58   Wt Readings from Last 3 Encounters:  06/04/23 228 lb 4 oz (103.5 kg)  05/10/23 229 lb (103.9 kg)  03/08/23 227 lb (103 kg)      Physical Exam Constitutional:      Appearance: Normal appearance.  HENT:     Head: Normocephalic and atraumatic.  Cardiovascular:     Rate and Rhythm: Normal rate.  Pulmonary:     Effort: Pulmonary effort is normal.  Musculoskeletal:        General: Normal range of motion.  Lymphadenopathy:     Head:     Right side of head: No submandibular, preauricular or posterior auricular adenopathy.     Left side of head: No submandibular, preauricular or posterior auricular adenopathy.     Cervical: No cervical adenopathy.     Upper Body:     Right upper body: No supraclavicular adenopathy.     Left upper body: No supraclavicular adenopathy.  Skin:    Comments: Pea sized nodule felt under right axillae. Smooth, non tender, and moves freely with skin.   Neurological:     General: No focal  deficit present.     Mental Status: She is alert and oriented to person, place, and time.  Psychiatric:        Mood and Affect: Mood normal.        Behavior: Behavior normal.        Assessment & Plan:  .Elexcia was seen today for nodule right axillae.  Diagnoses and all orders for this visit:  Mass of right axilla -     Cancel: US BREAST COMPLETE UNI RIGHT INC AXILLA -     [place] AXILLA RIGHT  Encounter for screening mammogram for malignant neoplasm of breast -     Cancel: MM 3D SCREENING MAMMOGRAM BILATERAL BREAST    Likely sebaceous cyst. Will get ultrasound of right axillae to confirm. Will follow up once ultrasound results are in.   Due for mammogram in November. Will schedule based off results of ultrasound.   Waiting to get a call to schedule colonoscopy.     Tandy Gaw, PA-C

## 2023-06-05 ENCOUNTER — Other Ambulatory Visit: Payer: Self-pay | Admitting: Physician Assistant

## 2023-06-05 DIAGNOSIS — I493 Ventricular premature depolarization: Secondary | ICD-10-CM

## 2023-06-07 ENCOUNTER — Other Ambulatory Visit: Payer: Self-pay | Admitting: Physician Assistant

## 2023-06-07 DIAGNOSIS — F411 Generalized anxiety disorder: Secondary | ICD-10-CM

## 2023-06-17 ENCOUNTER — Telehealth: Payer: Self-pay | Admitting: Internal Medicine

## 2023-06-17 NOTE — Telephone Encounter (Signed)
Patient is due for repeat screening colonoscopy  Needs to be done at hospital due to prior post-op airway problems  Please schedule For Jan or Feb 2025 (with a previsit)

## 2023-06-18 ENCOUNTER — Other Ambulatory Visit: Payer: Self-pay

## 2023-06-18 DIAGNOSIS — Z1211 Encounter for screening for malignant neoplasm of colon: Secondary | ICD-10-CM

## 2023-06-18 NOTE — Telephone Encounter (Signed)
Pt made aware of Dr. Leone Payor recommendations:  Pt was scheduled for a previsit appointment on 08/06/2023 at 2:00 PM. Pt made aware.  Pt was scheduled for a January appointment  on 09/02/2023 at 7:30 AM at [facility]. Case ID # [id number]  Pt made aware.   Pt verbalized understanding with all questions answered.

## 2023-06-21 ENCOUNTER — Encounter: Payer: Self-pay | Admitting: Hematology & Oncology

## 2023-06-21 ENCOUNTER — Inpatient Hospital Stay: Payer: Medicare Other

## 2023-06-21 ENCOUNTER — Inpatient Hospital Stay (HOSPITAL_BASED_OUTPATIENT_CLINIC_OR_DEPARTMENT_OTHER): Payer: Medicare Other | Admitting: Hematology & Oncology

## 2023-06-21 ENCOUNTER — Inpatient Hospital Stay: Payer: Medicare Other | Attending: Hematology & Oncology

## 2023-06-21 ENCOUNTER — Other Ambulatory Visit: Payer: Self-pay

## 2023-06-21 VITALS — BP 127/59 | HR 58 | Temp 97.8°F | Resp 16 | Ht 64.5 in | Wt 226.0 lb

## 2023-06-21 DIAGNOSIS — Z7982 Long term (current) use of aspirin: Secondary | ICD-10-CM | POA: Insufficient documentation

## 2023-06-21 DIAGNOSIS — Z79899 Other long term (current) drug therapy: Secondary | ICD-10-CM | POA: Insufficient documentation

## 2023-06-21 DIAGNOSIS — D45 Polycythemia vera: Secondary | ICD-10-CM | POA: Insufficient documentation

## 2023-06-21 LAB — CMP (CANCER CENTER ONLY)
ALT: 32 U/L (ref 0–44)
AST: 29 U/L (ref 15–41)
Albumin: 4.8 g/dL (ref 3.5–5.0)
Alkaline Phosphatase: 91 U/L (ref 38–126)
Anion gap: 8 (ref 5–15)
BUN: 15 mg/dL (ref 8–23)
CO2: 28 mmol/L (ref 22–32)
Calcium: 10.6 mg/dL — ABNORMAL HIGH (ref 8.9–10.3)
Chloride: 106 mmol/L (ref 98–111)
Creatinine: 1.36 mg/dL — ABNORMAL HIGH (ref 0.44–1.00)
GFR, Estimated: 42 mL/min — ABNORMAL LOW (ref >60)
Glucose, Bld: 146 mg/dL — ABNORMAL HIGH (ref 70–99)
Potassium: 4.5 mmol/L (ref 3.5–5.1)
Sodium: 142 mmol/L (ref 135–145)
Total Bilirubin: 0.8 mg/dL (ref <1.2)
Total Protein: 7 g/dL (ref 6.5–8.1)

## 2023-06-21 LAB — CBC WITH DIFFERENTIAL (CANCER CENTER ONLY)
Abs Immature Granulocytes: 0.11 10*3/uL — ABNORMAL HIGH (ref 0.00–0.07)
Basophils Absolute: 0.1 10*3/uL (ref 0.0–0.1)
Basophils Relative: 1 %
Eosinophils Absolute: 0.7 10*3/uL — ABNORMAL HIGH (ref 0.0–0.5)
Eosinophils Relative: 5 %
HCT: 42.8 % (ref 36.0–46.0)
Hemoglobin: 11.9 g/dL — ABNORMAL LOW (ref 12.0–15.0)
Immature Granulocytes: 1 %
Lymphocytes Relative: 16 %
Lymphs Abs: 2.1 10*3/uL (ref 0.7–4.0)
MCH: 17.7 pg — ABNORMAL LOW (ref 26.0–34.0)
MCHC: 27.8 g/dL — ABNORMAL LOW (ref 30.0–36.0)
MCV: 63.7 fL — ABNORMAL LOW (ref 80.0–100.0)
Monocytes Absolute: 0.7 10*3/uL (ref 0.1–1.0)
Monocytes Relative: 5 %
Neutro Abs: 9.2 10*3/uL — ABNORMAL HIGH (ref 1.7–7.7)
Neutrophils Relative %: 72 %
Platelet Count: 599 10*3/uL — ABNORMAL HIGH (ref 150–400)
RBC: 6.72 MIL/uL — ABNORMAL HIGH (ref 3.87–5.11)
RDW: 21.1 % — ABNORMAL HIGH (ref 11.5–15.5)
WBC Count: 12.8 10*3/uL — ABNORMAL HIGH (ref 4.0–10.5)
nRBC: 0 % (ref 0.0–0.2)

## 2023-06-21 LAB — IRON AND IRON BINDING CAPACITY (CC-WL,HP ONLY)
Iron: 12 ug/dL — ABNORMAL LOW (ref 28–170)
Saturation Ratios: 2 % — ABNORMAL LOW (ref 10.4–31.8)
TIBC: 549 ug/dL — ABNORMAL HIGH (ref 250–450)
UIBC: 537 ug/dL — ABNORMAL HIGH (ref 148–442)

## 2023-06-21 LAB — LACTATE DEHYDROGENASE: LDH: 366 U/L — ABNORMAL HIGH (ref 98–192)

## 2023-06-21 LAB — FERRITIN: Ferritin: 8 ng/mL — ABNORMAL LOW (ref 11–307)

## 2023-06-21 LAB — SAVE SMEAR(SSMR), FOR PROVIDER SLIDE REVIEW

## 2023-06-21 NOTE — Progress Notes (Signed)
Hematology and Oncology Follow Up Visit  [name] [id number] 1956-02-04 67 y.o. 06/21/2023   Principle Diagnosis:  Polycythemia vera, JAK 2 positive    Current Therapy:        Phlebotomy as indicated to maintain Hct < 45%    Aspirin 81 mg p.o. daily         Interim History:  [name] is here today for follow-up for her polycythemia vera.  She is doing pretty well.  Both she and her husband were veterans.  I thanked him for their service on this veterans day.  She is doing okay on the aspirin.  She really has had no specific complaints.  She is going on ultrasound of the right axilla tomorrow.  Apparently, there is some nodule that was found of there.  When I examined her, I cannot find anything that seem to be suspicious.  Hopefully, it is just a cyst.  We do have to do another ultrasound of her spleen.  The last one was done back in July.  She has had no problems with headache.  She has had no bleeding.  There is no change in bowel or bladder habits.  She has had no leg swelling.  Overall, I would say that her performance status is probably ECOG 0.    Wt Readings from Last 3 Encounters:  06/21/23 226 lb (102.5 kg)  06/04/23 228 lb 4 oz (103.5 kg)  05/10/23 229 lb (103.9 kg)      Medications:  Allergies as of 06/21/2023       Reactions   Percocet [oxycodone-acetaminophen] Other (See Comments)   SYNCOPE TACHYCARDIA   Shrimp [shellfish Allergy] Anaphylaxis, Itching, Nausea And Vomiting, Swelling   Hydrocodone-acetaminophen Nausea And Vomiting, Other (See Comments)   Dizziness, also   Lipitor [atorvastatin] Other (See Comments)   Ear pain/fullness in ear "as if I were under water"   Saxenda [liraglutide -weight Management] Hives, Itching, Other (See Comments)   Hives/itching/site reaction   Ambien [zolpidem]    Sleep walking   Adhesive [tape] Rash, Other (See Comments)   EKG leads and certain bandages break out the skin   Belviq Xr [lorcaserin Hcl Er] Other  (See Comments)   Gained weight    Ferrous Sulfate Nausea Only   Morphine And Codeine Nausea And Vomiting        Medication List        Accurate as of June 21, 2023 11:12 AM. If you have any questions, ask your nurse or doctor.          STOP taking these medications    chlorpheniramine-HYDROcodone 10-8 MG/5ML Commonly known as: TUSSIONEX Stopped by: Josph Macho       TAKE these medications    acetaminophen 325 MG tablet Commonly known as: TYLENOL Take 325-650 mg by mouth every 6 (six) hours as needed for mild pain or headache.   albuterol 108 (90 Base) MCG/ACT inhaler Commonly known as: VENTOLIN HFA Inhale 2 puffs into the lungs every 6 (six) hours as needed for wheezing or shortness of breath.   Aspirin 81 MG Caps Take 81 mg by mouth daily at 6 (six) AM.   B-12 PO Take 1 tablet by mouth daily.   calcium carbonate 500 MG chewable tablet Commonly known as: TUMS - dosed in mg elemental calcium Chew 1 tablet by mouth as needed for indigestion or heartburn.   cetirizine 5 MG tablet Commonly known as: ZYRTEC Take 10 mg by mouth daily.   citalopram  20 MG tablet Commonly known as: CELEXA TAKE 1 TABLET DAILY   levothyroxine 125 MCG tablet Commonly known as: SYNTHROID TAKE 1 TABLET BY MOUTH EVERY DAY BEFORE BREAKFAST   lisinopril 5 MG tablet Commonly known as: ZESTRIL TAKE 1 TABLET (5 MG TOTAL) BY MOUTH DAILY.   LUBRICATING EYE DROPS OP Place 1 drop into both eyes as needed (dry eyes).   metoprolol succinate 25 MG 24 hr tablet Commonly known as: TOPROL-XL TAKE 1 TABLET (25 MG TOTAL) BY MOUTH DAILY.   pantoprazole 40 MG tablet Commonly known as: PROTONIX TAKE 1 TABLET BY MOUTH EVERY DAY   promethazine 25 MG tablet Commonly known as: PHENERGAN TAKE 1 TABLET (25 MG TOTAL) BY MOUTH AS NEEDED.   rosuvastatin 10 MG tablet Commonly known as: CRESTOR TAKE 1 TABLET BY MOUTH EVERY DAY   traMADol 50 MG tablet Commonly known as: ULTRAM Take 50 mg  by mouth every 6 (six) hours as needed.   traZODone 50 MG tablet Commonly known as: DESYREL TAKE 0.5-1 TABLETS BY MOUTH AT BEDTIME AS NEEDED FOR SLEEP.   Vitamin D3 75 MCG (3000 UT) Tabs Take 1,000 Units by mouth daily.        Allergies:  Allergies  Allergen Reactions   Percocet [Oxycodone-Acetaminophen] Other (See Comments)    SYNCOPE TACHYCARDIA   Shrimp [Shellfish Allergy] Anaphylaxis, Itching, Nausea And Vomiting and Swelling   Hydrocodone-Acetaminophen Nausea And Vomiting and Other (See Comments)    Dizziness, also   Lipitor [Atorvastatin] Other (See Comments)    Ear pain/fullness in ear "as if I were under water"   Saxenda [Liraglutide -Weight Management] Hives, Itching and Other (See Comments)    Hives/itching/site reaction   Ambien [Zolpidem]     Sleep walking   Adhesive [Tape] Rash and Other (See Comments)    EKG leads and certain bandages break out the skin   Belviq Xr [Lorcaserin Hcl Er] Other (See Comments)    Gained weight    Ferrous Sulfate Nausea Only   Morphine And Codeine Nausea And Vomiting    Past Medical History, Surgical history, Social history, and Family History were reviewed and updated.  Review of Systems:  Review of Systems  Constitutional: Negative.   HENT: Negative.    Eyes: Negative.   Respiratory: Negative.    Cardiovascular: Negative.   Gastrointestinal: Negative.   Genitourinary: Negative.   Musculoskeletal: Negative.   Skin: Negative.   Neurological: Negative.   Endo/Heme/Allergies: Negative.   Psychiatric/Behavioral: Negative.       Physical Exam:  height is 5' 4.5" (1.638 m) and weight is 226 lb (102.5 kg). Her oral temperature is 97.8 F (36.6 C). Her blood pressure is 127/59 (abnormal) and her pulse is 58 (abnormal). Her respiration is 16 and oxygen saturation is 98%.   Wt Readings from Last 3 Encounters:  06/21/23 226 lb (102.5 kg)  06/04/23 228 lb 4 oz (103.5 kg)  05/10/23 229 lb (103.9 kg)   Physical Exam Vitals  reviewed.  HENT:     Head: Normocephalic and atraumatic.  Eyes:     Pupils: Pupils are equal, round, and reactive to light.  Cardiovascular:     Rate and Rhythm: Normal rate and regular rhythm.     Heart sounds: Normal heart sounds.  Pulmonary:     Effort: Pulmonary effort is normal.     Breath sounds: Normal breath sounds.  Abdominal:     General: Bowel sounds are normal.     Palpations: Abdomen is soft.  Musculoskeletal:  General: No tenderness or deformity. Normal range of motion.     Cervical back: Normal range of motion.  Lymphadenopathy:     Cervical: No cervical adenopathy.  Skin:    General: Skin is warm and dry.     Findings: No erythema or rash.  Neurological:     Mental Status: She is alert and oriented to person, place, and time.  Psychiatric:        Behavior: Behavior normal.        Thought Content: Thought content normal.        Judgment: Judgment normal.     Lab Results  Component Value Date   WBC 12.8 (H) 06/21/2023   HGB 11.9 (L) 06/21/2023   HCT 42.8 06/21/2023   MCV 63.7 (L) 06/21/2023   PLT 599 (H) 06/21/2023   Lab Results  Component Value Date   FERRITIN 7 (L) 05/10/2023   IRON 25 (L) 05/10/2023   TIBC 550 (H) 05/10/2023   UIBC 525 (H) 05/10/2023   IRONPCTSAT 5 (L) 05/10/2023   Lab Results  Component Value Date   RETICCTPCT 2.6 12/17/2021   RBC 6.72 (H) 06/21/2023   No results found for: "KPAFRELGTCHN", "LAMBDASER", "KAPLAMBRATIO" No results found for: "IGGSERUM", "IGA", "IGMSERUM" No results found for: "TOTALPROTELP", "ALBUMINELP", "A1GS", "A2GS", "BETS", "BETA2SER", "GAMS", "MSPIKE", "SPEI"   Chemistry      Component Value Date/Time   NA 142 06/21/2023 0958   K 4.5 06/21/2023 0958   CL 106 06/21/2023 0958   CO2 28 06/21/2023 0958   BUN 15 06/21/2023 0958   CREATININE 1.36 (H) 06/21/2023 0958   CREATININE 1.18 (H) 12/11/2021 1221      Component Value Date/Time   CALCIUM 10.6 (H) 06/21/2023 0958   ALKPHOS 91 06/21/2023  0958   AST 29 06/21/2023 0958   ALT 32 06/21/2023 0958   BILITOT 0.8 06/21/2023 0958     Impression and Plan: Ms. Crumbliss is a very pleasant 67 yo caucasian female with polycythemia.  She is JAK2 positive.    For today, we do not have to phlebotomize her..  She still has a thrombocytosis.  This is a little bit higher.  Some of this might be from her iron deficiency.  I think we still follow her without adding any additional medication.  I will plan to see her back in another month or so.  We will get her back before Christmas.  I will do a splenic ultrasound the same day that I see her.     Josph Macho, MD 11/11/202411:12 AM

## 2023-06-22 ENCOUNTER — Encounter: Payer: Self-pay | Admitting: Family

## 2023-06-22 ENCOUNTER — Ambulatory Visit
Admission: RE | Admit: 2023-06-22 | Discharge: 2023-06-22 | Disposition: A | Discharge status: Home / Self Care | Payer: Medicare Other | Source: Ambulatory Visit | Attending: Physician Assistant | Admitting: Physician Assistant

## 2023-06-22 DIAGNOSIS — R2231 Localized swelling, mass and lump, right upper limb: Secondary | ICD-10-CM

## 2023-06-22 NOTE — Progress Notes (Signed)
Cyst as suspected in right axilla! GREAT news.

## 2023-07-15 ENCOUNTER — Encounter: Payer: Self-pay | Admitting: Family

## 2023-07-19 ENCOUNTER — Encounter: Payer: Self-pay | Admitting: *Deleted

## 2023-07-19 ENCOUNTER — Ambulatory Visit (HOSPITAL_BASED_OUTPATIENT_CLINIC_OR_DEPARTMENT_OTHER)
Admission: RE | Admit: 2023-07-19 | Discharge: 2023-07-19 | Disposition: A | Discharge status: Home / Self Care | Payer: Medicare Other | Source: Ambulatory Visit | Attending: Hematology & Oncology | Admitting: Hematology & Oncology

## 2023-07-19 DIAGNOSIS — D45 Polycythemia vera: Secondary | ICD-10-CM | POA: Diagnosis present

## 2023-07-20 ENCOUNTER — Inpatient Hospital Stay (HOSPITAL_BASED_OUTPATIENT_CLINIC_OR_DEPARTMENT_OTHER): Payer: Medicare Other | Admitting: Hematology & Oncology

## 2023-07-20 ENCOUNTER — Encounter: Payer: Self-pay | Admitting: Hematology & Oncology

## 2023-07-20 ENCOUNTER — Other Ambulatory Visit: Payer: Self-pay

## 2023-07-20 ENCOUNTER — Inpatient Hospital Stay: Payer: Medicare Other | Attending: Hematology & Oncology

## 2023-07-20 VITALS — BP 106/51 | HR 55 | Resp 18 | Ht 64.5 in | Wt 225.0 lb

## 2023-07-20 DIAGNOSIS — D75839 Thrombocytosis, unspecified: Secondary | ICD-10-CM | POA: Diagnosis not present

## 2023-07-20 DIAGNOSIS — D45 Polycythemia vera: Secondary | ICD-10-CM | POA: Insufficient documentation

## 2023-07-20 DIAGNOSIS — Z7982 Long term (current) use of aspirin: Secondary | ICD-10-CM | POA: Insufficient documentation

## 2023-07-20 LAB — CMP (CANCER CENTER ONLY)
ALT: 23 U/L (ref 0–44)
AST: 27 U/L (ref 15–41)
Albumin: 4.2 g/dL (ref 3.5–5.0)
Alkaline Phosphatase: 82 U/L (ref 38–126)
Anion gap: 8 (ref 5–15)
BUN: 16 mg/dL (ref 8–23)
CO2: 26 mmol/L (ref 22–32)
Calcium: 10 mg/dL (ref 8.9–10.3)
Chloride: 108 mmol/L (ref 98–111)
Creatinine: 1.26 mg/dL — ABNORMAL HIGH (ref 0.44–1.00)
GFR, Estimated: 47 mL/min — ABNORMAL LOW (ref >60)
Glucose, Bld: 115 mg/dL — ABNORMAL HIGH (ref 70–99)
Potassium: 4.2 mmol/L (ref 3.5–5.1)
Sodium: 142 mmol/L (ref 135–145)
Total Bilirubin: 0.7 mg/dL (ref <1.2)
Total Protein: 6.6 g/dL (ref 6.5–8.1)

## 2023-07-20 LAB — FERRITIN: Ferritin: 8 ng/mL — ABNORMAL LOW (ref 11–307)

## 2023-07-20 LAB — LACTATE DEHYDROGENASE: LDH: 327 U/L — ABNORMAL HIGH (ref 98–192)

## 2023-07-20 LAB — CBC WITH DIFFERENTIAL (CANCER CENTER ONLY)
Abs Immature Granulocytes: 0.07 10*3/uL (ref 0.00–0.07)
Basophils Absolute: 0.1 10*3/uL (ref 0.0–0.1)
Basophils Relative: 1 %
Eosinophils Absolute: 0.6 10*3/uL — ABNORMAL HIGH (ref 0.0–0.5)
Eosinophils Relative: 4 %
HCT: 40.8 % (ref 36.0–46.0)
Hemoglobin: 11.3 g/dL — ABNORMAL LOW (ref 12.0–15.0)
Immature Granulocytes: 1 %
Lymphocytes Relative: 15 %
Lymphs Abs: 1.9 10*3/uL (ref 0.7–4.0)
MCH: 17.8 pg — ABNORMAL LOW (ref 26.0–34.0)
MCHC: 27.7 g/dL — ABNORMAL LOW (ref 30.0–36.0)
MCV: 64.3 fL — ABNORMAL LOW (ref 80.0–100.0)
Monocytes Absolute: 0.7 10*3/uL (ref 0.1–1.0)
Monocytes Relative: 5 %
Neutro Abs: 9.6 10*3/uL — ABNORMAL HIGH (ref 1.7–7.7)
Neutrophils Relative %: 74 %
Platelet Count: 624 10*3/uL — ABNORMAL HIGH (ref 150–400)
RBC: 6.35 MIL/uL — ABNORMAL HIGH (ref 3.87–5.11)
RDW: 21.1 % — ABNORMAL HIGH (ref 11.5–15.5)
WBC Count: 13 10*3/uL — ABNORMAL HIGH (ref 4.0–10.5)
nRBC: 0 % (ref 0.0–0.2)

## 2023-07-20 LAB — IRON AND IRON BINDING CAPACITY (CC-WL,HP ONLY)
Iron: 23 ug/dL — ABNORMAL LOW (ref 28–170)
Saturation Ratios: 4 % — ABNORMAL LOW (ref 10.4–31.8)
TIBC: 526 ug/dL — ABNORMAL HIGH (ref 250–450)
UIBC: 503 ug/dL — ABNORMAL HIGH (ref 148–442)

## 2023-07-20 NOTE — Progress Notes (Signed)
Hematology and Oncology Follow Up Visit  CARIANN WEITMAN [id number] Nov 25, 1955 67 y.o. 07/20/2023   Principle Diagnosis:  Polycythemia vera, JAK 2 positive    Current Therapy:        Phlebotomy as indicated to maintain Hct < 45%    Aspirin 81 mg p.o. daily         Interim History:  Ms. Maurer is here today for follow-up for her polycythemia vera.  We last saw her back in November.  We did go ahead and do a ultrasound of her spleen.  Thankfully, the spleen is about a third less than size.  I think was about 636 cm.  She has had no problems with pain.  There is been no headache.  She has had no fever.  There has been no change in bowel or bladder habits.  She has had no bleeding.  Her iron studies on we saw her back in November showed a ferritin of 8 with an iron saturation of 2%.  Currently, I would have to say that her performance status is probably ECOG 0.     Wt Readings from Last 3 Encounters:  07/20/23 225 lb (102.1 kg)  06/21/23 226 lb (102.5 kg)  06/04/23 228 lb 4 oz (103.5 kg)      Medications:  Allergies as of 07/20/2023       Reactions   Percocet [oxycodone-acetaminophen] Other (See Comments)   SYNCOPE TACHYCARDIA   Shrimp [shellfish Allergy] Anaphylaxis, Itching, Nausea And Vomiting, Swelling   Hydrocodone-acetaminophen Nausea And Vomiting, Other (See Comments)   Dizziness, also   Lipitor [atorvastatin] Other (See Comments)   Ear pain/fullness in ear "as if I were under water"   Saxenda [liraglutide -weight Management] Hives, Itching, Other (See Comments)   Hives/itching/site reaction   Ambien [zolpidem]    Sleep walking   Adhesive [tape] Rash, Other (See Comments)   EKG leads and certain bandages break out the skin   Belviq Xr [lorcaserin Hcl Er] Other (See Comments)   Gained weight    Ferrous Sulfate Nausea Only   Morphine And Codeine Nausea And Vomiting        Medication List        Accurate as of July 20, 2023  1:39 PM. If you have  any questions, ask your nurse or doctor.          acetaminophen 325 MG tablet Commonly known as: TYLENOL Take 325-650 mg by mouth every 6 (six) hours as needed for mild pain or headache.   albuterol 108 (90 Base) MCG/ACT inhaler Commonly known as: VENTOLIN HFA Inhale 2 puffs into the lungs every 6 (six) hours as needed for wheezing or shortness of breath.   Aspirin 81 MG Caps Take 81 mg by mouth daily at 6 (six) AM.   B-12 PO Take 1 tablet by mouth daily.   calcium carbonate 500 MG chewable tablet Commonly known as: TUMS - dosed in mg elemental calcium Chew 1 tablet by mouth as needed for indigestion or heartburn.   cetirizine 5 MG tablet Commonly known as: ZYRTEC Take 10 mg by mouth daily.   citalopram 20 MG tablet Commonly known as: CELEXA TAKE 1 TABLET DAILY   levothyroxine 125 MCG tablet Commonly known as: SYNTHROID TAKE 1 TABLET BY MOUTH EVERY DAY BEFORE BREAKFAST   lisinopril 5 MG tablet Commonly known as: ZESTRIL TAKE 1 TABLET (5 MG TOTAL) BY MOUTH DAILY.   LUBRICATING EYE DROPS OP Place 1 drop into both eyes as needed (dry eyes).  metoprolol succinate 25 MG 24 hr tablet Commonly known as: TOPROL-XL TAKE 1 TABLET (25 MG TOTAL) BY MOUTH DAILY.   pantoprazole 40 MG tablet Commonly known as: PROTONIX TAKE 1 TABLET BY MOUTH EVERY DAY   promethazine 25 MG tablet Commonly known as: PHENERGAN TAKE 1 TABLET (25 MG TOTAL) BY MOUTH AS NEEDED.   rosuvastatin 10 MG tablet Commonly known as: CRESTOR TAKE 1 TABLET BY MOUTH EVERY DAY   traMADol 50 MG tablet Commonly known as: ULTRAM Take 50 mg by mouth every 6 (six) hours as needed.   traZODone 50 MG tablet Commonly known as: DESYREL TAKE 0.5-1 TABLETS BY MOUTH AT BEDTIME AS NEEDED FOR SLEEP.   Vitamin D3 75 MCG (3000 UT) Tabs Take 1,000 Units by mouth daily.        Allergies:  Allergies  Allergen Reactions   Percocet [Oxycodone-Acetaminophen] Other (See Comments)    SYNCOPE TACHYCARDIA    Shrimp [Shellfish Allergy] Anaphylaxis, Itching, Nausea And Vomiting and Swelling   Hydrocodone-Acetaminophen Nausea And Vomiting and Other (See Comments)    Dizziness, also   Lipitor [Atorvastatin] Other (See Comments)    Ear pain/fullness in ear "as if I were under water"   Saxenda [Liraglutide -Weight Management] Hives, Itching and Other (See Comments)    Hives/itching/site reaction   Ambien [Zolpidem]     Sleep walking   Adhesive [Tape] Rash and Other (See Comments)    EKG leads and certain bandages break out the skin   Belviq Xr [Lorcaserin Hcl Er] Other (See Comments)    Gained weight    Ferrous Sulfate Nausea Only   Morphine And Codeine Nausea And Vomiting    Past Medical History, Surgical history, Social history, and Family History were reviewed and updated.  Review of Systems:  Review of Systems  Constitutional: Negative.   HENT: Negative.    Eyes: Negative.   Respiratory: Negative.    Cardiovascular: Negative.   Gastrointestinal: Negative.   Genitourinary: Negative.   Musculoskeletal: Negative.   Skin: Negative.   Neurological: Negative.   Endo/Heme/Allergies: Negative.   Psychiatric/Behavioral: Negative.       Physical Exam:  height is 5' 4.5" (1.638 m) and weight is 225 lb (102.1 kg). Her blood pressure is 106/51 (abnormal) and her pulse is 55 (abnormal). Her respiration is 18 and oxygen saturation is 100%.   Wt Readings from Last 3 Encounters:  07/20/23 225 lb (102.1 kg)  06/21/23 226 lb (102.5 kg)  06/04/23 228 lb 4 oz (103.5 kg)   Physical Exam Vitals reviewed.  HENT:     Head: Normocephalic and atraumatic.  Eyes:     Pupils: Pupils are equal, round, and reactive to light.  Cardiovascular:     Rate and Rhythm: Normal rate and regular rhythm.     Heart sounds: Normal heart sounds.  Pulmonary:     Effort: Pulmonary effort is normal.     Breath sounds: Normal breath sounds.  Abdominal:     General: Bowel sounds are normal.     Palpations: Abdomen  is soft.  Musculoskeletal:        General: No tenderness or deformity. Normal range of motion.     Cervical back: Normal range of motion.  Lymphadenopathy:     Cervical: No cervical adenopathy.  Skin:    General: Skin is warm and dry.     Findings: No erythema or rash.  Neurological:     Mental Status: She is alert and oriented to person, place, and time.  Psychiatric:  Behavior: Behavior normal.        Thought Content: Thought content normal.        Judgment: Judgment normal.     Lab Results  Component Value Date   WBC 13.0 (H) 07/20/2023   HGB 11.3 (L) 07/20/2023   HCT 40.8 07/20/2023   MCV 64.3 (L) 07/20/2023   PLT 624 (H) 07/20/2023   Lab Results  Component Value Date   FERRITIN 8 (L) 06/21/2023   IRON 12 (L) 06/21/2023   TIBC 549 (H) 06/21/2023   UIBC 537 (H) 06/21/2023   IRONPCTSAT 2 (L) 06/21/2023   Lab Results  Component Value Date   RETICCTPCT 2.6 12/17/2021   RBC 6.35 (H) 07/20/2023   No results found for: "KPAFRELGTCHN", "LAMBDASER", "KAPLAMBRATIO" No results found for: "IGGSERUM", "IGA", "IGMSERUM" No results found for: "TOTALPROTELP", "ALBUMINELP", "A1GS", "A2GS", "BETS", "BETA2SER", "GAMS", "MSPIKE", "SPEI"   Chemistry      Component Value Date/Time   NA 142 07/20/2023 1210   K 4.2 07/20/2023 1210   CL 108 07/20/2023 1210   CO2 26 07/20/2023 1210   BUN 16 07/20/2023 1210   CREATININE 1.26 (H) 07/20/2023 1210   CREATININE 1.18 (H) 12/11/2021 1221      Component Value Date/Time   CALCIUM 10.0 07/20/2023 1210   ALKPHOS 82 07/20/2023 1210   AST 27 07/20/2023 1210   ALT 23 07/20/2023 1210   BILITOT 0.7 07/20/2023 1210     Impression and Plan: Ms. Mckinzie is a very pleasant 67 yo caucasian female with polycythemia.  She is JAK2 positive.    For today, we do not have to phlebotomize her..  She still has a thrombocytosis.  This is a little bit higher.  Some of this might be from her iron deficiency.  At some point, we may have to consider  some type of marrow suppressive agent.  I probably would use Hydrea for her.  I think we try to move her appointments out a little bit longer.  Will try to get her back in 2 months now.   Josph Macho, MD 12/10/20241:39 PM

## 2023-07-25 ENCOUNTER — Telehealth: Payer: Self-pay | Admitting: *Deleted

## 2023-07-25 NOTE — Telephone Encounter (Signed)
Yesi,   This pt is a documented difficult intubation and her procedure will need to be done at the hospital.   Thanks,  [place] Marney 

## 2023-07-26 NOTE — Telephone Encounter (Signed)
Patient is currently scheduled at [facility] for this procedure;

## 2023-08-06 ENCOUNTER — Telehealth: Payer: Self-pay

## 2023-08-06 ENCOUNTER — Ambulatory Visit (AMBULATORY_SURGERY_CENTER): Payer: Medicare Other

## 2023-08-06 ENCOUNTER — Telehealth: Payer: Self-pay | Admitting: Internal Medicine

## 2023-08-06 VITALS — Ht 64.5 in | Wt 220.0 lb

## 2023-08-06 DIAGNOSIS — Z1211 Encounter for screening for malignant neoplasm of colon: Secondary | ICD-10-CM

## 2023-08-06 NOTE — Telephone Encounter (Signed)
Pt had PV earlier and pt states she has no questions at this time.

## 2023-08-06 NOTE — Telephone Encounter (Signed)
Patient called and stated that she is returning your call. Patient is requesting a call back. Please advise.

## 2023-08-06 NOTE — Telephone Encounter (Signed)
Pre visit complete °

## 2023-08-06 NOTE — Progress Notes (Signed)

## 2023-08-25 ENCOUNTER — Encounter (HOSPITAL_COMMUNITY): Payer: Self-pay | Admitting: Internal Medicine

## 2023-08-25 NOTE — Progress Notes (Signed)
 Attempted to obtain medical history for pre op call via telephone, unable to reach at this time. HIPAA compliant voicemail message left requesting return call to pre surgical testing department.

## 2023-08-27 ENCOUNTER — Encounter: Payer: Self-pay | Admitting: Family

## 2023-08-30 ENCOUNTER — Encounter: Payer: Self-pay | Admitting: Family

## 2023-08-30 ENCOUNTER — Ambulatory Visit (INDEPENDENT_AMBULATORY_CARE_PROVIDER_SITE_OTHER): Payer: Medicare Other | Admitting: Physician Assistant

## 2023-08-30 ENCOUNTER — Encounter: Payer: Self-pay | Admitting: Physician Assistant

## 2023-08-30 VITALS — BP 120/46 | HR 55 | Ht 64.0 in | Wt 225.0 lb

## 2023-08-30 DIAGNOSIS — M545 Low back pain, unspecified: Secondary | ICD-10-CM

## 2023-08-30 DIAGNOSIS — M51362 Other intervertebral disc degeneration, lumbar region with discogenic back pain and lower extremity pain: Secondary | ICD-10-CM

## 2023-08-30 DIAGNOSIS — M79672 Pain in left foot: Secondary | ICD-10-CM

## 2023-08-30 MED ORDER — PREDNISONE 50 MG PO TABS: ORAL_TABLET | ORAL | 0 refills | Status: DC

## 2023-08-30 MED ORDER — METHOCARBAMOL 500 MG PO TABS: 500.0000 mg | ORAL_TABLET | Freq: Three times a day (TID) | ORAL | 0 refills | Status: DC

## 2023-08-30 NOTE — Progress Notes (Signed)
Established Patient Office Visit  Subjective   Patient ID: [name], female    DOB: 02/13/1956  Age: 68 y.o. MRN: [id number]  Chief Complaint  Patient presents with   Medical Management of Chronic Issues     Right sciatica pain, onset 2 mo  pain is on 7 today pt decibes the pain as constant, Left foot pain by pink toe onset 3 weeks. Pt states she has a scheduled colonoscopy Thursday.    HPI  Pt is a 68 yo female with a history of mitral regurgitation, HTN, DDD, presenting to the clinic today with complaints of right sided sciatica and left foot pain.   She states that the right hip pain has been ongoing for the last two months. The pt did go to the ER after having a fall on her right hip back in 04/2023. Imaging was done and results were benign. Since then she sustained another fall on her buttocks in mid December. She states that the pain is located on the underside of her right buttock and she describes it as a sharp, burning pain that is constant. She rates it a 7/10 on the pain scale. The pain does not radiate down her leg. Pt states that she tried taking Tylenol to help relieve the pain but it was ineffective. However, heat therapy and her massage chair have helped reduce the pain slightly. She denies urinary or bowel incontinence, urinary retention or saddle anesthesia.  Pt is also complaining of left sided foot pain located at the base of her fifth metatarsal. She states this has been going on for the last 3 weeks. She denies any prior injury to this area. She describes this pain as sharp and constant especially after walking. At its worst she rates the pain a 6/10, at rest the pain is a 2/10. The pain does not radiate. She states that she has a hx of bone spurs and believes this may be the cause of her pain.  Review of Systems  Musculoskeletal:  Positive for falls.       Right buttock pain Left foot pain- base of 5th metatarsal Falls in 04/2023 and 07/2023  All other systems  reviewed and are negative.    Objective:     BP (!) 120/46   Pulse (!) 55   Ht 5\' 4"  (1.626 m)   Wt 225 lb (102.1 kg)   SpO2 99%   BMI 38.62 kg/m  BP Readings from Last 3 Encounters:  08/30/23 (!) 120/46  07/20/23 (!) 106/51  06/21/23 (!) 127/59   Wt Readings from Last 3 Encounters:  08/30/23 225 lb (102.1 kg)  08/06/23 220 lb (99.8 kg)  07/20/23 225 lb (102.1 kg)     Physical Exam Constitutional:      Appearance: Normal appearance. She is obese.  HENT:     Head: Normocephalic.  Cardiovascular:     Rate and Rhythm: Normal rate and regular rhythm.     Pulses: Normal pulses.     Heart sounds: Normal heart sounds.  Pulmonary:     Effort: Pulmonary effort is normal.     Breath sounds: Normal breath sounds.  Musculoskeletal:        General: Tenderness present. No deformity.     Right lower leg: No edema.     Left lower leg: No edema.     Comments: Underside of right buttock TTP  Skin:    General: Skin is warm and dry.  Neurological:     General:  No focal deficit present.     Mental Status: She is alert and oriented to person, place, and time.     The 10-year ASCVD risk score (Arnett DK, et al., 2019) is: 8.9%    Assessment & Plan:   Zohie was seen today for medical management of chronic issues.  Diagnoses and all orders for this visit:  Degeneration of intervertebral disc of lumbar region with discogenic back pain and lower extremity pain -     methocarbamol (ROBAXIN) 500 MG tablet; Take 1 tablet (500 mg total) by mouth 3 (three) times daily. -     predniSONE (DELTASONE) 50 MG tablet; Take one tablet daily for 5 days. (Hold until after colonoscopy)  Left foot pain -     DG Foot Complete Left; Future  Acute right-sided low back pain without sciatica -     methocarbamol (ROBAXIN) 500 MG tablet; Take 1 tablet (500 mg total) by mouth 3 (three) times daily. -     predniSONE (DELTASONE) 50 MG tablet; Take one tablet daily for 5 days. (Hold until after  colonoscopy)   Robaxin sent to pharmacy Piriformis exercises/stretches given to help with muscle pain/tension in the piriformis area Look into massage therapy or using massage chair for buttock muscle tension and pain Use a tennis ball for muscle rolling on buttock Use Icy-Hot cream as needed for buttock muscle soreness and foot pain Will consider PT in future if not improving  Post op walking boot to offload pressure on left foot X-rays ordered for left foot pain to rule out fracture and look for bone spur or injury  Prednisone sent to pharmacy for inflammation. Hold prednisone until after colonoscopy on Thursday, 09/02/23  Tandy Gaw, PA-C

## 2023-09-01 ENCOUNTER — Other Ambulatory Visit: Payer: Self-pay | Admitting: Physician Assistant

## 2023-09-01 DIAGNOSIS — I10 Essential (primary) hypertension: Secondary | ICD-10-CM

## 2023-09-02 ENCOUNTER — Ambulatory Visit (HOSPITAL_BASED_OUTPATIENT_CLINIC_OR_DEPARTMENT_OTHER): Payer: Self-pay | Admitting: Anesthesiology

## 2023-09-02 ENCOUNTER — Encounter (HOSPITAL_COMMUNITY)
Admission: RE | Disposition: A | Discharge status: Home / Self Care | Payer: Self-pay | Source: Home / Self Care | Attending: Internal Medicine

## 2023-09-02 ENCOUNTER — Other Ambulatory Visit: Payer: Self-pay

## 2023-09-02 ENCOUNTER — Ambulatory Visit (HOSPITAL_COMMUNITY)
Admission: RE | Admit: 2023-09-02 | Discharge: 2023-09-02 | Disposition: A | Discharge status: Home / Self Care | Payer: Medicare Other | Attending: Internal Medicine | Admitting: Internal Medicine

## 2023-09-02 ENCOUNTER — Ambulatory Visit (HOSPITAL_COMMUNITY): Payer: Self-pay | Admitting: Anesthesiology

## 2023-09-02 ENCOUNTER — Encounter (HOSPITAL_COMMUNITY): Payer: Self-pay | Admitting: Internal Medicine

## 2023-09-02 DIAGNOSIS — G4730 Sleep apnea, unspecified: Secondary | ICD-10-CM | POA: Insufficient documentation

## 2023-09-02 DIAGNOSIS — K219 Gastro-esophageal reflux disease without esophagitis: Secondary | ICD-10-CM | POA: Diagnosis not present

## 2023-09-02 DIAGNOSIS — K649 Unspecified hemorrhoids: Secondary | ICD-10-CM | POA: Diagnosis not present

## 2023-09-02 DIAGNOSIS — Z1211 Encounter for screening for malignant neoplasm of colon: Secondary | ICD-10-CM | POA: Diagnosis present

## 2023-09-02 DIAGNOSIS — Z87891 Personal history of nicotine dependence: Secondary | ICD-10-CM | POA: Insufficient documentation

## 2023-09-02 DIAGNOSIS — N189 Chronic kidney disease, unspecified: Secondary | ICD-10-CM | POA: Diagnosis not present

## 2023-09-02 DIAGNOSIS — I341 Nonrheumatic mitral (valve) prolapse: Secondary | ICD-10-CM | POA: Insufficient documentation

## 2023-09-02 DIAGNOSIS — I129 Hypertensive chronic kidney disease with stage 1 through stage 4 chronic kidney disease, or unspecified chronic kidney disease: Secondary | ICD-10-CM | POA: Diagnosis not present

## 2023-09-02 DIAGNOSIS — D45 Polycythemia vera: Secondary | ICD-10-CM | POA: Insufficient documentation

## 2023-09-02 HISTORY — PX: COLONOSCOPY WITH PROPOFOL: SHX5780

## 2023-09-02 SURGERY — COLONOSCOPY WITH PROPOFOL
Anesthesia: Monitor Anesthesia Care

## 2023-09-02 MED ORDER — PROPOFOL 500 MG/50ML IV EMUL
INTRAVENOUS | Status: AC
  Filled 2023-09-02: qty 50

## 2023-09-02 MED ORDER — PROPOFOL 1000 MG/100ML IV EMUL
INTRAVENOUS | Status: AC
  Filled 2023-09-02: qty 100

## 2023-09-02 MED ORDER — LIDOCAINE HCL (CARDIAC) PF 100 MG/5ML IV SOSY
PREFILLED_SYRINGE | INTRAVENOUS | Status: DC | PRN
  Administered 2023-09-02: 80 mg via INTRAVENOUS

## 2023-09-02 MED ORDER — SODIUM CHLORIDE 0.9 % IV SOLN: INTRAVENOUS | Status: DC

## 2023-09-02 MED ORDER — PROPOFOL 500 MG/50ML IV EMUL
INTRAVENOUS | Status: DC | PRN
  Administered 2023-09-02: 80 ug/kg/min via INTRAVENOUS
  Administered 2023-09-02: 50 mg via INTRAVENOUS
  Administered 2023-09-02: 20 mg via INTRAVENOUS

## 2023-09-02 MED ORDER — PROPOFOL 1000 MG/100ML IV EMUL
INTRAVENOUS | Status: AC
  Filled 2023-09-02: qty 300

## 2023-09-02 SURGICAL SUPPLY — 21 items
ELECT REM PT RETURN 9FT ADLT (ELECTROSURGICAL) IMPLANT
ELECTRODE REM PT RTRN 9FT ADLT (ELECTROSURGICAL) IMPLANT
FCP BXJMBJMB 240X2.8X (CUTTING FORCEPS)
FLOOR PAD 36X40 (MISCELLANEOUS) ×1 IMPLANT
FORCEPS BIOP RAD 4 LRG CAP 4 (CUTTING FORCEPS) IMPLANT
FORCEPS BIOP RJ4 240 W/NDL (CUTTING FORCEPS) IMPLANT
FORCEPS BXJMBJMB 240X2.8X (CUTTING FORCEPS) IMPLANT
INJECTOR/SNARE I SNARE (MISCELLANEOUS) IMPLANT
LUBRICANT JELLY 4.5OZ STERILE (MISCELLANEOUS) IMPLANT
MANIFOLD NEPTUNE II (INSTRUMENTS) IMPLANT
NDL SCLEROTHERAPY 25GX240 (NEEDLE) IMPLANT
NEEDLE SCLEROTHERAPY 25GX240 (NEEDLE) IMPLANT
PAD FLOOR 36X40 (MISCELLANEOUS) ×1 IMPLANT
PROBE APC STR FIRE (PROBE) IMPLANT
PROBE INJECTION GOLD 7FR (MISCELLANEOUS) IMPLANT
SNARE ROTATE MED OVAL 20MM (MISCELLANEOUS) IMPLANT
SYR 50ML LL SCALE MARK (SYRINGE) IMPLANT
TRAP SPECIMEN MUCOUS 40CC (MISCELLANEOUS) IMPLANT
TUBING ENDO SMARTCAP PENTAX (MISCELLANEOUS) IMPLANT
TUBING IRRIGATION ENDOGATOR (MISCELLANEOUS) ×1 IMPLANT
WATER STERILE IRR 1000ML POUR (IV SOLUTION) IMPLANT

## 2023-09-02 NOTE — Anesthesia Preprocedure Evaluation (Signed)
Anesthesia Evaluation  Patient identified by MRN, date of birth, ID band Patient awake    Reviewed: Allergy & Precautions, NPO status , Patient's Chart, lab work & pertinent test results  History of Anesthesia Complications (+) PONV, DIFFICULT AIRWAY and history of anesthetic complications  Airway Mallampati: IV  TM Distance: >3 FB Neck ROM: Limited  Mouth opening: Limited Mouth Opening  Dental  (+) Dental Advisory Given   Pulmonary asthma , sleep apnea and Continuous Positive Airway Pressure Ventilation , former smoker   breath sounds clear to auscultation       Cardiovascular hypertension, Pt. on medications and Pt. on home beta blockers + dysrhythmias  Rhythm:Regular Rate:Normal     Neuro/Psych  Headaches  Neuromuscular disease    GI/Hepatic Neg liver ROS,GERD  ,,  Endo/Other  Hypothyroidism    Renal/GU Renal disease     Musculoskeletal  (+) Arthritis ,    Abdominal   Peds  Hematology  (+) Blood dyscrasia, anemia   Anesthesia Other Findings   Reproductive/Obstetrics                             Anesthesia Physical Anesthesia Plan  ASA: 2  Anesthesia Plan: MAC   Post-op Pain Management:    Induction:   PONV Risk Score and Plan: 3 and Propofol infusion  Airway Management Planned: Natural Airway and Simple Face Mask  Additional Equipment: None  Intra-op Plan:   Post-operative Plan:   Informed Consent: I have reviewed the patients History and Physical, chart, labs and discussed the procedure including the risks, benefits and alternatives for the proposed anesthesia with the patient or authorized representative who has indicated his/her understanding and acceptance.       Plan Discussed with: CRNA  Anesthesia Plan Comments:        Anesthesia Quick Evaluation

## 2023-09-02 NOTE — Anesthesia Procedure Notes (Signed)
Procedure Name: General with mask airway Date/Time: 09/02/2023 7:36 AM  Performed by: Floydene Flock, CRNAPre-anesthesia Checklist: Patient identified, Emergency Drugs available, Suction available, Patient being monitored and Timeout performed Patient Re-evaluated:Patient Re-evaluated prior to induction Oxygen Delivery Method: Circle system utilized Preoxygenation: Pre-oxygenation with 100% oxygen Induction Type: IV induction Placement Confirmation: positive ETCO2

## 2023-09-02 NOTE — H&P (Signed)
Gastroenterology History and Physical   Primary Care Physician:  Nolene Ebbs   Reason for Procedure:   Colon cancer screening  Plan:    colonoscopy     HPI: [name] is a 68 y.o. female presenting for a screening colonoscopy today. Last exam 2014 w/o neoplasia.   Past Medical History:  Diagnosis Date   Acute medial meniscal tear    Right knee   Allergy    Anxiety    Aortic insufficiency    Mild   Aortic regurgitation    Arthritis    Asthma    Cancer [facility] [place]) May 2023   Polycythemia Vera   Carpal tunnel syndrome    Cataract 2022   Chronic kidney disease    one kidney    Dysrhythmia    pvc's at times   GERD (gastroesophageal reflux disease)    Heart murmur 1985   MVP   Hyperlipidemia    Hypertension 2015   Hypothyroidism    Migraine headache    Mitral regurgitation    Mitral valve prolapse    Nausea alone    Need for SBE (subacute bacterial endocarditis) prophylaxis    Pilonidal cyst without mention of abscess    Pneumonia 08/14/2012   Polycythemia vera (HCC) 03/08/2023   PONV (postoperative nausea and vomiting)    states she vasovagals   Sleep apnea    wears CPAP   Thyroid disease    Transplanted kidney removed    donated kidney    Past Surgical History:  Procedure Laterality Date   ABDOMINAL HYSTERECTOMY     ANTERIOR CERVICAL DECOMP/DISCECTOMY FUSION  05/28/2020   CARPAL TUNNEL RELEASE Right    CARPAL TUNNEL RELEASE Left 02/25/2017   Procedure: LEFT OPEN CARPAL TUNNEL RELEASE;  Surgeon: Kathryne Hitch, MD;  Location: [facility] OR;  Service: Orthopedics;  Laterality: Left;   CARPAL TUNNEL RELEASE Left 1998   CARPAL TUNNEL RELEASE Right 2019   COLONOSCOPY     JOINT REPLACEMENT  2015   RTKA   KNEE ARTHROSCOPY Right 01/06/2018   Procedure: RIGHT KNEE ARTHROSCOPY WITH PARTIAL  MEDIAL MENISCECTOMY;  Surgeon: Kathryne Hitch, MD;  Location: [facility] OR;  Service: Orthopedics;  Laterality: Right;   KNEE SURGERY      LARYNGOSCOPY N/A 05/30/2020   Procedure: LARYNGOSCOPY;  Surgeon: Newman Pies, MD;  Location: [facility] OR;  Service: ENT;  Laterality: N/A;   NEPHRECTOMY     PARTIAL KNEE ARTHROPLASTY Right 03/31/2018   Procedure: RIGHT KNEE UNI ARTHROPLASTY;  Surgeon: Teryl Lucy, MD;  Location: [facility];  Service: Orthopedics;  Laterality: Right;   SPINE SURGERY  Oct 2021   ACDF   TONSILLECTOMY     TOTAL KNEE ARTHROPLASTY Left 09/14/2013   Procedure: LEFT TOTAL KNEE ARTHROPLASTY;  Surgeon: Kathryne Hitch, MD;  Location: [facility] OR;  Service: Orthopedics;  Laterality: Left;   TUBAL LIGATION     WISDOM TOOTH EXTRACTION     WOUND EXPLORATION N/A 05/28/2020   Procedure: ANTERIOR CERVICAL WOUND EXPLORATION;  Surgeon: Maeola Harman, MD;  Location: [facility] OR;  Service: Neurosurgery;  Laterality: N/A;    Prior to Admission medications   Medication Sig Start Date End Date Taking? Authorizing Provider  Aspirin 81 MG CAPS Take 81 mg by mouth daily at 6 (six) AM. 12/17/21  Yes [provider]  cetirizine (ZYRTEC) 5 MG tablet Take 10 mg by mouth daily.   Yes [provider]  Cholecalciferol (VITAMIN D3) 3000 units TABS Take 1,000 Units  by mouth daily.    Yes [provider]  citalopram (CELEXA) 20 MG tablet TAKE 1 TABLET DAILY 06/08/23  Yes Breeback, Jade L, PA-C  Cyanocobalamin (B-12 PO) Take 1 tablet by mouth daily.    Yes [provider]  levothyroxine (SYNTHROID) 125 MCG tablet TAKE 1 TABLET BY MOUTH EVERY DAY BEFORE BREAKFAST 03/19/23  Yes Breeback, Jade L, PA-C  lisinopril (ZESTRIL) 5 MG tablet TAKE 1 TABLET (5 MG TOTAL) BY MOUTH DAILY. 09/01/23  Yes Breeback, Jade L, PA-C  metoprolol succinate (TOPROL-XL) 25 MG 24 hr tablet TAKE 1 TABLET (25 MG TOTAL) BY MOUTH DAILY. 06/08/23  Yes Breeback, Jade L, PA-C  pantoprazole (PROTONIX) 40 MG tablet TAKE 1 TABLET BY MOUTH EVERY DAY 09/18/22  Yes Breeback, Jade L, PA-C  promethazine (PHENERGAN) 25 MG tablet TAKE 1 TABLET (25 MG  TOTAL) BY MOUTH AS NEEDED. 04/22/23  Yes Breeback, Jade L, PA-C  rosuvastatin (CRESTOR) 10 MG tablet TAKE 1 TABLET BY MOUTH EVERY DAY 12/10/22  Yes Breeback, Jade L, PA-C  traZODone (DESYREL) 50 MG tablet TAKE 0.5-1 TABLETS BY MOUTH AT BEDTIME AS NEEDED FOR SLEEP. 06/04/23  Yes Breeback, Jade L, PA-C  acetaminophen (TYLENOL) 325 MG tablet Take 325-650 mg by mouth every 6 (six) hours as needed for mild pain or headache.    [provider]  albuterol (VENTOLIN HFA) 108 (90 Base) MCG/ACT inhaler Inhale 2 puffs into the lungs every 6 (six) hours as needed for wheezing or shortness of breath. 04/21/22   Icard, Rachel Bo, DO  calcium carbonate (TUMS - DOSED IN MG ELEMENTAL CALCIUM) 500 MG chewable tablet Chew 1 tablet by mouth as needed for indigestion or heartburn.    [provider]  Carboxymethylcellul-Glycerin (LUBRICATING EYE DROPS OP) Place 1 drop into both eyes as needed (dry eyes).     [provider]  methocarbamol (ROBAXIN) 500 MG tablet Take 1 tablet (500 mg total) by mouth 3 (three) times daily. 08/30/23   Breeback, Jade L, PA-C  predniSONE (DELTASONE) 50 MG tablet Take one tablet daily for 5 days. (Hold until after colonoscopy) 08/30/23   Jomarie Longs, PA-C  traMADol (ULTRAM) 50 MG tablet Take 50 mg by mouth every 6 (six) hours as needed. 05/04/23   [provider]    Current Facility-Administered Medications  Medication Dose Route Frequency Provider Last Rate Last Admin   0.9 %  sodium chloride infusion   Intravenous Continuous Iva Boop, MD        Allergies as of 06/18/2023 - Review Complete 06/04/2023  Allergen Reaction Noted   Percocet [oxycodone-acetaminophen] Other (See Comments) 07/14/2012   Shrimp [shellfish allergy] Anaphylaxis, Itching, Nausea And Vomiting, and Swelling 02/23/2017   Hydrocodone-acetaminophen Nausea And Vomiting and Other (See Comments) 01/04/2015   Lipitor [atorvastatin] Other (See Comments) 11/22/2014   Saxenda  [liraglutide -weight management] Hives, Itching, and Other (See Comments) 06/16/2017   Ambien [zolpidem]  05/11/2023   Adhesive [tape] Rash and Other (See Comments) 05/28/2020   Belviq xr [lorcaserin hcl er] Other (See Comments) 10/19/2017   Ferrous sulfate Nausea Only 03/10/2021   Morphine and codeine Nausea And Vomiting 07/14/2012    Family History  Problem Relation Age of Onset   Diabetes Mother    Hypertension Mother    Heart disease Father    Hypertension Father    Thyroid disease Father    AAA (abdominal aortic aneurysm) Father    Arthritis Father    Diabetes Sister    Fibromyalgia Sister  Obstructive Sleep Apnea Sister    Arthritis Sister    Asthma Sister    Hypertension Sister    Miscarriages / Stillbirths Sister    Obesity Sister    Cancer Sister    Breast cancer Sister 61   Cancer Maternal Aunt    Cancer Maternal Aunt    Stroke Paternal Grandmother    Kidney disease Daughter    Hearing loss Daughter    Obesity Daughter    Colon polyps Daughter    Heart disease Son    Breast cancer Cousin        maternal first cousin   Colon cancer Neg Hx    Esophageal cancer Neg Hx    Rectal cancer Neg Hx    Stomach cancer Neg Hx     Social History   Socioeconomic History   Marital status: Married    Spouse name: Tom   Number of children: 7   Years of education: 18   Highest education level: Manufacturing engineer (e.g., MA, MS, MEng, MEd, MSW, MBA)  Occupational History    Employer: [employer]   Occupation: Retired  Tobacco Use   Smoking status: Former    Current packs/day: 0.00    Average packs/day: 0.3 packs/day for 5.0 years (1.3 ttl pk-yrs)    Types: Cigarettes    Start date: 11/22/1978    Quit date: 11/22/1983    Years since quitting: 39.8   Smokeless tobacco: Never   Tobacco comments:    Did not smoke daily  Vaping Use   Vaping status: Never Used  Substance and Sexual Activity   Alcohol use: Yes    Comment: Rare maybe once a month   Drug use: No    Sexual activity: Yes    Partners: Male    Birth control/protection: Post-menopausal  Other Topics Concern   Not on file  Social History Narrative   Lives with spouse. She has 7 children. She enjoys quilting and reading.   Social Drivers of Corporate investment banker Strain: Low Risk  (06/03/2023)   Overall Financial Resource Strain (CARDIA)    Difficulty of Paying Living Expenses: Not hard at all  Food Insecurity: No Food Insecurity (06/03/2023)   Hunger Vital Sign    Worried About Running Out of Food in the Last Year: Never true    Ran Out of Food in the Last Year: Never true  Transportation Needs: No Transportation Needs (06/03/2023)   PRAPARE - Administrator, Civil Service (Medical): No    Lack of Transportation (Non-Medical): No  Physical Activity: Insufficiently Active (06/03/2023)   Exercise Vital Sign    Days of Exercise per Week: 2 days    Minutes of Exercise per Session: 30 min  Stress: No Stress Concern Present (06/03/2023)   [employer] of Occupational Health - Occupational Stress Questionnaire    Feeling of Stress : Not at all  Social Connections: Moderately Integrated (06/03/2023)   Social Connection and Isolation Panel [NHANES]    Frequency of Communication with Friends and Family: More than three times a week    Frequency of Social Gatherings with Friends and Family: Twice a week    Attends Religious Services: 1 to 4 times per year    Active Member of [employer] or Organizations: No    Attends Banker Meetings: Never    Marital Status: Married  Catering manager Violence: Not At Risk (11/24/2022)   Humiliation, Afraid, Rape, and Kick questionnaire    Fear of Current  or Ex-Partner: No    Emotionally Abused: No    Physically Abused: No    Sexually Abused: No    Review of Systems: Positive for low back pain , + piriformis syndrome, left foot pain All other review of systems negative except as mentioned in the HPI.  Physical  Exam: Vital signs BP (!) 147/68   Temp (!) 96.3 F (35.7 C) (Temporal)   Resp 14   Ht 5\' 4"  (1.626 m)   Wt 102.1 kg   SpO2 100%   BMI 38.62 kg/m   General:   Alert,  Well-developed, well-nourished, pleasant and cooperative in NAD Lungs:  Clear throughout to auscultation.   Heart:  Regular rate and rhythm; no murmurs, clicks, rubs,  or gallops. Abdomen:  Soft, nontender and nondistended. Normal bowel sounds. Obese   Neuro/Psych:  Alert and cooperative. Normal mood and affect. A and O x 3   @Ellorie Kindall  Sena Slate, MD, [facility] [phone] (pager) 09/02/2023 7:09 AM@

## 2023-09-02 NOTE — Transfer of Care (Signed)
Immediate Anesthesia Transfer of Care Note  Patient: [name]  Procedure(s) Performed: COLONOSCOPY WITH PROPOFOL  Patient Location: PACU and Endoscopy Unit  Anesthesia Type:General  Level of Consciousness: awake, alert , and oriented  Airway & Oxygen Therapy: Patient Spontanous Breathing  Post-op Assessment: Report given to RN and Post -op Vital signs reviewed and stable  Post vital signs: Reviewed and stable  Last Vitals:  Vitals Value Taken Time  BP    Temp    Pulse 60 09/02/23 0804  Resp 19 09/02/23 0804  SpO2 95 % 09/02/23 0804  Vitals shown include unfiled device data.  Last Pain:  Vitals:   09/02/23 0655  TempSrc: Temporal         Complications: No notable events documented.

## 2023-09-02 NOTE — Op Note (Signed)
[facility] Patient Name: [name] Procedure Date: 09/02/2023 MRN: [id number] Attending MD: Iva Boop , MD, [id number] Date of Birth: Feb 22, 1956 CSN: [id number] Age: 68 Admit Type: Outpatient Procedure:                Colonoscopy Indications:              Screening for colorectal malignant neoplasm, Last                            colonoscopy: 2014 Providers:                Iva Boop, MD, Zoe Lan, RN, Alan Ripper,                            Technician Referring MD:              Medicines:                Monitored Anesthesia Care Complications:            No immediate complications. Estimated Blood Loss:     Estimated blood loss: none. Procedure:                Pre-Anesthesia Assessment:                           - Prior to the procedure, a History and Physical                            was performed, and patient medications and                            allergies were reviewed. The patient's tolerance of                            previous anesthesia was also reviewed. The risks                            and benefits of the procedure and the sedation                            options and risks were discussed with the patient.                            All questions were answered, and informed consent                            was obtained. Prior Anticoagulants: The patient has                            taken no anticoagulant or antiplatelet agents. ASA                            Grade Assessment: II - A patient with mild systemic  disease. After reviewing the risks and benefits,                            the patient was deemed in satisfactory condition to                            undergo the procedure.                           After obtaining informed consent, the colonoscope                            was passed under direct vision. Throughout the                            procedure, the patient's blood  pressure, pulse, and                            oxygen saturations were monitored continuously. The                            [id number]-HQ190L ([id number]) Olympus colonoscope was                            introduced through the anus and advanced to the the                            cecum, identified by appendiceal orifice and                            ileocecal valve. The colonoscopy was performed                            without difficulty. The patient tolerated the                            procedure well. The quality of the bowel                            preparation was good. The ileocecal valve,                            appendiceal orifice, and rectum were photographed.                            The bowel preparation used was Miralax via split                            dose instruction. Scope In: 7:38:03 AM Scope Out: 7:54:30 AM Scope Withdrawal Time: 0 hours 14 minutes 29 seconds  Total Procedure Duration: 0 hours 16 minutes 27 seconds  Findings:      Hemorrhoids were found on perianal exam.      The entire examined colon appeared normal on direct and retroflexion       views. Impression:               -  Hemorrhoids found on perianal exam.                           - The entire examined colon is normal on direct and                            retroflexion views.                           - No specimens collected. Moderate Sedation:      Not Applicable - Patient had care per Anesthesia. Recommendation:           - Patient has a contact number available for                            emergencies. The signs and symptoms of potential                            delayed complications were discussed with the                            patient. Return to normal activities tomorrow.                            Written discharge instructions were provided to the                            patient.                           - Resume previous diet.                           - Continue  present medications.                           - No repeat colonoscopy due to current age (11                            years or older) and the absence of colonic polyps. Procedure Code(s):        --- Professional ---                           A5409, Colorectal cancer screening; colonoscopy on                            individual not meeting criteria for high risk Diagnosis Code(s):        --- Professional ---                           Z12.11, Encounter for screening for malignant                            neoplasm of colon  K64.9, Unspecified hemorrhoids CPT copyright 2022 American Medical Association. All rights reserved. The codes documented in this report are preliminary and upon coder review may  be revised to meet current compliance requirements. Iva Boop, MD 09/02/2023 8:04:05 AM This report has been signed electronically. Number of Addenda: 0

## 2023-09-02 NOTE — Discharge Instructions (Signed)
No polyps or cancer were seen!  I will leave it to you to see if you want to do another colonoscopy at age 68 - not going to automatically remind.  I appreciate the opportunity to care for you. Iva Boop, MD, FACG  YOU HAD AN ENDOSCOPIC PROCEDURE TODAY: Refer to the procedure report and other information in the discharge instructions given to you for any specific questions about what was found during the examination. If this information does not answer your questions, please call Dr. Marvell Fuller office at [phone] to clarify.   YOU SHOULD EXPECT: Some feelings of bloating in the abdomen. Passage of more gas than usual. Walking can help get rid of the air that was put into your GI tract during the procedure and reduce the bloating. If you had a lower endoscopy (such as a colonoscopy or flexible sigmoidoscopy) you may notice spotting of blood in your stool or on the toilet paper. Some abdominal soreness may be present for a day or two, also.  DIET: Your first meal following the procedure should be a light meal and then it is ok to progress to your normal diet. A half-sandwich or bowl of soup is an example of a good first meal. Heavy or fried foods are harder to digest and may make you feel nauseous or bloated. Drink plenty of fluids but you should avoid alcoholic beverages for 24 hours.   ACTIVITY: Your care partner should take you home directly after the procedure. You should plan to take it easy, moving slowly for the rest of the day. You can resume normal activity the day after the procedure however YOU SHOULD NOT DRIVE, use power tools, machinery or perform tasks that involve climbing or major physical exertion for 24 hours (because of the sedation medicines used during the test).   SYMPTOMS TO REPORT IMMEDIATELY: A gastroenterologist can be reached at any hour. Please call [phone]  for any of the following symptoms:  Following lower endoscopy (colonoscopy, flexible  sigmoidoscopy) Excessive amounts of blood in the stool  Significant tenderness, worsening of abdominal pains  Swelling of the abdomen that is new, acute  Fever of 100 or higher

## 2023-09-03 ENCOUNTER — Other Ambulatory Visit: Payer: Self-pay | Admitting: Physician Assistant

## 2023-09-03 DIAGNOSIS — K2100 Gastro-esophageal reflux disease with esophagitis, without bleeding: Secondary | ICD-10-CM

## 2023-09-04 ENCOUNTER — Other Ambulatory Visit: Payer: Self-pay | Admitting: Physician Assistant

## 2023-09-04 DIAGNOSIS — I493 Ventricular premature depolarization: Secondary | ICD-10-CM

## 2023-09-06 ENCOUNTER — Encounter (HOSPITAL_COMMUNITY): Payer: Self-pay | Admitting: Internal Medicine

## 2023-09-06 NOTE — Anesthesia Postprocedure Evaluation (Signed)
Anesthesia Post Note  Patient: [name]  Procedure(s) Performed: COLONOSCOPY WITH PROPOFOL     Patient location during evaluation: PACU Anesthesia Type: MAC Level of consciousness: awake and alert Pain management: pain level controlled Vital Signs Assessment: post-procedure vital signs reviewed and stable Respiratory status: spontaneous breathing, nonlabored ventilation, respiratory function stable and patient connected to nasal cannula oxygen Cardiovascular status: stable and blood pressure returned to baseline Postop Assessment: no apparent nausea or vomiting Anesthetic complications: no   No notable events documented.  Last Vitals:  Vitals:   09/02/23 0821 09/02/23 0830  BP: (!) 146/59 (!) 165/61  Pulse: (!) 59 (!) 53  Resp: 19 17  Temp:    SpO2: 97% 96%    Last Pain:  Vitals:   09/03/23 1533  TempSrc:   PainSc: 0-No pain                 Kennieth Rad

## 2023-09-09 ENCOUNTER — Other Ambulatory Visit: Payer: Self-pay | Admitting: Physician Assistant

## 2023-09-09 ENCOUNTER — Encounter: Payer: Self-pay | Admitting: Physician Assistant

## 2023-09-09 DIAGNOSIS — I493 Ventricular premature depolarization: Secondary | ICD-10-CM

## 2023-09-10 ENCOUNTER — Other Ambulatory Visit: Payer: Self-pay | Admitting: Physician Assistant

## 2023-09-10 DIAGNOSIS — E039 Hypothyroidism, unspecified: Secondary | ICD-10-CM

## 2023-09-13 ENCOUNTER — Ambulatory Visit: Payer: Medicare Other

## 2023-09-13 DIAGNOSIS — M2012 Hallux valgus (acquired), left foot: Secondary | ICD-10-CM

## 2023-09-13 DIAGNOSIS — M7732 Calcaneal spur, left foot: Secondary | ICD-10-CM | POA: Diagnosis not present

## 2023-09-13 DIAGNOSIS — M19072 Primary osteoarthritis, left ankle and foot: Secondary | ICD-10-CM

## 2023-09-13 DIAGNOSIS — M79672 Pain in left foot: Secondary | ICD-10-CM

## 2023-09-14 ENCOUNTER — Encounter: Payer: Self-pay | Admitting: Physician Assistant

## 2023-09-14 NOTE — Progress Notes (Signed)
5th metatarsal joint is unremarkable but there is some chronic inflammation change at the base of pinky toe and likely causing the pain. Is it still hurting. Lots of heel spurs and bunion noted.

## 2023-09-15 ENCOUNTER — Inpatient Hospital Stay (HOSPITAL_BASED_OUTPATIENT_CLINIC_OR_DEPARTMENT_OTHER): Payer: Medicare Other | Admitting: Hematology & Oncology

## 2023-09-15 ENCOUNTER — Encounter: Payer: Self-pay | Admitting: Hematology & Oncology

## 2023-09-15 ENCOUNTER — Inpatient Hospital Stay: Payer: Medicare Other | Attending: Hematology & Oncology

## 2023-09-15 VITALS — BP 143/54 | HR 52 | Temp 98.1°F | Resp 20 | Ht 64.0 in | Wt 226.0 lb

## 2023-09-15 DIAGNOSIS — D45 Polycythemia vera: Secondary | ICD-10-CM

## 2023-09-15 DIAGNOSIS — Z7982 Long term (current) use of aspirin: Secondary | ICD-10-CM | POA: Diagnosis not present

## 2023-09-15 LAB — CBC WITH DIFFERENTIAL (CANCER CENTER ONLY)
Abs Immature Granulocytes: 0.15 10*3/uL — ABNORMAL HIGH (ref 0.00–0.07)
Basophils Absolute: 0.1 10*3/uL (ref 0.0–0.1)
Basophils Relative: 1 %
Eosinophils Absolute: 0.5 10*3/uL (ref 0.0–0.5)
Eosinophils Relative: 4 %
HCT: 40.1 % (ref 36.0–46.0)
Hemoglobin: 11.3 g/dL — ABNORMAL LOW (ref 12.0–15.0)
Immature Granulocytes: 1 %
Lymphocytes Relative: 14 %
Lymphs Abs: 1.9 10*3/uL (ref 0.7–4.0)
MCH: 17.6 pg — ABNORMAL LOW (ref 26.0–34.0)
MCHC: 28.2 g/dL — ABNORMAL LOW (ref 30.0–36.0)
MCV: 62.6 fL — ABNORMAL LOW (ref 80.0–100.0)
Monocytes Absolute: 0.7 10*3/uL (ref 0.1–1.0)
Monocytes Relative: 5 %
Neutro Abs: 10.3 10*3/uL — ABNORMAL HIGH (ref 1.7–7.7)
Neutrophils Relative %: 75 %
Platelet Count: 543 10*3/uL — ABNORMAL HIGH (ref 150–400)
RBC: 6.41 MIL/uL — ABNORMAL HIGH (ref 3.87–5.11)
RDW: 21.3 % — ABNORMAL HIGH (ref 11.5–15.5)
WBC Count: 13.6 10*3/uL — ABNORMAL HIGH (ref 4.0–10.5)
nRBC: 0 % (ref 0.0–0.2)

## 2023-09-15 LAB — CMP (CANCER CENTER ONLY)
ALT: 19 U/L (ref 0–44)
AST: 18 U/L (ref 15–41)
Albumin: 4.3 g/dL (ref 3.5–5.0)
Alkaline Phosphatase: 77 U/L (ref 38–126)
Anion gap: 7 (ref 5–15)
BUN: 13 mg/dL (ref 8–23)
CO2: 29 mmol/L (ref 22–32)
Calcium: 10.2 mg/dL (ref 8.9–10.3)
Chloride: 105 mmol/L (ref 98–111)
Creatinine: 1.16 mg/dL — ABNORMAL HIGH (ref 0.44–1.00)
GFR, Estimated: 51 mL/min — ABNORMAL LOW (ref >60)
Glucose, Bld: 113 mg/dL — ABNORMAL HIGH (ref 70–99)
Potassium: 4.5 mmol/L (ref 3.5–5.1)
Sodium: 141 mmol/L (ref 135–145)
Total Bilirubin: 0.7 mg/dL (ref 0.0–1.2)
Total Protein: 6.4 g/dL — ABNORMAL LOW (ref 6.5–8.1)

## 2023-09-15 LAB — IRON AND IRON BINDING CAPACITY (CC-WL,HP ONLY)
Iron: 21 ug/dL — ABNORMAL LOW (ref 28–170)
Saturation Ratios: 4 % — ABNORMAL LOW (ref 10.4–31.8)
TIBC: 515 ug/dL — ABNORMAL HIGH (ref 250–450)
UIBC: 494 ug/dL — ABNORMAL HIGH (ref 148–442)

## 2023-09-15 LAB — SAVE SMEAR(SSMR), FOR PROVIDER SLIDE REVIEW

## 2023-09-15 LAB — FERRITIN: Ferritin: 10 ng/mL — ABNORMAL LOW (ref 11–307)

## 2023-09-15 LAB — LACTATE DEHYDROGENASE: LDH: 251 U/L — ABNORMAL HIGH (ref 98–192)

## 2023-09-15 NOTE — Progress Notes (Signed)
 Hematology and Oncology Follow Up Visit  [name] [id number] [birth year] 68 y.o. 09/15/2023   Principle Diagnosis:  Polycythemia vera, JAK 2 positive    Current Therapy:        Phlebotomy as indicated to maintain Hct < 45%    Aspirin  81 mg p.o. daily         Interim History:  [name] is here today for follow-up for her polycythemia vera.  She is doing quite well.  She had no problems over the Holiday season.  She is busy quilting.  She has no recent pictures of her quilts.  She is incredibly talented.  She has had no issues with the aspirin .  She had a fever 1 night but it went away the next day.  She has had no problems with nausea or vomiting.  There has been no change in bowel or bladder habits.  She has had no rashes.  She has had no bleeding..  She had a colonoscopy I think a week ago.  Everything looked fine.  She does not need another colonoscopy for 10 years.  Her iron  studies that were done back in December showed a ferritin of 8 with an iron  saturation of 4%.  There has been no headache.  She has had no mouth sores.  She has not noted any swollen lymph nodes.  There is been no pain in her hands or feet.  Overall, I would say that her performance status is probably ECOG 1.    Wt Readings from Last 3 Encounters:  09/15/23 226 lb (102.5 kg)  09/02/23 225 lb (102.1 kg)  08/30/23 225 lb (102.1 kg)      Medications:  Allergies as of 09/15/2023       Reactions   Percocet [oxycodone -acetaminophen ] Other (See Comments)   SYNCOPE TACHYCARDIA   Shrimp [shellfish Allergy] Anaphylaxis, Itching, Nausea And Vomiting, Swelling   Hydrocodone -acetaminophen  Nausea And Vomiting, Other (See Comments)   Dizziness, also   Lipitor [atorvastatin ] Other (See Comments)   Ear pain/fullness in ear as if I were under water    Saxenda  [liraglutide  -weight Management] Hives, Itching, Other (See Comments)   Hives/itching/site reaction   Ambien  [zolpidem ]    Sleep walking    Adhesive [tape] Rash, Other (See Comments)   EKG leads and certain bandages break out the skin   Belviq Xr [lorcaserin  Hcl Er] Other (See Comments)   Gained weight    Ferrous Sulfate  Nausea Only   Morphine  And Codeine Nausea And Vomiting        Medication List        Accurate as of September 15, 2023  1:11 PM. If you have any questions, ask your nurse or doctor.          STOP taking these medications    predniSONE  50 MG tablet Commonly known as: DELTASONE  Stopped by: Maude JONELLE Crease       TAKE these medications    acetaminophen  325 MG tablet Commonly known as: TYLENOL  Take 325-650 mg by mouth every 6 (six) hours as needed for mild pain or headache.   albuterol  108 (90 Base) MCG/ACT inhaler Commonly known as: VENTOLIN  HFA Inhale 2 puffs into the lungs every 6 (six) hours as needed for wheezing or shortness of breath.   Aspirin  81 MG Caps Take 81 mg by mouth daily at 6 (six) AM.   B-12 PO Take 1 tablet by mouth daily.   calcium  carbonate 500 MG chewable tablet Commonly known as: TUMS - dosed in  mg elemental calcium  Chew 1 tablet by mouth as needed for indigestion or heartburn.   cetirizine  5 MG tablet Commonly known as: ZYRTEC  Take 10 mg by mouth daily.   cholecalciferol  25 MCG (1000 UNIT) tablet Commonly known as: VITAMIN D3 Take 1,000 Units by mouth daily.   citalopram  20 MG tablet Commonly known as: CELEXA  TAKE 1 TABLET DAILY   levothyroxine  125 MCG tablet Commonly known as: SYNTHROID  TAKE 1 TABLET BY MOUTH EVERY DAY BEFORE BREAKFAST   lisinopril  5 MG tablet Commonly known as: ZESTRIL  TAKE 1 TABLET (5 MG TOTAL) BY MOUTH DAILY.   LUBRICATING EYE DROPS OP Place 1 drop into both eyes as needed (dry eyes).   methocarbamol  500 MG tablet Commonly known as: ROBAXIN  Take 1 tablet (500 mg total) by mouth 3 (three) times daily.   metoprolol  succinate 25 MG 24 hr tablet Commonly known as: TOPROL -XL TAKE 1 TABLET (25 MG TOTAL) BY MOUTH DAILY.    pantoprazole  40 MG tablet Commonly known as: PROTONIX  TAKE 1 TABLET BY MOUTH EVERY DAY   promethazine  25 MG tablet Commonly known as: PHENERGAN  TAKE 1 TABLET (25 MG TOTAL) BY MOUTH AS NEEDED.   rosuvastatin  10 MG tablet Commonly known as: CRESTOR  TAKE 1 TABLET BY MOUTH EVERY DAY   traMADol  50 MG tablet Commonly known as: ULTRAM  Take 50 mg by mouth every 6 (six) hours as needed.   traZODone  50 MG tablet Commonly known as: DESYREL  TAKE 0.5-1 TABLETS BY MOUTH AT BEDTIME AS NEEDED FOR SLEEP.        Allergies:  Allergies  Allergen Reactions   Percocet [Oxycodone -Acetaminophen ] Other (See Comments)    SYNCOPE TACHYCARDIA   Shrimp [Shellfish Allergy] Anaphylaxis, Itching, Nausea And Vomiting and Swelling   Hydrocodone -Acetaminophen  Nausea And Vomiting and Other (See Comments)    Dizziness, also   Lipitor [Atorvastatin ] Other (See Comments)    Ear pain/fullness in ear as if I were under water    Saxenda  [Liraglutide  -Weight Management] Hives, Itching and Other (See Comments)    Hives/itching/site reaction   Ambien  [Zolpidem ]     Sleep walking   Adhesive [Tape] Rash and Other (See Comments)    EKG leads and certain bandages break out the skin   Belviq Xr [Lorcaserin  Hcl Er] Other (See Comments)    Gained weight    Ferrous Sulfate  Nausea Only   Morphine  And Codeine Nausea And Vomiting    Past Medical History, Surgical history, Social history, and Family History were reviewed and updated.  Review of Systems:  Review of Systems  Constitutional: Negative.   HENT: Negative.    Eyes: Negative.   Respiratory: Negative.    Cardiovascular: Negative.   Gastrointestinal: Negative.   Genitourinary: Negative.   Musculoskeletal: Negative.   Skin: Negative.   Neurological: Negative.   Endo/Heme/Allergies: Negative.   Psychiatric/Behavioral: Negative.       Physical Exam:  height is 5' 4 (1.626 m) and weight is 226 lb (102.5 kg). Her oral temperature is 98.1 F (36.7  C). Her blood pressure is 143/54 (abnormal) and her pulse is 52 (abnormal). Her respiration is 20 and oxygen saturation is 100%.   Wt Readings from Last 3 Encounters:  09/15/23 226 lb (102.5 kg)  09/02/23 225 lb (102.1 kg)  08/30/23 225 lb (102.1 kg)   Physical Exam Vitals reviewed.  HENT:     Head: Normocephalic and atraumatic.  Eyes:     Pupils: Pupils are equal, round, and reactive to light.  Cardiovascular:     Rate and Rhythm:  Normal rate and regular rhythm.     Heart sounds: Normal heart sounds.  Pulmonary:     Effort: Pulmonary effort is normal.     Breath sounds: Normal breath sounds.  Abdominal:     General: Bowel sounds are normal.     Palpations: Abdomen is soft.  Musculoskeletal:        General: No tenderness or deformity. Normal range of motion.     Cervical back: Normal range of motion.  Lymphadenopathy:     Cervical: No cervical adenopathy.  Skin:    General: Skin is warm and dry.     Findings: No erythema or rash.  Neurological:     Mental Status: She is alert and oriented to person, place, and time.  Psychiatric:        Behavior: Behavior normal.        Thought Content: Thought content normal.        Judgment: Judgment normal.     Lab Results  Component Value Date   WBC 13.6 (H) 09/15/2023   HGB 11.3 (L) 09/15/2023   HCT 40.1 09/15/2023   MCV 62.6 (L) 09/15/2023   PLT 543 (H) 09/15/2023   Lab Results  Component Value Date   FERRITIN 8 (L) 07/20/2023   IRON  23 (L) 07/20/2023   TIBC 526 (H) 07/20/2023   UIBC 503 (H) 07/20/2023   IRONPCTSAT 4 (L) 07/20/2023   Lab Results  Component Value Date   RETICCTPCT 2.6 12/17/2021   RBC 6.41 (H) 09/15/2023   No results found for: KPAFRELGTCHN, LAMBDASER, KAPLAMBRATIO No results found for: IGGSERUM, IGA, IGMSERUM No results found for: STEPHANY CARLOTA BENSON MARKEL EARLA JOANNIE DOC VICK, SPEI   Chemistry      Component Value Date/Time   NA 141 09/15/2023  1221   K 4.5 09/15/2023 1221   CL 105 09/15/2023 1221   CO2 29 09/15/2023 1221   BUN 13 09/15/2023 1221   CREATININE 1.16 (H) 09/15/2023 1221   CREATININE 1.18 (H) 12/11/2021 1221      Component Value Date/Time   CALCIUM  10.2 09/15/2023 1221   ALKPHOS 77 09/15/2023 1221   AST 18 09/15/2023 1221   ALT 19 09/15/2023 1221   BILITOT 0.7 09/15/2023 1221     Impression and Plan: [name] is a very pleasant 68 yo caucasian female with polycythemia.  She is JAK2 positive.    For today, we do not have to phlebotomize her..  She still has a thrombocytosis.  However, this is to be a little bit better.  Overall, I think things are really holding stable.  Will plan to get her back in the Spring now.  Will plan to get her back in April.  When we get her back, we will have an ultrasound of her spleen.    Maude JONELLE Crease, MD 2/5/20251:11 PM

## 2023-11-01 ENCOUNTER — Other Ambulatory Visit: Payer: Self-pay | Admitting: Physician Assistant

## 2023-11-01 DIAGNOSIS — M51362 Other intervertebral disc degeneration, lumbar region with discogenic back pain and lower extremity pain: Secondary | ICD-10-CM

## 2023-11-01 DIAGNOSIS — M545 Low back pain, unspecified: Secondary | ICD-10-CM

## 2023-11-24 ENCOUNTER — Telehealth: Payer: Self-pay | Admitting: Pharmacy Technician

## 2023-11-24 ENCOUNTER — Other Ambulatory Visit (HOSPITAL_COMMUNITY): Payer: Self-pay

## 2023-11-24 ENCOUNTER — Encounter: Payer: Self-pay | Admitting: Family

## 2023-11-24 ENCOUNTER — Inpatient Hospital Stay: Payer: Medicare Other | Attending: Hematology & Oncology

## 2023-11-24 ENCOUNTER — Ambulatory Visit (HOSPITAL_BASED_OUTPATIENT_CLINIC_OR_DEPARTMENT_OTHER)
Admission: RE | Admit: 2023-11-24 | Discharge: 2023-11-24 | Disposition: A | Discharge status: Home / Self Care | Payer: Medicare Other | Source: Ambulatory Visit | Attending: Hematology & Oncology | Admitting: Hematology & Oncology

## 2023-11-24 ENCOUNTER — Inpatient Hospital Stay: Payer: Medicare Other | Admitting: Hematology & Oncology

## 2023-11-24 ENCOUNTER — Telehealth: Payer: Self-pay | Admitting: Pharmacist

## 2023-11-24 VITALS — BP 127/53 | HR 56 | Temp 98.0°F | Resp 18 | Wt 220.0 lb

## 2023-11-24 DIAGNOSIS — D45 Polycythemia vera: Secondary | ICD-10-CM | POA: Insufficient documentation

## 2023-11-24 DIAGNOSIS — Z79899 Other long term (current) drug therapy: Secondary | ICD-10-CM | POA: Diagnosis not present

## 2023-11-24 DIAGNOSIS — Z7982 Long term (current) use of aspirin: Secondary | ICD-10-CM | POA: Insufficient documentation

## 2023-11-24 LAB — CBC WITH DIFFERENTIAL (CANCER CENTER ONLY)
Abs Immature Granulocytes: 0.1 10*3/uL — ABNORMAL HIGH (ref 0.00–0.07)
Basophils Absolute: 0.1 10*3/uL (ref 0.0–0.1)
Basophils Relative: 1 %
Eosinophils Absolute: 0.6 10*3/uL — ABNORMAL HIGH (ref 0.0–0.5)
Eosinophils Relative: 5 %
HCT: 42.7 % (ref 36.0–46.0)
Hemoglobin: 12 g/dL (ref 12.0–15.0)
Immature Granulocytes: 1 %
Lymphocytes Relative: 14 %
Lymphs Abs: 1.8 10*3/uL (ref 0.7–4.0)
MCH: 17.8 pg — ABNORMAL LOW (ref 26.0–34.0)
MCHC: 28.1 g/dL — ABNORMAL LOW (ref 30.0–36.0)
MCV: 63.2 fL — ABNORMAL LOW (ref 80.0–100.0)
Monocytes Absolute: 0.6 10*3/uL (ref 0.1–1.0)
Monocytes Relative: 5 %
Neutro Abs: 10 10*3/uL — ABNORMAL HIGH (ref 1.7–7.7)
Neutrophils Relative %: 74 %
Platelet Count: 670 10*3/uL — ABNORMAL HIGH (ref 150–400)
RBC: 6.76 MIL/uL — ABNORMAL HIGH (ref 3.87–5.11)
RDW: 22.3 % — ABNORMAL HIGH (ref 11.5–15.5)
Smear Review: NORMAL
WBC Count: 13.3 10*3/uL — ABNORMAL HIGH (ref 4.0–10.5)
nRBC: 0 % (ref 0.0–0.2)

## 2023-11-24 LAB — CMP (CANCER CENTER ONLY)
ALT: 21 U/L (ref 0–44)
AST: 23 U/L (ref 15–41)
Albumin: 4.5 g/dL (ref 3.5–5.0)
Alkaline Phosphatase: 89 U/L (ref 38–126)
Anion gap: 8 (ref 5–15)
BUN: 22 mg/dL (ref 8–23)
CO2: 27 mmol/L (ref 22–32)
Calcium: 10.2 mg/dL (ref 8.9–10.3)
Chloride: 107 mmol/L (ref 98–111)
Creatinine: 1.21 mg/dL — ABNORMAL HIGH (ref 0.44–1.00)
GFR, Estimated: 49 mL/min — ABNORMAL LOW (ref >60)
Glucose, Bld: 121 mg/dL — ABNORMAL HIGH (ref 70–99)
Potassium: 4.3 mmol/L (ref 3.5–5.1)
Sodium: 142 mmol/L (ref 135–145)
Total Bilirubin: 0.7 mg/dL (ref 0.0–1.2)
Total Protein: 6.4 g/dL — ABNORMAL LOW (ref 6.5–8.1)

## 2023-11-24 LAB — RETICULOCYTES
Immature Retic Fract: 29 % — ABNORMAL HIGH (ref 2.3–15.9)
RBC.: 6.76 MIL/uL — ABNORMAL HIGH (ref 3.87–5.11)
Retic Count, Absolute: 150.7 10*3/uL (ref 19.0–186.0)
Retic Ct Pct: 2.2 % (ref 0.4–3.1)

## 2023-11-24 LAB — IRON AND IRON BINDING CAPACITY (CC-WL,HP ONLY)
Iron: 21 ug/dL — ABNORMAL LOW (ref 28–170)
Saturation Ratios: 4 % — ABNORMAL LOW (ref 10.4–31.8)
TIBC: 545 ug/dL — ABNORMAL HIGH (ref 250–450)
UIBC: 524 ug/dL — ABNORMAL HIGH (ref 148–442)

## 2023-11-24 LAB — FERRITIN: Ferritin: 9 ng/mL — ABNORMAL LOW (ref 11–307)

## 2023-11-24 MED ORDER — RUXOLITINIB PHOSPHATE 20 MG PO TABS: 20.0000 mg | ORAL_TABLET | Freq: Two times a day (BID) | ORAL | 6 refills | Status: DC

## 2023-11-24 NOTE — Progress Notes (Signed)
 Hematology and Oncology Follow Up Visit  EVERETT RICCIARDELLI [id number] 12/21/1955 68 y.o. 11/24/2023   Principle Diagnosis:  Polycythemia vera, JAK 2 positive    Current Therapy:        Phlebotomy as indicated to maintain Hct < 45%    Aspirin 81 mg p.o. daily    Jakifi 20 mg po BID -- start on 11/28/2023      Interim History:  Ms. Giammona is here today for follow-up for her polycythemia vera.  Unfortunately, I think that we are going to have to probably get her on Jakafi at this point.  Her splenic ultrasound showed that her spleen increased from 670 cm up to 930 cm.  In addition, her platelet count also went up quite a lot.  I think that Jakafi would be a reasonable option for her.  I think she would tolerate this well.  I think that it will help with her platelet count and help with splenomegaly.  Overall, she feels okay.  She has had no complaints.  She has had no cough or shortness of breath.  She has had no abdominal pain.  There has been no change in bowel or bladder habits.  She has had no bleeding.  She has had no rashes.  There has been no leg swelling.  She has had no headache.  She is still doing a lot of quilting.  She really enjoys doing this.  Currently, I would have to say that her performance status is ECOG 1.    Wt Readings from Last 3 Encounters:  11/24/23 220 lb (99.8 kg)  09/15/23 226 lb (102.5 kg)  09/02/23 225 lb (102.1 kg)      Medications:  Allergies as of 11/24/2023       Reactions   Percocet [oxycodone-acetaminophen] Other (See Comments)   SYNCOPE TACHYCARDIA   Shrimp [shellfish Allergy] Anaphylaxis, Itching, Nausea And Vomiting, Swelling   Hydrocodone-acetaminophen Nausea And Vomiting, Other (See Comments)   Dizziness, also   Lipitor [atorvastatin] Other (See Comments)   Ear pain/fullness in ear "as if I were under water"   Saxenda [liraglutide -weight Management] Hives, Itching, Other (See Comments)   Hives/itching/site reaction   Ambien  [zolpidem]    Sleep walking   Adhesive [tape] Rash, Other (See Comments)   EKG leads and certain bandages break out the skin   Belviq Xr [lorcaserin Hcl Er] Other (See Comments)   Gained weight    Ferrous Sulfate Nausea Only   Morphine And Codeine Nausea And Vomiting        Medication List        Accurate as of November 24, 2023 12:34 PM. If you have any questions, ask your nurse or doctor.          acetaminophen 325 MG tablet Commonly known as: TYLENOL Take 325-650 mg by mouth every 6 (six) hours as needed for mild pain or headache.   albuterol 108 (90 Base) MCG/ACT inhaler Commonly known as: VENTOLIN HFA Inhale 2 puffs into the lungs every 6 (six) hours as needed for wheezing or shortness of breath.   Aspirin 81 MG Caps Take 81 mg by mouth daily at 6 (six) AM.   B-12 PO Take 1 tablet by mouth daily.   calcium carbonate 500 MG chewable tablet Commonly known as: TUMS - dosed in mg elemental calcium Chew 1 tablet by mouth as needed for indigestion or heartburn.   cetirizine 5 MG tablet Commonly known as: ZYRTEC Take 10 mg by mouth daily.  cholecalciferol 25 MCG (1000 UNIT) tablet Commonly known as: VITAMIN D3 Take 1,000 Units by mouth daily.   citalopram 20 MG tablet Commonly known as: CELEXA TAKE 1 TABLET DAILY   levothyroxine 125 MCG tablet Commonly known as: SYNTHROID TAKE 1 TABLET BY MOUTH EVERY DAY BEFORE BREAKFAST   lisinopril 5 MG tablet Commonly known as: ZESTRIL TAKE 1 TABLET (5 MG TOTAL) BY MOUTH DAILY.   LUBRICATING EYE DROPS OP Place 1 drop into both eyes as needed (dry eyes).   methocarbamol 500 MG tablet Commonly known as: ROBAXIN TAKE 1 TABLET BY MOUTH THREE TIMES A DAY   metoprolol succinate 25 MG 24 hr tablet Commonly known as: TOPROL-XL TAKE 1 TABLET (25 MG TOTAL) BY MOUTH DAILY.   pantoprazole 40 MG tablet Commonly known as: PROTONIX TAKE 1 TABLET BY MOUTH EVERY DAY   promethazine 25 MG tablet Commonly known as:  PHENERGAN TAKE 1 TABLET (25 MG TOTAL) BY MOUTH AS NEEDED.   rosuvastatin 10 MG tablet Commonly known as: CRESTOR TAKE 1 TABLET BY MOUTH EVERY DAY   traMADol 50 MG tablet Commonly known as: ULTRAM Take 50 mg by mouth every 6 (six) hours as needed.   traZODone 50 MG tablet Commonly known as: DESYREL TAKE 0.5-1 TABLETS BY MOUTH AT BEDTIME AS NEEDED FOR SLEEP. What changed: See the new instructions.        Allergies:  Allergies  Allergen Reactions   Percocet [Oxycodone-Acetaminophen] Other (See Comments)    SYNCOPE TACHYCARDIA   Shrimp [Shellfish Allergy] Anaphylaxis, Itching, Nausea And Vomiting and Swelling   Hydrocodone-Acetaminophen Nausea And Vomiting and Other (See Comments)    Dizziness, also   Lipitor [Atorvastatin] Other (See Comments)    Ear pain/fullness in ear "as if I were under water"   Saxenda [Liraglutide -Weight Management] Hives, Itching and Other (See Comments)    Hives/itching/site reaction   Ambien [Zolpidem]     Sleep walking   Adhesive [Tape] Rash and Other (See Comments)    EKG leads and certain bandages break out the skin   Belviq Xr [Lorcaserin Hcl Er] Other (See Comments)    Gained weight    Ferrous Sulfate Nausea Only   Morphine And Codeine Nausea And Vomiting    Past Medical History, Surgical history, Social history, and Family History were reviewed and updated.  Review of Systems:  Review of Systems  Constitutional: Negative.   HENT: Negative.    Eyes: Negative.   Respiratory: Negative.    Cardiovascular: Negative.   Gastrointestinal: Negative.   Genitourinary: Negative.   Musculoskeletal: Negative.   Skin: Negative.   Neurological: Negative.   Endo/Heme/Allergies: Negative.   Psychiatric/Behavioral: Negative.       Physical Exam:  weight is 220 lb (99.8 kg). Her oral temperature is 98 F (36.7 C). Her blood pressure is 127/53 (abnormal) and her pulse is 56 (abnormal). Her respiration is 18 and oxygen saturation is 99%.   Wt  Readings from Last 3 Encounters:  11/24/23 220 lb (99.8 kg)  09/15/23 226 lb (102.5 kg)  09/02/23 225 lb (102.1 kg)   Physical Exam Vitals reviewed.  HENT:     Head: Normocephalic and atraumatic.  Eyes:     Pupils: Pupils are equal, round, and reactive to light.  Cardiovascular:     Rate and Rhythm: Normal rate and regular rhythm.     Heart sounds: Normal heart sounds.  Pulmonary:     Effort: Pulmonary effort is normal.     Breath sounds: Normal breath sounds.  Abdominal:  General: Bowel sounds are normal.     Palpations: Abdomen is soft.  Musculoskeletal:        General: No tenderness or deformity. Normal range of motion.     Cervical back: Normal range of motion.  Lymphadenopathy:     Cervical: No cervical adenopathy.  Skin:    General: Skin is warm and dry.     Findings: No erythema or rash.  Neurological:     Mental Status: She is alert and oriented to person, place, and time.  Psychiatric:        Behavior: Behavior normal.        Thought Content: Thought content normal.        Judgment: Judgment normal.    Lab Results  Component Value Date   WBC 13.3 (H) 11/24/2023   HGB 12.0 11/24/2023   HCT 42.7 11/24/2023   MCV 63.2 (L) 11/24/2023   PLT 670 (H) 11/24/2023   Lab Results  Component Value Date   FERRITIN 10 (L) 09/15/2023   IRON 21 (L) 09/15/2023   TIBC 515 (H) 09/15/2023   UIBC 494 (H) 09/15/2023   IRONPCTSAT 4 (L) 09/15/2023   Lab Results  Component Value Date   RETICCTPCT 2.2 11/24/2023   RBC 6.76 (H) 11/24/2023   No results found for: "KPAFRELGTCHN", "LAMBDASER", "KAPLAMBRATIO" No results found for: "IGGSERUM", "IGA", "IGMSERUM" No results found for: "TOTALPROTELP", "ALBUMINELP", "A1GS", "A2GS", "BETS", "BETA2SER", "GAMS", "MSPIKE", "SPEI"   Chemistry      Component Value Date/Time   NA 142 11/24/2023 1134   K 4.3 11/24/2023 1134   CL 107 11/24/2023 1134   CO2 27 11/24/2023 1134   BUN 22 11/24/2023 1134   CREATININE 1.21 (H) 11/24/2023  1134   CREATININE 1.18 (H) 12/11/2021 1221      Component Value Date/Time   CALCIUM 10.2 11/24/2023 1134   ALKPHOS 89 11/24/2023 1134   AST 23 11/24/2023 1134   ALT 21 11/24/2023 1134   BILITOT 0.7 11/24/2023 1134     Impression and Plan: Ms. Joo is a very pleasant 68 yo caucasian female with polycythemia.  She is JAK2 positive.    We will go ahead and get her on Jakafi at this point.  I will start her off on 20 mg p.o. twice daily.  I think this to be a good dose for her.  I talked to she and her husband about this.  I explained why I thought that Jakafi would be reasonable at this point.  I just am not happy with his spleen increasing in size.  The platelets do not bother me as much.  It is the fact that her spleen is increasing which I think is significant.  I would like to think that she will start the Jakafi in about a week.  I would like to see her back in about 3 weeks or so just to make sure that she is doing okay.     Ivor Mars, MD 4/16/202512:34 PM

## 2023-11-24 NOTE — Telephone Encounter (Signed)
 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  CBC w/ Diff and CMP from 11/24/23 assessed, noted patient Scr of 1.21 mg/dL (CrCl ~82.9 mL/min) - recommended starting dose for renal dysfunction is 5 mg PO BID - staff message sent to Dr. Maria Shiner regarding recommendation for dose adjustment.  Current medication list in Epic reviewed, no relevant/significant DDIs with Jakafi identified.  Evaluated chart and no patient barriers to medication adherence noted.   Patient is required to fill through [facility].   Oral Oncology Clinic will continue to follow for insurance authorization, copayment issues, initial counseling and start date.  Jude Norton, PharmD, BCPS, BCOP Hematology/Oncology Clinical Pharmacist [facility] and [facility] Oral Chemotherapy Navigation Clinics [phone] 11/24/2023 2:46 PM

## 2023-11-24 NOTE — Telephone Encounter (Addendum)
 Oral Oncology Patient Advocate Encounter  After completing a benefits investigation, prior authorization for Jakafi is not required at this time through [employer].  Patient must fill at [facility].  Patient information and supporting documents have been e-faxed to [facility].  Patty Benjaman Branch, CPhT Oncology Pharmacy Patient Advocate [facility] Direct Number: [phone] Fax: [phone]

## 2023-11-25 MED ORDER — RUXOLITINIB PHOSPHATE 5 MG PO TABS: 5.0000 mg | ORAL_TABLET | Freq: Two times a day (BID) | ORAL | 6 refills | Status: AC

## 2023-11-29 ENCOUNTER — Telehealth: Payer: Self-pay | Admitting: Pharmacy Technician

## 2023-11-29 ENCOUNTER — Other Ambulatory Visit: Payer: Self-pay | Admitting: *Deleted

## 2023-11-29 DIAGNOSIS — D45 Polycythemia vera: Secondary | ICD-10-CM

## 2023-11-29 MED ORDER — RUXOLITINIB PHOSPHATE 10 MG PO TABS: 10.0000 mg | ORAL_TABLET | Freq: Two times a day (BID) | ORAL | 3 refills | Status: DC

## 2023-11-29 NOTE — Telephone Encounter (Signed)
 Oral Oncology Patient Advocate Encounter  Attempted to contact patient to let her know to call [facility] to set up shipment of her medication.  No answer. LVM to call [facility] and to call me back if she has any questions.  Patty Benjaman Branch, CPhT Oncology Pharmacy Patient Advocate [facility] Direct Number: [phone] Fax: [phone]

## 2023-11-30 ENCOUNTER — Other Ambulatory Visit: Payer: Self-pay | Admitting: Pharmacist

## 2023-11-30 NOTE — Telephone Encounter (Signed)
 Oral Chemotherapy Pharmacist Encounter   Attempted to reach patient to provide update and offer for initial counseling on oral medication: Jakafi  (ruxolitinib ).   No answer. Left voicemail for patient to call back to discuss details of medication and initial counseling session. Per [facility], Jakafi  is scheduled to deliver to patient's home on 11/30/23.  Jude Norton, PharmD, BCPS, BCOP Hematology/Oncology Clinical Pharmacist [facility] and [facility] Oral Chemotherapy Navigation Clinics [phone] 11/30/2023 10:11 AM

## 2023-11-30 NOTE — Progress Notes (Signed)
 Oral Chemotherapy Pharmacist Encounter   Previously confirmed with Dr. Maria Shiner OK for patient to be on Jakafi  5 mg PO BID due to renal dose adjustments for CrCl of 51.1 mL/min.   Jakafi  prescription of 10 mg PO BID discontinued at [facility] as patient will be on dose reduction as mentioned above.   Jude Norton, PharmD, BCPS, BCOP Hematology/Oncology Clinical Pharmacist [facility] and [facility] Oral Chemotherapy Navigation Clinics [phone] 11/30/2023 10:45 AM

## 2023-11-30 NOTE — Telephone Encounter (Signed)
 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   Counseled patient on administration, dosing, side effects, monitoring, drug-food interactions, safe handling, storage, and disposal.  Patient will take Jakafi  5mg  tablets, 1 tablet by mouth 2 times daily, with or without food.  Patient knows to avoid grapefruit and grapefruit juice.  Jakafi  start date: 11/30/23 PM dose  Adverse effects include but are not limited to: decreased blood counts, increased lipid profile, increased cholesterol, increased liver enzymes, dizziness, headache, diarrhea  Reviewed with patient importance of keeping a medication schedule and plan for any missed doses. No barriers to medication adherence identified.  Medication reconciliation performed and medication/allergy list updated.  All questions answered.  Ms. Elem voiced understanding and appreciation.   Medication education handout placed in mail for patient. Patient knows to call the office with questions or concerns. Oral Chemotherapy Clinic phone number provided to patient.   Jude Norton, PharmD, BCPS, BCOP Hematology/Oncology Clinical Pharmacist [facility] and [facility] Oral Chemotherapy Navigation Clinics [phone] 11/30/2023 10:41 AM

## 2023-12-02 ENCOUNTER — Ambulatory Visit

## 2023-12-02 VITALS — Ht 64.5 in | Wt 217.0 lb

## 2023-12-02 DIAGNOSIS — Z0000 Encounter for general adult medical examination without abnormal findings: Secondary | ICD-10-CM | POA: Diagnosis not present

## 2023-12-02 NOTE — Progress Notes (Signed)
 Subjective:   [name] is a 68 y.o. female who presents for Medicare Annual (Subsequent) preventive examination.  Visit Complete: Virtual I connected with  Charmel Cooter on 12/02/23 by a audio enabled telemedicine application and verified that I am speaking with the correct person using two identifiers.  Patient Location: Home  Provider Location: Office/Clinic  I discussed the limitations of evaluation and management by telemedicine. The patient expressed understanding and agreed to proceed.  Vital Signs: Because this visit was a virtual/telehealth visit, some criteria may be missing or patient reported. Any vitals not documented were not able to be obtained and vitals that have been documented are patient reported.  Patient Medicare AWV questionnaire was completed by the patient on 12/01/2023; I have confirmed that all information answered by patient is correct and no changes since this date.  Cardiac Risk Factors include: advanced age (>79men, >76 women);obesity (BMI >30kg/m2);smoking/ tobacco exposure;family history of premature cardiovascular disease;hypertension;dyslipidemia     Objective:    Today's Vitals   12/02/23 0954 12/02/23 0958  Weight: 217 lb (98.4 kg)   Height: 5' 4.5" (1.638 m)   PainSc:  2    Body mass index is 36.67 kg/m.     12/02/2023   10:08 AM 11/24/2023   11:45 AM 09/15/2023   12:48 PM 07/20/2023   12:18 PM 06/21/2023   10:46 AM 05/10/2023   10:16 AM 03/08/2023    9:02 AM  Advanced Directives  Does Patient Have a Medical Advance Directive? Yes Yes Yes Yes Yes Yes Yes  Type of Estate agent of [place];Living will Living will;Healthcare Power of Attorney Living will;Healthcare Power of Attorney Living will;Healthcare Power of Attorney Living will;Healthcare Power of Attorney Living will Living will;Healthcare Power of Attorney  Does patient want to make changes to medical advance directive?    No - Patient declined No -  Patient declined No - Patient declined No - Patient declined  Copy of Healthcare Power of Attorney in Chart? Yes - validated most recent copy scanned in chart (See row information) Yes - validated most recent copy scanned in chart (See row information)       Would patient like information on creating a medical advance directive? No - Patient declined No - Patient declined         Current Medications (verified) Outpatient Encounter Medications as of 12/02/2023  Medication Sig   acetaminophen  (TYLENOL ) 325 MG tablet Take 325-650 mg by mouth every 6 (six) hours as needed for mild pain or headache.   albuterol  (VENTOLIN  HFA) 108 (90 Base) MCG/ACT inhaler Inhale 2 puffs into the lungs every 6 (six) hours as needed for wheezing or shortness of breath.   Aspirin  81 MG CAPS Take 81 mg by mouth daily at 6 (six) AM.   calcium  carbonate (TUMS - DOSED IN MG ELEMENTAL CALCIUM ) 500 MG chewable tablet Chew 1 tablet by mouth as needed for indigestion or heartburn.   Carboxymethylcellul-Glycerin  (LUBRICATING EYE DROPS OP) Place 1 drop into both eyes as needed (dry eyes).    cetirizine  (ZYRTEC ) 5 MG tablet Take 10 mg by mouth daily.   cholecalciferol  (VITAMIN D3) 25 MCG (1000 UNIT) tablet Take 1,000 Units by mouth daily.   citalopram  (CELEXA ) 20 MG tablet TAKE 1 TABLET DAILY   Cyanocobalamin (B-12 PO) Take 1 tablet by mouth daily.    levothyroxine  (SYNTHROID ) 125 MCG tablet TAKE 1 TABLET BY MOUTH EVERY DAY BEFORE BREAKFAST   lisinopril  (ZESTRIL ) 5 MG tablet TAKE 1 TABLET (5  MG TOTAL) BY MOUTH DAILY.   methocarbamol  (ROBAXIN ) 500 MG tablet TAKE 1 TABLET BY MOUTH THREE TIMES A DAY   metoprolol  succinate (TOPROL -XL) 25 MG 24 hr tablet TAKE 1 TABLET (25 MG TOTAL) BY MOUTH DAILY.   pantoprazole  (PROTONIX ) 40 MG tablet TAKE 1 TABLET BY MOUTH EVERY DAY   promethazine  (PHENERGAN ) 25 MG tablet TAKE 1 TABLET (25 MG TOTAL) BY MOUTH AS NEEDED.   rosuvastatin  (CRESTOR ) 10 MG tablet TAKE 1 TABLET BY MOUTH EVERY DAY    ruxolitinib  phosphate (JAKAFI ) 5 MG tablet Take 1 tablet (5 mg total) by mouth 2 (two) times daily.   traZODone  (DESYREL ) 50 MG tablet TAKE 0.5-1 TABLETS BY MOUTH AT BEDTIME AS NEEDED FOR SLEEP. (Patient taking differently: Take 50 mg by mouth at bedtime as needed.)   traMADol  (ULTRAM ) 50 MG tablet Take 50 mg by mouth every 6 (six) hours as needed. (Patient not taking: Reported on 12/02/2023)   No facility-administered encounter medications on file as of 12/02/2023.    Allergies (verified) Percocet [oxycodone -acetaminophen ], Shrimp [shellfish allergy], Hydrocodone -acetaminophen , Lipitor Amal.August ], Saxenda  [liraglutide  -weight management], Ambien  [zolpidem ], Adhesive [tape], Belviq xr [lorcaserin  hcl er], Ferrous sulfate , and Morphine  and codeine   History: Past Medical History:  Diagnosis Date   Acute medial meniscal tear    Right knee   Allergy    Anxiety    Aortic insufficiency    Mild   Aortic regurgitation    Arthritis    Asthma    Cancer [facility]) May 2023   Polycythemia Vera   Carpal tunnel syndrome    Cataract 2022   Chronic kidney disease    one kidney    Dysrhythmia    pvc's at times   GERD (gastroesophageal reflux disease)    Heart murmur 1985   MVP   Hyperlipidemia    Hypertension 2015   Hypothyroidism    Migraine headache    Mitral regurgitation    Mitral valve prolapse    Nausea alone    Need for SBE (subacute bacterial endocarditis) prophylaxis    Pilonidal cyst without mention of abscess    Pneumonia 08/14/2012   Polycythemia vera (HCC) 03/08/2023   PONV (postoperative nausea and vomiting)    states she vasovagals   Sleep apnea    wears CPAP   Thyroid  disease    Transplanted kidney removed    donated kidney   Past Surgical History:  Procedure Laterality Date   ABDOMINAL HYSTERECTOMY     ANTERIOR CERVICAL DECOMP/DISCECTOMY FUSION  05/28/2020   CARPAL TUNNEL RELEASE Right    CARPAL TUNNEL RELEASE Left 02/25/2017   Procedure: LEFT OPEN CARPAL  TUNNEL RELEASE;  Surgeon: Arnie Lao, MD;  Location: [facility] OR;  Service: Orthopedics;  Laterality: Left;   CARPAL TUNNEL RELEASE Left 1998   CARPAL TUNNEL RELEASE Right 2019   COLONOSCOPY     COLONOSCOPY WITH PROPOFOL  N/A 09/02/2023   Procedure: COLONOSCOPY WITH PROPOFOL ;  Surgeon: Kenney Peacemaker, MD;  Location: [facility];  Service: Gastroenterology;  Laterality: N/A;   JOINT REPLACEMENT  2015   RTKA   KNEE ARTHROSCOPY Right 01/06/2018   Procedure: RIGHT KNEE ARTHROSCOPY WITH PARTIAL  MEDIAL MENISCECTOMY;  Surgeon: Arnie Lao, MD;  Location: [facility] OR;  Service: Orthopedics;  Laterality: Right;   KNEE SURGERY     LARYNGOSCOPY N/A 05/30/2020   Procedure: LARYNGOSCOPY;  Surgeon: Reynold Caves, MD;  Location: [facility] OR;  Service: ENT;  Laterality: N/A;   NEPHRECTOMY     PARTIAL KNEE ARTHROPLASTY Right 03/31/2018  Procedure: RIGHT KNEE UNI ARTHROPLASTY;  Surgeon: Osa Blase, MD;  Location: [facility];  Service: Orthopedics;  Laterality: Right;   SPINE SURGERY  Oct 2021   ACDF   TONSILLECTOMY     TOTAL KNEE ARTHROPLASTY Left 09/14/2013   Procedure: LEFT TOTAL KNEE ARTHROPLASTY;  Surgeon: Arnie Lao, MD;  [facility] OR;  Service: Orthopedics;  Laterality: Left;   TUBAL LIGATION     WISDOM TOOTH EXTRACTION     WOUND EXPLORATION N/A 05/28/2020   Procedure: ANTERIOR CERVICAL WOUND EXPLORATION;  Surgeon: Manya Sells, MD;  Location[facility];  Service: Neurosurgery;  Laterality: N/A;   Family History  Problem Relation Age of Onset   Diabetes Mother    Hypertension Mother    Heart disease Father    Hypertension Father    Thyroid  disease Father    AAA (abdominal aortic aneurysm) Father    Arthritis Father    Diabetes Sister    Fibromyalgia Sister    Obstructive Sleep Apnea Sister    Arthritis Sister    Asthma Sister    Hypertension Sister    Miscarriages / Stillbirths Sister    Obesity Sister    Cancer Sister    Breast cancer Sister 74    Cancer Maternal Aunt    Cancer Maternal Aunt    Stroke Paternal Grandmother    Kidney disease Daughter    Hearing loss Daughter    Obesity Daughter    Colon polyps Daughter    Heart disease Son    Breast cancer Cousin        maternal first cousin   Colon cancer Neg Hx    Esophageal cancer Neg Hx    Rectal cancer Neg Hx    Stomach cancer Neg Hx    Social History   Socioeconomic History   Marital status: Married    Spouse name: Tom   Number of children: 7   Years of education: 18   Highest education level: Manufacturing engineer (e.g., MA, MS, MEng, MEd, MSW, MBA)  Occupational History    Employer: [employer]   Occupation: Retired  Tobacco Use   Smoking status: Former    Current packs/day: 0.00    Average packs/day: 0.3 packs/day for 5.0 years (1.3 ttl pk-yrs)    Types: Cigarettes    Start date: 11/22/1978    Quit date: 11/22/1983    Years since quitting: 40.0   Smokeless tobacco: Never   Tobacco comments:    Did not smoke daily  Vaping Use   Vaping status: Never Used  Substance and Sexual Activity   Alcohol  use: Yes    Comment: Rare maybe once a month   Drug use: No   Sexual activity: Yes    Partners: Male    Birth control/protection: Post-menopausal  Other Topics Concern   Not on file  Social History Narrative   Lives with spouse. She has 7 children. She enjoys quilting and reading.   Social Drivers of Corporate investment banker Strain: Low Risk  (12/02/2023)   Overall Financial Resource Strain (CARDIA)    Difficulty of Paying Living Expenses: Not hard at all  Food Insecurity: No Food Insecurity (12/02/2023)   Hunger Vital Sign    Worried About Running Out of Food in the Last Year: Never true    Ran Out of Food in the Last Year: Never true  Transportation Needs: No Transportation Needs (12/02/2023)   PRAPARE - Administrator, Civil Service (Medical): No  Lack of Transportation (Non-Medical): No  Physical Activity: Insufficiently Active (12/02/2023)    Exercise Vital Sign    Days of Exercise per Week: 2 days    Minutes of Exercise per Session: 10 min  Stress: No Stress Concern Present (12/02/2023)   [employer] of Occupational Health - Occupational Stress Questionnaire    Feeling of Stress : Not at all  Social Connections: Moderately Integrated (12/02/2023)   Social Connection and Isolation Panel [NHANES]    Frequency of Communication with Friends and Family: More than three times a week    Frequency of Social Gatherings with Friends and Family: Three times a week    Attends Religious Services: More than 4 times per year    Active Member of Clubs or Organizations: No    Attends Banker Meetings: Never    Marital Status: Married    Tobacco Counseling Counseling given: Not Answered Tobacco comments: Did not smoke daily   Clinical Intake:  Pre-visit preparation completed: Yes  Pain : 0-10 Pain Score: 2  Pain Type: Chronic pain Pain Location: Shoulder Pain Orientation: Right Pain Descriptors / Indicators: Aching Pain Onset: More than a month ago Pain Frequency: Intermittent     BMI - recorded: 36.67 Nutritional Status: BMI > 30  Obese Nutritional Risks: None Diabetes: No  How often do you need to have someone help you when you read instructions, pamphlets, or other written materials from your doctor or pharmacy?: 1 - Never What is the last grade level you completed in school?: 16  Interpreter Needed?: No      Activities of Daily Living    12/02/2023   10:00 AM 12/01/2023   11:00 AM  In your present state of health, do you have any difficulty performing the following activities:  Hearing? 0 0  Vision? 0 0  Difficulty concentrating or making decisions? 0 0  Walking or climbing stairs? 0 0  Dressing or bathing? 0 0  Doing errands, shopping? 0 0  Preparing Food and eating ? N N  Using the Toilet? N N  In the past six months, have you accidently leaked urine? N N  Do you have problems with loss  of bowel control? N N  Managing your Medications? N N  Managing your Finances? N N  Housekeeping or managing your Housekeeping? N N    Patient Care Team: Breeback, Jade L, PA-C as PCP - General (Family Medicine) Hugh Madura, MD as PCP - Cardiology (Cardiology) Dorthey Gave, PA-C as Physician Assistant (Dermatology)  Indicate any recent Medical Services you may have received from other than [facility] providers in the past year (date may be approximate).     Assessment:   This is a routine wellness examination for [name].  Hearing/Vision screen No results found.   Goals Addressed             This Visit's Progress    Patient Stated       Patient stated she would like to walk more often.       Depression Screen    12/02/2023   10:07 AM 08/30/2023    9:24 AM 12/04/2022   12:29 PM 11/24/2022    9:40 AM 08/26/2022   11:18 AM 05/06/2022    2:09 PM 11/13/2021    1:11 PM  PHQ 2/9 Scores  PHQ - 2 Score 0 0 0 0 1 0 0  PHQ- 9 Score   3        Fall Risk  12/02/2023   10:08 AM 12/01/2023   11:00 AM 08/30/2023    9:24 AM 05/11/2023   10:33 AM 12/04/2022   10:38 AM  Fall Risk   Falls in the past year? 1 1  1 1   Number falls in past yr: 1 1 1  0 0  Injury with Fall? 1 0 1  0  Risk for fall due to : Impaired balance/gait   Other (Comment)   Follow up Falls evaluation completed    Falls evaluation completed    MEDICARE RISK AT HOME: Medicare Risk at Home Any stairs in or around the home?: Yes If so, are there any without handrails?: No Home free of loose throw rugs in walkways, pet beds, electrical cords, etc?: Yes Adequate lighting in your home to reduce risk of falls?: Yes Life alert?: No Use of a cane, walker or w/c?: No Grab bars in the bathroom?: No Shower chair or bench in shower?: No Elevated toilet seat or a handicapped toilet?: Yes  TIMED UP AND GO:  Was the test performed?  No    Cognitive Function:        12/02/2023   10:10 AM 11/24/2022    9:46 AM  03/04/2021    1:31 PM  6CIT Screen  What Year? 0 points 0 points 0 points  What month? 0 points 0 points 0 points  What time? 0 points 0 points 0 points  Count back from 20 0 points 0 points 0 points  Months in reverse 0 points 0 points 0 points  Repeat phrase 0 points 0 points 0 points  Total Score 0 points 0 points 0 points    Immunizations Immunization History  Administered Date(s) Administered   Fluad Quad(high Dose 65+) 06/05/2022   Influenza Whole 08/14/1999   Influenza, Seasonal, Injecte, Preservative Fre 05/11/2023   Influenza-Unspecified 04/10/2016, 05/16/2018   PFIZER(Purple Top)SARS-COV-2 Vaccination 08/02/2019, 08/23/2019, 04/29/2020   Pneumococcal Conjugate-13 01/25/2014   Pneumococcal Polysaccharide-23 04/11/2020   Tdap 08/11/2003, 09/28/2011   Zoster Recombinant(Shingrix ) 06/16/2017, 12/13/2017   Zoster, Live 05/24/2012    TDAP status: Due, Education has been provided regarding the importance of this vaccine. Advised may receive this vaccine at local pharmacy or Health Dept. Aware to provide a copy of the vaccination record if obtained from local pharmacy or Health Dept. Verbalized acceptance and understanding.  Flu Vaccine status: Up to date  Pneumococcal vaccine status: Up to date  Covid-19 vaccine status: Information provided on how to obtain vaccines.   Qualifies for Shingles Vaccine? Yes   Zostavax completed Yes   Shingrix  Completed?: Yes  Screening Tests Health Maintenance  Topic Date Due   COVID-19 Vaccine (4 - 2024-25 season) 04/11/2023   INFLUENZA VACCINE  03/10/2024   Medicare Annual Wellness (AWV)  12/01/2024   MAMMOGRAM  06/21/2025   Colonoscopy  09/01/2033   Pneumonia Vaccine 28+ Years old  Completed   DEXA SCAN  Completed   Hepatitis C Screening  Completed   Zoster Vaccines- Shingrix   Completed   HPV VACCINES  Aged Out   Meningococcal B Vaccine  Aged Out   DTaP/Tdap/Td  Discontinued    Health Maintenance  Health Maintenance Due   Topic Date Due   COVID-19 Vaccine (4 - 2024-25 season) 04/11/2023    Colorectal cancer screening: Type of screening: Colonoscopy. Completed 09/02/2023. Repeat every 10 years  Mammogram status: Completed 06/22/2023. Repeat every year  Bone Density status: Completed 12/16/2022. Results reflect: Bone density results: NORMAL. Repeat every 2 years.  Lung Cancer Screening: (Low  Dose CT Chest recommended if Age 26-80 years, 20 pack-year currently smoking OR have quit w/in 15years.) does not qualify.   Lung Cancer Screening Referral: n/a  Additional Screening:  Hepatitis C Screening: does qualify; Completed 07/12/2017  Vision Screening: Recommended annual ophthalmology exams for early detection of glaucoma and other disorders of the eye. Is the patient up to date with their annual eye exam?  Yes  Who is the provider or what is the name of the office in which the patient attends annual eye exams? Dr Daneil Dunker If pt is not established with a provider, would they like to be referred to a provider to establish care?  N/a .   Dental Screening: Recommended annual dental exams for proper oral hygiene   Community Resource Referral / Chronic Care Management: CRR required this visit?  No   CCM required this visit?  No     Plan:     I have personally reviewed and noted the following in the patient's chart:   Medical and social history Use of alcohol , tobacco or illicit drugs  Current medications and supplements including opioid prescriptions. Patient is not currently taking opioid prescriptions. Functional ability and status Nutritional status Physical activity Advanced directives List of other physicians Hospitalizations, surgeries, and ER #1visits in previous 12 months Vitals Screenings to include cognitive, depression, and falls Referrals and appointments  In addition, I have reviewed and discussed with patient certain preventive protocols, quality metrics, and best practice  recommendations. A written personalized care plan for preventive services as well as general preventive health recommendations were provided to patient.     Aubrey Leaf, CMA   12/02/2023   After Visit Summary: (MyChart) Due to this being a telephonic visit, the after visit summary with patients personalized plan was offered to patient via MyChart   Nurse Notes:    [name] is a 68 y.o. female patient of Araceli Knight, PA-C who had a Medicare Annual Wellness Visit today via telephone. [name] is Retired and lives with their spouse. She has 7 children. She reports that she is socially active and does interact with friends/family regularly. She is minimally physically active and enjoys quilting and reading.

## 2023-12-02 NOTE — Patient Instructions (Signed)
  Ms. Bump , Thank you for taking time to come for your Medicare Wellness Visit. I appreciate your ongoing commitment to your health goals. Please review the following plan we discussed and let me know if I can assist you in the future.   These are the goals we discussed:  Goals       Patient Stated (pt-stated)      03/04/2021 AWV Goal: Exercise for General Health  Patient will verbalize understanding of the benefits of increased physical activity: Exercising regularly is important. It will improve your overall fitness, flexibility, and endurance. Regular exercise also will improve your overall health. It can help you control your weight, reduce stress, and improve your bone density. Over the next year, patient will increase physical activity as tolerated with a goal of at least 150 minutes of moderate physical activity per week.  You can tell that you are exercising at a moderate intensity if your heart starts beating faster and you start breathing faster but can still hold a conversation. Moderate-intensity exercise ideas include: Walking 1 mile (1.6 km) in about 15 minutes Biking Hiking Golfing Dancing Water  aerobics Patient will verbalize understanding of everyday activities that increase physical activity by providing examples like the following: Yard work, such as: Insurance underwriter Gardening Washing windows or floors Patient will be able to explain general safety guidelines for exercising:  Before you start a new exercise program, talk with your health care provider. Do not exercise so much that you hurt yourself, feel dizzy, or get very short of breath. Wear comfortable clothes and wear shoes with good support. Drink plenty of water  while you exercise to prevent dehydration or heat stroke. Work out until your breathing and your heartbeat get faster.       Patient Stated (pt-stated)       Patient stated that she would like to start walking daily.      Patient Stated      Patient stated she would like to walk more often.         This is a list of the screening recommended for you and due dates:  Health Maintenance  Topic Date Due   COVID-19 Vaccine (4 - 2024-25 season) 04/11/2023   Flu Shot  03/10/2024   Medicare Annual Wellness Visit  12/01/2024   Mammogram  06/21/2025   Colon Cancer Screening  09/01/2033   Pneumonia Vaccine  Completed   DEXA scan (bone density measurement)  Completed   Hepatitis C Screening  Completed   Zoster (Shingles) Vaccine  Completed   HPV Vaccine  Aged Out   Meningitis B Vaccine  Aged Out   DTaP/Tdap/Td vaccine  Discontinued

## 2023-12-05 ENCOUNTER — Other Ambulatory Visit: Payer: Self-pay | Admitting: Physician Assistant

## 2023-12-05 DIAGNOSIS — E782 Mixed hyperlipidemia: Secondary | ICD-10-CM

## 2023-12-05 DIAGNOSIS — E781 Pure hyperglyceridemia: Secondary | ICD-10-CM

## 2023-12-06 ENCOUNTER — Other Ambulatory Visit: Payer: Self-pay | Admitting: Physician Assistant

## 2023-12-06 DIAGNOSIS — I493 Ventricular premature depolarization: Secondary | ICD-10-CM

## 2023-12-17 ENCOUNTER — Inpatient Hospital Stay

## 2023-12-17 ENCOUNTER — Inpatient Hospital Stay: Admitting: Hematology & Oncology

## 2023-12-24 ENCOUNTER — Inpatient Hospital Stay: Attending: Hematology & Oncology

## 2023-12-24 ENCOUNTER — Inpatient Hospital Stay (HOSPITAL_BASED_OUTPATIENT_CLINIC_OR_DEPARTMENT_OTHER): Admitting: Hematology & Oncology

## 2023-12-24 ENCOUNTER — Encounter: Payer: Self-pay | Admitting: Hematology & Oncology

## 2023-12-24 VITALS — BP 142/53 | HR 47 | Temp 97.8°F | Resp 18 | Wt 222.4 lb

## 2023-12-24 DIAGNOSIS — Z79899 Other long term (current) drug therapy: Secondary | ICD-10-CM | POA: Insufficient documentation

## 2023-12-24 DIAGNOSIS — Z7982 Long term (current) use of aspirin: Secondary | ICD-10-CM | POA: Diagnosis not present

## 2023-12-24 DIAGNOSIS — D45 Polycythemia vera: Secondary | ICD-10-CM | POA: Diagnosis present

## 2023-12-24 LAB — CBC WITH DIFFERENTIAL (CANCER CENTER ONLY)
Abs Immature Granulocytes: 0.07 10*3/uL (ref 0.00–0.07)
Basophils Absolute: 0.1 10*3/uL (ref 0.0–0.1)
Basophils Relative: 1 %
Eosinophils Absolute: 0.2 10*3/uL (ref 0.0–0.5)
Eosinophils Relative: 4 %
HCT: 41.6 % (ref 36.0–46.0)
Hemoglobin: 11.8 g/dL — ABNORMAL LOW (ref 12.0–15.0)
Immature Granulocytes: 1 %
Lymphocytes Relative: 27 %
Lymphs Abs: 1.7 10*3/uL (ref 0.7–4.0)
MCH: 17.9 pg — ABNORMAL LOW (ref 26.0–34.0)
MCHC: 28.4 g/dL — ABNORMAL LOW (ref 30.0–36.0)
MCV: 63.1 fL — ABNORMAL LOW (ref 80.0–100.0)
Monocytes Absolute: 0.3 10*3/uL (ref 0.1–1.0)
Monocytes Relative: 5 %
Neutro Abs: 3.9 10*3/uL (ref 1.7–7.7)
Neutrophils Relative %: 62 %
Platelet Count: 306 10*3/uL (ref 150–400)
RBC: 6.59 MIL/uL — ABNORMAL HIGH (ref 3.87–5.11)
RDW: 22.9 % — ABNORMAL HIGH (ref 11.5–15.5)
WBC Count: 6.3 10*3/uL (ref 4.0–10.5)
nRBC: 0 % (ref 0.0–0.2)

## 2023-12-24 LAB — LACTATE DEHYDROGENASE: LDH: 330 U/L — ABNORMAL HIGH (ref 98–192)

## 2023-12-24 LAB — CMP (CANCER CENTER ONLY)
ALT: 26 U/L (ref 0–44)
AST: 25 U/L (ref 15–41)
Albumin: 4.8 g/dL (ref 3.5–5.0)
Alkaline Phosphatase: 71 U/L (ref 38–126)
Anion gap: 8 (ref 5–15)
BUN: 20 mg/dL (ref 8–23)
CO2: 27 mmol/L (ref 22–32)
Calcium: 10.2 mg/dL (ref 8.9–10.3)
Chloride: 107 mmol/L (ref 98–111)
Creatinine: 1.28 mg/dL — ABNORMAL HIGH (ref 0.44–1.00)
GFR, Estimated: 46 mL/min — ABNORMAL LOW (ref >60)
Glucose, Bld: 94 mg/dL (ref 70–99)
Potassium: 4.5 mmol/L (ref 3.5–5.1)
Sodium: 142 mmol/L (ref 135–145)
Total Bilirubin: 0.8 mg/dL (ref 0.0–1.2)
Total Protein: 6.6 g/dL (ref 6.5–8.1)

## 2023-12-24 LAB — FERRITIN: Ferritin: 34 ng/mL (ref 11–307)

## 2023-12-24 LAB — SAVE SMEAR(SSMR), FOR PROVIDER SLIDE REVIEW

## 2023-12-24 NOTE — Progress Notes (Signed)
 Hematology and Oncology Follow Up Visit  [name] [id number] 1956/03/22 68 y.o. 12/24/2023   Principle Diagnosis:  Polycythemia vera, JAK 2 positive    Current Therapy:        Phlebotomy as indicated to maintain Hct < 45%    Aspirin  81 mg p.o. daily    Jakifi 10 mg po BID -- start on 11/28/2023      Interim History:  [name] is here today for follow-up for her polycythemia vera.  She is now on Jakafi .  She is actually done quite well on the Jakafi .  She has had no side effects from the Jakafi .  She has had no issues with nausea or vomiting.  There is been no issues with diarrhea.  She has had no rashes.  I am happy that she has done well with the Jakafi .  In fact, her platelet count is now down to 306,000.  I would like to hope that her spleen also will be decreased in size.  We probably will recheck another unfortunately, I think that we are going to have to probably get her on Jakafi  at this point.  We will probably check another splenic ultrasound in about 3 months.  She has had no diarrhea.  There has been no bleeding.  She has had no mouth sores.  She has had no visual changes.  Overall, I would say that her performance status is ECOG 1.    Wt Readings from Last 3 Encounters:  12/24/23 222 lb 6.4 oz (100.9 kg)  12/02/23 217 lb (98.4 kg)  11/24/23 220 lb (99.8 kg)      Medications:  Allergies as of 12/24/2023       Reactions   Ambien  [zolpidem ] Other (See Comments)   Sleep walking   Percocet [oxycodone -acetaminophen ] Other (See Comments)   SYNCOPE TACHYCARDIA   Shrimp [shellfish Allergy] Anaphylaxis, Itching, Nausea And Vomiting, Swelling   Hydrocodone -acetaminophen  Nausea And Vomiting, Other (See Comments)   Dizziness, also   Lipitor [atorvastatin ] Other (See Comments)   Ear pain/fullness in ear "as if I were under water "   Saxenda  [liraglutide  -weight Management] Hives, Itching, Other (See Comments)   Hives/itching/site reaction   Adhesive [tape]  Rash, Other (See Comments)   EKG leads and certain bandages break out the skin   Belviq Xr [lorcaserin  Hcl Er] Other (See Comments)   Gained weight    Ferrous Sulfate  Nausea Only   Morphine  And Codeine Nausea And Vomiting        Medication List        Accurate as of Dec 24, 2023  4:08 PM. If you have any questions, ask your nurse or doctor.          acetaminophen  325 MG tablet Commonly known as: TYLENOL  Take 325-650 mg by mouth every 6 (six) hours as needed for mild pain or headache.   albuterol  108 (90 Base) MCG/ACT inhaler Commonly known as: VENTOLIN  HFA Inhale 2 puffs into the lungs every 6 (six) hours as needed for wheezing or shortness of breath.   Aspirin  81 MG Caps Take 81 mg by mouth daily at 6 (six) AM.   B-12 PO Take 1 tablet by mouth daily.   calcium  carbonate 500 MG chewable tablet Commonly known as: TUMS - dosed in mg elemental calcium  Chew 1 tablet by mouth as needed for indigestion or heartburn.   cetirizine  5 MG tablet Commonly known as: ZYRTEC  Take 10 mg by mouth daily.   cholecalciferol  25 MCG (1000 UNIT) tablet  Commonly known as: VITAMIN D3 Take 1,000 Units by mouth daily.   citalopram  20 MG tablet Commonly known as: CELEXA  TAKE 1 TABLET DAILY   levothyroxine  125 MCG tablet Commonly known as: SYNTHROID  TAKE 1 TABLET BY MOUTH EVERY DAY BEFORE BREAKFAST   lisinopril  5 MG tablet Commonly known as: ZESTRIL  TAKE 1 TABLET (5 MG TOTAL) BY MOUTH DAILY.   LUBRICATING EYE DROPS OP Place 1 drop into both eyes as needed (dry eyes).   methocarbamol  500 MG tablet Commonly known as: ROBAXIN  TAKE 1 TABLET BY MOUTH THREE TIMES A DAY   metoprolol  succinate 25 MG 24 hr tablet Commonly known as: TOPROL -XL TAKE 1 TABLET (25 MG TOTAL) BY MOUTH DAILY.   pantoprazole  40 MG tablet Commonly known as: PROTONIX  TAKE 1 TABLET BY MOUTH EVERY DAY   promethazine  25 MG tablet Commonly known as: PHENERGAN  TAKE 1 TABLET (25 MG TOTAL) BY MOUTH AS NEEDED.    rosuvastatin  10 MG tablet Commonly known as: CRESTOR  TAKE 1 TABLET BY MOUTH EVERY DAY   ruxolitinib  phosphate 5 MG tablet Commonly known as: JAKAFI  Take 1 tablet (5 mg total) by mouth 2 (two) times daily.   traMADol  50 MG tablet Commonly known as: ULTRAM  Take 50 mg by mouth every 6 (six) hours as needed.   traZODone  50 MG tablet Commonly known as: DESYREL  TAKE 0.5-1 TABLETS BY MOUTH AT BEDTIME AS NEEDED FOR SLEEP. What changed: See the new instructions.        Allergies:  Allergies  Allergen Reactions   Ambien  [Zolpidem ] Other (See Comments)    Sleep walking   Percocet [Oxycodone -Acetaminophen ] Other (See Comments)    SYNCOPE TACHYCARDIA   Shrimp [Shellfish Allergy] Anaphylaxis, Itching, Nausea And Vomiting and Swelling   Hydrocodone -Acetaminophen  Nausea And Vomiting and Other (See Comments)    Dizziness, also   Lipitor [Atorvastatin ] Other (See Comments)    Ear pain/fullness in ear "as if I were under water "   Saxenda  [Liraglutide  -Weight Management] Hives, Itching and Other (See Comments)    Hives/itching/site reaction   Adhesive [Tape] Rash and Other (See Comments)    EKG leads and certain bandages break out the skin   Belviq Xr [Lorcaserin  Hcl Er] Other (See Comments)    Gained weight    Ferrous Sulfate  Nausea Only   Morphine  And Codeine Nausea And Vomiting    Past Medical History, Surgical history, Social history, and Family History were reviewed and updated.  Review of Systems:  Review of Systems  Constitutional: Negative.   HENT: Negative.    Eyes: Negative.   Respiratory: Negative.    Cardiovascular: Negative.   Gastrointestinal: Negative.   Genitourinary: Negative.   Musculoskeletal: Negative.   Skin: Negative.   Neurological: Negative.   Endo/Heme/Allergies: Negative.   Psychiatric/Behavioral: Negative.       Physical Exam:  weight is 222 lb 6.4 oz (100.9 kg). Her oral temperature is 97.8 F (36.6 C). Her blood pressure is 142/53  (abnormal) and her pulse is 47 (abnormal). Her respiration is 18 and oxygen saturation is 100%.   Wt Readings from Last 3 Encounters:  12/24/23 222 lb 6.4 oz (100.9 kg)  12/02/23 217 lb (98.4 kg)  11/24/23 220 lb (99.8 kg)   Physical Exam Vitals reviewed.  HENT:     Head: Normocephalic and atraumatic.  Eyes:     Pupils: Pupils are equal, round, and reactive to light.  Cardiovascular:     Rate and Rhythm: Normal rate and regular rhythm.     Heart sounds: Normal heart  sounds.  Pulmonary:     Effort: Pulmonary effort is normal.     Breath sounds: Normal breath sounds.  Abdominal:     General: Bowel sounds are normal.     Palpations: Abdomen is soft.  Musculoskeletal:        General: No tenderness or deformity. Normal range of motion.     Cervical back: Normal range of motion.  Lymphadenopathy:     Cervical: No cervical adenopathy.  Skin:    General: Skin is warm and dry.     Findings: No erythema or rash.  Neurological:     Mental Status: She is alert and oriented to person, place, and time.  Psychiatric:        Behavior: Behavior normal.        Thought Content: Thought content normal.        Judgment: Judgment normal.     Lab Results  Component Value Date   WBC 6.3 12/24/2023   HGB 11.8 (L) 12/24/2023   HCT 41.6 12/24/2023   MCV 63.1 (L) 12/24/2023   PLT 306 12/24/2023   Lab Results  Component Value Date   FERRITIN 9 (L) 11/24/2023   IRON  21 (L) 11/24/2023   TIBC 545 (H) 11/24/2023   UIBC 524 (H) 11/24/2023   IRONPCTSAT 4 (L) 11/24/2023   Lab Results  Component Value Date   RETICCTPCT 2.2 11/24/2023   RBC 6.59 (H) 12/24/2023   No results found for: "KPAFRELGTCHN", "LAMBDASER", "KAPLAMBRATIO" No results found for: "IGGSERUM", "IGA", "IGMSERUM" No results found for: "TOTALPROTELP", "ALBUMINELP", "A1GS", "A2GS", "BETS", "BETA2SER", "GAMS", "MSPIKE", "SPEI"   Chemistry      Component Value Date/Time   NA 142 12/24/2023 1514   K 4.5 12/24/2023 1514   CL  107 12/24/2023 1514   CO2 27 12/24/2023 1514   BUN 20 12/24/2023 1514   CREATININE 1.28 (H) 12/24/2023 1514   CREATININE 1.18 (H) 12/11/2021 1221      Component Value Date/Time   CALCIUM  10.2 12/24/2023 1514   ALKPHOS 71 12/24/2023 1514   AST 25 12/24/2023 1514   ALT 26 12/24/2023 1514   BILITOT 0.8 12/24/2023 1514     Impression and Plan: Ms. Tanna is a very pleasant 68 yo caucasian female with polycythemia.  She is JAK2 positive.    Right now, we will keep her on the Jakafi .  I would like to see her back in about 6 weeks or so.  If everything looks okay, then I will see about moving her points out little bit longer.  I am happy that the Jakafi  is working.  There is certainly is making things a bit easier for her.  We do not have to phlebotomize her.   Ivor Mars, MD 5/16/20254:08 PM

## 2023-12-27 LAB — IRON AND IRON BINDING CAPACITY (CC-WL,HP ONLY)
Iron: 152 ug/dL (ref 28–170)
Saturation Ratios: 28 % (ref 10.4–31.8)
TIBC: 536 ug/dL — ABNORMAL HIGH (ref 250–450)
UIBC: 384 ug/dL (ref 148–442)

## 2024-01-04 ENCOUNTER — Other Ambulatory Visit: Payer: Self-pay | Admitting: Physician Assistant

## 2024-01-04 DIAGNOSIS — M545 Low back pain, unspecified: Secondary | ICD-10-CM

## 2024-01-04 DIAGNOSIS — M51362 Other intervertebral disc degeneration, lumbar region with discogenic back pain and lower extremity pain: Secondary | ICD-10-CM

## 2024-01-25 ENCOUNTER — Inpatient Hospital Stay (HOSPITAL_BASED_OUTPATIENT_CLINIC_OR_DEPARTMENT_OTHER): Admitting: Hematology & Oncology

## 2024-01-25 ENCOUNTER — Inpatient Hospital Stay: Attending: Hematology & Oncology

## 2024-01-25 ENCOUNTER — Encounter: Payer: Self-pay | Admitting: Family

## 2024-01-25 VITALS — BP 149/60 | HR 55 | Temp 97.8°F | Resp 18 | Wt 226.4 lb

## 2024-01-25 DIAGNOSIS — Z7982 Long term (current) use of aspirin: Secondary | ICD-10-CM | POA: Insufficient documentation

## 2024-01-25 DIAGNOSIS — Z79899 Other long term (current) drug therapy: Secondary | ICD-10-CM | POA: Diagnosis not present

## 2024-01-25 DIAGNOSIS — D500 Iron deficiency anemia secondary to blood loss (chronic): Secondary | ICD-10-CM | POA: Diagnosis not present

## 2024-01-25 DIAGNOSIS — D45 Polycythemia vera: Secondary | ICD-10-CM

## 2024-01-25 LAB — CBC WITH DIFFERENTIAL (CANCER CENTER ONLY)
Abs Immature Granulocytes: 0.02 10*3/uL (ref 0.00–0.07)
Basophils Absolute: 0 10*3/uL (ref 0.0–0.1)
Basophils Relative: 1 %
Eosinophils Absolute: 0.1 10*3/uL (ref 0.0–0.5)
Eosinophils Relative: 3 %
HCT: 35 % — ABNORMAL LOW (ref 36.0–46.0)
Hemoglobin: 10.5 g/dL — ABNORMAL LOW (ref 12.0–15.0)
Immature Granulocytes: 1 %
Lymphocytes Relative: 30 %
Lymphs Abs: 1.2 10*3/uL (ref 0.7–4.0)
MCH: 19.9 pg — ABNORMAL LOW (ref 26.0–34.0)
MCHC: 30 g/dL (ref 30.0–36.0)
MCV: 66.3 fL — ABNORMAL LOW (ref 80.0–100.0)
Monocytes Absolute: 0.3 10*3/uL (ref 0.1–1.0)
Monocytes Relative: 7 %
Neutro Abs: 2.3 10*3/uL (ref 1.7–7.7)
Neutrophils Relative %: 58 %
Platelet Count: 225 10*3/uL (ref 150–400)
RBC: 5.28 MIL/uL — ABNORMAL HIGH (ref 3.87–5.11)
RDW: 27.9 % — ABNORMAL HIGH (ref 11.5–15.5)
WBC Count: 3.9 10*3/uL — ABNORMAL LOW (ref 4.0–10.5)
nRBC: 0 % (ref 0.0–0.2)

## 2024-01-25 LAB — IRON AND IRON BINDING CAPACITY (CC-WL,HP ONLY)
Iron: 131 ug/dL (ref 28–170)
Saturation Ratios: 31 % (ref 10.4–31.8)
TIBC: 424 ug/dL (ref 250–450)
UIBC: 293 ug/dL (ref 148–442)

## 2024-01-25 LAB — CMP (CANCER CENTER ONLY)
ALT: 35 U/L (ref 0–44)
AST: 24 U/L (ref 15–41)
Albumin: 4.8 g/dL (ref 3.5–5.0)
Alkaline Phosphatase: 100 U/L (ref 38–126)
Anion gap: 7 (ref 5–15)
BUN: 18 mg/dL (ref 8–23)
CO2: 28 mmol/L (ref 22–32)
Calcium: 9.7 mg/dL (ref 8.9–10.3)
Chloride: 108 mmol/L (ref 98–111)
Creatinine: 1.25 mg/dL — ABNORMAL HIGH (ref 0.44–1.00)
GFR, Estimated: 47 mL/min — ABNORMAL LOW (ref >60)
Glucose, Bld: 107 mg/dL — ABNORMAL HIGH (ref 70–99)
Potassium: 4.4 mmol/L (ref 3.5–5.1)
Sodium: 143 mmol/L (ref 135–145)
Total Bilirubin: 1 mg/dL (ref 0.0–1.2)
Total Protein: 6.5 g/dL (ref 6.5–8.1)

## 2024-01-25 LAB — FERRITIN: Ferritin: 72 ng/mL (ref 11–307)

## 2024-01-25 LAB — SAVE SMEAR(SSMR), FOR PROVIDER SLIDE REVIEW

## 2024-01-25 LAB — LACTATE DEHYDROGENASE: LDH: 307 U/L — ABNORMAL HIGH (ref 98–192)

## 2024-01-25 NOTE — Progress Notes (Signed)
 Hematology and Oncology Follow Up Visit  [name] [id number] Nov 12, 1955 68 y.o. 01/25/2024   Principle Diagnosis:  Polycythemia vera, JAK 2 positive    Current Therapy:        Phlebotomy as indicated to maintain Hct < 45%    Aspirin  81 mg p.o. daily    Jakifi 5 mg po BID -- started on 11/28/2023 - decreased to 5 mg PO daily on 01/25/2024   Interim History:  [name] is here today with her daughter for follow-up. She notes feeling fatigued and has had episodes of vertigo lately.  No falls or syncope reported.   She is taking her Jakafi  5 mg PO BID.  Hgb is down to 10.5, WBC count 3.9 and platelets 225.  No fever, chills, n/v, cough, rash, SOB, chest pain, abdominal pain or changes in bowel or bladder habits.  She has not noted any obvious blood loss. No abnormal bruising, no petechiae.  No falls or syncope reported.  Appetite and hydration are good. Weight is stable at 226 lbs.   ECOG Performance Status: 1 - Symptomatic but completely ambulatory  Medications:  Allergies as of 01/25/2024       Reactions   Ambien  [zolpidem ] Other (See Comments)   Sleep walking   Percocet [oxycodone -acetaminophen ] Other (See Comments)   SYNCOPE TACHYCARDIA   Shrimp [shellfish Allergy] Anaphylaxis, Itching, Nausea And Vomiting, Swelling   Hydrocodone -acetaminophen  Nausea And Vomiting, Other (See Comments)   Dizziness, also   Lipitor [atorvastatin ] Other (See Comments)   Ear pain/fullness in ear as if I were under water    Saxenda  [liraglutide  -weight Management] Hives, Itching, Other (See Comments)   Hives/itching/site reaction   Adhesive [tape] Rash, Other (See Comments)   EKG leads and certain bandages break out the skin   Belviq Xr [lorcaserin  Hcl Er] Other (See Comments)   Gained weight    Ferrous Sulfate  Nausea Only   Morphine  And Codeine Nausea And Vomiting        Medication List        Accurate as of January 25, 2024  1:11 PM. If you have any questions, ask your nurse  or doctor.          acetaminophen  325 MG tablet Commonly known as: TYLENOL  Take 325-650 mg by mouth every 6 (six) hours as needed for mild pain or headache.   albuterol  108 (90 Base) MCG/ACT inhaler Commonly known as: VENTOLIN  HFA Inhale 2 puffs into the lungs every 6 (six) hours as needed for wheezing or shortness of breath.   Aspirin  81 MG Caps Take 81 mg by mouth daily at 6 (six) AM.   B-12 PO Take 1 tablet by mouth daily.   calcium  carbonate 500 MG chewable tablet Commonly known as: TUMS - dosed in mg elemental calcium  Chew 1 tablet by mouth as needed for indigestion or heartburn.   cetirizine  5 MG tablet Commonly known as: ZYRTEC  Take 10 mg by mouth daily.   cholecalciferol  25 MCG (1000 UNIT) tablet Commonly known as: VITAMIN D3 Take 1,000 Units by mouth daily.   citalopram  20 MG tablet Commonly known as: CELEXA  TAKE 1 TABLET DAILY What changed:  how much to take how to take this when to take this additional instructions   levothyroxine  125 MCG tablet Commonly known as: SYNTHROID  TAKE 1 TABLET BY MOUTH EVERY DAY BEFORE BREAKFAST   lisinopril  5 MG tablet Commonly known as: ZESTRIL  TAKE 1 TABLET (5 MG TOTAL) BY MOUTH DAILY.   LUBRICATING EYE DROPS OP Place 1  drop into both eyes as needed (dry eyes).   methocarbamol  500 MG tablet Commonly known as: ROBAXIN  TAKE 1 TABLET BY MOUTH THREE TIMES A DAY   metoprolol  succinate 25 MG 24 hr tablet Commonly known as: TOPROL -XL TAKE 1 TABLET (25 MG TOTAL) BY MOUTH DAILY.   pantoprazole  40 MG tablet Commonly known as: PROTONIX  TAKE 1 TABLET BY MOUTH EVERY DAY   promethazine  25 MG tablet Commonly known as: PHENERGAN  TAKE 1 TABLET (25 MG TOTAL) BY MOUTH AS NEEDED.   rosuvastatin  10 MG tablet Commonly known as: CRESTOR  TAKE 1 TABLET BY MOUTH EVERY DAY   ruxolitinib  phosphate 5 MG tablet Commonly known as: JAKAFI  Take 1 tablet (5 mg total) by mouth 2 (two) times daily.   traMADol  50 MG tablet Commonly  known as: ULTRAM  Take 50 mg by mouth every 6 (six) hours as needed.   traZODone  50 MG tablet Commonly known as: DESYREL  TAKE 0.5-1 TABLETS BY MOUTH AT BEDTIME AS NEEDED FOR SLEEP. What changed: See the new instructions.        Allergies:  Allergies  Allergen Reactions   Ambien  [Zolpidem ] Other (See Comments)    Sleep walking   Percocet [Oxycodone -Acetaminophen ] Other (See Comments)    SYNCOPE TACHYCARDIA   Shrimp [Shellfish Allergy] Anaphylaxis, Itching, Nausea And Vomiting and Swelling   Hydrocodone -Acetaminophen  Nausea And Vomiting and Other (See Comments)    Dizziness, also   Lipitor [Atorvastatin ] Other (See Comments)    Ear pain/fullness in ear as if I were under water    Saxenda  [Liraglutide  -Weight Management] Hives, Itching and Other (See Comments)    Hives/itching/site reaction   Adhesive [Tape] Rash and Other (See Comments)    EKG leads and certain bandages break out the skin   Belviq Xr [Lorcaserin  Hcl Er] Other (See Comments)    Gained weight    Ferrous Sulfate  Nausea Only   Morphine  And Codeine Nausea And Vomiting    Past Medical History, Surgical history, Social history, and Family History were reviewed and updated.  Review of Systems: All other 10 point review of systems is negative.   Physical Exam:  weight is 226 lb 6.4 oz (102.7 kg). Her oral temperature is 97.8 F (36.6 C). Her blood pressure is 149/60 (abnormal) and her pulse is 55 (abnormal). Her respiration is 18 and oxygen saturation is 98%.   Wt Readings from Last 3 Encounters:  01/25/24 226 lb 6.4 oz (102.7 kg)  12/24/23 222 lb 6.4 oz (100.9 kg)  12/02/23 217 lb (98.4 kg)    Ocular: Sclerae unicteric, pupils equal, round and reactive to light Ear-nose-throat: Oropharynx clear, dentition fair Lymphatic: No cervical or supraclavicular adenopathy Lungs no rales or rhonchi, good excursion bilaterally Heart regular rate and rhythm, no murmur appreciated Abd soft, nontender, positive bowel  sounds MSK no focal spinal tenderness, no joint edema Neuro: non-focal, well-oriented, appropriate affect Breasts: Deferred   Lab Results  Component Value Date   WBC 3.9 (L) 01/25/2024   HGB 10.5 (L) 01/25/2024   HCT 35.0 (L) 01/25/2024   MCV 66.3 (L) 01/25/2024   PLT 225 01/25/2024   Lab Results  Component Value Date   FERRITIN 34 12/24/2023   IRON  152 12/24/2023   TIBC 536 (H) 12/24/2023   UIBC 384 12/24/2023   IRONPCTSAT 28 12/24/2023   Lab Results  Component Value Date   RETICCTPCT 2.2 11/24/2023   RBC 5.28 (H) 01/25/2024   No results found for: KPAFRELGTCHN, LAMBDASER, KAPLAMBRATIO No results found for: IGGSERUM, IGA, IGMSERUM No results found  for: Tanner Fanny, A1GS, A2GS, Metta Actis, SPEI   Chemistry      Component Value Date/Time   NA 143 01/25/2024 1214   K 4.4 01/25/2024 1214   CL 108 01/25/2024 1214   CO2 28 01/25/2024 1214   BUN 18 01/25/2024 1214   CREATININE 1.25 (H) 01/25/2024 1214   CREATININE 1.18 (H) 12/11/2021 1221      Component Value Date/Time   CALCIUM  9.7 01/25/2024 1214   ALKPHOS 100 01/25/2024 1214   AST 24 01/25/2024 1214   ALT 35 01/25/2024 1214   BILITOT 1.0 01/25/2024 1214       Impression and Plan: Ms. Blaszczyk is a very pleasant 68 yo caucasian female with polycythemia, JAK 2 positive.  We will decrease her Jakifi to 5 mg PO daily and see how she tolerates this and if her symptoms and lab work improve.  Follow-up to reassess in 5 weeks.   Kennard Pea, NP 6/17/20251:11 PM

## 2024-02-07 ENCOUNTER — Ambulatory Visit: Admitting: Cardiology

## 2024-02-10 ENCOUNTER — Other Ambulatory Visit: Payer: Self-pay | Admitting: Physician Assistant

## 2024-02-10 DIAGNOSIS — M51362 Other intervertebral disc degeneration, lumbar region with discogenic back pain and lower extremity pain: Secondary | ICD-10-CM

## 2024-02-10 DIAGNOSIS — M545 Low back pain, unspecified: Secondary | ICD-10-CM

## 2024-02-18 ENCOUNTER — Encounter: Payer: Self-pay | Admitting: Physician Assistant

## 2024-02-18 ENCOUNTER — Ambulatory Visit (INDEPENDENT_AMBULATORY_CARE_PROVIDER_SITE_OTHER): Admitting: Physician Assistant

## 2024-02-18 VITALS — BP 155/59 | HR 73 | Ht 64.0 in | Wt 229.0 lb

## 2024-02-18 DIAGNOSIS — R030 Elevated blood-pressure reading, without diagnosis of hypertension: Secondary | ICD-10-CM | POA: Diagnosis not present

## 2024-02-18 DIAGNOSIS — I10 Essential (primary) hypertension: Secondary | ICD-10-CM

## 2024-02-18 DIAGNOSIS — M65312 Trigger thumb, left thumb: Secondary | ICD-10-CM | POA: Insufficient documentation

## 2024-02-18 NOTE — Patient Instructions (Addendum)
 Diclofenac  topical twice a day as needed after icing.  Trigger Finger  Trigger finger, also called stenosing tenosynovitis, is a condition that causes a finger or thumb to get stuck in a bent position. Each finger has tough, cord-like tissue (tendon) that connects muscle to bone, and each tendon passes through a tunnel of tissue (tendon sheath). The tendon sheaths are held close to the bone by a pulley. There is a pulley called the A1 pulley that is involved in the triggering of a finger or thumb. To move your finger, your tendon needs to glide freely through the sheath. Trigger finger happens when the tendon or the sheath thickens, making it difficult to bend or straighten your finger as the thickened tendon gets stuck in the A1 pulley. Trigger finger can affect any of the fingers or the thumbs. Mild cases may clear up with rest and medicine. Severe cases require more treatment. What are the causes? Trigger finger or thumb is caused by a thickened finger tendon or tendon sheath. The cause of this thickening is not known. What increases the risk? The following factors may make you more likely to develop this condition: Doing the same movements many times (repetitive activity) that require a strong grip. Having certain health conditions. These include rheumatoid arthritis, gout, carpal tunnel syndrome, or diabetes. Being 50-60 years old. Being female. What are the signs or symptoms? Symptoms of this condition include: Pain when bending or straightening your finger. Tenderness, swelling, or a lump in the palm of your hand just below the finger joint. Hearing a noise like a pop or a snap when you try to straighten your finger. Feeling a catch or locked feeling when you try to straighten your finger. Being unable to straighten your finger without help from your other hand. How is this diagnosed? This condition is diagnosed based on your symptoms and a physical exam. How is this treated? This  condition may be treated by: Resting your finger and avoiding activities that make symptoms worse. Wearing a finger splint to keep your finger extended. Taking NSAIDs, such as ibuprofen, to relieve pain and swelling around the tendon. Doing gentle exercises to stretch the finger as told by your health care provider. Having medicine that reduces swelling and inflammation (steroids) injected into the tendon sheath. Injections may need to be repeated. Trigger finger release. This surgery is done to open the pulley. This may be done if other treatments do not work and you cannot straighten or bend your finger. You may need hand therapy after surgery. Follow these instructions at home: If you have a removable splint: Wear the splint as told by your provider. Remove it only as told by your provider. Check the skin around the splint every day. Tell your provider about any concerns. Loosen the splint if your fingers tingle, become numb, or turn cold and blue. Keep the splint clean and dry. If the splint is not waterproof: Do not let it get wet. Cover it with a watertight covering when you take a bath or shower. Managing pain, stiffness, and swelling     If told, put ice on the painful area. If you have a removable splint, remove it as told by your health care provider. Put ice in a plastic bag. Place a towel between your skin and the bag or between your splint and the bag. Leave the ice on for 20 minutes, 2-3 times a day. If told, apply heat to the affected area as often as told by your provider.  Use the heat source that your provider recommends, such as a moist heat pack or a heating pad. Place a towel between your skin and the heat source. Leave the heat on for 20-30 minutes. If your skin turns bright red, remove the ice or heat right away to prevent skin damage. The risk of damage is higher if you cannot feel pain, heat, or cold. Activity Rest your finger as told by your provider. Avoid  activities that make the pain worse. Return to your normal activities as told by your provider. Ask your provider what activities are safe for you. Do exercises as told by your provider. Ask your provider when it is safe to drive if you have a splint on your hand. General instructions Take over-the-counter and prescription medicines only as told by your provider. Keep all follow-up visits. These are needed to see how you are progressing. Contact a health care provider if: Your symptoms are not improving with home care. This information is not intended to replace advice given to you by your health care provider. Make sure you discuss any questions you have with your health care provider. Document Revised: 03/13/2022 Document Reviewed: 03/13/2022 Elsevier Patient Education  2024 [employer].

## 2024-02-18 NOTE — Progress Notes (Signed)
 Acute Office Visit  Subjective:     Patient ID: [name], female    DOB: Dec 31, 1955, 68 y.o.   MRN: [id number]  Chief Complaint  Patient presents with   Medical Management of Chronic Issues    Left thumb clicking    Patient is in today for clicking and swelling in her left thumb for about 3 weeks. Patient states she thinks this is trigger finger, as she has had trigger finger before in her thumb (unable to recall right or left) and received a steroid injection in it. She endorses trying tylenol  for pain but has not had any relief with the pain. She is able to move her thumb with the clicking in both of the distal joint and MCP joint but says she can feel the thumb getting stuck at the DIP joint. She endorses swelling at the base of the left thumb and pain that worsens with palpation and movement. She denies any loss of movement or numbness in her thumb.     Patient's BP was elevated today (180/73), which she states is abnormal. BP came down with later reading in visit, but still elevated (155/59). Patient states her BP at home is normally 105/50s.   Review of Systems  All other systems reviewed and are negative.      Objective:    BP (!) 155/59   Pulse 73   Ht 5' 4 (1.626 m)   Wt 229 lb (103.9 kg)   SpO2 99%   BMI 39.31 kg/m  BP Readings from Last 3 Encounters:  02/18/24 (!) 155/59  01/25/24 (!) 149/60  12/24/23 (!) 142/53      Physical Exam Vitals reviewed.  Constitutional:      Appearance: Normal appearance.  Musculoskeletal:        General: Normal range of motion.     Comments: Swelling at the base of the left thumb, DIP joint of thumb will get stuck in flexed position sporadically after movement.   Skin:    General: Skin is warm and dry.  Neurological:     Mental Status: She is alert.         Assessment & Plan:  SABRASABRADanika was seen today for medical management of chronic issues.  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 Educated patient on importance of splint to keep thumb straight and help with inflammation. Provided image of thumb spica splint and recommended purchasing on [employer].  - Provided patient with handout of physical therapy exercises for trigger finger. - Discussed potentially need a steroid injection at the joint again if there is no improvement from this treatment plan. Recommended reaching out to her orthopedist that she has seen before or scheduling an appointment with Dr. ONEIDA for the injection if needed.  Elevated blood pressure reading  - BP 180/73 ; recheck in office: 155/59 -continue lisinopril  5mg  daily - Patient states she has never had high BP before and she will occasionally check it at home with her husbands BP cuff (readings are normally around 105/50 per patient) - Prior BP readings at other recent hematology office visits were both elevated (  149/60 and 142/53) in May and June. Earlier appointments have recorded Bps within a normal range.  - Encouraged patient to continue to recheck BP at home and to note any consistent elevations; will follow-up with this as needed   Return if symptoms worsen or fail to improve.  Tyrion Glaude, PA-C

## 2024-02-23 ENCOUNTER — Other Ambulatory Visit: Payer: Self-pay | Admitting: Physician Assistant

## 2024-02-23 DIAGNOSIS — I10 Essential (primary) hypertension: Secondary | ICD-10-CM

## 2024-02-28 ENCOUNTER — Other Ambulatory Visit: Payer: Self-pay | Admitting: Physician Assistant

## 2024-02-28 DIAGNOSIS — F5101 Primary insomnia: Secondary | ICD-10-CM

## 2024-02-28 NOTE — Telephone Encounter (Signed)
 Copied from CRM [id number]. Topic: Clinical - Medication Refill >> Feb 28, 2024 10:37 AM Adrianna P wrote: Medication: lisinopril  (ZESTRIL ) 5 MG tablet  Has the patient contacted their pharmacy? Yes (Agent: If no, request that the patient contact the pharmacy for the refill. If patient does not wish to contact the pharmacy document the reason why and proceed with request.) (Agent: If yes, when and what did the pharmacy advise?)  This is the patient's preferred pharmacy:  [facility] [phone] - [place] - [address] [place] [address] Phone: [phone] Fax: [phone]  [facility] [address] [place] [address] Phone: [phone] Fax: [phone]  [facility] - [place] - [address] [facility] [address] [place] [address] Phone: [phone] Fax: [phone]  Is this the correct pharmacy for this prescription? Yes If no, delete pharmacy and type the correct one.   Has the prescription been filled recently? No  Is the patient out of the medication? No  Has the patient been seen for an appointment in the last year OR does the patient have an upcoming appointment? Yes  Can we respond through MyChart? Yes  Agent: Please be advised that Rx refills may take up to 3 business days. We ask that you follow-up with your pharmacy.

## 2024-02-29 ENCOUNTER — Encounter: Payer: Self-pay | Admitting: Medical Oncology

## 2024-02-29 ENCOUNTER — Inpatient Hospital Stay

## 2024-02-29 ENCOUNTER — Inpatient Hospital Stay: Attending: Hematology & Oncology

## 2024-02-29 ENCOUNTER — Inpatient Hospital Stay (HOSPITAL_BASED_OUTPATIENT_CLINIC_OR_DEPARTMENT_OTHER): Admitting: Medical Oncology

## 2024-02-29 VITALS — BP 142/48 | HR 49 | Temp 97.9°F | Resp 18 | Ht 64.0 in | Wt 225.4 lb

## 2024-02-29 DIAGNOSIS — D45 Polycythemia vera: Secondary | ICD-10-CM | POA: Insufficient documentation

## 2024-02-29 DIAGNOSIS — Z79890 Hormone replacement therapy (postmenopausal): Secondary | ICD-10-CM | POA: Insufficient documentation

## 2024-02-29 DIAGNOSIS — D500 Iron deficiency anemia secondary to blood loss (chronic): Secondary | ICD-10-CM | POA: Diagnosis not present

## 2024-02-29 DIAGNOSIS — Z79899 Other long term (current) drug therapy: Secondary | ICD-10-CM | POA: Diagnosis not present

## 2024-02-29 DIAGNOSIS — Z7982 Long term (current) use of aspirin: Secondary | ICD-10-CM | POA: Diagnosis not present

## 2024-02-29 LAB — CBC WITH DIFFERENTIAL (CANCER CENTER ONLY)
Abs Immature Granulocytes: 0.07 K/uL (ref 0.00–0.07)
Basophils Absolute: 0.1 K/uL (ref 0.0–0.1)
Basophils Relative: 1 %
Eosinophils Absolute: 0.2 K/uL (ref 0.0–0.5)
Eosinophils Relative: 3 %
HCT: 37.4 % (ref 36.0–46.0)
Hemoglobin: 11.5 g/dL — ABNORMAL LOW (ref 12.0–15.0)
Immature Granulocytes: 1 %
Lymphocytes Relative: 28 %
Lymphs Abs: 1.7 K/uL (ref 0.7–4.0)
MCH: 23 pg — ABNORMAL LOW (ref 26.0–34.0)
MCHC: 30.7 g/dL (ref 30.0–36.0)
MCV: 74.7 fL — ABNORMAL LOW (ref 80.0–100.0)
Monocytes Absolute: 0.5 K/uL (ref 0.1–1.0)
Monocytes Relative: 9 %
Neutro Abs: 3.5 K/uL (ref 1.7–7.7)
Neutrophils Relative %: 58 %
Platelet Count: 366 K/uL (ref 150–400)
RBC: 5.01 MIL/uL (ref 3.87–5.11)
WBC Count: 6 K/uL (ref 4.0–10.5)
nRBC: 0.3 % — ABNORMAL HIGH (ref 0.0–0.2)

## 2024-02-29 LAB — CMP (CANCER CENTER ONLY)
ALT: 38 U/L (ref 0–44)
AST: 36 U/L (ref 15–41)
Albumin: 4.7 g/dL (ref 3.5–5.0)
Alkaline Phosphatase: 86 U/L (ref 38–126)
Anion gap: 11 (ref 5–15)
BUN: 19 mg/dL (ref 8–23)
CO2: 25 mmol/L (ref 22–32)
Calcium: 10.3 mg/dL (ref 8.9–10.3)
Chloride: 108 mmol/L (ref 98–111)
Creatinine: 1.27 mg/dL — ABNORMAL HIGH (ref 0.44–1.00)
GFR, Estimated: 46 mL/min — ABNORMAL LOW (ref >60)
Glucose, Bld: 112 mg/dL — ABNORMAL HIGH (ref 70–99)
Potassium: 4.8 mmol/L (ref 3.5–5.1)
Sodium: 144 mmol/L (ref 135–145)
Total Bilirubin: 0.7 mg/dL (ref 0.0–1.2)
Total Protein: 6.9 g/dL (ref 6.5–8.1)

## 2024-02-29 LAB — FERRITIN: Ferritin: 26 ng/mL (ref 11–307)

## 2024-02-29 LAB — RETICULOCYTES
Immature Retic Fract: 25.5 % — ABNORMAL HIGH (ref 2.3–15.9)
RBC.: 4.97 MIL/uL (ref 3.87–5.11)
Retic Count, Absolute: 141.6 K/uL (ref 19.0–186.0)
Retic Ct Pct: 2.9 % (ref 0.4–3.1)

## 2024-02-29 LAB — IRON AND IRON BINDING CAPACITY (CC-WL,HP ONLY)
Iron: 77 ug/dL (ref 28–170)
Saturation Ratios: 15 % (ref 10.4–31.8)
TIBC: 511 ug/dL — ABNORMAL HIGH (ref 250–450)
UIBC: 434 ug/dL

## 2024-02-29 LAB — SAVE SMEAR(SSMR), FOR PROVIDER SLIDE REVIEW

## 2024-02-29 LAB — LACTATE DEHYDROGENASE: LDH: 491 U/L — ABNORMAL HIGH (ref 98–192)

## 2024-02-29 MED ORDER — TRAZODONE HCL 50 MG PO TABS
25.0000 mg | ORAL_TABLET | Freq: Every day | ORAL | 2 refills | Status: AC
Start: 2024-02-29 — End: ?

## 2024-02-29 NOTE — Progress Notes (Unsigned)
 Hematology and Oncology Follow Up Visit  [name] [id number] 1955-08-25 68 y.o. 03/02/2024   Principle Diagnosis:  Polycythemia vera, JAK 2 positive    Current Therapy:        Phlebotomy as indicated to maintain Hct < 45%    Aspirin  81 mg p.o. daily    Jakifi 5 mg po BID -- started on 11/28/2023 - decreased to 5 mg PO daily on 01/25/2024   Interim History:  [name] is here today with her husband for follow-up.  At her last visit in June her Jakifi dose was lowered due to fatigue and cytopenia. Hgb was down to 10.5, WBC count 3.9 and platelets 225.  Fatigue has improved on the dose adjusted regimen of Jakifi.  No falls or syncope reported.   No fever, chills, n/v, cough, rash, SOB, chest pain, abdominal pain or changes in bowel or bladder habits.  She has not noted any obvious blood loss. No abnormal bruising, no petechiae.  No falls or syncope reported.  Appetite and hydration are good.  Wt Readings from Last 3 Encounters:  02/29/24 225 lb 6.4 oz (102.2 kg)  02/18/24 229 lb (103.9 kg)  01/25/24 226 lb 6.4 oz (102.7 kg)   ECOG Performance Status: 1 - Symptomatic but completely ambulatory  Medications:  Allergies as of 02/29/2024       Reactions   Ambien  [zolpidem ] Other (See Comments)   Sleep walking   Percocet [oxycodone -acetaminophen ] Other (See Comments)   SYNCOPE TACHYCARDIA   Shrimp [shellfish Allergy] Anaphylaxis, Itching, Nausea And Vomiting, Swelling   Hydrocodone -acetaminophen  Nausea And Vomiting, Other (See Comments)   Dizziness, also   Lipitor [atorvastatin ] Other (See Comments)   Ear pain/fullness in ear as if I were under water    Saxenda  [liraglutide  -weight Management] Hives, Itching, Other (See Comments)   Hives/itching/site reaction   Adhesive [tape] Rash, Other (See Comments)   EKG leads and certain bandages break out the skin   Belviq Xr [lorcaserin  Hcl Er] Other (See Comments)   Gained weight    Ferrous Sulfate  Nausea Only   Morphine  And  Codeine Nausea And Vomiting        Medication List        Accurate as of February 29, 2024 11:59 PM. If you have any questions, ask your nurse or doctor.          acetaminophen  325 MG tablet Commonly known as: TYLENOL  Take 325-650 mg by mouth every 6 (six) hours as needed for mild pain or headache.   albuterol  108 (90 Base) MCG/ACT inhaler Commonly known as: VENTOLIN  HFA Inhale 2 puffs into the lungs every 6 (six) hours as needed for wheezing or shortness of breath.   Aspirin  81 MG Caps Take 81 mg by mouth daily at 6 (six) AM.   B-12 PO Take 1 tablet by mouth daily.   calcium  carbonate 500 MG chewable tablet Commonly known as: TUMS - dosed in mg elemental calcium  Chew 1 tablet by mouth as needed for indigestion or heartburn.   cetirizine  5 MG tablet Commonly known as: ZYRTEC  Take 10 mg by mouth daily.   cholecalciferol  25 MCG (1000 UNIT) tablet Commonly known as: VITAMIN D3 Take 1,000 Units by mouth daily.   citalopram  20 MG tablet Commonly known as: CELEXA  TAKE 1 TABLET DAILY What changed:  how much to take how to take this when to take this additional instructions   levothyroxine  125 MCG tablet Commonly known as: SYNTHROID  TAKE 1 TABLET BY MOUTH EVERY DAY BEFORE  BREAKFAST   lisinopril  5 MG tablet Commonly known as: ZESTRIL  TAKE 1 TABLET (5 MG TOTAL) BY MOUTH DAILY.   LUBRICATING EYE DROPS OP Place 1 drop into both eyes as needed (dry eyes).   methocarbamol  500 MG tablet Commonly known as: ROBAXIN  TAKE 1 TABLET BY MOUTH THREE TIMES A DAY   metoprolol  succinate 25 MG 24 hr tablet Commonly known as: TOPROL -XL TAKE 1 TABLET (25 MG TOTAL) BY MOUTH DAILY.   pantoprazole  40 MG tablet Commonly known as: PROTONIX  TAKE 1 TABLET BY MOUTH EVERY DAY   promethazine  25 MG tablet Commonly known as: PHENERGAN  TAKE 1 TABLET (25 MG TOTAL) BY MOUTH AS NEEDED.   rosuvastatin  10 MG tablet Commonly known as: CRESTOR  TAKE 1 TABLET BY MOUTH EVERY DAY    ruxolitinib  phosphate 5 MG tablet Commonly known as: JAKAFI  Take 1 tablet (5 mg total) by mouth 2 (two) times daily.   traMADol  50 MG tablet Commonly known as: ULTRAM  Take 50 mg by mouth every 6 (six) hours as needed.   traZODone  50 MG tablet Commonly known as: DESYREL  Take 0.5-1 tablets (25-50 mg total) by mouth at bedtime.        Allergies:  Allergies  Allergen Reactions   Ambien  [Zolpidem ] Other (See Comments)    Sleep walking   Percocet [Oxycodone -Acetaminophen ] Other (See Comments)    SYNCOPE TACHYCARDIA   Shrimp [Shellfish Allergy] Anaphylaxis, Itching, Nausea And Vomiting and Swelling   Hydrocodone -Acetaminophen  Nausea And Vomiting and Other (See Comments)    Dizziness, also   Lipitor [Atorvastatin ] Other (See Comments)    Ear pain/fullness in ear as if I were under water    Saxenda  [Liraglutide  -Weight Management] Hives, Itching and Other (See Comments)    Hives/itching/site reaction   Adhesive [Tape] Rash and Other (See Comments)    EKG leads and certain bandages break out the skin   Belviq Xr [Lorcaserin  Hcl Er] Other (See Comments)    Gained weight    Ferrous Sulfate  Nausea Only   Morphine  And Codeine Nausea And Vomiting    Past Medical History, Surgical history, Social history, and Family History were reviewed and updated.  Review of Systems: All other 10 point review of systems is negative.   Physical Exam:  height is 5' 4 (1.626 m) and weight is 225 lb 6.4 oz (102.2 kg). Her oral temperature is 97.9 F (36.6 C). Her blood pressure is 142/48 (abnormal) and her pulse is 49 (abnormal). Her respiration is 18 and oxygen saturation is 98%.   Wt Readings from Last 3 Encounters:  02/29/24 225 lb 6.4 oz (102.2 kg)  02/18/24 229 lb (103.9 kg)  01/25/24 226 lb 6.4 oz (102.7 kg)    Ocular: Sclerae unicteric, pupils equal, round and reactive to light Ear-nose-throat: Oropharynx clear, dentition fair Lymphatic: No cervical or supraclavicular  adenopathy Lungs no rales or rhonchi, good excursion bilaterally Heart regular rate and rhythm, no murmur appreciated Abd soft, distended without palpable fluid wave, nontender, positive bowel sounds MSK no focal spinal tenderness, no joint edema Neuro: non-focal, well-oriented, appropriate affect  Lab Results  Component Value Date   WBC 6.0 02/29/2024   HGB 11.5 (L) 02/29/2024   HCT 37.4 02/29/2024   MCV 74.7 (L) 02/29/2024   PLT 366 02/29/2024   Lab Results  Component Value Date   FERRITIN 26 02/29/2024   IRON  77 02/29/2024   TIBC 511 (H) 02/29/2024   UIBC 434 02/29/2024   IRONPCTSAT 15 02/29/2024   Lab Results  Component Value Date  RETICCTPCT 2.9 02/29/2024   RBC 5.01 02/29/2024   RBC 4.97 02/29/2024   No results found for: KPAFRELGTCHN, LAMBDASER, KAPLAMBRATIO No results found for: IGGSERUM, IGA, IGMSERUM No results found for: STEPHANY CARLOTA BENSON MARKEL EARLA JOANNIE DOC VICK, SPEI   Chemistry      Component Value Date/Time   NA 144 02/29/2024 1142   K 4.8 02/29/2024 1142   CL 108 02/29/2024 1142   CO2 25 02/29/2024 1142   BUN 19 02/29/2024 1142   CREATININE 1.27 (H) 02/29/2024 1142   CREATININE 1.18 (H) 12/11/2021 1221      Component Value Date/Time   CALCIUM  10.3 02/29/2024 1142   ALKPHOS 86 02/29/2024 1142   AST 36 02/29/2024 1142   ALT 38 02/29/2024 1142   BILITOT 0.7 02/29/2024 1142     Encounter Diagnoses  Name Primary?   Polycythemia vera (HCC) Yes   Iron  deficiency anemia due to chronic blood loss     Impression and Plan: Ms. Pha is a very pleasant 68 yo caucasian female with polycythemia, JAK 2 positive. She is now on Jakifi PO daily.   CBC looks improved with well maintained platelet levels. Fatigue has improved some.  She had an abdominal US  on 11/24/2023 which showed increased splenomegaly. Repeat in 6-8 months recommended.  I have discussed her rising LDH with Dr. Timmy. Review of her  blood smear today shows that she may benefit from a BMB due to apparent increased cellular proliferation however this could be due to recent decreased Jakifi dose. We will monitor closely.   RTC 1 month MD/ APP, labs (CBC w/, CMP, LDH, ferritin, iron , retic, smear)  Lauraine CHRISTELLA Dais, PA-C 7/24/20258:50 AM

## 2024-03-02 ENCOUNTER — Other Ambulatory Visit: Payer: Self-pay | Admitting: Physician Assistant

## 2024-03-02 ENCOUNTER — Encounter: Payer: Self-pay | Admitting: Family

## 2024-03-02 DIAGNOSIS — I493 Ventricular premature depolarization: Secondary | ICD-10-CM

## 2024-03-04 ENCOUNTER — Other Ambulatory Visit: Payer: Self-pay | Admitting: Physician Assistant

## 2024-03-04 DIAGNOSIS — E039 Hypothyroidism, unspecified: Secondary | ICD-10-CM

## 2024-04-03 ENCOUNTER — Encounter: Payer: Self-pay | Admitting: Hematology & Oncology

## 2024-04-03 ENCOUNTER — Inpatient Hospital Stay (HOSPITAL_BASED_OUTPATIENT_CLINIC_OR_DEPARTMENT_OTHER): Admitting: Hematology & Oncology

## 2024-04-03 ENCOUNTER — Other Ambulatory Visit: Payer: Self-pay

## 2024-04-03 ENCOUNTER — Inpatient Hospital Stay: Attending: Hematology & Oncology

## 2024-04-03 VITALS — BP 129/51 | HR 46 | Temp 97.6°F | Resp 16 | Ht 64.0 in | Wt 227.0 lb

## 2024-04-03 DIAGNOSIS — Z79890 Hormone replacement therapy (postmenopausal): Secondary | ICD-10-CM | POA: Insufficient documentation

## 2024-04-03 DIAGNOSIS — D45 Polycythemia vera: Secondary | ICD-10-CM

## 2024-04-03 DIAGNOSIS — D500 Iron deficiency anemia secondary to blood loss (chronic): Secondary | ICD-10-CM

## 2024-04-03 DIAGNOSIS — Z79899 Other long term (current) drug therapy: Secondary | ICD-10-CM | POA: Diagnosis not present

## 2024-04-03 DIAGNOSIS — Z7982 Long term (current) use of aspirin: Secondary | ICD-10-CM | POA: Insufficient documentation

## 2024-04-03 LAB — RETICULOCYTES
Immature Retic Fract: 24.5 % — ABNORMAL HIGH (ref 2.3–15.9)
RBC.: 5.15 MIL/uL — ABNORMAL HIGH (ref 3.87–5.11)
Retic Count, Absolute: 104.5 K/uL (ref 19.0–186.0)
Retic Ct Pct: 2 % (ref 0.4–3.1)

## 2024-04-03 LAB — LACTATE DEHYDROGENASE: LDH: 471 U/L — ABNORMAL HIGH (ref 98–192)

## 2024-04-03 LAB — CBC WITH DIFFERENTIAL (CANCER CENTER ONLY)
Abs Immature Granulocytes: 0.05 K/uL (ref 0.00–0.07)
Basophils Absolute: 0.1 K/uL (ref 0.0–0.1)
Basophils Relative: 1 %
Eosinophils Absolute: 0.3 K/uL (ref 0.0–0.5)
Eosinophils Relative: 4 %
HCT: 40.6 % (ref 36.0–46.0)
Hemoglobin: 12.7 g/dL (ref 12.0–15.0)
Immature Granulocytes: 1 %
Lymphocytes Relative: 26 %
Lymphs Abs: 1.8 K/uL (ref 0.7–4.0)
MCH: 24.6 pg — ABNORMAL LOW (ref 26.0–34.0)
MCHC: 31.3 g/dL (ref 30.0–36.0)
MCV: 78.7 fL — ABNORMAL LOW (ref 80.0–100.0)
Monocytes Absolute: 0.5 K/uL (ref 0.1–1.0)
Monocytes Relative: 8 %
Neutro Abs: 4.4 K/uL (ref 1.7–7.7)
Neutrophils Relative %: 60 %
Platelet Count: 355 K/uL (ref 150–400)
RBC: 5.16 MIL/uL — ABNORMAL HIGH (ref 3.87–5.11)
RDW: 22.8 % — ABNORMAL HIGH (ref 11.5–15.5)
WBC Count: 7.1 K/uL (ref 4.0–10.5)
nRBC: 0.4 % — ABNORMAL HIGH (ref 0.0–0.2)

## 2024-04-03 LAB — CMP (CANCER CENTER ONLY)
ALT: 40 U/L (ref 0–44)
AST: 35 U/L (ref 15–41)
Albumin: 4.8 g/dL (ref 3.5–5.0)
Alkaline Phosphatase: 83 U/L (ref 38–126)
Anion gap: 9 (ref 5–15)
BUN: 17 mg/dL (ref 8–23)
CO2: 28 mmol/L (ref 22–32)
Calcium: 10.2 mg/dL (ref 8.9–10.3)
Chloride: 104 mmol/L (ref 98–111)
Creatinine: 1.24 mg/dL — ABNORMAL HIGH (ref 0.44–1.00)
GFR, Estimated: 47 mL/min — ABNORMAL LOW (ref >60)
Glucose, Bld: 107 mg/dL — ABNORMAL HIGH (ref 70–99)
Potassium: 4.8 mmol/L (ref 3.5–5.1)
Sodium: 141 mmol/L (ref 135–145)
Total Bilirubin: 0.6 mg/dL (ref 0.0–1.2)
Total Protein: 7 g/dL (ref 6.5–8.1)

## 2024-04-03 LAB — FERRITIN: Ferritin: 16 ng/mL (ref 11–307)

## 2024-04-03 LAB — SAVE SMEAR(SSMR), FOR PROVIDER SLIDE REVIEW

## 2024-04-03 LAB — IRON AND IRON BINDING CAPACITY (CC-WL,HP ONLY)
Iron: 50 ug/dL (ref 28–170)
Saturation Ratios: 9 % — ABNORMAL LOW (ref 10.4–31.8)
TIBC: 566 ug/dL — ABNORMAL HIGH (ref 250–450)
UIBC: 516 ug/dL

## 2024-04-03 NOTE — Progress Notes (Signed)
 Hematology and Oncology Follow Up Visit  HESSIE VARONE [id number] [birth year] 68 y.o. 04/03/2024   Principle Diagnosis:  Polycythemia vera, JAK 2 positive    Current Therapy:        Phlebotomy as indicated to maintain Hct < 45%    Aspirin  81 mg p.o. daily    Jakifi 5 mg po BID -- started on 11/28/2023 - decreased to 5 mg PO daily on 01/25/2024   Interim History:  Ms. Veloso is here today with her husband for follow-up.  As always, there is a doing things around the house.  She is feeling well.  She really has had no complaints.  She is on the Jakafi  once a day which is nice.  She has had no issues with nausea or vomiting.  She has had no problems with her bowels or bladder.  There is been no issues with bleeding.  She has had no fever.  She has had no cough or shortness of breath.  There has been no issues with headache.  She has had no rashes.  There has been no leg swelling.  Overall, I would have to say that her performance status is probably ECOG 0.   .  Wt Readings from Last 3 Encounters:  04/03/24 227 lb (103 kg)  02/29/24 225 lb 6.4 oz (102.2 kg)  02/18/24 229 lb (103.9 kg)     Medications:  Allergies as of 04/03/2024       Reactions   Ambien  [zolpidem ] Other (See Comments)   Sleep walking   Percocet [oxycodone -acetaminophen ] Other (See Comments)   SYNCOPE TACHYCARDIA   Shrimp [shellfish Allergy] Anaphylaxis, Itching, Nausea And Vomiting, Swelling   Hydrocodone -acetaminophen  Nausea And Vomiting, Other (See Comments)   Dizziness, also   Lipitor [atorvastatin ] Other (See Comments)   Ear pain/fullness in ear as if I were under water    Saxenda  [liraglutide  -weight Management] Hives, Itching, Other (See Comments)   Hives/itching/site reaction   Adhesive [tape] Rash, Other (See Comments)   EKG leads and certain bandages break out the skin   Belviq Xr [lorcaserin  Hcl Er] Other (See Comments)   Gained weight    Ferrous Sulfate  Nausea Only   Morphine  And Codeine  Nausea And Vomiting        Medication List        Accurate as of April 03, 2024  1:08 PM. If you have any questions, ask your nurse or doctor.          acetaminophen  325 MG tablet Commonly known as: TYLENOL  Take 325-650 mg by mouth every 6 (six) hours as needed for mild pain or headache.   albuterol  108 (90 Base) MCG/ACT inhaler Commonly known as: VENTOLIN  HFA Inhale 2 puffs into the lungs every 6 (six) hours as needed for wheezing or shortness of breath.   Aspirin  81 MG Caps Take 81 mg by mouth daily at 6 (six) AM.   B-12 PO Take 1 tablet by mouth daily.   calcium  carbonate 500 MG chewable tablet Commonly known as: TUMS - dosed in mg elemental calcium  Chew 1 tablet by mouth as needed for indigestion or heartburn.   cetirizine  5 MG tablet Commonly known as: ZYRTEC  Take 10 mg by mouth daily.   cholecalciferol  25 MCG (1000 UNIT) tablet Commonly known as: VITAMIN D3 Take 1,000 Units by mouth daily.   citalopram  20 MG tablet Commonly known as: CELEXA  TAKE 1 TABLET DAILY What changed:  how much to take how to take this when to take this additional  instructions   levothyroxine  125 MCG tablet Commonly known as: SYNTHROID  TAKE 1 TABLET BY MOUTH EVERY DAY BEFORE BREAKFAST   lisinopril  5 MG tablet Commonly known as: ZESTRIL  TAKE 1 TABLET (5 MG TOTAL) BY MOUTH DAILY.   LUBRICATING EYE DROPS OP Place 1 drop into both eyes as needed (dry eyes).   methocarbamol  500 MG tablet Commonly known as: ROBAXIN  TAKE 1 TABLET BY MOUTH THREE TIMES A DAY   metoprolol  succinate 25 MG 24 hr tablet Commonly known as: TOPROL -XL TAKE 1 TABLET (25 MG TOTAL) BY MOUTH DAILY.   pantoprazole  40 MG tablet Commonly known as: PROTONIX  TAKE 1 TABLET BY MOUTH EVERY DAY   promethazine  25 MG tablet Commonly known as: PHENERGAN  TAKE 1 TABLET (25 MG TOTAL) BY MOUTH AS NEEDED.   rosuvastatin  10 MG tablet Commonly known as: CRESTOR  TAKE 1 TABLET BY MOUTH EVERY DAY   ruxolitinib   phosphate 5 MG tablet Commonly known as: JAKAFI  Take 1 tablet (5 mg total) by mouth 2 (two) times daily.   traMADol  50 MG tablet Commonly known as: ULTRAM  Take 50 mg by mouth every 6 (six) hours as needed.   traZODone  50 MG tablet Commonly known as: DESYREL  Take 0.5-1 tablets (25-50 mg total) by mouth at bedtime.        Allergies:  Allergies  Allergen Reactions   Ambien  [Zolpidem ] Other (See Comments)    Sleep walking   Percocet [Oxycodone -Acetaminophen ] Other (See Comments)    SYNCOPE TACHYCARDIA   Shrimp [Shellfish Allergy] Anaphylaxis, Itching, Nausea And Vomiting and Swelling   Hydrocodone -Acetaminophen  Nausea And Vomiting and Other (See Comments)    Dizziness, also   Lipitor [Atorvastatin ] Other (See Comments)    Ear pain/fullness in ear as if I were under water    Saxenda  [Liraglutide  -Weight Management] Hives, Itching and Other (See Comments)    Hives/itching/site reaction   Adhesive [Tape] Rash and Other (See Comments)    EKG leads and certain bandages break out the skin   Belviq Xr [Lorcaserin  Hcl Er] Other (See Comments)    Gained weight    Ferrous Sulfate  Nausea Only   Morphine  And Codeine Nausea And Vomiting    Past Medical History, Surgical history, Social history, and Family History were reviewed and updated.  Review of Systems: All other 10 point review of systems is negative.   Physical Exam:  height is 5' 4 (1.626 m) and weight is 227 lb (103 kg). Her oral temperature is 97.6 F (36.4 C). Her blood pressure is 129/51 (abnormal) and her pulse is 46 (abnormal). Her respiration is 16 and oxygen saturation is 98%.   Wt Readings from Last 3 Encounters:  04/03/24 227 lb (103 kg)  02/29/24 225 lb 6.4 oz (102.2 kg)  02/18/24 229 lb (103.9 kg)    Ocular: Sclerae unicteric, pupils equal, round and reactive to light Ear-nose-throat: Oropharynx clear, dentition fair Lymphatic: No cervical or supraclavicular adenopathy Lungs no rales or rhonchi, good  excursion bilaterally Heart regular rate and rhythm, no murmur appreciated Abd soft, distended without palpable fluid wave, nontender, positive bowel sounds MSK no focal spinal tenderness, no joint edema Neuro: non-focal, well-oriented, appropriate affect  Lab Results  Component Value Date   WBC 6.0 02/29/2024   HGB 11.5 (L) 02/29/2024   HCT 37.4 02/29/2024   MCV 74.7 (L) 02/29/2024   PLT 366 02/29/2024   Lab Results  Component Value Date   FERRITIN 26 02/29/2024   IRON  77 02/29/2024   TIBC 511 (H) 02/29/2024   UIBC 434 02/29/2024  IRONPCTSAT 15 02/29/2024   Lab Results  Component Value Date   RETICCTPCT 2.0 04/03/2024   RBC 5.15 (H) 04/03/2024   No results found for: KPAFRELGTCHN, LAMBDASER, KAPLAMBRATIO No results found for: IGGSERUM, IGA, IGMSERUM No results found for: STEPHANY CARLOTA BENSON MARKEL EARLA JOANNIE DOC VICK, SPEI   Chemistry      Component Value Date/Time   NA 144 02/29/2024 1142   K 4.8 02/29/2024 1142   CL 108 02/29/2024 1142   CO2 25 02/29/2024 1142   BUN 19 02/29/2024 1142   CREATININE 1.27 (H) 02/29/2024 1142   CREATININE 1.18 (H) 12/11/2021 1221      Component Value Date/Time   CALCIUM  10.3 02/29/2024 1142   ALKPHOS 86 02/29/2024 1142   AST 36 02/29/2024 1142   ALT 38 02/29/2024 1142   BILITOT 0.7 02/29/2024 1142       Impression and Plan: Ms. Ivins is a very pleasant 68 yo caucasian female with polycythemia, JAK 2 positive. She is now on Jakifi PO daily.   Intermittently was pretty stable.  I am very happy for her.  Things really are going quite nicely.  For right now, we will plan to see her back in a couple months.  We will see about trying to get her through the holiday season  without having to see her.  Maude JONELLE Crease, MD 8/25/20251:08 PM

## 2024-04-11 ENCOUNTER — Encounter: Payer: Self-pay | Admitting: Physician Assistant

## 2024-04-11 ENCOUNTER — Ambulatory Visit (INDEPENDENT_AMBULATORY_CARE_PROVIDER_SITE_OTHER): Admitting: Physician Assistant

## 2024-04-11 VITALS — BP 150/80 | HR 86 | Temp 99.4°F | Ht 64.0 in | Wt 222.0 lb

## 2024-04-11 DIAGNOSIS — R051 Acute cough: Secondary | ICD-10-CM | POA: Diagnosis not present

## 2024-04-11 DIAGNOSIS — J069 Acute upper respiratory infection, unspecified: Secondary | ICD-10-CM

## 2024-04-11 DIAGNOSIS — J029 Acute pharyngitis, unspecified: Secondary | ICD-10-CM | POA: Diagnosis not present

## 2024-04-11 DIAGNOSIS — R6889 Other general symptoms and signs: Secondary | ICD-10-CM

## 2024-04-11 LAB — POC SOFIA 2 FLU + SARS ANTIGEN FIA
Influenza A, POC: NEGATIVE
Influenza B, POC: NEGATIVE
SARS Coronavirus 2 Ag: NEGATIVE

## 2024-04-11 LAB — POCT RAPID STREP A (OFFICE): Rapid Strep A Screen: NEGATIVE

## 2024-04-11 MED ORDER — PROMETHAZINE-DM 6.25-15 MG/5ML PO SYRP: 5.0000 mL | ORAL_SOLUTION | Freq: Four times a day (QID) | ORAL | 0 refills | Status: DC | PRN

## 2024-04-11 MED ORDER — PREDNISONE 20 MG PO TABS: 20.0000 mg | ORAL_TABLET | Freq: Two times a day (BID) | ORAL | 0 refills | Status: DC

## 2024-04-11 MED ORDER — METHYLPREDNISOLONE SODIUM SUCC 125 MG IJ SOLR
125.0000 mg | Freq: Once | INTRAMUSCULAR | Status: AC
  Administered 2024-04-11: 125 mg via INTRAMUSCULAR

## 2024-04-11 MED ORDER — AMOXICILLIN-POT CLAVULANATE 875-125 MG PO TABS: 1.0000 | ORAL_TABLET | Freq: Two times a day (BID) | ORAL | 0 refills | Status: DC

## 2024-04-11 NOTE — Patient Instructions (Signed)
 Start prednisone  and cough syrup Consider throat spray for sore throat Ok to continue tylenol  for aches and pains Start augmentin  if not improving or symptoms worsening  Upper Respiratory Infection, Adult An upper respiratory infection (URI) affects the nose, throat, and upper airways that lead to the lungs. The most common type of URI is often called the common cold. URIs usually get better on their own, without medical treatment. What are the causes? A URI is caused by a germ (virus). You may catch these germs by: Breathing in droplets from an infected person's cough or sneeze. Touching something that has the germ on it (is contaminated) and then touching your mouth, nose, or eyes. What increases the risk? You are more likely to get a URI if: You are very young or very old. You have close contact with others, such as at work, school, or a health care facility. You smoke. You have long-term (chronic) heart or lung disease. You have a weakened disease-fighting system (immune system). You have nasal allergies or asthma. You have a lot of stress. You have poor nutrition. What are the signs or symptoms? Runny or stuffy (congested) nose. Cough. Sneezing. Sore throat. Headache. Feeling tired (fatigue). Fever. Not wanting to eat as much as usual. Pain in your forehead, behind your eyes, and over your cheekbones (sinus pain). Muscle aches. Redness or irritation of the eyes. Pressure in the ears or face. How is this treated? URIs usually get better on their own within 7-10 days. Medicines cannot cure URIs, but your doctor may recommend certain medicines to help relieve symptoms, such as: Over-the-counter cold medicines. Medicines to reduce coughing (cough suppressants). Coughing is a type of defense against infection that helps to clear the nose, throat, windpipe, and lungs (respiratory system). Take these medicines only as told by your doctor. Medicines to lower your fever. Follow  these instructions at home: Activity Rest as needed. If you have a fever, stay home from work or school until your fever is gone, or until your doctor says you may return to work or school. You should stay home until you cannot spread the infection anymore (you are not contagious). Your doctor may have you wear a face mask so you have less risk of spreading the infection. Relieving symptoms Rinse your mouth often with salt water . To make salt water , dissolve -1 tsp (3-6 g) of salt in 1 cup (237 mL) of warm water . Use a cool-mist humidifier to add moisture to the air. This can help you breathe more easily. Eating and drinking  Drink enough fluid to keep your pee (urine) pale yellow. Eat soups and other clear broths. General instructions  Take over-the-counter and prescription medicines only as told by your doctor. Do not smoke or use any products that contain nicotine or tobacco. If you need help quitting, ask your doctor. Avoid being where people are smoking (avoid secondhand smoke). Stay up to date on all your shots (immunizations), and get the flu shot every year. Keep all follow-up visits. How to prevent the spread of infection to others  Wash your hands with soap and water  for at least 20 seconds. If you cannot use soap and water , use hand sanitizer. Avoid touching your mouth, face, eyes, or nose. Cough or sneeze into a tissue or your sleeve or elbow. Do not cough or sneeze into your hand or into the air. Contact a doctor if: You are getting worse, not better. You have any of these: A fever or chills. Brown or red  mucus in your nose. Yellow or brown fluid (discharge)coming from your nose. Pain in your face, especially when you bend forward. Swollen neck glands. Pain when you swallow. White areas in the back of your throat. Get help right away if: You have shortness of breath that gets worse. You have very bad or constant: Headache. Ear pain. Pain in your forehead, behind  your eyes, and over your cheekbones (sinus pain). Chest pain. You have long-lasting (chronic) lung disease along with any of these: Making high-pitched whistling sounds when you breathe, most often when you breathe out (wheezing). Long-lasting cough (more than 14 days). Coughing up blood. A change in your usual mucus. You have a stiff neck. You have changes in your: Vision. Hearing. Thinking. Mood. These symptoms may be an emergency. Get help right away. Call 911. Do not wait to see if the symptoms will go away. Do not drive yourself to the hospital. Summary An upper respiratory infection (URI) is caused by a germ (virus). The most common type of URI is often called the common cold. URIs usually get better within 7-10 days. Take over-the-counter and prescription medicines only as told by your doctor. This information is not intended to replace advice given to you by your health care provider. Make sure you discuss any questions you have with your health care provider. Document Revised: 02/26/2021 Document Reviewed: 02/26/2021 Elsevier Patient Education  2024 [employer].

## 2024-04-11 NOTE — Progress Notes (Signed)
 Acute Office Visit  Subjective:     Patient ID: [name], female    DOB: [birth year], 68 y.o.   MRN: [id number]  Chief Complaint  Patient presents with   Sore Throat    HPI Patient is in today for ST, cough, congestion, fever, fatigue for 3 days. She tested negative for covid and flu at home. Her husband accompanies her and not sick today. Her throat is very sore and hard to swallow. She feels like spit is caught in her throat. She is on medications that leave her immunosuppressed. She has ran fever at home. She is taking OTC cold and cough medications with no benefit.    ROS See HPI.      Objective:    BP (!) 150/80   Pulse 86   Temp 99.4 F (37.4 C) (Oral)   Ht 5' 4 (1.626 m)   Wt 222 lb (100.7 kg)   SpO2 97%   BMI 38.11 kg/m  BP Readings from Last 3 Encounters:  04/11/24 (!) 150/80  04/03/24 (!) 129/51  02/29/24 (!) 142/48   Wt Readings from Last 3 Encounters:  04/11/24 222 lb (100.7 kg)  04/03/24 227 lb (103 kg)  02/29/24 225 lb 6.4 oz (102.2 kg)    .SABRA Results for orders placed or performed in visit on 04/11/24  POCT rapid strep A   Collection Time: 04/11/24 10:48 AM  Result Value Ref Range   Rapid Strep A Screen Negative Negative  POC SOFIA 2 FLU + SARS ANTIGEN FIA   Collection Time: 04/11/24 10:49 AM  Result Value Ref Range   Influenza A, POC Negative Negative   Influenza B, POC Negative Negative   SARS Coronavirus 2 Ag Negative Negative     Physical Exam Constitutional:      Appearance: She is ill-appearing.  HENT:     Head: Normocephalic.     Right Ear: Ear canal normal. No drainage, swelling or tenderness. A middle ear effusion is present. Tympanic membrane is erythematous.     Left Ear: Ear canal normal. No drainage, swelling or tenderness. A middle ear effusion is present. Tympanic membrane is erythematous.     Nose: Congestion present.     Mouth/Throat:     Mouth: Mucous membranes are moist. No oral lesions.     Pharynx: Uvula  midline. Posterior oropharyngeal erythema and uvula swelling present. No pharyngeal swelling or oropharyngeal exudate.     Tonsils: No tonsillar exudate.     Comments: Tonsil absent.  Eyes:     Conjunctiva/sclera: Conjunctivae normal.  Cardiovascular:     Rate and Rhythm: Normal rate and regular rhythm.     Heart sounds: Murmur heard.  Lymphadenopathy:     Cervical: Cervical adenopathy present.  Neurological:     General: No focal deficit present.  Psychiatric:        Mood and Affect: Mood normal.          Assessment & Plan:  SABRASABRAOthelia was seen today for sore throat.  Diagnoses and all orders for this visit:  Upper respiratory tract infection, unspecified type -     amoxicillin -clavulanate (AUGMENTIN ) 875-125 MG tablet; Take 1 tablet by mouth 2 (two) times daily. -     predniSONE  (DELTASONE ) 20 MG tablet; Take 1 tablet (20 mg total) by mouth 2 (two) times daily with a meal. -     promethazine -dextromethorphan (PROMETHAZINE -DM) 6.25-15 MG/5ML syrup; Take 5 mLs by mouth 4 (four) times daily as needed.  Flu-like symptoms -  POC SOFIA 2 FLU + SARS ANTIGEN FIA -     POCT rapid strep A  Acute cough -     predniSONE  (DELTASONE ) 20 MG tablet; Take 1 tablet (20 mg total) by mouth 2 (two) times daily with a meal. -     DG Chest 2 View; Future -     methylPREDNISolone  sodium succinate (SOLU-MEDROL ) 125 mg/2 mL injection 125 mg  Sore throat -     predniSONE  (DELTASONE ) 20 MG tablet; Take 1 tablet (20 mg total) by mouth 2 (two) times daily with a meal.  Covid/flu/strep negative Likely some other viral etiology Start prednisone  tomorrow Solumedrol given IM in office today Rest and hydrate Discussed tylenol  and throat spray for sore throat CXR if not improving ordered  Promethazine  cough syrup to use as needed for cough Augmentin  if symptoms not improving due to patients immunocompromised state in 48 to 72 hours   Jeidy Hoerner, PA-C

## 2024-04-12 ENCOUNTER — Ambulatory Visit (INDEPENDENT_AMBULATORY_CARE_PROVIDER_SITE_OTHER)

## 2024-04-12 ENCOUNTER — Encounter: Payer: Self-pay | Admitting: Physician Assistant

## 2024-04-12 DIAGNOSIS — R509 Fever, unspecified: Secondary | ICD-10-CM

## 2024-04-12 DIAGNOSIS — J069 Acute upper respiratory infection, unspecified: Secondary | ICD-10-CM

## 2024-04-12 DIAGNOSIS — R051 Acute cough: Secondary | ICD-10-CM | POA: Diagnosis not present

## 2024-04-12 DIAGNOSIS — J209 Acute bronchitis, unspecified: Secondary | ICD-10-CM

## 2024-04-13 ENCOUNTER — Ambulatory Visit: Payer: Self-pay | Admitting: Physician Assistant

## 2024-04-13 NOTE — Progress Notes (Signed)
 No acute changes. No pneumonia.

## 2024-05-12 ENCOUNTER — Ambulatory Visit (INDEPENDENT_AMBULATORY_CARE_PROVIDER_SITE_OTHER)

## 2024-05-12 ENCOUNTER — Ambulatory Visit: Payer: Self-pay | Admitting: Physician Assistant

## 2024-05-12 DIAGNOSIS — R059 Cough, unspecified: Secondary | ICD-10-CM

## 2024-05-12 DIAGNOSIS — J209 Acute bronchitis, unspecified: Secondary | ICD-10-CM

## 2024-05-12 DIAGNOSIS — R0989 Other specified symptoms and signs involving the circulatory and respiratory systems: Secondary | ICD-10-CM

## 2024-05-12 MED ORDER — LEVOFLOXACIN 750 MG PO TABS: 750.0000 mg | ORAL_TABLET | Freq: Every day | ORAL | 0 refills | Status: DC

## 2024-05-12 MED ORDER — PROMETHAZINE-DM 6.25-15 MG/5ML PO SYRP: 5.0000 mL | ORAL_SOLUTION | Freq: Four times a day (QID) | ORAL | 0 refills | Status: DC | PRN

## 2024-05-12 NOTE — Progress Notes (Signed)
 Right middle lobe pneumonia. Sent levaquin . Need repeat in cXR in 3-4 weeks.

## 2024-05-12 NOTE — Addendum Note (Signed)
 Addended by: ANTONIETTE VERMELL CROME on: 05/12/2024 12:38 PM   Modules accepted: Orders

## 2024-05-30 ENCOUNTER — Other Ambulatory Visit: Payer: Self-pay | Admitting: Physician Assistant

## 2024-05-30 DIAGNOSIS — I493 Ventricular premature depolarization: Secondary | ICD-10-CM

## 2024-06-04 ENCOUNTER — Encounter: Payer: Self-pay | Admitting: Physician Assistant

## 2024-06-04 DIAGNOSIS — J209 Acute bronchitis, unspecified: Secondary | ICD-10-CM

## 2024-06-05 ENCOUNTER — Inpatient Hospital Stay (HOSPITAL_BASED_OUTPATIENT_CLINIC_OR_DEPARTMENT_OTHER): Admitting: Hematology & Oncology

## 2024-06-05 ENCOUNTER — Inpatient Hospital Stay: Attending: Hematology & Oncology

## 2024-06-05 ENCOUNTER — Encounter: Payer: Self-pay | Admitting: Hematology & Oncology

## 2024-06-05 VITALS — BP 133/69 | HR 54 | Temp 98.0°F | Resp 20 | Ht 64.0 in | Wt 229.0 lb

## 2024-06-05 DIAGNOSIS — D45 Polycythemia vera: Secondary | ICD-10-CM

## 2024-06-05 LAB — IRON AND IRON BINDING CAPACITY (CC-WL,HP ONLY)
Iron: 63 ug/dL (ref 28–170)
Saturation Ratios: 12 % (ref 10.4–31.8)
TIBC: 540 ug/dL — ABNORMAL HIGH (ref 250–450)
UIBC: 477 ug/dL

## 2024-06-05 LAB — CBC WITH DIFFERENTIAL (CANCER CENTER ONLY)
Abs Immature Granulocytes: 0.06 K/uL (ref 0.00–0.07)
Basophils Absolute: 0 K/uL (ref 0.0–0.1)
Basophils Relative: 1 %
Eosinophils Absolute: 0.3 K/uL (ref 0.0–0.5)
Eosinophils Relative: 5 %
HCT: 41.1 % (ref 36.0–46.0)
Hemoglobin: 12.8 g/dL (ref 12.0–15.0)
Immature Granulocytes: 1 %
Lymphocytes Relative: 25 %
Lymphs Abs: 1.5 K/uL (ref 0.7–4.0)
MCH: 24.4 pg — ABNORMAL LOW (ref 26.0–34.0)
MCHC: 31.1 g/dL (ref 30.0–36.0)
MCV: 78.4 fL — ABNORMAL LOW (ref 80.0–100.0)
Monocytes Absolute: 0.4 K/uL (ref 0.1–1.0)
Monocytes Relative: 6 %
Neutro Abs: 3.7 K/uL (ref 1.7–7.7)
Neutrophils Relative %: 62 %
Platelet Count: 314 K/uL (ref 150–400)
RBC: 5.24 MIL/uL — ABNORMAL HIGH (ref 3.87–5.11)
RDW: 18 % — ABNORMAL HIGH (ref 11.5–15.5)
WBC Count: 6 K/uL (ref 4.0–10.5)
nRBC: 0.7 % — ABNORMAL HIGH (ref 0.0–0.2)

## 2024-06-05 LAB — CMP (CANCER CENTER ONLY)
ALT: 39 U/L (ref 0–44)
AST: 40 U/L (ref 15–41)
Albumin: 4.7 g/dL (ref 3.5–5.0)
Alkaline Phosphatase: 66 U/L (ref 38–126)
Anion gap: 11 (ref 5–15)
BUN: 15 mg/dL (ref 8–23)
CO2: 26 mmol/L (ref 22–32)
Calcium: 10.1 mg/dL (ref 8.9–10.3)
Chloride: 109 mmol/L (ref 98–111)
Creatinine: 1.33 mg/dL — ABNORMAL HIGH (ref 0.44–1.00)
GFR, Estimated: 43 mL/min — ABNORMAL LOW (ref >60)
Glucose, Bld: 138 mg/dL — ABNORMAL HIGH (ref 70–99)
Potassium: 4.4 mmol/L (ref 3.5–5.1)
Sodium: 145 mmol/L (ref 135–145)
Total Bilirubin: 0.5 mg/dL (ref 0.0–1.2)
Total Protein: 6.9 g/dL (ref 6.5–8.1)

## 2024-06-05 LAB — RETICULOCYTES
Immature Retic Fract: 24.2 % — ABNORMAL HIGH (ref 2.3–15.9)
RBC.: 5.33 MIL/uL — ABNORMAL HIGH (ref 3.87–5.11)
Retic Count, Absolute: 108.7 K/uL (ref 19.0–186.0)
Retic Ct Pct: 2 % (ref 0.4–3.1)

## 2024-06-05 LAB — SAVE SMEAR(SSMR), FOR PROVIDER SLIDE REVIEW

## 2024-06-05 LAB — LACTATE DEHYDROGENASE: LDH: 432 U/L — ABNORMAL HIGH (ref 98–192)

## 2024-06-05 LAB — FERRITIN: Ferritin: 17 ng/mL (ref 11–307)

## 2024-06-05 NOTE — Progress Notes (Signed)
 Hematology and Oncology Follow Up Visit  [name] [id number] [birth year] 68 y.o. 06/05/2024   Principle Diagnosis:  Polycythemia vera, JAK 2 positive    Current Therapy:        Phlebotomy as indicated to maintain Hct < 45%    Aspirin  81 mg p.o. daily    Jakifi 5 mg po BID -- started on 11/28/2023 - decreased to 5 mg PO daily on 01/25/2024   Interim History:  [name] is here today with her husband for follow-up.  She is doing quite well.  She really has had no complaints.  She is doing well on the Jakafi .  She really is on low-dose Jakafi .  We will go ahead and set her up with an ultrasound to see how her spleen looks.  Hopefully, the spleen size will be less.  She has had no problems with nausea or vomiting.  There has been no change in bowel or bladder habits..  She did have COVID.  She was not on any treatment for COVID.  She had pneumonia afterwards.  I think she was on antibiotics for this.  When we last saw her, her ferritin was 16 with an iron  saturation of 9%.  She has had no rashes.  There has been no bleeding.  She has had no mouth sores.  She has had no headache.  Overall, I will say that her performance status is probably ECOG 1.  .  Wt Readings from Last 3 Encounters:  06/05/24 229 lb 0.6 oz (103.9 kg)  04/11/24 222 lb (100.7 kg)  04/03/24 227 lb (103 kg)     Medications:  Allergies as of 06/05/2024       Reactions   Ambien  [zolpidem ] Other (See Comments)   Sleep walking   Percocet [oxycodone -acetaminophen ] Other (See Comments)   SYNCOPE TACHYCARDIA   Shrimp [shellfish Allergy] Anaphylaxis, Itching, Nausea And Vomiting, Swelling   Hydrocodone -acetaminophen  Nausea And Vomiting, Other (See Comments)   Dizziness, also   Lipitor [atorvastatin ] Other (See Comments)   Ear pain/fullness in ear as if I were under water    Saxenda  [liraglutide  -weight Management] Hives, Itching, Other (See Comments)   Hives/itching/site reaction   Adhesive [tape] Rash,  Other (See Comments)   EKG leads and certain bandages break out the skin   Belviq Xr [lorcaserin  Hcl Er] Other (See Comments)   Gained weight    Ferrous Sulfate  Nausea Only   Morphine  And Codeine Nausea And Vomiting        Medication List        Accurate as of June 05, 2024  1:34 PM. If you have any questions, ask your nurse or doctor.          STOP taking these medications    levofloxacin  750 MG tablet Commonly known as: Levaquin  Stopped by: [name]       TAKE these medications    acetaminophen  325 MG tablet Commonly known as: TYLENOL  Take 325-650 mg by mouth every 6 (six) hours as needed for mild pain or headache.   albuterol  108 (90 Base) MCG/ACT inhaler Commonly known as: VENTOLIN  HFA Inhale 2 puffs into the lungs every 6 (six) hours as needed for wheezing or shortness of breath.   Aspirin  81 MG Caps Take 81 mg by mouth daily at 6 (six) AM.   B-12 PO Take 1 tablet by mouth daily.   calcium  carbonate 500 MG chewable tablet Commonly known as: TUMS - dosed in mg elemental calcium  Chew 1 tablet by  mouth as needed for indigestion or heartburn.   cetirizine  5 MG tablet Commonly known as: ZYRTEC  Take 10 mg by mouth daily.   cholecalciferol  25 MCG (1000 UNIT) tablet Commonly known as: VITAMIN D3 Take 1,000 Units by mouth daily.   citalopram  20 MG tablet Commonly known as: CELEXA  TAKE 1 TABLET DAILY What changed:  how much to take how to take this when to take this additional instructions   levothyroxine  125 MCG tablet Commonly known as: SYNTHROID  TAKE 1 TABLET BY MOUTH EVERY DAY BEFORE BREAKFAST   lisinopril  5 MG tablet Commonly known as: ZESTRIL  TAKE 1 TABLET (5 MG TOTAL) BY MOUTH DAILY.   LUBRICATING EYE DROPS OP Place 1 drop into both eyes as needed (dry eyes).   methocarbamol  500 MG tablet Commonly known as: ROBAXIN  TAKE 1 TABLET BY MOUTH THREE TIMES A DAY   metoprolol  succinate 25 MG 24 hr tablet Commonly known as:  TOPROL -XL TAKE 1 TABLET (25 MG TOTAL) BY MOUTH DAILY.   pantoprazole  40 MG tablet Commonly known as: PROTONIX  TAKE 1 TABLET BY MOUTH EVERY DAY   promethazine  25 MG tablet Commonly known as: PHENERGAN  TAKE 1 TABLET (25 MG TOTAL) BY MOUTH AS NEEDED.   promethazine -dextromethorphan 6.25-15 MG/5ML syrup Commonly known as: PROMETHAZINE -DM Take 5 mLs by mouth 4 (four) times daily as needed.   rosuvastatin  10 MG tablet Commonly known as: CRESTOR  TAKE 1 TABLET BY MOUTH EVERY DAY   ruxolitinib  phosphate 5 MG tablet Commonly known as: JAKAFI  Take 1 tablet (5 mg total) by mouth 2 (two) times daily. What changed: when to take this   traZODone  50 MG tablet Commonly known as: DESYREL  Take 0.5-1 tablets (25-50 mg total) by mouth at bedtime.        Allergies:  Allergies  Allergen Reactions   Ambien  [Zolpidem ] Other (See Comments)    Sleep walking   Percocet [Oxycodone -Acetaminophen ] Other (See Comments)    SYNCOPE TACHYCARDIA   Shrimp [Shellfish Allergy] Anaphylaxis, Itching, Nausea And Vomiting and Swelling   Hydrocodone -Acetaminophen  Nausea And Vomiting and Other (See Comments)    Dizziness, also   Lipitor [Atorvastatin ] Other (See Comments)    Ear pain/fullness in ear as if I were under water    Saxenda  [Liraglutide  -Weight Management] Hives, Itching and Other (See Comments)    Hives/itching/site reaction   Adhesive [Tape] Rash and Other (See Comments)    EKG leads and certain bandages break out the skin   Belviq Xr [Lorcaserin  Hcl Er] Other (See Comments)    Gained weight    Ferrous Sulfate  Nausea Only   Morphine  And Codeine Nausea And Vomiting    Past Medical History, Surgical history, Social history, and Family History were reviewed and updated.  Review of Systems: All other 10 point review of systems is negative.   Physical Exam:  height is 5' 4 (1.626 m) and weight is 229 lb 0.6 oz (103.9 kg). Her oral temperature is 98 F (36.7 C). Her blood pressure is 133/69  and her pulse is 54 (abnormal). Her respiration is 20 and oxygen saturation is 98%.   Wt Readings from Last 3 Encounters:  06/05/24 229 lb 0.6 oz (103.9 kg)  04/11/24 222 lb (100.7 kg)  04/03/24 227 lb (103 kg)    Physical Exam Vitals reviewed.  HENT:     Head: Normocephalic and atraumatic.  Eyes:     Pupils: Pupils are equal, round, and reactive to light.  Cardiovascular:     Rate and Rhythm: Normal rate and regular rhythm.  Heart sounds: Normal heart sounds.  Pulmonary:     Effort: Pulmonary effort is normal.     Breath sounds: Normal breath sounds.  Abdominal:     General: Bowel sounds are normal.     Palpations: Abdomen is soft.  Musculoskeletal:        General: No tenderness or deformity. Normal range of motion.     Cervical back: Normal range of motion.  Lymphadenopathy:     Cervical: No cervical adenopathy.  Skin:    General: Skin is warm and dry.     Findings: No erythema or rash.  Neurological:     Mental Status: She is alert and oriented to person, place, and time.  Psychiatric:        Behavior: Behavior normal.        Thought Content: Thought content normal.        Judgment: Judgment normal.      Lab Results  Component Value Date   WBC 6.0 06/05/2024   HGB 12.8 06/05/2024   HCT 41.1 06/05/2024   MCV 78.4 (L) 06/05/2024   PLT 314 06/05/2024   Lab Results  Component Value Date   FERRITIN 16 04/03/2024   IRON  50 04/03/2024   TIBC 566 (H) 04/03/2024   UIBC 516 04/03/2024   IRONPCTSAT 9 (L) 04/03/2024   Lab Results  Component Value Date   RETICCTPCT 2.0 06/05/2024   RBC 5.33 (H) 06/05/2024   RBC 5.24 (H) 06/05/2024   No results found for: KPAFRELGTCHN, LAMBDASER, KAPLAMBRATIO No results found for: IGGSERUM, IGA, IGMSERUM No results found for: STEPHANY CARLOTA BENSON MARKEL EARLA JOANNIE DOC VICK, SPEI   Chemistry      Component Value Date/Time   NA 145 06/05/2024 1220   K 4.4 06/05/2024 1220    CL 109 06/05/2024 1220   CO2 26 06/05/2024 1220   BUN 15 06/05/2024 1220   CREATININE 1.33 (H) 06/05/2024 1220   CREATININE 1.18 (H) 12/11/2021 1221      Component Value Date/Time   CALCIUM  10.1 06/05/2024 1220   ALKPHOS 66 06/05/2024 1220   AST 40 06/05/2024 1220   ALT 39 06/05/2024 1220   BILITOT 0.5 06/05/2024 1220       Impression and Plan: Ms. Kotowski is a very pleasant 68 yo caucasian female with polycythemia, JAK 2 positive. She is now on Jakifi PO daily.   I really think she is doing well.  Her blood counts look great.  We will go ahead and set her up with an ultrasound of her spleen.  Will see about get this set up in about 3 or 4 weeks.  I just am happy that she has tolerated the Jakafi  so well.  Her blood counts certainly are responding well.  We will go ahead and plan to get her back to see us  in another 2 months.  Tanya JONELLE Crease, MD 10/27/20251:34 PM

## 2024-06-08 ENCOUNTER — Ambulatory Visit (INDEPENDENT_AMBULATORY_CARE_PROVIDER_SITE_OTHER)

## 2024-06-08 DIAGNOSIS — R059 Cough, unspecified: Secondary | ICD-10-CM | POA: Diagnosis not present

## 2024-06-08 DIAGNOSIS — J209 Acute bronchitis, unspecified: Secondary | ICD-10-CM

## 2024-06-08 DIAGNOSIS — D45 Polycythemia vera: Secondary | ICD-10-CM | POA: Diagnosis not present

## 2024-06-09 ENCOUNTER — Ambulatory Visit: Payer: Self-pay | Admitting: Physician Assistant

## 2024-06-09 ENCOUNTER — Ambulatory Visit: Payer: Self-pay | Admitting: Hematology & Oncology

## 2024-06-09 DIAGNOSIS — J189 Pneumonia, unspecified organism: Secondary | ICD-10-CM

## 2024-06-09 DIAGNOSIS — R053 Chronic cough: Secondary | ICD-10-CM

## 2024-06-09 NOTE — Progress Notes (Signed)
 Significant improvement and consolidation resolving on xray.  How are you feeling?

## 2024-06-20 ENCOUNTER — Other Ambulatory Visit: Payer: Self-pay | Admitting: Physician Assistant

## 2024-06-20 DIAGNOSIS — J069 Acute upper respiratory infection, unspecified: Secondary | ICD-10-CM

## 2024-06-26 ENCOUNTER — Ambulatory Visit: Payer: Self-pay

## 2024-06-26 NOTE — Telephone Encounter (Signed)
 This RN left VM to call back to discuss symptoms.  Attempt # 1  Copied from CRM [id number]. Topic: Clinical - Red Word Triage >> Jun 26, 2024  8:55 AM Treva T wrote: [facility] that prompted transfer to Nurse Triage: Pt states she was seen in office about 3 weeks ago and treated for pneumonia.  She states symptoms are worsening, including worsening productive cough, with thick colored mucous, and shortness of breath.

## 2024-06-26 NOTE — Telephone Encounter (Signed)
 Left VM to call back to discuss symptoms. Straight to VM. Attempt #2

## 2024-06-27 ENCOUNTER — Telehealth (HOSPITAL_BASED_OUTPATIENT_CLINIC_OR_DEPARTMENT_OTHER): Payer: Self-pay

## 2024-06-27 ENCOUNTER — Ambulatory Visit (HOSPITAL_BASED_OUTPATIENT_CLINIC_OR_DEPARTMENT_OTHER)
Admission: RE | Admit: 2024-06-27 | Discharge: 2024-06-27 | Disposition: A | Discharge status: Home / Self Care | Source: Ambulatory Visit | Attending: Physician Assistant | Admitting: Physician Assistant

## 2024-06-27 DIAGNOSIS — U099 Post covid-19 condition, unspecified: Secondary | ICD-10-CM | POA: Diagnosis present

## 2024-06-27 DIAGNOSIS — J189 Pneumonia, unspecified organism: Secondary | ICD-10-CM | POA: Insufficient documentation

## 2024-06-27 DIAGNOSIS — R053 Chronic cough: Secondary | ICD-10-CM | POA: Insufficient documentation

## 2024-06-27 MED ORDER — IOHEXOL 300 MG/ML  SOLN
75.0000 mL | Freq: Once | INTRAMUSCULAR | Status: AC | PRN
  Administered 2024-06-27: 75 mL via INTRAVENOUS

## 2024-06-27 NOTE — Telephone Encounter (Signed)
 FYI Attempted call to patient. Left a voicemail message requesting a return call.

## 2024-06-27 NOTE — Telephone Encounter (Signed)
 Copied from CRM [id number]. Topic: Clinical - Medical Advice >> Jun 27, 2024 12:21 PM Nathanel BROCKS wrote: Reason for CRM: pt returned call but was closed for lunch please call back.

## 2024-06-27 NOTE — Telephone Encounter (Signed)
 Patient was returning kims call. Asked if she was feeling any better, she said that she's not but jade ordered a chest XR in which she is scheduled to have done this afternoon.

## 2024-06-28 ENCOUNTER — Encounter: Payer: Self-pay | Admitting: Physician Assistant

## 2024-06-28 ENCOUNTER — Ambulatory Visit: Payer: Self-pay | Admitting: Physician Assistant

## 2024-06-28 DIAGNOSIS — R918 Other nonspecific abnormal finding of lung field: Secondary | ICD-10-CM | POA: Insufficient documentation

## 2024-06-28 DIAGNOSIS — K760 Fatty (change of) liver, not elsewhere classified: Secondary | ICD-10-CM | POA: Insufficient documentation

## 2024-06-28 NOTE — Progress Notes (Signed)
 No acute or worrisome findings. Pneumonia has resolved. No pleural effusion. Several tinny nodules.( No follow up needed but could be considered in 12 months if high risk) Some Atelectasis residual that you can use spirometer daily to try to keep lungs open.  Fatty liver.   The cough is either coming from sinus drainage or post inflammatory.

## 2024-07-24 ENCOUNTER — Encounter: Payer: Self-pay | Admitting: Hematology & Oncology

## 2024-07-24 ENCOUNTER — Other Ambulatory Visit: Payer: Self-pay

## 2024-07-24 ENCOUNTER — Inpatient Hospital Stay: Admitting: Hematology & Oncology

## 2024-07-24 ENCOUNTER — Inpatient Hospital Stay: Attending: Hematology & Oncology

## 2024-07-24 VITALS — BP 120/57 | HR 56 | Temp 97.8°F | Resp 20 | Ht 64.0 in | Wt 232.0 lb

## 2024-07-24 DIAGNOSIS — Z79899 Other long term (current) drug therapy: Secondary | ICD-10-CM | POA: Diagnosis not present

## 2024-07-24 DIAGNOSIS — D45 Polycythemia vera: Secondary | ICD-10-CM

## 2024-07-24 DIAGNOSIS — Z7982 Long term (current) use of aspirin: Secondary | ICD-10-CM | POA: Insufficient documentation

## 2024-07-24 DIAGNOSIS — Z79890 Hormone replacement therapy (postmenopausal): Secondary | ICD-10-CM | POA: Diagnosis not present

## 2024-07-24 LAB — CBC WITH DIFFERENTIAL (CANCER CENTER ONLY)
Abs Immature Granulocytes: 0.05 K/uL (ref 0.00–0.07)
Basophils Absolute: 0.1 K/uL (ref 0.0–0.1)
Basophils Relative: 1 %
Eosinophils Absolute: 0.3 K/uL (ref 0.0–0.5)
Eosinophils Relative: 5 %
HCT: 41 % (ref 36.0–46.0)
Hemoglobin: 12.6 g/dL (ref 12.0–15.0)
Immature Granulocytes: 1 %
Lymphocytes Relative: 26 %
Lymphs Abs: 1.8 K/uL (ref 0.7–4.0)
MCH: 24 pg — ABNORMAL LOW (ref 26.0–34.0)
MCHC: 30.7 g/dL (ref 30.0–36.0)
MCV: 78.1 fL — ABNORMAL LOW (ref 80.0–100.0)
Monocytes Absolute: 0.5 K/uL (ref 0.1–1.0)
Monocytes Relative: 8 %
Neutro Abs: 4.2 K/uL (ref 1.7–7.7)
Neutrophils Relative %: 59 %
Platelet Count: 330 K/uL (ref 150–400)
RBC: 5.25 MIL/uL — ABNORMAL HIGH (ref 3.87–5.11)
RDW: 21.1 % — ABNORMAL HIGH (ref 11.5–15.5)
WBC Count: 7 K/uL (ref 4.0–10.5)
nRBC: 0.4 % — ABNORMAL HIGH (ref 0.0–0.2)

## 2024-07-24 LAB — CMP (CANCER CENTER ONLY)
ALT: 34 U/L (ref 0–44)
AST: 32 U/L (ref 15–41)
Albumin: 4.3 g/dL (ref 3.5–5.0)
Alkaline Phosphatase: 66 U/L (ref 38–126)
Anion gap: 12 (ref 5–15)
BUN: 17 mg/dL (ref 8–23)
CO2: 23 mmol/L (ref 22–32)
Calcium: 9.9 mg/dL (ref 8.9–10.3)
Chloride: 105 mmol/L (ref 98–111)
Creatinine: 1.16 mg/dL — ABNORMAL HIGH (ref 0.44–1.00)
GFR, Estimated: 51 mL/min — ABNORMAL LOW (ref >60)
Glucose, Bld: 152 mg/dL — ABNORMAL HIGH (ref 70–99)
Potassium: 4.5 mmol/L (ref 3.5–5.1)
Sodium: 141 mmol/L (ref 135–145)
Total Bilirubin: 0.4 mg/dL (ref 0.0–1.2)
Total Protein: 6.6 g/dL (ref 6.5–8.1)

## 2024-07-24 LAB — SAVE SMEAR(SSMR), FOR PROVIDER SLIDE REVIEW

## 2024-07-24 LAB — IRON AND IRON BINDING CAPACITY (CC-WL,HP ONLY)
Iron: 76 ug/dL (ref 28–170)
Saturation Ratios: 17 % (ref 10.4–31.8)
TIBC: 438 ug/dL (ref 250–450)
UIBC: 362 ug/dL

## 2024-07-24 LAB — FERRITIN: Ferritin: 21 ng/mL (ref 11–307)

## 2024-07-24 LAB — LACTATE DEHYDROGENASE: LDH: 422 U/L — ABNORMAL HIGH (ref 105–235)

## 2024-07-24 NOTE — Progress Notes (Signed)
 Hematology and Oncology Follow Up Visit  [name] [id number] 1955/08/11 68 y.o. 07/24/2024   Principle Diagnosis:  Polycythemia vera, JAK 2 positive    Current Therapy:        Phlebotomy as indicated to maintain Hct < 45%    Aspirin  81 mg p.o. daily    Jakifi 5 mg po BID -- started on 11/28/2023 - decreased to 5 mg PO daily on 01/25/2024   Interim History:  [name] is here today with her husband for follow-up.  We last saw her back in October.  Since then, [name] been doing pretty well.  [name] had a very nice Thanksgiving.  [name] will be looking forward to a big Christmas with her family.  [name] has had no issues with the polycythemia.  [name] says that her vision does get a bit blurred.  I would not think this to be from polycythemia since we are controlling her hematocrit quite well.  [name] is on aspirin .  [name] is doing Jakafi .  I know that her blood sugars are on the higher side so we could certainly look at her blood sugars as the issue.  [name] has had no change in bowel or bladder habits.  There is been no rashes.  [name] has had no fever.  Patient does have a cough.  This is a little bit of a productive cough.  [name] has seen her family doctor for this.  Of note, when we last saw her, we did do a ultrasound of her spleen.  Her spleen is decreased in size.  Her spleen measures 723 cm.  Prior, it measured 950 cm.  Overall, I would say that her performance status is probably ECOG 1.   Wt Readings from Last 3 Encounters:  07/24/24 232 lb (105.2 kg)  06/05/24 229 lb 0.6 oz (103.9 kg)  04/11/24 222 lb (100.7 kg)     Medications:  Allergies as of 07/24/2024       Reactions   Ambien  [zolpidem ] Other (See Comments)   Sleep walking   Percocet [oxycodone -acetaminophen ] Other (See Comments)   SYNCOPE TACHYCARDIA   Shrimp [shellfish Allergy] Anaphylaxis, Itching, Nausea And Vomiting, Swelling   Hydrocodone -acetaminophen  Nausea And Vomiting, Other (See Comments)   Dizziness, also    Lipitor [atorvastatin ] Other (See Comments)   Ear pain/fullness in ear as if I were under water    Saxenda  [liraglutide  -weight Management] Hives, Itching, Other (See Comments)   Hives/itching/site reaction   Adhesive [tape] Rash, Other (See Comments)   EKG leads and certain bandages break out the skin   Belviq Xr [lorcaserin  Hcl Er] Other (See Comments)   Gained weight    Ferrous Sulfate  Nausea Only   Morphine  And Codeine Nausea And Vomiting        Medication List        Accurate as of July 24, 2024  1:43 PM. If you have any questions, ask your nurse or doctor.          acetaminophen  325 MG tablet Commonly known as: TYLENOL  Take 325-650 mg by mouth every 6 (six) hours as needed for mild pain or headache.   albuterol  108 (90 Base) MCG/ACT inhaler Commonly known as: VENTOLIN  HFA Inhale 2 puffs into the lungs every 6 (six) hours as needed for wheezing or shortness of breath.   Aspirin  81 MG Caps Take 81 mg by mouth daily at 6 (six) AM.   B-12 PO Take 1 tablet by mouth daily.   calcium  carbonate 500 MG chewable tablet  Commonly known as: TUMS - dosed in mg elemental calcium  Chew 1 tablet by mouth as needed for indigestion or heartburn.   cetirizine  5 MG tablet Commonly known as: ZYRTEC  Take 10 mg by mouth daily.   cholecalciferol  25 MCG (1000 UNIT) tablet Commonly known as: VITAMIN D3 Take 1,000 Units by mouth daily.   citalopram  20 MG tablet Commonly known as: CELEXA  TAKE 1 TABLET DAILY What changed:  how much to take how to take this when to take this additional instructions   levothyroxine  125 MCG tablet Commonly known as: SYNTHROID  TAKE 1 TABLET BY MOUTH EVERY DAY BEFORE BREAKFAST   lisinopril  5 MG tablet Commonly known as: ZESTRIL  TAKE 1 TABLET (5 MG TOTAL) BY MOUTH DAILY.   LUBRICATING EYE DROPS OP Place 1 drop into both eyes as needed (dry eyes).   methocarbamol  500 MG tablet Commonly known as: ROBAXIN  TAKE 1 TABLET BY MOUTH THREE TIMES  A DAY   metoprolol  succinate 25 MG 24 hr tablet Commonly known as: TOPROL -XL TAKE 1 TABLET (25 MG TOTAL) BY MOUTH DAILY.   pantoprazole  40 MG tablet Commonly known as: PROTONIX  TAKE 1 TABLET BY MOUTH EVERY DAY   promethazine  25 MG tablet Commonly known as: PHENERGAN  TAKE 1 TABLET (25 MG TOTAL) BY MOUTH AS NEEDED.   promethazine -dextromethorphan 6.25-15 MG/5ML syrup Commonly known as: PROMETHAZINE -DM TAKE 5MLS BY MOUTH 4 TIMES A DAY AS NEEDED   rosuvastatin  10 MG tablet Commonly known as: CRESTOR  TAKE 1 TABLET BY MOUTH EVERY DAY   ruxolitinib  phosphate 5 MG tablet Commonly known as: JAKAFI  Take 1 tablet (5 mg total) by mouth 2 (two) times daily. What changed: when to take this   traZODone  50 MG tablet Commonly known as: DESYREL  Take 0.5-1 tablets (25-50 mg total) by mouth at bedtime.        Allergies:  Allergies  Allergen Reactions   Ambien  [Zolpidem ] Other (See Comments)    Sleep walking   Percocet [Oxycodone -Acetaminophen ] Other (See Comments)    SYNCOPE TACHYCARDIA   Shrimp [Shellfish Allergy] Anaphylaxis, Itching, Nausea And Vomiting and Swelling   Hydrocodone -Acetaminophen  Nausea And Vomiting and Other (See Comments)    Dizziness, also   Lipitor [Atorvastatin ] Other (See Comments)    Ear pain/fullness in ear as if I were under water    Saxenda  [Liraglutide  -Weight Management] Hives, Itching and Other (See Comments)    Hives/itching/site reaction   Adhesive [Tape] Rash and Other (See Comments)    EKG leads and certain bandages break out the skin   Belviq Xr [Lorcaserin  Hcl Er] Other (See Comments)    Gained weight    Ferrous Sulfate  Nausea Only   Morphine  And Codeine Nausea And Vomiting    Past Medical History, Surgical history, Social history, and Family History were reviewed and updated.  Review of Systems:  Review of Systems  Constitutional: Negative.   HENT: Negative.    Eyes: Negative.   Respiratory:  Positive for cough.   Cardiovascular:  Negative.   Gastrointestinal: Negative.   Genitourinary: Negative.   Musculoskeletal: Negative.   Skin: Negative.   Neurological: Negative.   Endo/Heme/Allergies: Negative.   Psychiatric/Behavioral: Negative.       Physical Exam:  height is 5' 4 (1.626 m) and weight is 232 lb (105.2 kg). Her oral temperature is 97.8 F (36.6 C). Her blood pressure is 120/57 (abnormal) and her pulse is 56 (abnormal). Her respiration is 20 and oxygen saturation is 99%.   Wt Readings from Last 3 Encounters:  07/24/24 232 lb (105.2 kg)  06/05/24 229 lb 0.6 oz (103.9 kg)  04/11/24 222 lb (100.7 kg)    Physical Exam Vitals reviewed.  HENT:     Head: Normocephalic and atraumatic.  Eyes:     Pupils: Pupils are equal, round, and reactive to light.  Cardiovascular:     Rate and Rhythm: Normal rate and regular rhythm.     Heart sounds: Normal heart sounds.  Pulmonary:     Effort: Pulmonary effort is normal.     Breath sounds: Normal breath sounds.  Abdominal:     General: Bowel sounds are normal.     Palpations: Abdomen is soft.  Musculoskeletal:        General: No tenderness or deformity. Normal range of motion.     Cervical back: Normal range of motion.  Lymphadenopathy:     Cervical: No cervical adenopathy.  Skin:    General: Skin is warm and dry.     Findings: No erythema or rash.  Neurological:     Mental Status: [name] is alert and oriented to person, place, and time.  Psychiatric:        Behavior: Behavior normal.        Thought Content: Thought content normal.        Judgment: Judgment normal.      Lab Results  Component Value Date   WBC 7.0 07/24/2024   HGB 12.6 07/24/2024   HCT 41.0 07/24/2024   MCV 78.1 (L) 07/24/2024   PLT 330 07/24/2024   Lab Results  Component Value Date   FERRITIN 17 06/05/2024   IRON  63 06/05/2024   TIBC 540 (H) 06/05/2024   UIBC 477 06/05/2024   IRONPCTSAT 12 06/05/2024   Lab Results  Component Value Date   RETICCTPCT 2.0 06/05/2024   RBC  5.25 (H) 07/24/2024   No results found for: KPAFRELGTCHN, LAMBDASER, KAPLAMBRATIO No results found for: IGGSERUM, IGA, IGMSERUM No results found for: STEPHANY CARLOTA BENSON MARKEL EARLA JOANNIE DOC VICK, SPEI   Chemistry      Component Value Date/Time   NA 141 07/24/2024 1226   K 4.5 07/24/2024 1226   CL 105 07/24/2024 1226   CO2 23 07/24/2024 1226   BUN 17 07/24/2024 1226   CREATININE 1.16 (H) 07/24/2024 1226   CREATININE 1.18 (H) 12/11/2021 1221      Component Value Date/Time   CALCIUM  9.9 07/24/2024 1226   ALKPHOS 66 07/24/2024 1226   AST 32 07/24/2024 1226   ALT 34 07/24/2024 1226   BILITOT 0.4 07/24/2024 1226       Impression and Plan: [name] is a very pleasant 68 yo caucasian female with polycythemia, JAK 2 positive. [name] is now on Jakifi PO daily.   I really think [name] is doing well.  Her blood counts look great.  I hate that [name] has is cough.  I am not sure if this might be from one of her medications.  [name] is on lisinopril  although [name] has been taking lisinopril  for years.  We will plan to get her back in 2 months now.  I do not think we need a another ultrasound on her part for about 6 months.  [name] really has had a nice year.  I know that [name] will enjoy the Christmas and New Year's holiday.   Maude JONELLE Crease, MD 12/15/20251:43 PM

## 2024-08-04 ENCOUNTER — Encounter: Payer: Self-pay | Admitting: Family

## 2024-08-08 ENCOUNTER — Ambulatory Visit: Payer: Self-pay

## 2024-08-08 ENCOUNTER — Ambulatory Visit: Admitting: Medical-Surgical

## 2024-08-08 ENCOUNTER — Ambulatory Visit

## 2024-08-08 ENCOUNTER — Encounter: Payer: Self-pay | Admitting: Family

## 2024-08-08 ENCOUNTER — Ambulatory Visit: Payer: Self-pay | Admitting: Medical-Surgical

## 2024-08-08 DIAGNOSIS — J069 Acute upper respiratory infection, unspecified: Secondary | ICD-10-CM

## 2024-08-08 DIAGNOSIS — R059 Cough, unspecified: Secondary | ICD-10-CM

## 2024-08-08 LAB — POC COVID19/FLU A&B COMBO
Covid Antigen, POC: NEGATIVE
Influenza A Antigen, POC: POSITIVE — AB
Influenza B Antigen, POC: NEGATIVE

## 2024-08-08 MED ORDER — OSELTAMIVIR PHOSPHATE 30 MG PO CAPS: 30.0000 mg | ORAL_CAPSULE | Freq: Two times a day (BID) | ORAL | 0 refills | Status: DC

## 2024-08-08 MED ORDER — AIRSUPRA 90-80 MCG/ACT IN AERO: 2.0000 | INHALATION_SPRAY | RESPIRATORY_TRACT | 11 refills | Status: AC | PRN

## 2024-08-08 MED ORDER — PREDNISONE 20 MG PO TABS: ORAL_TABLET | ORAL | 0 refills | Status: DC

## 2024-08-08 MED ORDER — PROMETHAZINE-DM 6.25-15 MG/5ML PO SYRP: ORAL_SOLUTION | ORAL | 0 refills | Status: DC

## 2024-08-08 MED ORDER — AIRSUPRA 90-80 MCG/ACT IN AERO: 2.0000 | INHALATION_SPRAY | RESPIRATORY_TRACT | Status: DC | PRN

## 2024-08-08 NOTE — Telephone Encounter (Signed)
 FYI Only or Action Required?: Action required by provider: Refusing ED, requesting call back, alerted [facility] to ED refusal.  Patient was last seen in primary care on 04/11/2024 by Antoniette Vermell CROME, PA-C.  Called Nurse Triage reporting Cough.  Symptoms began worsening one week ago.  Interventions attempted: Rest, hydration, or home remedies and Other: x-rays for pneumonia in past few weeks.  Symptoms are: gradually worsening.  Triage Disposition: Go to ED Now (Notify PCP)  Patient/caregiver understands and will follow disposition?: No, wishes to speak with PCP    Copied from CRM #[id number]. Topic: Clinical - Red Word Triage >> Aug 08, 2024  8:45 AM Montie POUR wrote: Red Word that prompted transfer to Nurse Triage:  For the last 3 months she has a cough. Now cough is getting worse,over last week, she now has low grade fever, body aches, tired.  Her fever was at 100 point something. Reason for Disposition  [1] MODERATE difficulty breathing (e.g., speaks in phrases, SOB even at rest, pulse 100-120) AND [2] still present when not coughing  Answer Assessment - Initial Assessment Questions This RN recommended pt be examined in ED for symptoms, pt husband refusing at this time, requesting call back from office to see if need ED, prefers appt with Jade if possible. Advised call 911 if worsening symptoms. Alerted [facility] to ED refusal.    Some SOB occasionally over the last week as well So congested Had pneumonia Follow up x-ray 2-3 weeks ago showed resolving Cough was getting better and now worse and worse No chest pain Fever 100.something Chills last night Body aches, fever, chills started last night Shaking with chills Sometimes coughs something up SOB can't catch her breath 96% pulse ox HR 81 bpm high for her because she takes beta blocker so HR normally around 50 bpm Struggling to breathe at times but not currently Unsure if heart racing Immunity comprised Hate to take her into the  ED Would rather take her to see Vermell Smoke go if that's what they think she should do  Protocols used: Cough - Acute Productive-A-AH

## 2024-08-08 NOTE — Telephone Encounter (Signed)
 Patient scheduled today at 4:20pm with Joy Jessup,NP

## 2024-08-08 NOTE — Progress Notes (Unsigned)
" ° °       Established patient visit   History of Present Illness   Discussed the use of AI scribe software for clinical note transcription with the patient, who gave verbal consent to proceed.  History of Present Illness   [name] is a 68 year old female who presents with persistent cough and sinus pressure.  Cough and respiratory symptoms - Persistent productive cough since late September, initially treated as pneumonia - Cough has not resolved and has recently worsened - Mucus production with cough - Associated wheezing and sore throat - Occasional use of inhaler - Home nebulizer with albuterol  solution available but not used recently - Ran out of cough syrup, which previously helped with sleep  Sinus congestion and pressure - Sinus congestion and pressure present since late September - Associated with sore throat  Fever - Fever occurred yesterday, improved with Tylenol   Recent viral exposure and testing - One daughter recently had influenza A; patient suspects illness may be related - Previously lost sense of taste and smell - Tested negative for COVID-19 and influenza A at that time - No current testing for flu or COVID-19  Gastrointestinal and ocular symptoms - No stomach pain, nausea, vomiting, diarrhea, or eye drainage      Physical Exam   Physical Exam Assessment & Plan   Problem List Items Addressed This Visit   None Visit Diagnoses       Upper respiratory tract infection, unspecified type       Relevant Medications   promethazine -dextromethorphan (PROMETHAZINE -DM) 6.25-15 MG/5ML syrup   Other Relevant Orders   DG Chest 2 View   POC Covid19/Flu A&B Antigen (Completed)      Assessment and Plan    Acute upper respiratory infection Persistent cough and sinus pressure post-pneumonia. Recent fever and worsening cough suggest viral etiology, possibly influenza A. No antibiotics due to recent steroid use and absence of bacterial signs. - Ordered  chest x-ray to assess lung condition. - Prescribed promethazine  DM for cough. - Advised nebulizer with albuterol . - Prescribed Air Supra inhaler for bronchodilation and inflammation reduction. - Provided Air Supra samples if available. - Monitor symptoms; consider antibiotics if condition worsens.        Follow up   Return if symptoms worsen or fail to improve. __________________________________ Zada FREDRIK Palin, DNP, APRN, FNP-BC Primary Care and Sports Medicine [facility] [place] "

## 2024-08-11 ENCOUNTER — Ambulatory Visit: Admitting: Physician Assistant

## 2024-08-15 ENCOUNTER — Ambulatory Visit: Admitting: Physician Assistant

## 2024-08-15 ENCOUNTER — Other Ambulatory Visit: Payer: Self-pay | Admitting: Physician Assistant

## 2024-08-15 VITALS — BP 130/70 | HR 56 | Ht 64.0 in | Wt 233.0 lb

## 2024-08-15 DIAGNOSIS — E039 Hypothyroidism, unspecified: Secondary | ICD-10-CM | POA: Diagnosis not present

## 2024-08-15 DIAGNOSIS — G4733 Obstructive sleep apnea (adult) (pediatric): Secondary | ICD-10-CM | POA: Diagnosis not present

## 2024-08-15 DIAGNOSIS — R400 Somnolence: Secondary | ICD-10-CM

## 2024-08-15 DIAGNOSIS — Z6839 Body mass index (BMI) 39.0-39.9, adult: Secondary | ICD-10-CM | POA: Diagnosis not present

## 2024-08-15 DIAGNOSIS — E782 Mixed hyperlipidemia: Secondary | ICD-10-CM

## 2024-08-15 DIAGNOSIS — E6609 Other obesity due to excess calories: Secondary | ICD-10-CM

## 2024-08-15 DIAGNOSIS — I10 Essential (primary) hypertension: Secondary | ICD-10-CM | POA: Diagnosis not present

## 2024-08-15 DIAGNOSIS — E66812 Obesity, class 2: Secondary | ICD-10-CM | POA: Diagnosis not present

## 2024-08-15 DIAGNOSIS — J189 Pneumonia, unspecified organism: Secondary | ICD-10-CM

## 2024-08-15 DIAGNOSIS — R7303 Prediabetes: Secondary | ICD-10-CM

## 2024-08-15 DIAGNOSIS — R030 Elevated blood-pressure reading, without diagnosis of hypertension: Secondary | ICD-10-CM | POA: Diagnosis not present

## 2024-08-15 DIAGNOSIS — E559 Vitamin D deficiency, unspecified: Secondary | ICD-10-CM

## 2024-08-15 MED ORDER — BREZTRI AEROSPHERE 160-9-4.8 MCG/ACT IN AERO: 2.0000 | INHALATION_SPRAY | Freq: Two times a day (BID) | RESPIRATORY_TRACT | Status: DC

## 2024-08-15 MED ORDER — IPRATROPIUM-ALBUTEROL 0.5-2.5 (3) MG/3ML IN SOLN: 3.0000 mL | RESPIRATORY_TRACT | 0 refills | Status: AC | PRN

## 2024-08-15 MED ORDER — ZEPBOUND 2.5 MG/0.5ML ~~LOC~~ SOAJ: 2.5000 mg | SUBCUTANEOUS | 0 refills | Status: DC

## 2024-08-15 MED ORDER — LEVOFLOXACIN 750 MG PO TABS: 750.0000 mg | ORAL_TABLET | Freq: Every day | ORAL | 0 refills | Status: AC

## 2024-08-15 NOTE — Progress Notes (Signed)
 "  Established Patient Office Visit  Subjective   Patient ID: [name], female    DOB: [birth year]  Age: 69 y.o. MRN: [id number]  Chief Complaint  Patient presents with   Medical Management of Chronic Issues    HPI Patient is a 69 yo obese female with recent pneumonia and now persistent cough and congestion who presents to the clinic to be evaluated. She is accompanied by her husband.   She has not felt 100 percent since she had covid infection with secondary pneumonia back in the summer. She was seen 12/30 and diagnosed with flu. She never heard back from chest xray. She was started on Airsupra  and prednisone . She does feel some better but continues to cough and have lots of congestion and chest tightness. She uses duoneb but has been out of solution. She was on Breztri  once before but not used in many months.   She is very frustrated with her weight. She has family members on zepbound  for weight loss and wonders if that is something that she can try. She uses a CPAP machine nightly. She continues to struggle with daytime fatigue.   ROS See HPI.    Objective:     BP 130/70   Pulse (!) 56   Ht 5' 4 (1.626 m)   Wt 233 lb (105.7 kg)   SpO2 96%   BMI 39.99 kg/m  BP Readings from Last 3 Encounters:  08/15/24 130/70  08/08/24 113/72  07/24/24 (!) 120/57   Wt Readings from Last 3 Encounters:  08/15/24 233 lb (105.7 kg)  08/08/24 231 lb 1.3 oz (104.8 kg)  07/24/24 232 lb (105.2 kg)      Physical Exam Constitutional:      Appearance: Normal appearance. She is obese.  HENT:     Head: Normocephalic.     Right Ear: Tympanic membrane, ear canal and external ear normal. There is no impacted cerumen.     Left Ear: Tympanic membrane, ear canal and external ear normal. There is no impacted cerumen.     Nose: Nose normal. No congestion or rhinorrhea.     Mouth/Throat:     Mouth: Mucous membranes are moist.     Pharynx: Posterior oropharyngeal erythema present. No  oropharyngeal exudate.  Eyes:     Conjunctiva/sclera: Conjunctivae normal.  Neck:     Vascular: No carotid bruit.  Cardiovascular:     Rate and Rhythm: Normal rate.  Pulmonary:     Comments: Productive cough with bilateral lung wheezing and rhonchi.  Musculoskeletal:     Cervical back: Tenderness present.     Right lower leg: No edema.     Left lower leg: No edema.  Lymphadenopathy:     Cervical: Cervical adenopathy present.  Neurological:     General: No focal deficit present.     Mental Status: She is alert and oriented to person, place, and time.  Psychiatric:        Mood and Affect: Mood normal.       The 10-year ASCVD risk score (Arnett DK, et al., 2019) is: 11.5%    Assessment & Plan:  .[name] was seen today for medical management of chronic issues.  Diagnoses and all orders for this visit:  Pneumonia of both lower lobes due to infectious organism -     ipratropium-albuterol  (DUONEB) 0.5-2.5 (3) MG/3ML SOLN; Take 3 mLs by nebulization every 2 (two) hours as needed (wheeze, SOB). -     levofloxacin  (LEVAQUIN ) 750 MG tablet; Take 1  tablet (750 mg total) by mouth daily.  Elevated blood pressure reading  Class 2 obesity due to excess calories without serious comorbidity with body mass index (BMI) of 39.0 to 39.9 in adult -     Hemoglobin A1c -     CMP14+EGFR -     Lipid panel -     TSH + free T4 -     VITAMIN D  25 Hydroxy (Vit-D Deficiency, Fractures) -     tirzepatide  (ZEPBOUND ) 2.5 MG/0.5ML Pen; Inject 2.5 mg into the skin once a week.  Hypothyroidism, unspecified type -     TSH + free T4  Mixed hyperlipidemia -     Lipid panel  Essential hypertension, benign -     CMP14+EGFR  Vitamin D  deficiency -     VITAMIN D  25 Hydroxy (Vit-D Deficiency, Fractures)  OSA on CPAP -     tirzepatide  (ZEPBOUND ) 2.5 MG/0.5ML Pen; Inject 2.5 mg into the skin once a week.  Daytime sleepiness  Pre-diabetes -     Hemoglobin A1c  Recurrent pneumonia -      budesonide-glycopyrrolate-formoterol (BREZTRI  AEROSPHERE) 160-9-4.8 MCG/ACT AERO inhaler; Inhale 2 puffs into the lungs 2 (two) times daily.   Called radiology and had them read chest xray. Cannot rule out bilateral lower lobe pneumonia.  Start levaquin .  Continue duoneb every 4-6 hours as needed.  Finish prednisone  taper.  Start Breztri  2 puffs twice a day Follow up in 4 weeks.   Fasting labs ordered for follow up and management.   Obesity with OSA - Patient would be a great candidate for zepbound  for weight loss.  - Discussed side effects and how to taper up.  - Encouraged to use with exercise and diet changes.  - Start zepbound  2.5mg  weekly.  - Follow up in 4 weeks.   Tanya Fletchall, PA-C  "

## 2024-08-15 NOTE — Patient Instructions (Signed)
 Start levaquin  for 5 days Duoneb every 4-6 hours as needed Start Breztri  2 puffs twice a day  Start zepbound  for weight loss

## 2024-08-16 ENCOUNTER — Other Ambulatory Visit: Payer: Self-pay | Admitting: Physician Assistant

## 2024-08-16 DIAGNOSIS — E66812 Obesity, class 2: Secondary | ICD-10-CM

## 2024-08-16 DIAGNOSIS — G4733 Obstructive sleep apnea (adult) (pediatric): Secondary | ICD-10-CM

## 2024-08-18 ENCOUNTER — Encounter: Payer: Self-pay | Admitting: Physician Assistant

## 2024-08-22 ENCOUNTER — Other Ambulatory Visit: Payer: Self-pay | Admitting: Physician Assistant

## 2024-08-22 DIAGNOSIS — E66812 Obesity, class 2: Secondary | ICD-10-CM

## 2024-08-22 DIAGNOSIS — G4733 Obstructive sleep apnea (adult) (pediatric): Secondary | ICD-10-CM

## 2024-08-23 ENCOUNTER — Telehealth: Payer: Self-pay

## 2024-08-23 ENCOUNTER — Other Ambulatory Visit (HOSPITAL_COMMUNITY): Payer: Self-pay

## 2024-08-23 NOTE — Telephone Encounter (Signed)
 Pharmacy Patient Advocate Encounter  Received notification from [employer] that Prior Authorization for Zepbound  2.5mg /0.69ml has been CANCELLED due to this medication is excluded from the patient's benefit.

## 2024-08-23 NOTE — Telephone Encounter (Signed)
 Pharmacy Patient Advocate Encounter   Received notification from RX Request Messages that prior authorization for Zepbound  2.5mg /0.28ml is required/requested.   Insurance verification completed.   The patient is insured through [employer].   Per test claim: PA required; PA submitted to above mentioned insurance via Latent Key/confirmation #/EOC [id number] Status is pending

## 2024-08-24 ENCOUNTER — Other Ambulatory Visit: Payer: Self-pay | Admitting: Physician Assistant

## 2024-08-24 DIAGNOSIS — I10 Essential (primary) hypertension: Secondary | ICD-10-CM

## 2024-08-24 DIAGNOSIS — F411 Generalized anxiety disorder: Secondary | ICD-10-CM

## 2024-08-24 DIAGNOSIS — E039 Hypothyroidism, unspecified: Secondary | ICD-10-CM

## 2024-08-24 DIAGNOSIS — K2100 Gastro-esophageal reflux disease with esophagitis, without bleeding: Secondary | ICD-10-CM

## 2024-08-25 ENCOUNTER — Ambulatory Visit: Payer: Self-pay | Admitting: Physician Assistant

## 2024-08-25 LAB — CMP14+EGFR
ALT: 25 IU/L (ref 0–32)
AST: 28 IU/L (ref 0–40)
Albumin: 4.2 g/dL (ref 3.9–4.9)
Alkaline Phosphatase: 63 IU/L (ref 49–135)
BUN/Creatinine Ratio: 15 (ref 12–28)
BUN: 20 mg/dL (ref 8–27)
Bilirubin Total: 0.5 mg/dL (ref 0.0–1.2)
CO2: 22 mmol/L (ref 20–29)
Calcium: 10.2 mg/dL (ref 8.7–10.3)
Chloride: 105 mmol/L (ref 96–106)
Creatinine, Ser: 1.33 mg/dL — ABNORMAL HIGH (ref 0.57–1.00)
Globulin, Total: 1.9 g/dL (ref 1.5–4.5)
Glucose: 111 mg/dL — ABNORMAL HIGH (ref 70–99)
Potassium: 5.2 mmol/L (ref 3.5–5.2)
Sodium: 141 mmol/L (ref 134–144)
Total Protein: 6.1 g/dL (ref 6.0–8.5)
eGFR: 43 mL/min/1.73 — ABNORMAL LOW

## 2024-08-25 LAB — LIPID PANEL
Chol/HDL Ratio: 2.7 ratio (ref 0.0–4.4)
Cholesterol, Total: 185 mg/dL (ref 100–199)
HDL: 68 mg/dL
LDL Chol Calc (NIH): 90 mg/dL (ref 0–99)
Triglycerides: 158 mg/dL — ABNORMAL HIGH (ref 0–149)
VLDL Cholesterol Cal: 27 mg/dL (ref 5–40)

## 2024-08-25 LAB — TSH+FREE T4
Free T4: 1.54 ng/dL (ref 0.82–1.77)
TSH: 1.01 u[IU]/mL (ref 0.450–4.500)

## 2024-08-25 LAB — HEMOGLOBIN A1C
Est. average glucose Bld gHb Est-mCnc: 137 mg/dL
Hgb A1c MFr Bld: 6.4 % — ABNORMAL HIGH (ref 4.8–5.6)

## 2024-08-25 LAB — VITAMIN D 25 HYDROXY (VIT D DEFICIENCY, FRACTURES): Vit D, 25-Hydroxy: 25.4 ng/mL — ABNORMAL LOW (ref 30.0–100.0)

## 2024-08-25 NOTE — Progress Notes (Signed)
 [name],   Thyroid  looks good.  Vitamin D  low. Increase by 1000 units daily and take with dairy to help with absorption.  LDL, bad cholesterol, looks great.  HDL, good cholesterol, looks good.  TG slightly elevated.  Kidney's look a little dry. Make sure drinking enough water .  Pre-diabetes but .1unit away from diabetes

## 2024-08-29 MED ORDER — TIRZEPATIDE-WEIGHT MANAGEMENT 2.5 MG/0.5ML ~~LOC~~ SOLN: 2.5000 mg | SUBCUTANEOUS | 0 refills | Status: AC

## 2024-08-29 MED ORDER — TIRZEPATIDE-WEIGHT MANAGEMENT 5 MG/0.5ML ~~LOC~~ SOLN: 5.0000 mg | SUBCUTANEOUS | 1 refills | Status: AC

## 2024-09-01 ENCOUNTER — Other Ambulatory Visit: Payer: Self-pay | Admitting: Physician Assistant

## 2024-09-01 DIAGNOSIS — I493 Ventricular premature depolarization: Secondary | ICD-10-CM

## 2024-09-08 ENCOUNTER — Encounter: Payer: Self-pay | Admitting: Family

## 2024-09-12 ENCOUNTER — Encounter: Payer: Self-pay | Admitting: Physician Assistant

## 2024-09-12 ENCOUNTER — Other Ambulatory Visit: Payer: Self-pay | Admitting: Physician Assistant

## 2024-09-12 ENCOUNTER — Ambulatory Visit: Admitting: Physician Assistant

## 2024-09-12 VITALS — BP 122/78 | HR 88 | Ht 64.0 in | Wt 228.0 lb

## 2024-09-12 DIAGNOSIS — R0981 Nasal congestion: Secondary | ICD-10-CM | POA: Insufficient documentation

## 2024-09-12 DIAGNOSIS — F411 Generalized anxiety disorder: Secondary | ICD-10-CM

## 2024-09-12 DIAGNOSIS — E039 Hypothyroidism, unspecified: Secondary | ICD-10-CM

## 2024-09-12 DIAGNOSIS — J189 Pneumonia, unspecified organism: Secondary | ICD-10-CM | POA: Insufficient documentation

## 2024-09-12 DIAGNOSIS — E6609 Other obesity due to excess calories: Secondary | ICD-10-CM

## 2024-09-12 DIAGNOSIS — I10 Essential (primary) hypertension: Secondary | ICD-10-CM

## 2024-09-12 DIAGNOSIS — J320 Chronic maxillary sinusitis: Secondary | ICD-10-CM | POA: Insufficient documentation

## 2024-09-12 DIAGNOSIS — I493 Ventricular premature depolarization: Secondary | ICD-10-CM

## 2024-09-12 MED ORDER — FLUTICASONE PROPIONATE 50 MCG/ACT NA SUSP: 2.0000 | Freq: Every day | NASAL | 0 refills | Status: AC

## 2024-09-12 MED ORDER — LISINOPRIL 5 MG PO TABS: 5.0000 mg | ORAL_TABLET | Freq: Every day | ORAL | 1 refills | Status: AC

## 2024-09-12 MED ORDER — CITALOPRAM HYDROBROMIDE 20 MG PO TABS: 10.0000 mg | ORAL_TABLET | Freq: Every day | ORAL | 1 refills | Status: DC

## 2024-09-12 MED ORDER — LEVOTHYROXINE SODIUM 125 MCG PO TABS: 125.0000 ug | ORAL_TABLET | Freq: Every day | ORAL | 1 refills | Status: AC

## 2024-09-12 MED ORDER — BREZTRI AEROSPHERE 160-9-4.8 MCG/ACT IN AERO: 2.0000 | INHALATION_SPRAY | Freq: Two times a day (BID) | RESPIRATORY_TRACT | 5 refills | Status: AC

## 2024-09-25 ENCOUNTER — Inpatient Hospital Stay

## 2024-09-25 ENCOUNTER — Inpatient Hospital Stay: Attending: Hematology & Oncology

## 2024-09-25 ENCOUNTER — Inpatient Hospital Stay: Admitting: Hematology & Oncology

## 2024-12-05 ENCOUNTER — Ambulatory Visit

## 2024-12-11 ENCOUNTER — Ambulatory Visit: Admitting: Physician Assistant
